# Patient Record
Sex: Male | Born: 1965
Health system: Southern US, Community
[De-identification: ages and names within clinical notes are randomized; demographics above are authoritative.]

## PROBLEM LIST (undated history)

## (undated) DIAGNOSIS — J449 Chronic obstructive pulmonary disease, unspecified: Secondary | ICD-10-CM

## (undated) DIAGNOSIS — I499 Cardiac arrhythmia, unspecified: Secondary | ICD-10-CM

## (undated) DIAGNOSIS — I10 Essential (primary) hypertension: Secondary | ICD-10-CM

## (undated) DIAGNOSIS — M545 Low back pain, unspecified: Secondary | ICD-10-CM

## (undated) DIAGNOSIS — E669 Obesity, unspecified: Secondary | ICD-10-CM

## (undated) DIAGNOSIS — N200 Calculus of kidney: Secondary | ICD-10-CM

## (undated) DIAGNOSIS — K219 Gastro-esophageal reflux disease without esophagitis: Secondary | ICD-10-CM

## (undated) DIAGNOSIS — M1990 Unspecified osteoarthritis, unspecified site: Secondary | ICD-10-CM

## (undated) DIAGNOSIS — E785 Hyperlipidemia, unspecified: Secondary | ICD-10-CM

## (undated) HISTORY — DX: Unspecified osteoarthritis, unspecified site: M19.90

## (undated) HISTORY — DX: Low back pain: M54.5

## (undated) HISTORY — DX: Cardiac arrhythmia, unspecified: I49.9

## (undated) HISTORY — PX: KNEE SURGERY: SHX244

## (undated) HISTORY — DX: Obesity, unspecified: E66.9

## (undated) HISTORY — DX: Gastro-esophageal reflux disease without esophagitis: K21.9

## (undated) HISTORY — DX: Calculus of kidney: N20.0

## (undated) HISTORY — DX: Hyperlipidemia, unspecified: E78.5

## (undated) HISTORY — DX: Essential (primary) hypertension: I10

## (undated) HISTORY — DX: Chronic obstructive pulmonary disease, unspecified: J44.9

## (undated) HISTORY — PX: OTHER SURGICAL HISTORY: SHX169

## (undated) HISTORY — DX: Low back pain, unspecified: M54.50

---

## 1999-03-01 ENCOUNTER — Emergency Department (HOSPITAL_COMMUNITY): Admission: EM | Admit: 1999-03-01 | Discharge: 1999-03-01 | Payer: Self-pay | Admitting: Emergency Medicine

## 2000-07-24 ENCOUNTER — Emergency Department (HOSPITAL_COMMUNITY): Admission: EM | Admit: 2000-07-24 | Discharge: 2000-07-24 | Payer: Self-pay | Admitting: Emergency Medicine

## 2000-07-24 ENCOUNTER — Encounter: Payer: Self-pay | Admitting: Emergency Medicine

## 2000-08-18 ENCOUNTER — Encounter: Payer: Self-pay | Admitting: Urology

## 2000-08-18 ENCOUNTER — Encounter: Admission: RE | Admit: 2000-08-18 | Discharge: 2000-08-18 | Payer: Self-pay | Admitting: Urology

## 2000-09-23 ENCOUNTER — Encounter: Payer: Self-pay | Admitting: Anesthesiology

## 2000-09-24 ENCOUNTER — Encounter: Payer: Self-pay | Admitting: Urology

## 2000-09-24 ENCOUNTER — Ambulatory Visit (HOSPITAL_COMMUNITY): Admission: RE | Admit: 2000-09-24 | Discharge: 2000-09-24 | Payer: Self-pay | Admitting: Urology

## 2000-09-27 ENCOUNTER — Encounter: Payer: Self-pay | Admitting: Urology

## 2000-09-27 ENCOUNTER — Ambulatory Visit (HOSPITAL_COMMUNITY): Admission: RE | Admit: 2000-09-27 | Discharge: 2000-09-27 | Payer: Self-pay | Admitting: Urology

## 2003-05-02 ENCOUNTER — Encounter
Admission: RE | Admit: 2003-05-02 | Discharge: 2003-07-31 | Payer: Self-pay | Admitting: Physical Medicine & Rehabilitation

## 2003-05-10 ENCOUNTER — Encounter: Payer: Self-pay | Admitting: Physical Medicine & Rehabilitation

## 2003-05-10 ENCOUNTER — Ambulatory Visit (HOSPITAL_COMMUNITY)
Admission: RE | Admit: 2003-05-10 | Discharge: 2003-05-10 | Payer: Self-pay | Admitting: Physical Medicine & Rehabilitation

## 2003-07-26 ENCOUNTER — Encounter
Admission: RE | Admit: 2003-07-26 | Discharge: 2003-10-24 | Payer: Self-pay | Admitting: Physical Medicine & Rehabilitation

## 2003-10-31 ENCOUNTER — Encounter
Admission: RE | Admit: 2003-10-31 | Discharge: 2004-01-29 | Payer: Self-pay | Admitting: Physical Medicine & Rehabilitation

## 2003-11-28 ENCOUNTER — Encounter
Admission: RE | Admit: 2003-11-28 | Discharge: 2004-02-26 | Payer: Self-pay | Admitting: Physical Medicine & Rehabilitation

## 2004-03-27 ENCOUNTER — Encounter
Admission: RE | Admit: 2004-03-27 | Discharge: 2004-05-10 | Payer: Self-pay | Admitting: Physical Medicine & Rehabilitation

## 2004-05-10 ENCOUNTER — Encounter
Admission: RE | Admit: 2004-05-10 | Discharge: 2004-07-15 | Payer: Self-pay | Admitting: Physical Medicine & Rehabilitation

## 2004-05-13 ENCOUNTER — Ambulatory Visit: Payer: Self-pay | Admitting: Physical Medicine & Rehabilitation

## 2004-07-15 ENCOUNTER — Encounter
Admission: RE | Admit: 2004-07-15 | Discharge: 2004-10-13 | Payer: Self-pay | Admitting: Physical Medicine & Rehabilitation

## 2004-07-16 ENCOUNTER — Ambulatory Visit: Payer: Self-pay | Admitting: Physical Medicine & Rehabilitation

## 2004-10-16 ENCOUNTER — Encounter
Admission: RE | Admit: 2004-10-16 | Discharge: 2004-11-27 | Payer: Self-pay | Admitting: Physical Medicine & Rehabilitation

## 2004-10-31 ENCOUNTER — Ambulatory Visit: Payer: Self-pay | Admitting: Physical Medicine & Rehabilitation

## 2004-11-27 ENCOUNTER — Encounter
Admission: RE | Admit: 2004-11-27 | Discharge: 2005-02-25 | Payer: Self-pay | Admitting: Physical Medicine & Rehabilitation

## 2005-02-21 ENCOUNTER — Encounter
Admission: RE | Admit: 2005-02-21 | Discharge: 2005-05-22 | Payer: Self-pay | Admitting: Physical Medicine & Rehabilitation

## 2005-03-10 ENCOUNTER — Ambulatory Visit: Payer: Self-pay | Admitting: Physical Medicine & Rehabilitation

## 2005-04-21 ENCOUNTER — Ambulatory Visit: Payer: Self-pay | Admitting: Physical Medicine & Rehabilitation

## 2005-06-02 ENCOUNTER — Emergency Department (HOSPITAL_COMMUNITY): Admission: EM | Admit: 2005-06-02 | Discharge: 2005-06-02 | Payer: Self-pay | Admitting: Emergency Medicine

## 2005-06-09 ENCOUNTER — Encounter: Admission: RE | Admit: 2005-06-09 | Discharge: 2005-06-09 | Payer: Self-pay | Admitting: Family Medicine

## 2005-06-12 ENCOUNTER — Inpatient Hospital Stay (HOSPITAL_COMMUNITY): Admission: EM | Admit: 2005-06-12 | Discharge: 2005-06-16 | Payer: Self-pay | Admitting: Emergency Medicine

## 2005-06-12 ENCOUNTER — Encounter: Admission: RE | Admit: 2005-06-12 | Discharge: 2005-06-12 | Payer: Self-pay | Admitting: Family Medicine

## 2005-06-13 ENCOUNTER — Ambulatory Visit: Payer: Self-pay | Admitting: Pulmonary Disease

## 2005-06-13 ENCOUNTER — Ambulatory Visit: Payer: Self-pay | Admitting: Internal Medicine

## 2005-06-13 ENCOUNTER — Encounter: Payer: Self-pay | Admitting: Internal Medicine

## 2005-07-21 ENCOUNTER — Ambulatory Visit: Payer: Self-pay | Admitting: Cardiology

## 2005-07-22 ENCOUNTER — Encounter: Admission: RE | Admit: 2005-07-22 | Discharge: 2005-07-22 | Payer: Self-pay

## 2005-07-30 ENCOUNTER — Ambulatory Visit: Payer: Self-pay

## 2005-07-31 ENCOUNTER — Ambulatory Visit: Payer: Self-pay | Admitting: Cardiology

## 2005-09-17 ENCOUNTER — Encounter
Admission: RE | Admit: 2005-09-17 | Discharge: 2005-12-16 | Payer: Self-pay | Admitting: Physical Medicine & Rehabilitation

## 2005-09-17 ENCOUNTER — Ambulatory Visit: Payer: Self-pay | Admitting: Physical Medicine & Rehabilitation

## 2005-10-27 ENCOUNTER — Ambulatory Visit: Payer: Self-pay | Admitting: Internal Medicine

## 2005-12-25 ENCOUNTER — Ambulatory Visit: Payer: Self-pay | Admitting: Internal Medicine

## 2006-02-20 ENCOUNTER — Ambulatory Visit: Payer: Self-pay | Admitting: Internal Medicine

## 2006-05-01 ENCOUNTER — Ambulatory Visit: Payer: Self-pay | Admitting: Internal Medicine

## 2006-05-06 ENCOUNTER — Ambulatory Visit: Payer: Self-pay | Admitting: Internal Medicine

## 2006-05-20 ENCOUNTER — Ambulatory Visit: Payer: Self-pay | Admitting: Internal Medicine

## 2006-05-25 ENCOUNTER — Emergency Department (HOSPITAL_COMMUNITY): Admission: EM | Admit: 2006-05-25 | Discharge: 2006-05-26 | Payer: Self-pay | Admitting: Emergency Medicine

## 2006-05-26 ENCOUNTER — Ambulatory Visit: Payer: Self-pay | Admitting: Gastroenterology

## 2006-06-01 ENCOUNTER — Ambulatory Visit: Payer: Self-pay | Admitting: Internal Medicine

## 2006-07-02 ENCOUNTER — Ambulatory Visit: Payer: Self-pay | Admitting: Internal Medicine

## 2006-07-06 ENCOUNTER — Ambulatory Visit: Payer: Self-pay | Admitting: Gastroenterology

## 2006-07-07 ENCOUNTER — Ambulatory Visit: Payer: Self-pay | Admitting: Gastroenterology

## 2006-07-08 ENCOUNTER — Ambulatory Visit: Payer: Self-pay | Admitting: Cardiology

## 2006-07-15 ENCOUNTER — Ambulatory Visit: Payer: Self-pay | Admitting: Gastroenterology

## 2006-07-17 ENCOUNTER — Ambulatory Visit: Payer: Self-pay | Admitting: Gastroenterology

## 2006-07-27 ENCOUNTER — Ambulatory Visit: Payer: Self-pay | Admitting: Gastroenterology

## 2006-08-04 ENCOUNTER — Ambulatory Visit: Payer: Self-pay | Admitting: Internal Medicine

## 2006-09-14 ENCOUNTER — Ambulatory Visit: Payer: Self-pay | Admitting: Internal Medicine

## 2006-10-16 ENCOUNTER — Ambulatory Visit: Payer: Self-pay | Admitting: Internal Medicine

## 2006-10-16 LAB — CONVERTED CEMR LAB
Glucose, Bld: 115 mg/dL — ABNORMAL HIGH (ref 70–99)
Pro B Natriuretic peptide (BNP): 15 pg/mL (ref 0.0–100.0)

## 2006-11-16 ENCOUNTER — Ambulatory Visit: Payer: Self-pay | Admitting: Internal Medicine

## 2006-11-25 ENCOUNTER — Ambulatory Visit: Payer: Self-pay | Admitting: Endocrinology

## 2007-01-27 ENCOUNTER — Ambulatory Visit: Payer: Self-pay | Admitting: Internal Medicine

## 2007-03-27 ENCOUNTER — Encounter: Payer: Self-pay | Admitting: Internal Medicine

## 2007-03-27 DIAGNOSIS — IMO0002 Reserved for concepts with insufficient information to code with codable children: Secondary | ICD-10-CM | POA: Insufficient documentation

## 2007-03-27 DIAGNOSIS — I10 Essential (primary) hypertension: Secondary | ICD-10-CM | POA: Insufficient documentation

## 2007-03-27 DIAGNOSIS — Z8719 Personal history of other diseases of the digestive system: Secondary | ICD-10-CM | POA: Insufficient documentation

## 2007-03-27 DIAGNOSIS — E785 Hyperlipidemia, unspecified: Secondary | ICD-10-CM | POA: Insufficient documentation

## 2007-03-27 DIAGNOSIS — E669 Obesity, unspecified: Secondary | ICD-10-CM | POA: Insufficient documentation

## 2007-04-14 ENCOUNTER — Ambulatory Visit: Payer: Self-pay | Admitting: Endocrinology

## 2007-04-15 ENCOUNTER — Ambulatory Visit: Payer: Self-pay | Admitting: Endocrinology

## 2007-04-21 ENCOUNTER — Ambulatory Visit: Payer: Self-pay | Admitting: Endocrinology

## 2007-05-12 ENCOUNTER — Ambulatory Visit: Payer: Self-pay | Admitting: Endocrinology

## 2007-05-20 ENCOUNTER — Ambulatory Visit: Payer: Self-pay | Admitting: Internal Medicine

## 2007-05-20 LAB — CONVERTED CEMR LAB
ALT: 37 units/L (ref 0–53)
AST: 30 units/L (ref 0–37)
Albumin: 3.9 g/dL (ref 3.5–5.2)
Alkaline Phosphatase: 85 units/L (ref 39–117)
BUN: 9 mg/dL (ref 6–23)
Basophils Absolute: 0 10*3/uL (ref 0.0–0.1)
Basophils Relative: 0.2 % (ref 0.0–1.0)
Bilirubin, Direct: 0.1 mg/dL (ref 0.0–0.3)
CO2: 33 meq/L — ABNORMAL HIGH (ref 19–32)
Calcium: 9.8 mg/dL (ref 8.4–10.5)
Chloride: 99 meq/L (ref 96–112)
Cholesterol: 175 mg/dL (ref 0–200)
Creatinine, Ser: 0.7 mg/dL (ref 0.4–1.5)
Direct LDL: 116.6 mg/dL
Eosinophils Absolute: 0.4 10*3/uL (ref 0.0–0.6)
Eosinophils Relative: 5.1 % — ABNORMAL HIGH (ref 0.0–5.0)
GFR calc Af Amer: 159 mL/min
GFR calc non Af Amer: 131 mL/min
Glucose, Bld: 94 mg/dL (ref 70–99)
HCT: 44.2 % (ref 39.0–52.0)
HDL: 37.5 mg/dL — ABNORMAL LOW (ref >39.0)
Hemoglobin: 15.2 g/dL (ref 13.0–17.0)
Hgb A1c MFr Bld: 10.5 % — ABNORMAL HIGH (ref 4.6–6.0)
Lymphocytes Relative: 33.8 % (ref 12.0–46.0)
MCHC: 34.4 g/dL (ref 30.0–36.0)
MCV: 90.8 fL (ref 78.0–100.0)
Monocytes Absolute: 0.9 10*3/uL — ABNORMAL HIGH (ref 0.2–0.7)
Monocytes Relative: 11.7 % — ABNORMAL HIGH (ref 3.0–11.0)
Neutro Abs: 3.7 10*3/uL (ref 1.4–7.7)
Neutrophils Relative %: 49.2 % (ref 43.0–77.0)
Platelets: 232 10*3/uL (ref 150–400)
Potassium: 4.1 meq/L (ref 3.5–5.1)
RBC: 4.87 M/uL (ref 4.22–5.81)
RDW: 11.6 % (ref 11.5–14.6)
Sodium: 139 meq/L (ref 135–145)
TSH: 3.19 microintl units/mL (ref 0.35–5.50)
Total Bilirubin: 0.5 mg/dL (ref 0.3–1.2)
Total CHOL/HDL Ratio: 4.7
Total Protein: 7.1 g/dL (ref 6.0–8.3)
Triglycerides: 205 mg/dL (ref 0–149)
VLDL: 41 mg/dL — ABNORMAL HIGH (ref 0–40)
WBC: 7.5 10*3/uL (ref 4.5–10.5)

## 2007-06-07 ENCOUNTER — Ambulatory Visit: Payer: Self-pay | Admitting: Endocrinology

## 2007-06-07 ENCOUNTER — Encounter: Payer: Self-pay | Admitting: Endocrinology

## 2007-06-07 DIAGNOSIS — E1129 Type 2 diabetes mellitus with other diabetic kidney complication: Secondary | ICD-10-CM

## 2007-06-07 DIAGNOSIS — E1165 Type 2 diabetes mellitus with hyperglycemia: Secondary | ICD-10-CM

## 2007-06-08 ENCOUNTER — Encounter: Payer: Self-pay | Admitting: Endocrinology

## 2007-06-08 ENCOUNTER — Ambulatory Visit: Payer: Self-pay | Admitting: Endocrinology

## 2007-06-11 ENCOUNTER — Ambulatory Visit: Payer: Self-pay | Admitting: Internal Medicine

## 2007-06-11 ENCOUNTER — Encounter: Payer: Self-pay | Admitting: Internal Medicine

## 2007-06-11 DIAGNOSIS — K219 Gastro-esophageal reflux disease without esophagitis: Secondary | ICD-10-CM | POA: Insufficient documentation

## 2007-06-11 DIAGNOSIS — M545 Low back pain: Secondary | ICD-10-CM

## 2007-06-11 DIAGNOSIS — M79605 Pain in left leg: Secondary | ICD-10-CM | POA: Insufficient documentation

## 2007-06-11 DIAGNOSIS — J189 Pneumonia, unspecified organism: Secondary | ICD-10-CM | POA: Insufficient documentation

## 2007-07-08 ENCOUNTER — Ambulatory Visit: Payer: Self-pay | Admitting: Endocrinology

## 2007-07-09 LAB — CONVERTED CEMR LAB
Basophils Absolute: 0 10*3/uL (ref 0.0–0.1)
Basophils Relative: 0.4 % (ref 0.0–1.0)
Eosinophils Absolute: 0.4 10*3/uL (ref 0.0–0.6)
Eosinophils Relative: 5.8 % — ABNORMAL HIGH (ref 0.0–5.0)
HCT: 42.1 % (ref 39.0–52.0)
Hemoglobin: 15.1 g/dL (ref 13.0–17.0)
Lymphocytes Relative: 41.9 % (ref 12.0–46.0)
MCHC: 35.8 g/dL (ref 30.0–36.0)
MCV: 89.3 fL (ref 78.0–100.0)
Monocytes Absolute: 0.6 10*3/uL (ref 0.2–0.7)
Monocytes Relative: 9.3 % (ref 3.0–11.0)
Neutro Abs: 2.9 10*3/uL (ref 1.4–7.7)
Neutrophils Relative %: 42.6 % — ABNORMAL LOW (ref 43.0–77.0)
Platelets: 246 10*3/uL (ref 150–400)
RBC: 4.72 M/uL (ref 4.22–5.81)
RDW: 12.4 % (ref 11.5–14.6)
WBC: 6.9 10*3/uL (ref 4.5–10.5)

## 2007-07-22 ENCOUNTER — Ambulatory Visit: Payer: Self-pay | Admitting: Internal Medicine

## 2007-07-22 DIAGNOSIS — F17200 Nicotine dependence, unspecified, uncomplicated: Secondary | ICD-10-CM | POA: Insufficient documentation

## 2007-08-11 ENCOUNTER — Encounter: Payer: Self-pay | Admitting: Internal Medicine

## 2007-08-31 ENCOUNTER — Telehealth (INDEPENDENT_AMBULATORY_CARE_PROVIDER_SITE_OTHER): Payer: Self-pay | Admitting: *Deleted

## 2007-09-03 ENCOUNTER — Ambulatory Visit: Payer: Self-pay | Admitting: Endocrinology

## 2007-09-08 ENCOUNTER — Ambulatory Visit: Payer: Self-pay | Admitting: Endocrinology

## 2007-09-09 LAB — CONVERTED CEMR LAB: TSH: 2.25 microintl units/mL (ref 0.35–5.50)

## 2007-09-16 ENCOUNTER — Telehealth: Payer: Self-pay | Admitting: Internal Medicine

## 2007-09-29 ENCOUNTER — Ambulatory Visit: Payer: Self-pay | Admitting: Internal Medicine

## 2007-09-29 DIAGNOSIS — J069 Acute upper respiratory infection, unspecified: Secondary | ICD-10-CM | POA: Insufficient documentation

## 2007-09-29 DIAGNOSIS — R197 Diarrhea, unspecified: Secondary | ICD-10-CM | POA: Insufficient documentation

## 2007-10-08 ENCOUNTER — Ambulatory Visit: Payer: Self-pay | Admitting: Endocrinology

## 2007-11-16 ENCOUNTER — Telehealth: Payer: Self-pay | Admitting: Internal Medicine

## 2007-11-23 ENCOUNTER — Ambulatory Visit: Payer: Self-pay | Admitting: Internal Medicine

## 2007-11-23 DIAGNOSIS — R0602 Shortness of breath: Secondary | ICD-10-CM | POA: Insufficient documentation

## 2007-11-23 DIAGNOSIS — R609 Edema, unspecified: Secondary | ICD-10-CM | POA: Insufficient documentation

## 2007-11-25 LAB — CONVERTED CEMR LAB
BUN: 8 mg/dL (ref 6–23)
CO2: 33 meq/L — ABNORMAL HIGH (ref 19–32)
Calcium: 8.7 mg/dL (ref 8.4–10.5)
Chloride: 99 meq/L (ref 96–112)
Creatinine, Ser: 0.8 mg/dL (ref 0.4–1.5)
GFR calc Af Amer: 136 mL/min
GFR calc non Af Amer: 112 mL/min
Glucose, Bld: 222 mg/dL — ABNORMAL HIGH (ref 70–99)
Potassium: 4 meq/L (ref 3.5–5.1)
Pro B Natriuretic peptide (BNP): 71 pg/mL (ref 0.0–100.0)
Sodium: 135 meq/L (ref 135–145)

## 2007-12-14 ENCOUNTER — Telehealth: Payer: Self-pay | Admitting: Internal Medicine

## 2007-12-27 ENCOUNTER — Ambulatory Visit: Payer: Self-pay | Admitting: Internal Medicine

## 2007-12-28 ENCOUNTER — Encounter: Payer: Self-pay | Admitting: Internal Medicine

## 2008-02-07 ENCOUNTER — Ambulatory Visit: Payer: Self-pay | Admitting: Endocrinology

## 2008-02-07 DIAGNOSIS — E291 Testicular hypofunction: Secondary | ICD-10-CM | POA: Insufficient documentation

## 2008-02-07 LAB — CONVERTED CEMR LAB
Hgb A1c MFr Bld: 7.7 % — ABNORMAL HIGH (ref 4.6–6.0)
Testosterone: 53.15 ng/dL — ABNORMAL LOW (ref 350.00–890)

## 2008-02-09 ENCOUNTER — Ambulatory Visit: Payer: Self-pay | Admitting: Endocrinology

## 2008-02-14 ENCOUNTER — Telehealth: Payer: Self-pay | Admitting: Internal Medicine

## 2008-02-15 ENCOUNTER — Telehealth: Payer: Self-pay | Admitting: Internal Medicine

## 2008-02-16 LAB — CONVERTED CEMR LAB
FSH: 3 milliintl units/mL
LH: 1 milliintl units/mL
Prolactin: 17.8 ng/mL

## 2008-03-06 ENCOUNTER — Telehealth (INDEPENDENT_AMBULATORY_CARE_PROVIDER_SITE_OTHER): Payer: Self-pay | Admitting: *Deleted

## 2008-03-06 ENCOUNTER — Ambulatory Visit: Payer: Self-pay | Admitting: Internal Medicine

## 2008-03-14 ENCOUNTER — Ambulatory Visit: Payer: Self-pay | Admitting: Endocrinology

## 2008-03-15 LAB — CONVERTED CEMR LAB: Testosterone: 252.93 ng/dL — ABNORMAL LOW (ref 350.00–890)

## 2008-03-16 ENCOUNTER — Ambulatory Visit: Payer: Self-pay | Admitting: Endocrinology

## 2008-04-05 ENCOUNTER — Ambulatory Visit: Payer: Self-pay | Admitting: Internal Medicine

## 2008-04-05 DIAGNOSIS — R05 Cough: Secondary | ICD-10-CM

## 2008-04-05 DIAGNOSIS — R059 Cough, unspecified: Secondary | ICD-10-CM | POA: Insufficient documentation

## 2008-04-05 LAB — CONVERTED CEMR LAB
BUN: 19 mg/dL (ref 6–23)
CO2: 31 meq/L (ref 19–32)
Calcium: 9.1 mg/dL (ref 8.4–10.5)
Chloride: 101 meq/L (ref 96–112)
Creatinine, Ser: 0.9 mg/dL (ref 0.4–1.5)
GFR calc Af Amer: 118 mL/min
GFR calc non Af Amer: 98 mL/min
Glucose, Bld: 275 mg/dL — ABNORMAL HIGH (ref 70–99)
Potassium: 4 meq/L (ref 3.5–5.1)
Pro B Natriuretic peptide (BNP): 39 pg/mL (ref 0.0–100.0)
Sodium: 139 meq/L (ref 135–145)

## 2008-04-26 ENCOUNTER — Ambulatory Visit: Payer: Self-pay | Admitting: Internal Medicine

## 2008-04-26 DIAGNOSIS — R404 Transient alteration of awareness: Secondary | ICD-10-CM | POA: Insufficient documentation

## 2008-04-26 DIAGNOSIS — F3289 Other specified depressive episodes: Secondary | ICD-10-CM | POA: Insufficient documentation

## 2008-04-26 DIAGNOSIS — F329 Major depressive disorder, single episode, unspecified: Secondary | ICD-10-CM

## 2008-05-09 ENCOUNTER — Encounter: Payer: Self-pay | Admitting: Internal Medicine

## 2008-05-09 ENCOUNTER — Ambulatory Visit: Payer: Self-pay | Admitting: Cardiology

## 2008-05-09 ENCOUNTER — Observation Stay (HOSPITAL_COMMUNITY): Admission: EM | Admit: 2008-05-09 | Discharge: 2008-05-11 | Payer: Self-pay | Admitting: Emergency Medicine

## 2008-05-09 ENCOUNTER — Ambulatory Visit: Payer: Self-pay | Admitting: Internal Medicine

## 2008-05-10 ENCOUNTER — Encounter: Payer: Self-pay | Admitting: Internal Medicine

## 2008-05-15 ENCOUNTER — Ambulatory Visit: Payer: Self-pay | Admitting: Endocrinology

## 2008-05-15 LAB — CONVERTED CEMR LAB
Hgb A1c MFr Bld: 9.4 % — ABNORMAL HIGH (ref 4.6–6.0)
Testosterone: 354.24 ng/dL (ref 350.00–890)

## 2008-05-16 ENCOUNTER — Telehealth: Payer: Self-pay | Admitting: Internal Medicine

## 2008-05-24 ENCOUNTER — Telehealth: Payer: Self-pay | Admitting: Internal Medicine

## 2008-05-25 ENCOUNTER — Telehealth: Payer: Self-pay | Admitting: Endocrinology

## 2008-06-15 ENCOUNTER — Telehealth: Payer: Self-pay | Admitting: Internal Medicine

## 2008-06-15 ENCOUNTER — Ambulatory Visit: Payer: Self-pay | Admitting: Endocrinology

## 2008-06-28 ENCOUNTER — Ambulatory Visit: Payer: Self-pay | Admitting: Internal Medicine

## 2008-06-28 DIAGNOSIS — R079 Chest pain, unspecified: Secondary | ICD-10-CM | POA: Insufficient documentation

## 2008-07-17 ENCOUNTER — Ambulatory Visit: Payer: Self-pay | Admitting: Endocrinology

## 2008-07-20 ENCOUNTER — Telehealth: Payer: Self-pay | Admitting: Internal Medicine

## 2008-08-07 ENCOUNTER — Telehealth: Payer: Self-pay | Admitting: Internal Medicine

## 2008-09-04 ENCOUNTER — Ambulatory Visit: Payer: Self-pay | Admitting: Internal Medicine

## 2008-09-21 ENCOUNTER — Ambulatory Visit: Payer: Self-pay | Admitting: Endocrinology

## 2008-11-14 ENCOUNTER — Telehealth: Payer: Self-pay | Admitting: Internal Medicine

## 2008-12-13 ENCOUNTER — Telehealth: Payer: Self-pay | Admitting: Internal Medicine

## 2008-12-18 ENCOUNTER — Ambulatory Visit: Payer: Self-pay | Admitting: Internal Medicine

## 2008-12-18 DIAGNOSIS — J209 Acute bronchitis, unspecified: Secondary | ICD-10-CM | POA: Insufficient documentation

## 2009-01-10 ENCOUNTER — Telehealth: Payer: Self-pay | Admitting: Internal Medicine

## 2009-02-12 ENCOUNTER — Telehealth: Payer: Self-pay | Admitting: Internal Medicine

## 2009-03-12 ENCOUNTER — Telehealth: Payer: Self-pay | Admitting: Internal Medicine

## 2009-04-09 ENCOUNTER — Ambulatory Visit: Payer: Self-pay | Admitting: Internal Medicine

## 2009-04-10 ENCOUNTER — Telehealth: Payer: Self-pay | Admitting: Internal Medicine

## 2009-04-11 LAB — CONVERTED CEMR LAB
ALT: 30 units/L (ref 0–53)
AST: 19 units/L (ref 0–37)
Albumin: 4.4 g/dL (ref 3.5–5.2)
Alkaline Phosphatase: 112 units/L (ref 39–117)
BUN: 15 mg/dL (ref 6–23)
Bilirubin, Direct: 0.1 mg/dL (ref 0.0–0.3)
CO2: 29 meq/L (ref 19–32)
Calcium: 9.4 mg/dL (ref 8.4–10.5)
Chloride: 98 meq/L (ref 96–112)
Creatinine, Ser: 0.9 mg/dL (ref 0.4–1.5)
GFR calc non Af Amer: 97.1 mL/min (ref >60)
Glucose, Bld: 433 mg/dL — ABNORMAL HIGH (ref 70–99)
Hgb A1c MFr Bld: 11.2 % — ABNORMAL HIGH (ref 4.6–6.5)
Potassium: 4.2 meq/L (ref 3.5–5.1)
Sodium: 136 meq/L (ref 135–145)
TSH: 4.34 microintl units/mL (ref 0.35–5.50)
Total Bilirubin: 0.8 mg/dL (ref 0.3–1.2)
Total Protein: 7.9 g/dL (ref 6.0–8.3)

## 2009-06-13 ENCOUNTER — Ambulatory Visit: Payer: Self-pay | Admitting: Internal Medicine

## 2009-07-09 ENCOUNTER — Telehealth: Payer: Self-pay | Admitting: Internal Medicine

## 2009-08-08 ENCOUNTER — Telehealth: Payer: Self-pay | Admitting: Internal Medicine

## 2009-09-02 ENCOUNTER — Emergency Department (HOSPITAL_COMMUNITY): Admission: EM | Admit: 2009-09-02 | Discharge: 2009-09-02 | Payer: Self-pay | Admitting: Emergency Medicine

## 2009-09-13 ENCOUNTER — Ambulatory Visit: Payer: Self-pay | Admitting: Internal Medicine

## 2009-10-04 ENCOUNTER — Telehealth: Payer: Self-pay | Admitting: Internal Medicine

## 2009-10-08 ENCOUNTER — Telehealth: Payer: Self-pay | Admitting: Internal Medicine

## 2009-11-06 ENCOUNTER — Telehealth: Payer: Self-pay | Admitting: Internal Medicine

## 2009-11-12 ENCOUNTER — Telehealth: Payer: Self-pay | Admitting: Internal Medicine

## 2009-12-04 ENCOUNTER — Telehealth: Payer: Self-pay | Admitting: Internal Medicine

## 2010-01-03 ENCOUNTER — Telehealth: Payer: Self-pay | Admitting: Internal Medicine

## 2010-04-01 ENCOUNTER — Ambulatory Visit: Payer: Self-pay | Admitting: Internal Medicine

## 2010-06-04 ENCOUNTER — Ambulatory Visit: Payer: Self-pay | Admitting: Internal Medicine

## 2010-06-04 ENCOUNTER — Encounter: Payer: Self-pay | Admitting: Internal Medicine

## 2010-06-04 ENCOUNTER — Telehealth: Payer: Self-pay | Admitting: Internal Medicine

## 2010-06-04 DIAGNOSIS — R9431 Abnormal electrocardiogram [ECG] [EKG]: Secondary | ICD-10-CM | POA: Insufficient documentation

## 2010-06-04 LAB — CONVERTED CEMR LAB: Hgb A1c MFr Bld: 11.5 % — ABNORMAL HIGH (ref 4.6–6.5)

## 2010-06-05 LAB — CONVERTED CEMR LAB
ALT: 28 units/L (ref 0–53)
AST: 28 units/L (ref 0–37)
Albumin: 4.2 g/dL (ref 3.5–5.2)
Alkaline Phosphatase: 87 units/L (ref 39–117)
BUN: 13 mg/dL (ref 6–23)
Basophils Absolute: 0.1 10*3/uL (ref 0.0–0.1)
Basophils Relative: 0.6 % (ref 0.0–3.0)
Bilirubin Urine: NEGATIVE
Bilirubin, Direct: 0.1 mg/dL (ref 0.0–0.3)
CO2: 28 meq/L (ref 19–32)
Calcium: 9.7 mg/dL (ref 8.4–10.5)
Chloride: 98 meq/L (ref 96–112)
Cholesterol: 187 mg/dL (ref 0–200)
Creatinine, Ser: 0.9 mg/dL (ref 0.4–1.5)
Eosinophils Absolute: 0.2 10*3/uL (ref 0.0–0.7)
Eosinophils Relative: 1.8 % (ref 0.0–5.0)
GFR calc non Af Amer: 103.19 mL/min (ref >60)
Glucose, Bld: 238 mg/dL — ABNORMAL HIGH (ref 70–99)
HCT: 44.2 % (ref 39.0–52.0)
HDL: 47.8 mg/dL (ref >39.00)
Hemoglobin, Urine: NEGATIVE
Hemoglobin: 15.5 g/dL (ref 13.0–17.0)
Ketones, ur: NEGATIVE mg/dL
LDL Cholesterol: 103 mg/dL — ABNORMAL HIGH (ref 0–99)
Leukocytes, UA: NEGATIVE
Lymphocytes Relative: 36.9 % (ref 12.0–46.0)
Lymphs Abs: 3.8 10*3/uL (ref 0.7–4.0)
MCHC: 35.1 g/dL (ref 30.0–36.0)
MCV: 91 fL (ref 78.0–100.0)
Monocytes Absolute: 0.7 10*3/uL (ref 0.1–1.0)
Monocytes Relative: 7 % (ref 3.0–12.0)
Neutro Abs: 5.6 10*3/uL (ref 1.4–7.7)
Neutrophils Relative %: 53.7 % (ref 43.0–77.0)
Nitrite: NEGATIVE
PSA: 0.2 ng/mL (ref 0.10–4.00)
Platelets: 235 10*3/uL (ref 150.0–400.0)
Potassium: 4.4 meq/L (ref 3.5–5.1)
RBC: 4.85 M/uL (ref 4.22–5.81)
RDW: 13.2 % (ref 11.5–14.6)
Sodium: 133 meq/L — ABNORMAL LOW (ref 135–145)
Specific Gravity, Urine: 1.025 (ref 1.000–1.030)
TSH: 3.73 microintl units/mL (ref 0.35–5.50)
Total Bilirubin: 0.5 mg/dL (ref 0.3–1.2)
Total CHOL/HDL Ratio: 4
Total Protein, Urine: NEGATIVE mg/dL
Total Protein: 7.3 g/dL (ref 6.0–8.3)
Triglycerides: 179 mg/dL — ABNORMAL HIGH (ref 0.0–149.0)
Urine Glucose: >=1000 mg/dL
Urobilinogen, UA: 0.2 (ref 0.0–1.0)
VLDL: 35.8 mg/dL (ref 0.0–40.0)
WBC: 10.3 10*3/uL (ref 4.5–10.5)
pH: 5.5 (ref 5.0–8.0)

## 2010-06-18 ENCOUNTER — Telehealth (INDEPENDENT_AMBULATORY_CARE_PROVIDER_SITE_OTHER): Payer: Self-pay | Admitting: *Deleted

## 2010-06-19 ENCOUNTER — Encounter (HOSPITAL_COMMUNITY): Admission: RE | Admit: 2010-06-19 | Payer: Self-pay | Admitting: Internal Medicine

## 2010-06-24 ENCOUNTER — Telehealth (INDEPENDENT_AMBULATORY_CARE_PROVIDER_SITE_OTHER): Payer: Self-pay

## 2010-06-25 ENCOUNTER — Encounter: Payer: Self-pay | Admitting: Cardiovascular Disease

## 2010-06-25 ENCOUNTER — Encounter (HOSPITAL_COMMUNITY)
Admission: RE | Admit: 2010-06-25 | Discharge: 2010-08-31 | Payer: Self-pay | Source: Home / Self Care | Attending: Internal Medicine | Admitting: Internal Medicine

## 2010-06-25 ENCOUNTER — Ambulatory Visit: Payer: Self-pay

## 2010-06-25 ENCOUNTER — Ambulatory Visit: Payer: Self-pay | Admitting: Cardiovascular Disease

## 2010-07-01 ENCOUNTER — Telehealth: Payer: Self-pay | Admitting: Internal Medicine

## 2010-07-05 ENCOUNTER — Ambulatory Visit: Payer: Self-pay | Admitting: Internal Medicine

## 2010-07-05 DIAGNOSIS — N478 Other disorders of prepuce: Secondary | ICD-10-CM | POA: Insufficient documentation

## 2010-07-05 DIAGNOSIS — N471 Phimosis: Secondary | ICD-10-CM | POA: Insufficient documentation

## 2010-07-05 DIAGNOSIS — N476 Balanoposthitis: Secondary | ICD-10-CM | POA: Insufficient documentation

## 2010-07-05 LAB — CONVERTED CEMR LAB: Glucose, Bld: 192 mg/dL

## 2010-07-16 ENCOUNTER — Encounter (INDEPENDENT_AMBULATORY_CARE_PROVIDER_SITE_OTHER): Payer: Self-pay | Admitting: *Deleted

## 2010-07-30 ENCOUNTER — Telehealth: Payer: Self-pay | Admitting: Internal Medicine

## 2010-08-05 ENCOUNTER — Encounter: Payer: Self-pay | Admitting: Internal Medicine

## 2010-08-16 ENCOUNTER — Telehealth: Payer: Self-pay | Admitting: Internal Medicine

## 2010-09-03 ENCOUNTER — Telehealth: Payer: Self-pay | Admitting: Internal Medicine

## 2010-09-04 ENCOUNTER — Ambulatory Visit: Admit: 2010-09-04 | Payer: Self-pay | Admitting: Internal Medicine

## 2010-09-11 ENCOUNTER — Ambulatory Visit
Admission: RE | Admit: 2010-09-11 | Discharge: 2010-09-11 | Payer: Self-pay | Source: Home / Self Care | Attending: Internal Medicine | Admitting: Internal Medicine

## 2010-09-11 ENCOUNTER — Other Ambulatory Visit: Payer: Self-pay | Admitting: Internal Medicine

## 2010-09-11 DIAGNOSIS — N529 Male erectile dysfunction, unspecified: Secondary | ICD-10-CM | POA: Insufficient documentation

## 2010-09-11 LAB — BASIC METABOLIC PANEL
BUN: 19 mg/dL (ref 6–23)
CO2: 28 mEq/L (ref 19–32)
Calcium: 9.2 mg/dL (ref 8.4–10.5)
Chloride: 99 mEq/L (ref 96–112)
Creatinine, Ser: 0.7 mg/dL (ref 0.4–1.5)
GFR: 133.34 mL/min (ref >60.00)
Glucose, Bld: 155 mg/dL — ABNORMAL HIGH (ref 70–99)
Potassium: 4.2 mEq/L (ref 3.5–5.1)
Sodium: 135 mEq/L (ref 135–145)

## 2010-09-11 LAB — HEMOGLOBIN A1C: Hgb A1c MFr Bld: 12.9 % — ABNORMAL HIGH (ref 4.6–6.5)

## 2010-09-29 LAB — CONVERTED CEMR LAB
BUN: 12 mg/dL (ref 6–23)
CO2: 31 meq/L (ref 19–32)
Calcium: 9.5 mg/dL (ref 8.4–10.5)
Chloride: 100 meq/L (ref 96–112)
Creatinine, Ser: 1 mg/dL (ref 0.4–1.5)
GFR calc Af Amer: 104 mL/min
GFR calc non Af Amer: 86 mL/min
Glucose, Bld: 332 mg/dL — ABNORMAL HIGH (ref 70–99)
Hgb A1c MFr Bld: 8.1 % — ABNORMAL HIGH (ref 4.6–6.0)
Potassium: 4.5 meq/L (ref 3.5–5.1)
Sodium: 138 meq/L (ref 135–145)
TSH: 1.62 microintl units/mL (ref 0.35–5.50)

## 2010-10-01 NOTE — Progress Notes (Signed)
Summary: Critical lab  Phone Note Other Incoming   Caller: [facility]-Susan Summary of Call: urine glucose greater than or equal to 1,000. Initial call taken by: Lanier Prude, [facility]),  June 04, 2010 4:51 PM  Follow-up for Phone Call        noted Follow-up by: Tresa Garter MD,  June 04, 2010 10:11 PM

## 2010-10-01 NOTE — Letter (Signed)
Summary: Primary Care Appointment Letter  [facility]  [address] [place], [place] [address]   Phone: [phone]  Fax: [phone]    07/16/2010 MRN: [id number]  [name] [name] [address] [place], [place]  [address]  Dear [name] [name],   Your Primary Care Physician Tresa Garter MD has indicated that:    _______it is time to schedule an appointment.    _______you missed your appointment on______ and need to call and          reschedule.    _______you need to have lab work done.    _______you need to schedule an appointment discuss lab or test results.    ___X____you need to call to schedule an appointment with Dr Romero Belling at Dr Plotnikov's request. Please call [phone] and select option#1 to scheduel appt.     Please call our office as soon as possible.Please press option 1. Our office is open 8a-12noon and 1p-5p, Monday through Friday.     Thank you,    [facility]

## 2010-10-01 NOTE — Consult Note (Signed)
Summary: [facility]  [facility]   Imported By: Lester Elwood 08/09/2010 10:07:45  _____________________________________________________________________  External Attachment:    Type:   Image     Comment:   External Document

## 2010-10-01 NOTE — Progress Notes (Signed)
  Phone Note Call from Patient   Summary of Call: Patient is requesting refill of chantix. ([facility]) Initial call taken by: Lamar Sprinkles, CMA,  November 12, 2009 3:51 PM  Follow-up for Phone Call        done escript Follow-up by: Corwin Levins MD,  November 12, 2009 5:49 PM  Additional Follow-up for Phone Call Additional follow up Details #1::        Pt informed  Additional Follow-up by: Lamar Sprinkles, CMA,  November 12, 2009 6:07 PM    New/Updated Medications: CHANTIX STARTING MONTH PAK 0.5 MG X 11 & 1 MG X 42 TABS (VARENICLINE TARTRATE) use asd 1 by mouth once daily CHANTIX CONTINUING MONTH PAK 1 MG TABS (VARENICLINE TARTRATE) use asd 1 by mouth once daily Prescriptions: CHANTIX CONTINUING MONTH PAK 1 MG TABS (VARENICLINE TARTRATE) use asd 1 by mouth once daily  #30 x 1   Entered and Authorized by:   Corwin Levins MD   Signed by:   Corwin Levins MD on 11/12/2009   Method used:   Electronically to        [facility]* (retail)       [address] [place], [place]  [address]       Ph: [phone] or [phone]       Fax: [phone]   RxID:   [id number] CHANTIX STARTING MONTH PAK 0.5 MG X 11 & 1 MG X 42 TABS (VARENICLINE TARTRATE) use asd 1 by mouth once daily  #30 x 0   Entered and Authorized by:   Corwin Levins MD   Signed by:   Corwin Levins MD on 11/12/2009   Method used:   Electronically to        [facility]* (retail)       [address]       [place], [place]  [address]       Ph: [phone] or [phone]       Fax: [phone]   RxID:   [id number]

## 2010-10-01 NOTE — Progress Notes (Signed)
Summary: Morphine REFILL  Phone Note Refill Request   Refills Requested: Medication #1:  MORPHINE SULFATE CR 100 MG XR12H-TAB 2 by mouth three times a day Fill on or after 10/13/09  Medication #2:  MORPHINE SULFATE 15 MG TABS 1 by mouth two times a day as needed pain breakthrough. Initial call taken by: Lamar Sprinkles, CMA,  November 06, 2009 5:30 PM  Follow-up for Phone Call        ok to ref Follow-up by: Tresa Garter MD,  November 06, 2009 10:23 PM  Additional Follow-up for Phone Call Additional follow up Details #1::        pt's wife will be here for o/v to pick up Additional Follow-up by: Sydell Axon,  November 07, 2009 8:14 AM    New/Updated Medications: MORPHINE SULFATE CR 100 MG XR12H-TAB (MORPHINE SULFATE) 2 by mouth three times a day Fill on or after 11/10/09 Prescriptions: MORPHINE SULFATE 15 MG TABS (MORPHINE SULFATE) 1 by mouth two times a day as needed pain breakthrough  #60 x 0   Entered by:   Lamar Sprinkles, CMA   Authorized by:   Tresa Garter MD   Signed by:   Lamar Sprinkles, CMA on 11/07/2009   Method used:   Print then Give to Patient   RxID:   [id number] MORPHINE SULFATE CR 100 MG XR12H-TAB (MORPHINE SULFATE) 2 by mouth three times a day Fill on or after 11/10/09  #180 x 0   Entered by:   Lamar Sprinkles, CMA   Authorized by:   Tresa Garter MD   Signed by:   Lamar Sprinkles, CMA on 11/07/2009   Method used:   Print then Give to Patient   RxID:   [id number]

## 2010-10-01 NOTE — Progress Notes (Signed)
Summary: Viagra   Phone Note Call from Patient Call back at Home Phone [phone]   Caller: Patient Summary of Call: pt has tried Cialis and Levitra.  They helped but were too expensive.  He is requesting a larger quantity of Viagra.  Please advise Initial call taken by: Lanier Prude, [facility]),  July 01, 2010 5:01 PM  Follow-up for Phone Call        ok Follow-up by: Tresa Garter MD,  July 01, 2010 9:13 PM  Additional Follow-up for Phone Call Additional follow up Details #1::        pt's wife informed( ok'd per pt) Additional Follow-up by: Lanier Prude, [facility]),  July 02, 2010 9:50 AM    New/Updated Medications: VIAGRA 100 MG TABS (SILDENAFIL CITRATE) 1 by mouth once daily prn Prescriptions: VIAGRA 100 MG TABS (SILDENAFIL CITRATE) 1 by mouth once daily prn  #12 x 6   Entered by:   Lanier Prude, CMA(AAMA)   Authorized by:   Tresa Garter MD   Signed by:   Lanier Prude, CMA(AAMA) on 07/02/2010   Method used:   F[facility]ed to ...       [facility]       [address]       [place], [place]  [address]       Ph: [phone] or [phone]       Fax: [phone]   RxID:   [id number]

## 2010-10-01 NOTE — Miscellaneous (Signed)
Summary: Appointment Canceled  Appointment status changed to canceled by [employer] on 06/19/2010 12:29 PM.  Cancellation Comments --------------------- CRS/DX:ABN EKG/WT:236/INS:BCBS/DR PLOTNIKOV  Appointment Information ----------------------- Appt Type:  CARDIOLOGY NUCLEAR TESTING      Date:  Wednesday, June 19, 2010      Time:  12:00 PM for 15 min   Urgency:  Routine   Made By:  Pearson Grippe  To Visit:  [id number]    Reason:  CRS/DX:ABN EKG/WT:236/INS:BCBS/DR PLOTNIKOV  Appt Comments ------------- -- 06/19/10 12:29: (CEMR) CANCELED -- CRS/DX:ABN EKG/WT:236/INS:BCBS/DR PLOTNIKOV -- 06/19/10 12:01: (CEMR) ARRIVED -- CRS/DX:ABN EKG/WT:236/INS:BCBS/DR PLOTNIKOV -- 06/12/10 16:57: ([facility]EM[facility]) BOOKED -- Routine CARDIOLOGY NUCLEAR TESTING at 06/19/2010 12

## 2010-10-01 NOTE — Assessment & Plan Note (Signed)
Summary: medication check per wife/#/cd   Vital Signs:  Patient profile:   45 year old male Height:      70 inches Weight:      245 pounds BMI:     35.28 O2 Sat:      96 % on Room air Temp:     98.2 degrees F oral Pulse rate:   78 / minute Pulse rhythm:   regular Resp:     16 per minute BP sitting:   108 / 80  (left arm) Cuff size:   large  Vitals Entered By: Lanier Prude, CMA(AAMA) (April 01, 2010 3:07 PM)  O2 Flow:  Room air CC: f/u Is Patient Diabetic? Yes Comments needs RF on Morphine Sulfate CR 100mg  , Morphine SUlfate15mg  and Lovastatin 20mg .   CC:  f/u.  History of Present Illness: The patient presents for a follow up of LBP, hypertension, diabetes, hyperlipidemia   Current Medications (verified): 1)  Morphine Sulfate Cr 100 Mg Xr12h-Tab (Morphine Sulfate) .... 2 By Mouth Three Times A Day Fill On or After 03/09/10 2)  Morphine Sulfate 15 Mg Tabs (Morphine Sulfate) .Marland Kitchen.. 1 By Mouth Two Times A Day As Needed Pain Breakthrough Fill On or After 03/05/2010 3)  Aspirin 325 Mg Tabs (Aspirin) .Marland Kitchen.. 1 Qd 4)  Vitamin D3 1000 Unit  Tabs (Cholecalciferol) .Marland Kitchen.. 1 Qd 5)  Klor-Con M20 20 Meq Tbcr (Potassium Chloride Crys Cr) .Marland Kitchen.. 1 By Mouth Once Daily Prn 6)  Lovastatin 20 Mg Tabs (Lovastatin) .Marland Kitchen.. 1 Qd 7)  Humalog Mix 75/25 Kwikpen 75-25 %  Susp (Insulin Lispro Prot & Lispro) .... 60 Units Every Morning, and 50 in The Evening 8)  Promethazine Hcl 25 Mg  Tabs (Promethazine Hcl) .Marland Kitchen.. 1 By Mouth Qid As Needed Nausea 9)  Clomid 50 Mg  Tabs (Clomiphene Citrate) .... 1/4 Tab Qd 10)  Levitra 20 Mg  Tabs (Vardenafil Hcl) .... As Needed Use 11)  Demadex 100 Mg  Tabs (Torsemide) .Marland Kitchen.. 1 By Mouth Qam 12)  Azor 10-40 Mg  Tabs (Amlodipine-Olmesartan) .... Take 1 Tab By Mouth Every Day 13)  Bd U/f Mini Pen Needle 31g X 5 Mm Misc (Insulin Pen Needle) .... Use As Directed 14)  Onetouch Ultra Test  Strp (Glucose Blood) .... Use Two Times A Day 15)  Onetouch Ultrasoft Lancets  Misc (Lancets)  .... Use As Directed 16)  Chantix Continuing Month Pak 1 Mg Tabs (Varenicline Tartrate) .... Use Asd 1 By Mouth Once Daily 17)  Coreg 25 Mg Tabs (Carvedilol) .Marland Kitchen.. 1po Bid 18)  Bystolic 10 Mg Tabs (Nebivolol Hcl)  Allergies (verified): 1)  ! Nsaids 2)  Lotensin (Benazepril Hcl)  Past History:  Past Medical History: Last updated: 06/11/2007 Diabetes mellitus, type II Hyperlipidemia Hypertension Low back pain GERD  Family History: Last updated: 07/22/2007 Family History of CAD Male 1st degree relative <60  Social History: Last updated: 11/23/2007 Occupation: plumber Married Current Smoker 2 ppd  Past Surgical History: Denies surgical history  Review of Systems       The patient complains of dyspnea on exertion and difficulty walking.  The patient denies anorexia, fever, weight loss, weight gain, vision loss, decreased hearing, hoarseness, chest pain, syncope, peripheral edema, prolonged cough, headaches, hemoptysis, abdominal pain, melena, hematochezia, severe indigestion/heartburn, hematuria, incontinence, genital sores, muscle weakness, suspicious skin lesions, transient blindness, depression, unusual weight change, abnormal bleeding, enlarged lymph nodes, angioedema, breast masses, and testicular masses.    Physical Exam  General:  NAD, obese.   Eyes:  No corneal or conjunctival inflammation noted. EOMI. Perrla. Nose:  Erythematous throat mucosa and intranasal erythema.  Mouth:  Oral mucosa and oropharynx without lesions or exudates.  Teeth in good repair. Neck:  no masses, thyromegaly, or abnormal cervical nodes Lungs:  No B rhonchi, CTA Heart:  RRR, tachy Abdomen:  soft and non-tender.   Msk:  Lumbar-sacral spine is tender to palpation over paraspinal muscles and painfull with the ROM  Neurologic:  sensation is intact to touch on the feet  Skin:  insulin injection sites at anterior abdomen are normal  Psych:  alert and cooperative; normal mood and affect;  normal attention span and concentration   Impression & Recommendations:  Problem # 1:  DIABETES MELLITUS, TYPE II (ICD-250.00) Assessment Unchanged  His updated medication list for this problem includes:    Aspirin 325 Mg Tabs (Aspirin) .Marland Kitchen... 1 qd    Humalog Mix 75/25 Kwikpen 75-25 % Susp (Insulin lispro prot & lispro) .Marland KitchenMarland KitchenMarland KitchenMarland Kitchen 60 units every morning, and 50 in the evening    Azor 10-40 Mg Tabs (Amlodipine-olmesartan) .Marland Kitchen... Take 1 tab by mouth every day  Labs Reviewed: Creat: 0.9 (04/09/2009)    Reviewed HgBA1c results: 11.2 (04/09/2009)  8.1 (09/21/2008)  Problem # 2:  DEPRESSION (ICD-311) Assessment: Unchanged  Problem # 3:  LOW BACK PAIN (ICD-724.2) Assessment: Unchanged  His updated medication list for this problem includes:    Morphine Sulfate Cr 100 Mg Xr12h-tab (Morphine sulfate) .Marland Kitchen... 2 by mouth three times a day fill on or after 05/10/10    Morphine Sulfate 15 Mg Tabs (Morphine sulfate) .Marland Kitchen... 1 by mouth two times a day as needed pain breakthrough fill on or after 05/06/2010    Aspirin 325 Mg Tabs (Aspirin) .Marland Kitchen... 1 qd  Problem # 4:  HYPERLIPIDEMIA (ICD-272.4) Assessment: Unchanged  His updated medication list for this problem includes:    Lovastatin 20 Mg Tabs (Lovastatin) .Marland Kitchen... 1 qd  Problem # 5:  TOBACCO USE DISORDER/SMOKER-SMOKING CESSATION DISCUSSED (ICD-305.1) Assessment: Unchanged  His updated medication list for this problem includes:    Chantix Continuing Month Pak 1 Mg Tabs (Varenicline tartrate) ..... Use asd 1 by mouth once daily  Encouraged smoking cessation and discussed different methods for smoking cessation.   Complete Medication List: 1)  Morphine Sulfate Cr 100 Mg Xr12h-tab (Morphine sulfate) .... 2 by mouth three times a day fill on or after 05/10/10 2)  Morphine Sulfate 15 Mg Tabs (Morphine sulfate) .Marland Kitchen.. 1 by mouth two times a day as needed pain breakthrough fill on or after 05/06/2010 3)  Aspirin 325 Mg Tabs (Aspirin) .Marland Kitchen.. 1 qd 4)  Vitamin D3  1000 Unit Tabs (Cholecalciferol) .Marland Kitchen.. 1 qd 5)  Klor-con M20 20 Meq Tbcr (Potassium chloride crys cr) .Marland Kitchen.. 1 by mouth once daily prn 6)  Lovastatin 20 Mg Tabs (Lovastatin) .Marland Kitchen.. 1 qd 7)  Humalog Mix 75/25 Kwikpen 75-25 % Susp (Insulin lispro prot & lispro) .... 60 units every morning, and 50 in the evening 8)  Promethazine Hcl 25 Mg Tabs (Promethazine hcl) .Marland Kitchen.. 1 by mouth qid as needed nausea 9)  Clomid 50 Mg Tabs (Clomiphene citrate) .... 1/4 tab qd 10)  Levitra 20 Mg Tabs (Vardenafil hcl) .... As needed use 11)  Demadex 100 Mg Tabs (Torsemide) .Marland Kitchen.. 1 by mouth qam 12)  Azor 10-40 Mg Tabs (Amlodipine-olmesartan) .... Take 1 tab by mouth every day 13)  Bd U/f Mini Pen Needle 31g X 5 Mm Misc (Insulin pen needle) .... Use as directed 14)  Onetouch Ultra Test  Strp (Glucose blood) .... Use two times a day 15)  Onetouch Ultrasoft Lancets Misc (Lancets) .... Use as directed 16)  Chantix Continuing Month Pak 1 Mg Tabs (Varenicline tartrate) .... Use asd 1 by mouth once daily 17)  Coreg 25 Mg Tabs (Carvedilol) .Marland Kitchen.. 1po bid 18)  Bystolic 10 Mg Tabs (Nebivolol hcl)  Patient Instructions: 1)  Please schedule a follow-up appointment in 2 months well w/labs. 2)  HbgA1C prior to visit, ICD-9: 250.00 Vit B12 782.0 Vit D  Prescriptions: MORPHINE SULFATE 15 MG TABS (MORPHINE SULFATE) 1 by mouth two times a day as needed pain breakthrough fill on or after 05/06/2010  #60 x 0   Entered and Authorized by:   Tresa Garter MD   Signed by:   Tresa Garter MD on 04/01/2010   Method used:   Print then Give to Patient   RxID:   [id number] MORPHINE SULFATE CR 100 MG XR12H-TAB (MORPHINE SULFATE) 2 by mouth three times a day Fill on or after 05/10/10  #180 x 0   Entered and Authorized by:   Tresa Garter MD   Signed by:   Tresa Garter MD on 04/01/2010   Method used:   Print then Give to Patient   RxID:   [id number] MORPHINE SULFATE 15 MG TABS (MORPHINE SULFATE) 1 by mouth two times  a day as needed pain breakthrough fill on or after 04/05/2010  #60 x 0   Entered and Authorized by:   Tresa Garter MD   Signed by:   Tresa Garter MD on 04/01/2010   Method used:   Print then Give to Patient   RxID:   [id number] MORPHINE SULFATE CR 100 MG XR12H-TAB (MORPHINE SULFATE) 2 by mouth three times a day Fill on or after 04/09/10  #180 x 0   Entered and Authorized by:   Tresa Garter MD   Signed by:   Tresa Garter MD on 04/01/2010   Method used:   Print then Give to Patient   RxID:   [id number]

## 2010-10-01 NOTE — Assessment & Plan Note (Signed)
Summary: DR AVP NO SLOT-RASH BURN [name] [name]  [facility]   Vital Signs:  Patient profile:   45 year old male Height:      70 inches Weight:      233 pounds O2 Sat:      98 % on Room air Temp:     98.2 degrees F oral Pulse rate:   72 / minute Pulse rhythm:   regular Resp:     16 per minute BP sitting:   104 / 80  (left arm)  Vitals Entered By: Rock Nephew CMA (July 05, 2010 3:57 PM)  O2 Flow:  Room air CC: Patient c/o torn, painful foreskin when urinating  Does patient need assistance? Functional Status Self care Ambulation Normal   Primary Care Provider:  Georgina Quint Plotnikov MD  CC:  Patient c/o torn and painful foreskin when urinating.  History of Present Illness: New to me he complains of a problem with his foreskin for about two months with swelling, cracking, redness, and now he can't retract the foreskin.  Preventive Screening-Counseling & Management  Alcohol-Tobacco     Smoking Status: current     Smoking Cessation Counseling: yes     Packs/Day: 2.0     Tobacco Counseling: to quit use of tobacco products  Caffeine-Diet-Exercise     Does Patient Exercise: no  Hep-HIV-STD-Contraception     Hepatitis Risk: no risk noted     HIV Risk: no risk noted     STD Risk: no risk noted      Sexual History:  currently monogamous.        Drug Use:  no.        Blood Transfusions:  no.    Clinical Review Panels:  Prevention   Last PSA:  0.20 (06/04/2010)  Immunizations   Last Tetanus Booster:  Tdap (06/04/2010)   Last Flu Vaccine:  Fluvax 3+ (06/04/2010)   Last Pneumovax:  Historical (05/02/1998)  Lipid Management   Cholesterol:  187 (06/04/2010)   LDL (bad choesterol):  103 (06/04/2010)   HDL (good cholesterol):  47.80 (06/04/2010)  Diabetes Management   HgBA1C:  11.5 (06/04/2010)   Creatinine:  0.9 (06/04/2010)   Last Flu Vaccine:  Fluvax 3+ (06/04/2010)   Last Pneumovax:  Historical (05/02/1998)  CBC   WBC:  10.3 (06/04/2010)   RBC:  4.85  (06/04/2010)   Hgb:  15.5 (06/04/2010)   Hct:  44.2 (06/04/2010)   Platelets:  235.0 (06/04/2010)   MCV  91.0 (06/04/2010)   MCHC  35.1 (06/04/2010)   RDW  13.2 (06/04/2010)   PMN:  53.7 (06/04/2010)   Lymphs:  36.9 (06/04/2010)   Monos:  7.0 (06/04/2010)   Eosinophils:  1.8 (06/04/2010)   Basophil:  0.6 (06/04/2010)  Complete Metabolic Panel   Glucose:  192 (07/05/2010)   Sodium:  133 (06/04/2010)   Potassium:  4.4 (06/04/2010)   Chloride:  98 (06/04/2010)   CO2:  28 (06/04/2010)   BUN:  13 (06/04/2010)   Creatinine:  0.9 (06/04/2010)   Albumin:  4.2 (06/04/2010)   Total Protein:  7.3 (06/04/2010)   Calcium:  9.7 (06/04/2010)   Total Bili:  0.5 (06/04/2010)   Alk Phos:  87 (06/04/2010)   SGPT (ALT):  28 (06/04/2010)   SGOT (AST):  28 (06/04/2010)   Medications Prior to Update: 1)  Morphine Sulfate Cr 100 Mg Xr12h-Tab (Morphine Sulfate) .... 2 By Mouth Three Times A Day Fill On or After 07/10/10 2)  Morphine Sulfate 15 Mg Tabs (Morphine Sulfate) .Marland KitchenMarland KitchenMarland Kitchen  1 By Mouth Two Times A Day As Needed Pain Breakthrough Fill On or After 07/06/2010 3)  Aspirin 325 Mg Tabs (Aspirin) .Marland Kitchen.. 1 Qd 4)  Vitamin D3 1000 Unit  Tabs (Cholecalciferol) .Marland Kitchen.. 1 Qd 5)  Klor-Con M20 20 Meq Tbcr (Potassium Chloride Crys Cr) .Marland Kitchen.. 1 By Mouth Once Daily Prn 6)  Lovastatin 20 Mg Tabs (Lovastatin) .Marland Kitchen.. 1 Qd 7)  Humalog Mix 75/25 Kwikpen 75-25 %  Susp (Insulin Lispro Prot & Lispro) .... 60 Units Every Morning, and 50 in The Evening 8)  Promethazine Hcl 25 Mg  Tabs (Promethazine Hcl) .Marland Kitchen.. 1 By Mouth Qid As Needed Nausea 9)  Clomid 50 Mg  Tabs (Clomiphene Citrate) .... 1/4 Tab Qd 10)  Demadex 100 Mg  Tabs (Torsemide) .Marland Kitchen.. 1 By Mouth Qam 11)  Azor 10-40 Mg  Tabs (Amlodipine-Olmesartan) .... Take 1 Tab By Mouth Every Day 12)  Bd U/f Mini Pen Needle 31g X 5 Mm Misc (Insulin Pen Needle) .... Use As Directed 13)  Onetouch Ultra Test  Strp (Glucose Blood) .... Use Two Times A Day 14)  Onetouch Ultrasoft Lancets  Misc  (Lancets) .... Use As Directed 15)  Bystolic 10 Mg Tabs (Nebivolol Hcl) 16)  Viagra 100 Mg Tabs (Sildenafil Citrate) .Marland Kitchen.. 1 By Mouth Once Daily Prn  Current Medications (verified): 1)  Morphine Sulfate Cr 100 Mg Xr12h-Tab (Morphine Sulfate) .... 2 By Mouth Three Times A Day Fill On or After 07/10/10 2)  Morphine Sulfate 15 Mg Tabs (Morphine Sulfate) .Marland Kitchen.. 1 By Mouth Two Times A Day As Needed Pain Breakthrough Fill On or After 07/06/2010 3)  Aspirin 325 Mg Tabs (Aspirin) .Marland Kitchen.. 1 Qd 4)  Vitamin D3 1000 Unit  Tabs (Cholecalciferol) .Marland Kitchen.. 1 Qd 5)  Klor-Con M20 20 Meq Tbcr (Potassium Chloride Crys Cr) .Marland Kitchen.. 1 By Mouth Once Daily Prn 6)  Lovastatin 20 Mg Tabs (Lovastatin) .Marland Kitchen.. 1 Qd 7)  Humalog Mix 75/25 Kwikpen 75-25 %  Susp (Insulin Lispro Prot & Lispro) .... 60 Units Every Morning, and 50 in The Evening 8)  Promethazine Hcl 25 Mg  Tabs (Promethazine Hcl) .Marland Kitchen.. 1 By Mouth Qid As Needed Nausea 9)  Clomid 50 Mg  Tabs (Clomiphene Citrate) .... 1/4 Tab Qd 10)  Demadex 100 Mg  Tabs (Torsemide) .Marland Kitchen.. 1 By Mouth Qam 11)  Azor 10-40 Mg  Tabs (Amlodipine-Olmesartan) .... Take 1 Tab By Mouth Every Day 12)  Bd U/f Mini Pen Needle 31g X 5 Mm Misc (Insulin Pen Needle) .... Use As Directed 13)  Onetouch Ultra Test  Strp (Glucose Blood) .... Use Two Times A Day 14)  Onetouch Ultrasoft Lancets  Misc (Lancets) .... Use As Directed 15)  Bystolic 10 Mg Tabs (Nebivolol Hcl) 16)  Viagra 100 Mg Tabs (Sildenafil Citrate) .Marland Kitchen.. 1 By Mouth Once Daily Prn 17)  Ciprofloxacin Hcl 500 Mg Tabs (Ciprofloxacin Hcl) .... One By Mouth Two Times A Day For 10 Days 18)  Fluconazole 200 Mg Tabs (Fluconazole) .... One By Mouth Once Daily For 10 Days 19)  Ketoconazole 2 % Crea (Ketoconazole) .... Apply To Foreskin Two Times A Day For 10 Days  Allergies (verified): 1)  ! Nsaids 2)  Lotensin (Benazepril Hcl)  Past History:  Past Medical History: Last updated: 06/11/2007 Diabetes mellitus, type II Hyperlipidemia Hypertension Low back  pain GERD  Past Surgical History: Last updated: 04/01/2010 Denies surgical history  Family History: Last updated: 07/22/2007 Family History of CAD Male 1st degree relative <60  Social History: Last updated: 07/05/2010 Occupation: plumber Married Current  Smoker 2 ppd Alcohol use-yes Drug use-no Regular exercise-no  Risk Factors: Smoking Status: current (07/05/2010) Packs/Day: 2.0 (07/05/2010)  Family History: Reviewed history from 07/22/2007 and no changes required. Family History of CAD Male 1st degree relative <60  Social History: Reviewed history from 11/23/2007 and no changes required. Occupation: plumber Married Current Smoker 2 ppd Alcohol use-yes Drug use-no Regular exercise-no Hepatitis Risk:  no risk noted HIV Risk:  no risk noted STD Risk:  no risk noted Sexual History:  currently monogamous Blood Transfusions:  no Drug Use:  no Does Patient Exercise:  no  Review of Systems       The patient complains of weight gain.  The patient denies anorexia, fever, weight loss, chest pain, syncope, dyspnea on exertion, peripheral edema, prolonged cough, headaches, hemoptysis, abdominal pain, hematuria, enlarged lymph nodes, angioedema, and genital sores.   General:  Denies chills, fatigue, fever, loss of appetite, malaise, sleep disorder, sweats, weakness, and weight loss. GU:  Complains of decreased libido and erectile dysfunction; denies discharge, dysuria, genital sores, hematuria, incontinence, nocturia, urinary frequency, and urinary hesitancy. Endo:  Denies cold intolerance, excessive hunger, excessive thirst, excessive urination, heat intolerance, polyuria, and weight change.  Physical Exam  General:  alert, well-developed, well-nourished, well-hydrated, appropriate dress, and overweight-appearing.   Head:  normocephalic, atraumatic, no abnormalities observed, and no abnormalities palpated.   Eyes:  vision grossly intact and no injection.   Ears:  R ear  normal and L ear normal.   Mouth:  Oral mucosa and oropharynx without lesions or exudates.  Teeth in good repair. Neck:  supple, full ROM, no masses, no thyromegaly, no thyroid nodules or tenderness, no JVD, normal carotid upstroke, no carotid bruits, no cervical lymphadenopathy, and no neck tenderness.   Lungs:  normal respiratory effort, no intercostal retractions, no accessory muscle use, normal breath sounds, no dullness, no fremitus, no crackles, and no wheezes.   Heart:  normal rate, regular rhythm, no murmur, no gallop, no rub, and no JVD.   Abdomen:  soft, non-tender, normal bowel sounds, no distention, no masses, no guarding, no rigidity, no rebound tenderness, no abdominal hernia, no inguinal hernia, no hepatomegaly, and no splenomegaly.   Genitalia:  uncircumcised, foreskin has multiple tiny fissures, there is mild erythema and swelling, there are no vesicles or excoriations, the foreskin can be partially retracted and the glans penis is mildly erythematous but there is no exudate, the meatus is open and there is no discharge. no hydrocele, no varicocele, no scrotal masses, no testicular masses or atrophy, no cutaneous lesions, and no urethral discharge.   Msk:  Lumbar-sacral spine is tender to palpation over paraspinal muscles and painfull with the ROM Stiff ITs  Pulses:  R and L carotid,radial,femoral,dorsalis pedis and posterior tibial pulses are full and equal bilaterally Extremities:  No clubbing, cyanosis, edema, or deformity noted with normal full range of motion of all joints.   Neurologic:  No cranial nerve deficits noted. Station and gait are normal. Plantar reflexes are down-going bilaterally. DTRs are symmetrical throughout. Sensory, motor and coordinative functions appear intact. Skin:  insulin injection sites at anterior abdomen are normal  Inguinal Nodes:  no R inguinal adenopathy and no L inguinal adenopathy.   Psych:  alert and cooperative; normal mood and affect; normal  attention span and concentration   Impression & Recommendations:  Problem # 1:  BALANITIS (ICD-607.1) Assessment New start Cipro, Diflucan, and topical ketoconazole  Problem # 2:  PHIMOSIS (ICD-605) Assessment: New I think he needs to be  evaluated for a circumcision Orders: Urology Referral (Urology)  Problem # 3:  DIABETES MELLITUS, TYPE II (ICD-250.00) Assessment: Deteriorated  His updated medication list for this problem includes:    Aspirin 325 Mg Tabs (Aspirin) .Marland Kitchen... 1 qd    Humalog Mix 75/25 Kwikpen 75-25 % Susp (Insulin lispro prot & lispro) .Marland KitchenMarland KitchenMarland KitchenMarland Kitchen 60 units every morning, and 50 in the evening    Azor 10-40 Mg Tabs (Amlodipine-olmesartan) .Marland Kitchen... Take 1 tab by mouth every day  Orders: Endocrinology Referral (Endocrine) Ophthalmology Referral (Ophthalmology)  Labs Reviewed: Creat: 0.9 (06/04/2010)    Reviewed HgBA1c results: 11.5 (06/04/2010)  11.2 (04/09/2009)  Problem # 4:  HYPERTENSION (ICD-401.9) Assessment: Improved  His updated medication list for this problem includes:    Demadex 100 Mg Tabs (Torsemide) .Marland Kitchen... 1 by mouth qam    Azor 10-40 Mg Tabs (Amlodipine-olmesartan) .Marland Kitchen... Take 1 tab by mouth every day    Bystolic 10 Mg Tabs (Nebivolol hcl)  BP today: 104/80 Prior BP: 130/78 (06/04/2010)  Labs Reviewed: K+: 4.4 (06/04/2010) Creat: : 0.9 (06/04/2010)   Chol: 187 (06/04/2010)   HDL: 47.80 (06/04/2010)   LDL: 103 (06/04/2010)   TG: 179.0 (06/04/2010)  Complete Medication List: 1)  Morphine Sulfate Cr 100 Mg Xr12h-tab (Morphine sulfate) .... 2 by mouth three times a day fill on or after 07/10/10 2)  Morphine Sulfate 15 Mg Tabs (Morphine sulfate) .Marland Kitchen.. 1 by mouth two times a day as needed pain breakthrough fill on or after 07/06/2010 3)  Aspirin 325 Mg Tabs (Aspirin) .Marland Kitchen.. 1 qd 4)  Vitamin D3 1000 Unit Tabs (Cholecalciferol) .Marland Kitchen.. 1 qd 5)  Klor-con M20 20 Meq Tbcr (Potassium chloride crys cr) .Marland Kitchen.. 1 by mouth once daily prn 6)  Lovastatin 20 Mg Tabs  (Lovastatin) .Marland Kitchen.. 1 qd 7)  Humalog Mix 75/25 Kwikpen 75-25 % Susp (Insulin lispro prot & lispro) .... 60 units every morning, and 50 in the evening 8)  Promethazine Hcl 25 Mg Tabs (Promethazine hcl) .Marland Kitchen.. 1 by mouth qid as needed nausea 9)  Clomid 50 Mg Tabs (Clomiphene citrate) .... 1/4 tab qd 10)  Demadex 100 Mg Tabs (Torsemide) .Marland Kitchen.. 1 by mouth qam 11)  Azor 10-40 Mg Tabs (Amlodipine-olmesartan) .... Take 1 tab by mouth every day 12)  Bd U/f Mini Pen Needle 31g X 5 Mm Misc (Insulin pen needle) .... Use as directed 13)  Onetouch Ultra Test Strp (Glucose blood) .... Use two times a day 14)  Onetouch Ultrasoft Lancets Misc (Lancets) .... Use as directed 15)  Bystolic 10 Mg Tabs (Nebivolol hcl) 16)  Viagra 100 Mg Tabs (Sildenafil citrate) .Marland Kitchen.. 1 by mouth once daily prn 17)  Ciprofloxacin Hcl 500 Mg Tabs (Ciprofloxacin hcl) .... One by mouth two times a day for 10 days 18)  Fluconazole 200 Mg Tabs (Fluconazole) .... One by mouth once daily for 10 days 19)  Ketoconazole 2 % Crea (Ketoconazole) .... Apply to foreskin two times a day for 10 days  Patient Instructions: 1)  Please schedule a follow-up appointment in 2 weeks. 2)  Tobacco is very bad for your health and your loved ones! You Should stop smoking!. 3)  Stop Smoking Tips: Choose a Quit date. Cut down before the Quit date. decide what you will do as a substitute when you feel the urge to smoke(gum,toothpick,exercise). 4)  Check your blood sugars regularly. If your readings are usually above 200 or below 70 you should contact our office. 5)  It is important that your Diabetic A1c level is checked every 3  months. 6)  See your eye doctor yearly to check for diabetic eye damage. 7)  Check your feet each night for sore areas, calluses or signs of infection. 8)  Check your Blood Pressure regularly. If it is above: you should make an appointment. 9)  It is important that you exercise regularly at least 20 minutes 5 times a week. If you develop  chest pain, have severe difficulty breathing, or feel very tired , stop exercising immediately and seek medical attention. 10)  You need to lose weight. Consider a lower calorie diet and regular exercise.  Prescriptions: KETOCONAZOLE 2 % CREA (KETOCONAZOLE) apply to foreskin two times a day for 10 days  #60 gms x 2   Entered and Authorized by:   Etta Grandchild MD   Signed by:   Etta Grandchild MD on 07/05/2010   Method used:   Print then Give to Patient   RxID:   [id number] FLUCONAZOLE 200 MG TABS (FLUCONAZOLE) One by mouth once daily for 10 days  #10 x 1   Entered and Authorized by:   Etta Grandchild MD   Signed by:   Etta Grandchild MD on 07/05/2010   Method used:   Print then Give to Patient   RxID:   [id number] CIPROFLOXACIN HCL 500 MG TABS (CIPROFLOXACIN HCL) One by mouth two times a day for 10 days  #20 x 1   Entered and Authorized by:   Etta Grandchild MD   Signed by:   Etta Grandchild MD on 07/05/2010   Method used:   Print then Give to Patient   RxID:   [id number]    Orders Added: 1)  Urology Referral [Urology] 2)  Endocrinology Referral [Endocrine] 3)  Ophthalmology Referral [Ophthalmology] 4)  Est. Patient Level V [44034]    Laboratory Results   Blood Tests     Glucose (random): 192 mg/dL   (Normal Range: 74-259)

## 2010-10-01 NOTE — Assessment & Plan Note (Signed)
Summary: Cardiology Nuclear Testing  Nuclear Med Background Indications for Stress Test: Evaluation for Ischemia, Abnormal EKG   History: COPD, Myocardial Perfusion Study  History Comments: '06 MPS:no ischemia, apical thinning, EF=57%.  Symptoms: Chest Pain with Exertion, DOE  Symptoms Comments: Patient c/o chest "cramping", but does not remember the last time it occurred.   Nuclear Pre-Procedure Cardiac Risk Factors: Family History - CAD, Hypertension, IDDM Type 2, Lipids, Obesity, Smoker Caffeine/Decaff Intake: None NPO After: 7:00 PM Lungs: Coarse with minimal inspiratory wheezes.  O2 Sat 97% on RA.  Albuterol inhaler used prior to infusion. IV 0.9% NS with Angio Cath: 22g     IV Site: R Antecubital IV Started by: Irean Hong, RN Chest Size (in) 42     Height (in): 70 Weight (lb): 227 BMI: 32.69 Tech Comments: Last bystolic 60 hours ago. FBS today 238 at 7:45 am here.  Nuclear Med Study 1 or 2 day study:  1 day     Stress Test Type:  Eugenie Birks Reading MD:  Charlton Haws, MD     Referring MD:  Sonda Primes, MD Resting Radionuclide:  Technetium 45m Tetrofosmin     Resting Radionuclide Dose:  11 mCi  Stress Radionuclide:  Technetium 71m Tetrofosmin     Stress Radionuclide Dose:  33 mCi   Stress Protocol   Lexiscan: 0.4 mg   Stress Test Technologist:  Rea College, CMA-N     Nuclear Technologist:  Domenic Polite, CNMT  Rest Procedure  Myocardial perfusion imaging was performed at rest 45 minutes following the intravenous administration of Technetium 71m Tetrofosmin.  Stress Procedure  The patient received IV Lexiscan 0.4 mg over 15-seconds.  Technetium 62m Tetrofosmin injected at 30-seconds.  There were no significant changes with infusion, other than a rare PVC.  Quantitative spect images were obtained after a 45 minute delay.  QPS Raw Data Images:  Normal; no motion artifact; normal heart/lung ratio. Stress Images:  Normal homogeneous uptake in all areas of the  myocardium. Rest Images:  Normal homogeneous uptake in all areas of the myocardium. Subtraction (SDS):  SDS 3 Transient Ischemic Dilatation:  1.06  (Normal <1.22)  Lung/Heart Ratio:  .38  (Normal <0.45)  Quantitative Gated Spect Images QGS EDV:  154 ml QGS ESV:  63 ml QGS EF:  59 % QGS cine images:  normal  Findings Normal nuclear study      Overall Impression  Exercise Capacity: Lexiscan with no exercise. BP Response: Normal blood pressure response. Clinical Symptoms: Dysnpea and coughing ECG Impression: No significant ST segment change suggestive of ischemia. Overall Impression: Normal stress nuclear study.  Appended Document: Cardiology Nuclear Testing Misty Stanley, please inform - stress test was nl Thanks, AP   Appended Document: Cardiology Nuclear Testing left mess for pt to call back   Appended Document: Cardiology Nuclear Testing pt informed

## 2010-10-01 NOTE — Progress Notes (Signed)
Summary: Nuclear pre procedure  Phone Note Outgoing Call Call back at [facility] Phone [phone]   Call placed by: Rea College, CMA,  June 18, 2010 4:30 PM Call placed to: Patient Summary of Call: Reviewed information on Myov[employer] Information Sheet (see scanned document for further details).  Spoke with daughter.      Nuclear Med Background Indications for Stress Test: Evaluation for Ischemia, Abnormal EKG   History: COPD, Myocardial Perfusion Study  History Comments: '06 MPS:no ischemia  Symptoms: Chest Pain with Exertion, DOE    Nuclear Pre-Procedure Cardiac Risk Factors: Family History - CAD, Hypertension, IDDM Type 2, Obesity, Smoker Height (in): 70

## 2010-10-01 NOTE — Miscellaneous (Signed)
Summary: Appointment Canceled  Appointment status changed to canceled by [employer] on 06/12/2010 4:57 PM.  Cancellation Comments --------------------- CRS/DX:ABN EKG/WT:236/INS:BCBS/DR PLOTNIKOV  Appointment Information ----------------------- Appt Type:  CARDIOLOGY NUCLEAR TESTING      Date:  Tuesday, June 18, 2010      Time:  8:45 AM for 15 min   Urgency:  Routine   Made By:  Pearson Grippe  To Visit:  [id number]    Reason:  CRS/DX:ABN EKG/WT:236/INS:BCBS/DR PLOTNIKOV  Appt Comments ------------- -- 06/12/10 16:57: (CEMR) CANCELED -- CRS/DX:ABN EKG/WT:236/INS:BCBS/DR PLOTNIKOV -- 06/12/10 14:17: (CEMR) BOOKED -- Routine CARDIOLOGY NUCLEAR TESTING at 06/18/2010 8:45 AM for 15 min CRS/DX:ABN EKG/WT:236/INS:BCBS/DR PLOTNIKOV

## 2010-10-01 NOTE — Progress Notes (Signed)
Summary: REFILL  Phone Note Refill Request   Refills Requested: Medication #1:  MORPHINE SULFATE CR 100 MG XR12H-TAB 2 by mouth tid Patient is requesting to fill on the 12th  Initial call taken by: Lamar Sprinkles, CMA,  October 08, 2009 1:40 PM  Follow-up for Phone Call        OK if time Follow-up by: Tresa Garter MD,  October 08, 2009 2:11 PM  Additional Follow-up for Phone Call Additional follow up Details #1::        Pt informed  Additional Follow-up by: Lamar Sprinkles, CMA,  October 08, 2009 5:19 PM    New/Updated Medications: MORPHINE SULFATE CR 100 MG XR12H-TAB (MORPHINE SULFATE) 2 by mouth three times a day Fill on or after 10/13/09 Prescriptions: MORPHINE SULFATE CR 100 MG XR12H-TAB (MORPHINE SULFATE) 2 by mouth three times a day Fill on or after 10/13/09  #180 x 0   Entered by:   Lamar Sprinkles, CMA   Authorized by:   Jacques Navy MD   Signed by:   Lamar Sprinkles, CMA on 10/08/2009   Method used:   Print then Give to Patient   RxID:   [id number]

## 2010-10-01 NOTE — Progress Notes (Signed)
  Phone Note From Other Clinic   Summary of Call: needs Rx  Follow-up for Phone Call        ok Follow-up by: Tresa Garter MD,  July 30, 2010 10:16 AM    New/Updated Medications: MORPHINE SULFATE CR 100 MG XR12H-TAB (MORPHINE SULFATE) 2 by mouth three times a day Fill on or after 08/09/10 MORPHINE SULFATE 15 MG TABS (MORPHINE SULFATE) 1 by mouth two times a day as needed pain breakthrough fill on or after 08/05/2010 Prescriptions: MORPHINE SULFATE 15 MG TABS (MORPHINE SULFATE) 1 by mouth two times a day as needed pain breakthrough fill on or after 08/05/2010  #60 x 0   Entered and Authorized by:   Tresa Garter MD   Signed by:   Tresa Garter MD on 07/30/2010   Method used:   Print then Give to Patient   RxID:   [id number] MORPHINE SULFATE CR 100 MG XR12H-TAB (MORPHINE SULFATE) 2 by mouth three times a day Fill on or after 08/09/10  #180 x 0   Entered and Authorized by:   Tresa Garter MD   Signed by:   Tresa Garter MD on 07/30/2010   Method used:   Print then Give to Patient   RxID:   [id number]

## 2010-10-01 NOTE — Progress Notes (Signed)
Summary: REFILL  Phone Note Refill Request Call back at Home Phone [phone]   Refills Requested: Medication #1:  MORPHINE SULFATE CR 100 MG XR12H-TAB 2 by mouth three times a day Fill on or after 11/10/09  Medication #2:  MORPHINE SULFATE 15 MG TABS 1 by mouth two times a day as needed pain breakthrough To fill saturday ok? Pt is going out of town.  Initial call taken by: Lamar Sprinkles, CMA,  December 04, 2009 8:32 AM  Follow-up for Phone Call        OK Thx Follow-up by: Tresa Garter MD,  December 04, 2009 1:14 PM  Additional Follow-up for Phone Call Additional follow up Details #1::        left mess to call office back, rx's ready Additional Follow-up by: Lamar Sprinkles, CMA,  December 04, 2009 2:38 PM    Additional Follow-up for Phone Call Additional follow up Details #2::    Pt informed to pick up Follow-up by: Lamar Sprinkles, CMA,  December 04, 2009 4:01 PM  New/Updated Medications: MORPHINE SULFATE CR 100 MG XR12H-TAB (MORPHINE SULFATE) 2 by mouth three times a day Fill on or after 12/08/09 Prescriptions: MORPHINE SULFATE 15 MG TABS (MORPHINE SULFATE) 1 by mouth two times a day as needed pain breakthrough  #60 x 0   Entered by:   Lamar Sprinkles, CMA   Authorized by:   Tresa Garter MD   Signed by:   Lamar Sprinkles, CMA on 12/04/2009   Method used:   Print then Give to Patient   RxID:   [id number] MORPHINE SULFATE CR 100 MG XR12H-TAB (MORPHINE SULFATE) 2 by mouth three times a day Fill on or after 12/08/09  #180 x 0   Entered by:   Lamar Sprinkles, CMA   Authorized by:   Tresa Garter MD   Signed by:   Lamar Sprinkles, CMA on 12/04/2009   Method used:   Print then Give to Patient   RxID:   [id number]

## 2010-10-01 NOTE — Progress Notes (Signed)
Summary: Pain rx  Phone Note Call from Patient Call back at Home Phone [phone]   Caller: Spouse Summary of Call: Patient spouse called stating that the patient was supposed to be given a prescription for breakthrough pain but did not receive that prescription at last office visit. Per spouse, the patient has been doing ok with the new med, but does still have some breakthrough pain between dosings. Please advise. Initial call taken by: Lucious Groves,  October 04, 2009 9:07 AM  Follow-up for Phone Call        OK Morphine as needed 15 mg Follow-up by: Tresa Garter MD,  October 04, 2009 12:52 PM  Additional Follow-up for Phone Call Additional follow up Details #1::        printed rx. gave to [facility] awaiting signature from md. Additional Follow-up by: Orlan Leavens,  October 04, 2009 1:39 PM    Additional Follow-up for Phone Call Additional follow up Details #2::    Spoke with spouse and she will stop by at lunch tomorrow to pick up prescription. Follow-up by: Lucious Groves,  October 04, 2009 3:13 PM  Additional Follow-up for Phone Call Additional follow up Details #3:: Details for Additional Follow-up Action Taken: prescription placed up front for pick up. Lucious Groves  October 05, 2009 10:16 AM   New/Updated Medications: MORPHINE SULFATE 15 MG TABS (MORPHINE SULFATE) 1 by mouth two times a day as needed pain breakthrough Prescriptions: MORPHINE SULFATE 15 MG TABS (MORPHINE SULFATE) 1 by mouth two times a day as needed pain breakthrough  #60 x 0   Entered and Authorized by:   Tresa Garter MD   Signed by:   Orlan Leavens on 10/04/2009   Method used:   Print then Give to Patient   RxID:   [id number]

## 2010-10-01 NOTE — Progress Notes (Signed)
Summary: REFILLs  Phone Note Refill Request   Refills Requested: Medication #1:  MORPHINE SULFATE CR 100 MG XR12H-TAB 2 by mouth three times a day Fill on or after 12/08/09  Medication #2:  MORPHINE SULFATE 15 MG TABS 1 by mouth two times a day as needed pain breakthrough. Initial call taken by: Lamar Sprinkles, CMA,  Jan 03, 2010 9:34 AM  Follow-up for Phone Call        ok to ref Follow-up by: Tresa Garter MD,  Jan 03, 2010 1:29 PM  Additional Follow-up for Phone Call Additional follow up Details #1::        OK per MD to prepare 3 mths of refills. Pt informed that it is their responsibity to keep up with rx's. If lost we are not able to replace rx's.  Pt uses [facility] who may be able to keep future rx's on file. Will print rx's Monday when MD returns.  Additional Follow-up by: Lamar Sprinkles, CMA,  Jan 03, 2010 4:57 PM    Additional Follow-up for Phone Call Additional follow up Details #2::    Pt informed, rx's ready Follow-up by: Lamar Sprinkles, CMA,  Jan 08, 2010 10:25 AM  New/Updated Medications: MORPHINE SULFATE CR 100 MG XR12H-TAB (MORPHINE SULFATE) 2 by mouth three times a day Fill on or after 01/07/10 MORPHINE SULFATE CR 100 MG XR12H-TAB (MORPHINE SULFATE) 2 by mouth three times a day Fill on or after 03/09/10 MORPHINE SULFATE CR 100 MG XR12H-TAB (MORPHINE SULFATE) 2 by mouth three times a day Fill on or after 02/07/10 MORPHINE SULFATE 15 MG TABS (MORPHINE SULFATE) 1 by mouth two times a day as needed pain breakthrough fill on or after 01/03/2010 MORPHINE SULFATE 15 MG TABS (MORPHINE SULFATE) 1 by mouth two times a day as needed pain breakthrough fill on or after 03/05/2010 Prescriptions: MORPHINE SULFATE 15 MG TABS (MORPHINE SULFATE) 1 by mouth two times a day as needed pain breakthrough fill on or after 03/05/2010  #60 x 0   Entered by:   Lamar Sprinkles, CMA   Authorized by:   Tresa Garter MD   Signed by:   Lamar Sprinkles, CMA on 01/08/2010   Method  used:   Print then Give to Patient   RxID:   [id number] MORPHINE SULFATE CR 100 MG XR12H-TAB (MORPHINE SULFATE) 2 by mouth three times a day Fill on or after 03/09/10  #180 x 0   Entered by:   Lamar Sprinkles, CMA   Authorized by:   Tresa Garter MD   Signed by:   Lamar Sprinkles, CMA on 01/08/2010   Method used:   Print then Give to Patient   RxID:   [id number] MORPHINE SULFATE 15 MG TABS (MORPHINE SULFATE) 1 by mouth two times a day as needed pain breakthrough fill on or after 01/03/2010  #60 x 0   Entered by:   Lamar Sprinkles, CMA   Authorized by:   Tresa Garter MD   Signed by:   Lamar Sprinkles, CMA on 01/08/2010   Method used:   Print then Give to Patient   RxID:   [id number] MORPHINE SULFATE CR 100 MG XR12H-TAB (MORPHINE SULFATE) 2 by mouth three times a day Fill on or after 02/07/10  #180 x 0   Entered by:   Lamar Sprinkles, CMA   Authorized by:   Tresa Garter MD   Signed by:   Lamar Sprinkles, CMA on 01/08/2010   Method used:   Print  then Give to Patient   RxID:   [id number] MORPHINE SULFATE 15 MG TABS (MORPHINE SULFATE) 1 by mouth two times a day as needed pain breakthrough fill on or after 01/03/2010  #60 x 0   Entered by:   Ami Bullins CMA   Authorized by:   Tresa Garter MD   Signed by:   Bill Salinas CMA on 01/03/2010   Method used:   Print then Give to Patient   RxID:   [id number] MORPHINE SULFATE CR 100 MG XR12H-TAB (MORPHINE SULFATE) 2 by mouth three times a day Fill on or after 01/07/10  #180 x 0   Entered by:   Ami Bullins CMA   Authorized by:   Tresa Garter MD   Signed by:   Bill Salinas CMA on 01/03/2010   Method used:   Print then Give to Patient   RxID:   [id number]

## 2010-10-01 NOTE — Assessment & Plan Note (Signed)
Summary: DISCUSS RX-LB   Vital Signs:  Patient profile:   45 year old male Pulse rate:   90 / minute Resp:     16 per minute BP supine:   140 / 90  History of Present Illness: C/o LBP. Oxycont - too $$$  Allergies: 1)  ! Nsaids 2)  Lotensin (Benazepril Hcl)  Past History:  Past Medical History: Last updated: 06/11/2007 Diabetes mellitus, type II Hyperlipidemia Hypertension Low back pain GERD  Social History: Last updated: 11/23/2007 Occupation: plumber Married Current Smoker 2 ppd  Physical Exam  General:  NAD, obese.   Mouth:  Oral mucosa and oropharynx without lesions or exudates.  Teeth in good repair. Neck:  no masses, thyromegaly, or abnormal cervical nodes Lungs:  No B rhonchi, CTA Heart:  RRR, tachy Abdomen:  soft and non-tender.   Msk:  Lumbar-sacral spine is tender to palpation over paraspinal muscles and painfull with the ROM  Neurologic:  sensation is intact to touch on the feet  Skin:  insulin injection sites at anterior abdomen are normal  Psych:  alert and cooperative; normal mood and affect; normal attention span and concentration   Impression & Recommendations:  Problem # 1:  LOW BACK PAIN (ICD-724.2) Assessment Unchanged  His updated medication list for this problem includes:    Oxycontin 80 Mg Tb12 (Oxycodone hcl) .Marland Kitchen... 1 four times daily fill on or after 08/11/2009 too $$    Oxycontin 20 Mg Tb12 (Oxycodone hcl) .Marland Kitchen... 1 po four times daily fill on or after 08/11/2009   The equiv. dose for MS would be 600 mg a day  Complete Medication List: 1)  Aspirin 325 Mg Tabs (Aspirin) .Marland Kitchen.. 1 qd 2)  Vitamin D3 1000 Unit Tabs (Cholecalciferol) .Marland Kitchen.. 1 qd 3)  Klor-con M20 20 Meq Tbcr (Potassium chloride crys cr) .Marland Kitchen.. 1 by mouth once daily prn 4)  Lovastatin 20 Mg Tabs (Lovastatin) .Marland Kitchen.. 1 qd 5)  Humalog Mix 75/25 Kwikpen 75-25 % Susp (Insulin lispro prot & lispro) .... 60 units every morning, and 50 in the evening 6)  Promethazine Hcl 25 Mg Tabs  (Promethazine hcl) .Marland Kitchen.. 1 by mouth qid as needed nausea 7)  Clomid 50 Mg Tabs (Clomiphene citrate) .... 1/4 tab qd 8)  Levitra 20 Mg Tabs (Vardenafil hcl) .... As needed use 9)  Demadex 100 Mg Tabs (Torsemide) .Marland Kitchen.. 1 by mouth qam 10)  Azor 10-40 Mg Tabs (Amlodipine-olmesartan) .... Take 1 tab by mouth every day 11)  Bd U/f Mini Pen Needle 31g X 5 Mm Misc (Insulin pen needle) .... Use as directed 12)  Onetouch Ultra Test Strp (Glucose blood) .... Use two times a day 13)  Onetouch Ultrasoft Lancets Misc (Lancets) .... Use as directed 14)  Coreg 25 Mg Tabs (Carvedilol) .Marland Kitchen.. 1po bid 15)  Morphine Sulfate Cr 100 Mg Xr12h-tab (Morphine sulfate) .... 2 by mouth tid  Patient Instructions: 1)  Please schedule a follow-up appointment in 2 months. Prescriptions: MORPHINE SULFATE CR 100 MG XR12H-TAB (MORPHINE SULFATE) 2 by mouth tid  #180 x 0   Entered and Authorized by:   Tresa Garter MD   Signed by:   Tresa Garter MD on 09/13/2009   Method used:   Print then Give to Patient   RxID:   [id number]

## 2010-10-01 NOTE — Assessment & Plan Note (Signed)
Summary: CPX /NWS  #   Vital Signs:  Patient profile:   45 year old male Height:      70 inches Weight:      236 pounds BMI:     33.98 Temp:     97.5 degrees F oral Pulse rate:   95 / minute Pulse rhythm:   regular BP sitting:   130 / 78  (left arm) Cuff size:   regular  Vitals Entered By: Lanier Prude, Beverly Gust) (June 04, 2010 3:29 PM) CC: CPX Is Patient Diabetic? Yes   CC:  CPX.  History of Present Illness: The patient presents for a preventive health examination   Preventive Screening-Counseling & Management  Alcohol-Tobacco     Smoking Status: current     Packs/Day: 2.0  Current Medications (verified): 1)  Morphine Sulfate Cr 100 Mg Xr12h-Tab (Morphine Sulfate) .... 2 By Mouth Three Times A Day Fill On or After 05/10/10 2)  Morphine Sulfate 15 Mg Tabs (Morphine Sulfate) .Marland Kitchen.. 1 By Mouth Two Times A Day As Needed Pain Breakthrough Fill On or After 05/06/2010 3)  Aspirin 325 Mg Tabs (Aspirin) .Marland Kitchen.. 1 Qd 4)  Vitamin D3 1000 Unit  Tabs (Cholecalciferol) .Marland Kitchen.. 1 Qd 5)  Klor-Con M20 20 Meq Tbcr (Potassium Chloride Crys Cr) .Marland Kitchen.. 1 By Mouth Once Daily Prn 6)  Lovastatin 20 Mg Tabs (Lovastatin) .Marland Kitchen.. 1 Qd 7)  Humalog Mix 75/25 Kwikpen 75-25 %  Susp (Insulin Lispro Prot & Lispro) .... 60 Units Every Morning, and 50 in The Evening 8)  Promethazine Hcl 25 Mg  Tabs (Promethazine Hcl) .Marland Kitchen.. 1 By Mouth Qid As Needed Nausea 9)  Clomid 50 Mg  Tabs (Clomiphene Citrate) .... 1/4 Tab Qd 10)  Levitra 20 Mg  Tabs (Vardenafil Hcl) .... As Needed Use 11)  Demadex 100 Mg  Tabs (Torsemide) .Marland Kitchen.. 1 By Mouth Qam 12)  Azor 10-40 Mg  Tabs (Amlodipine-Olmesartan) .... Take 1 Tab By Mouth Every Day 13)  Bd U/f Mini Pen Needle 31g X 5 Mm Misc (Insulin Pen Needle) .... Use As Directed 14)  Onetouch Ultra Test  Strp (Glucose Blood) .... Use Two Times A Day 15)  Onetouch Ultrasoft Lancets  Misc (Lancets) .... Use As Directed 16)  Chantix Continuing Month Pak 1 Mg Tabs (Varenicline Tartrate) .... Use  Asd 1 By Mouth Once Daily 17)  Coreg 25 Mg Tabs (Carvedilol) .Marland Kitchen.. 1po Bid 18)  Bystolic 10 Mg Tabs (Nebivolol Hcl)  Allergies (verified): 1)  ! Nsaids 2)  Lotensin (Benazepril Hcl)  Past History:  Past Medical History: Last updated: 06/11/2007 Diabetes mellitus, type II Hyperlipidemia Hypertension Low back pain GERD  Past Surgical History: Last updated: 04/01/2010 Denies surgical history  Family History: Last updated: 07/22/2007 Family History of CAD Male 1st degree relative <60  Social History: Last updated: 11/23/2007 Occupation: plumber Married Current Smoker 2 ppd  Review of Systems       The patient complains of dyspnea on exertion and chest pain.  The patient denies anorexia, fever, weight loss, vision loss, decreased hearing, hoarseness, syncope, peripheral edema, prolonged cough, headaches, hemoptysis, abdominal pain, melena, hematochezia, severe indigestion/heartburn, hematuria, incontinence, genital sores, muscle weakness, suspicious skin lesions, transient blindness, difficulty walking, depression, unusual weight change, abnormal bleeding, enlarged lymph nodes, angioedema, and testicular masses.         LBP  Physical Exam  General:  NAD, obese.   Head:  Normocephalic and atraumatic without obvious abnormalities. No apparent alopecia or balding. Eyes:  No corneal or conjunctival inflammation  noted. EOMI. Perrla. Ears:  TMs intact and clear with normal canals and hearing Nose:  Erythematous throat mucosa and intranasal erythema.  Mouth:  Oral mucosa and oropharynx without lesions or exudates.  Teeth in good repair. Neck:  no masses, thyromegaly, or abnormal cervical nodes Chest Wall:  No deformities, masses, tenderness or gynecomastia noted. Lungs:  No B rhonchi, CTA Heart:  RRR, tachy Abdomen:  soft and non-tender.   Genitalia:  self nl Msk:  Lumbar-sacral spine is tender to palpation over paraspinal muscles and painfull with the ROM Stiff  ITs  Pulses:  R and L carotid,radial,femoral,dorsalis pedis and posterior tibial pulses are full and equal bilaterally Extremities:  No clubbing, cyanosis, edema, or deformity noted with normal full range of motion of all joints.   Neurologic:  No cranial nerve deficits noted. Station and gait are normal. Plantar reflexes are down-going bilaterally. DTRs are symmetrical throughout. Sensory, motor and coordinative functions appear intact. Skin:  insulin injection sites at anterior abdomen are normal  Cervical Nodes:  No lymphadenopathy noted Inguinal Nodes:  No significant adenopathy Psych:  alert and cooperative; normal mood and affect; normal attention span and concentration   Impression & Recommendations:  Problem # 1:  HEALTH MAINTENANCE EXAM (ICD-V70.0) Assessment New Health and age related issues were discussed. Available screening tests and vaccinations were discussed as well. Healthy life style including good diet and exercise was discussed.   Orders: TLB-CBC Platelet - w/Differential (85025-CBCD) TLB-Hepatic/Liver Function Pnl (80076-HEPATIC) TLB-Lipid Panel (80061-LIPID) TLB-PSA (Prostate Specific Antigen) (84153-PSA) TLB-TSH (Thyroid Stimulating Hormone) (84443-TSH) TLB-Udip ONLY (81003-UDIP)  Problem # 2:  ABNORMAL ELECTROCARDIOGRAM (ICD-794.31) Assessment: Comment Only  Orders: Cardiolite (Cardiolite)  Problem # 3:  CHEST PAIN, UNSPECIFIED (ICD-786.50) not new Assessment: Unchanged  Orders: Cardiolite (Cardiolite)  Problem # 4:  DIABETES MELLITUS, TYPE II (ICD-250.00) poor control Assessment: Unchanged  His updated medication list for this problem includes:    Aspirin 325 Mg Tabs (Aspirin) .Marland Kitchen... 1 qd    Humalog Mix 75/25 Kwikpen 75-25 % Susp (Insulin lispro prot & lispro) .Marland KitchenMarland KitchenMarland KitchenMarland Kitchen 60 units every morning, and 50 in the evening    Azor 10-40 Mg Tabs (Amlodipine-olmesartan) .Marland Kitchen... Take 1 tab by mouth every day  Problem # 5:  TOBACCO USE DISORDER/SMOKER-SMOKING  CESSATION DISCUSSED (ICD-305.1) Assessment: Unchanged  The following medications were removed from the medication list:    Chantix Continuing Month Pak 1 Mg Tabs (Varenicline tartrate) ..... Use asd 1 by mouth once daily  Encouraged smoking cessation and discussed different methods for smoking cessation.   Complete Medication List: 1)  Morphine Sulfate Cr 100 Mg Xr12h-tab (Morphine sulfate) .... 2 by mouth three times a day fill on or after 07/10/10 2)  Morphine Sulfate 15 Mg Tabs (Morphine sulfate) .Marland Kitchen.. 1 by mouth two times a day as needed pain breakthrough fill on or after 07/06/2010 3)  Aspirin 325 Mg Tabs (Aspirin) .Marland Kitchen.. 1 qd 4)  Vitamin D3 1000 Unit Tabs (Cholecalciferol) .Marland Kitchen.. 1 qd 5)  Klor-con M20 20 Meq Tbcr (Potassium chloride crys cr) .Marland Kitchen.. 1 by mouth once daily prn 6)  Lovastatin 20 Mg Tabs (Lovastatin) .Marland Kitchen.. 1 qd 7)  Humalog Mix 75/25 Kwikpen 75-25 % Susp (Insulin lispro prot & lispro) .... 60 units every morning, and 50 in the evening 8)  Promethazine Hcl 25 Mg Tabs (Promethazine hcl) .Marland Kitchen.. 1 by mouth qid as needed nausea 9)  Clomid 50 Mg Tabs (Clomiphene citrate) .... 1/4 tab qd 10)  Levitra 20 Mg Tabs (Vardenafil hcl) .... As needed use 11)  Demadex 100 Mg Tabs (Torsemide) .Marland Kitchen.. 1 by mouth qam 12)  Azor 10-40 Mg Tabs (Amlodipine-olmesartan) .... Take 1 tab by mouth every day 13)  Bd U/f Mini Pen Needle 31g X 5 Mm Misc (Insulin pen needle) .... Use as directed 14)  Onetouch Ultra Test Strp (Glucose blood) .... Use two times a day 15)  Onetouch Ultrasoft Lancets Misc (Lancets) .... Use as directed 16)  Bystolic 10 Mg Tabs (Nebivolol hcl)  Other Orders: EKG w/ Interpretation (93000) Tdap => 19yrs IM (21308) Admin 1st Vaccine (65784) Flu Vaccine 30yrs + (69629) TLB-BMP (Basic Metabolic Panel-BMET) (80048-METABOL)  Patient Instructions: 1)  Please schedule a follow-up appointment in 3 months. 2)  BMP prior to visit, ICD-9: 3)  HbgA1C prior to visit, ICD-9: 4)  Testosterone  250.00 5)  Go on Youtube (www.youtube.com) and look up "piriformis stretch", "Ileopsoas stretch", "IT band stretch" and "gluteus stretch". See the anatomy and learn the symptoms.  You can try to self-diagnose.Do the stretches - it may help! Prescriptions: MORPHINE SULFATE 15 MG TABS (MORPHINE SULFATE) 1 by mouth two times a day as needed pain breakthrough fill on or after 07/06/2010  #60 x 0   Entered and Authorized by:   Tresa Garter MD   Signed by:   Tresa Garter MD on 06/04/2010   Method used:   Print then Give to Patient   RxID:   [id number] MORPHINE SULFATE CR 100 MG XR12H-TAB (MORPHINE SULFATE) 2 by mouth three times a day Fill on or after 07/10/10  #180 x 0   Entered and Authorized by:   Tresa Garter MD   Signed by:   Tresa Garter MD on 06/04/2010   Method used:   Print then Give to Patient   RxID:   [id number] MORPHINE SULFATE 15 MG TABS (MORPHINE SULFATE) 1 by mouth two times a day as needed pain breakthrough fill on or after 06/05/2010  #60 x 0   Entered and Authorized by:   Tresa Garter MD   Signed by:   Tresa Garter MD on 06/04/2010   Method used:   Print then Give to Patient   RxID:   [id number] MORPHINE SULFATE CR 100 MG XR12H-TAB (MORPHINE SULFATE) 2 by mouth three times a day Fill on or after 010/09/11  #180 x 0   Entered and Authorized by:   Tresa Garter MD   Signed by:   Tresa Garter MD on 06/04/2010   Method used:   Print then Give to Patient   RxID:   [id number] BYSTOLIC 10 MG TABS (NEBIVOLOL HCL)   #30 x 11   Entered and Authorized by:   Tresa Garter MD   Signed by:   Tresa Garter MD on 06/04/2010   Method used:   Print then Give to Patient   RxID:   [id number] ONETOUCH ULTRASOFT LANCETS  MISC (LANCETS) use as directed  #100 x 6   Entered and Authorized by:   Tresa Garter MD   Signed by:   Tresa Garter MD on 06/04/2010   Method used:   Print then Give  to Patient   RxID:   [id number] ONETOUCH ULTRA TEST  STRP (GLUCOSE BLOOD) use two times a day  #50 x 6   Entered and Authorized by:   Tresa Garter MD   Signed by:   Tresa Garter MD on 06/04/2010   Method used:   Print then Give to Patient  RxID:   [id number] BD U/F MINI PEN NEEDLE 31G X 5 MM MISC (INSULIN PEN NEEDLE) use as directed  #100 x 6   Entered and Authorized by:   Tresa Garter MD   Signed by:   Tresa Garter MD on 06/04/2010   Method used:   Print then Give to Patient   RxID:   [id number] AZOR 10-40 MG  TABS (AMLODIPINE-OLMESARTAN) Take 1 tab by mouth every day  #30 x 12   Entered and Authorized by:   Tresa Garter MD   Signed by:   Tresa Garter MD on 06/04/2010   Method used:   Print then Give to Patient   RxID:   [id number] DEMADEX 100 MG  TABS (TORSEMIDE) 1 by mouth qam  #30 x 12   Entered and Authorized by:   Tresa Garter MD   Signed by:   Tresa Garter MD on 06/04/2010   Method used:   Print then Give to Patient   RxID:   [id number] LEVITRA 20 MG  TABS (VARDENAFIL HCL) as needed use  #3 x 11   Entered and Authorized by:   Tresa Garter MD   Signed by:   Tresa Garter MD on 06/04/2010   Method used:   Print then Give to Patient   RxID:   [id number] CLOMID 50 MG  TABS (CLOMIPHENE CITRATE) 1/4 tab qd  #8 x 11   Entered and Authorized by:   Tresa Garter MD   Signed by:   Tresa Garter MD on 06/04/2010   Method used:   Print then Give to Patient   RxID:   [id number] HUMALOG MIX 75/25 KWIKPEN 75-25 %  SUSP (INSULIN LISPRO PROT & LISPRO) 60 units every morning, and 50 in the evening  #qs x 11   Entered and Authorized by:   Tresa Garter MD   Signed by:   Tresa Garter MD on 06/04/2010   Method used:   Print then Give to Patient   RxID:   [id number] LOVASTATIN 20 MG TABS (LOVASTATIN) 1 qd  #30 x 12   Entered and Authorized by:   Tresa Garter MD   Signed by:   Tresa Garter MD on 06/04/2010   Method used:   Print then Give to Patient   RxID:   [id number] KLOR-CON M20 20 MEQ TBCR (POTASSIUM CHLORIDE CRYS CR) 1 by mouth once daily prn  #30 Each x 11   Entered and Authorized by:   Tresa Garter MD   Signed by:   Tresa Garter MD on 06/04/2010   Method used:   Print then Give to Patient   RxID:   [id number]    Immunizations Administered:  Tetanus Vaccine:    Vaccine Type: Tdap    Site: left deltoid    Mfr: GlaxoSmithKline    Dose: 0.5 ml    Route: IM    Given by: Lanier Prude, CMA(AAMA)    Exp. Date: 06/20/2012    Lot #: [id number]    VIS given: 07/19/08 version given June 04, 2010.  Marland Kitchenlbflu   Flu Vaccine Consent Questions     Do you have a history of severe allergic reactions to this vaccine? no    Any prior history of allergic reactions to egg and/or gelatin? no    Do you have a sensitivity to the preservative Thimersol? no    Do you have a past history of Guillan-Barre  Syndrome? no    Do you currently have an acute febrile illness? no    Have you ever had a severe reaction to latex? no    Vaccine information given and explained to patient? yes    Are you currently pregnant? no    Lot Number:[id number]   Exp Date:03/01/2011   Site Given  Right Deltoid IM Lanier Prude, [facility])  June 04, 2010 4:17 PM

## 2010-10-01 NOTE — Progress Notes (Signed)
Summary: Nuc. Pre-Procedure  Phone Note Outgoing Call Call back at [facility] Phone [phone]   Call placed by: Irean Hong, RN,  June 24, 2010 2:50 PM Summary of Call: Reviewed information on [employer] Information Sheet (see scanned document for further details).  Spoke with wife per Allen Kell. Milena Liggett,RN.      Nuclear Med Background Indications for Stress Test: Evaluation for Ischemia, Abnormal EKG   History: COPD, Myocardial Perfusion Study  History Comments: '06 MPS:no ischemia, apical thinning, EF=57%.  Symptoms: Chest Pain with Exertion, DOE    Nuclear Pre-Procedure Cardiac Risk Factors: Family History - CAD, Hypertension, IDDM Type 2, Lipids, Obesity, Smoker Height (in): 70

## 2010-10-03 ENCOUNTER — Telehealth: Payer: Self-pay | Admitting: Internal Medicine

## 2010-10-03 NOTE — Assessment & Plan Note (Signed)
Summary: 3 MO ROV /NWS  #   Vital Signs:  Patient profile:   45 year old male Height:      70 inches Weight:      241 pounds BMI:     34.70 Temp:     98.1 degrees F oral Pulse rate:   84 / minute Pulse rhythm:   regular Resp:     16 per minute BP sitting:   118 / 84  (left arm) Cuff size:   regular  Vitals Entered By: Lanier Prude, CMA(AAMA) (September 11, 2010 2:48 PM) CC: 3 mo f/u  Is Patient Diabetic? Yes   Primary Care Provider:  Tresa Garter MD  CC:  3 mo f/u .  History of Present Illness: The patient presents for a follow up of hypertension, diabetes, hyperlipidemia, LBP C/o ED   Current Medications (verified): 1)  Morphine Sulfate Cr 100 Mg Xr12h-Tab (Morphine Sulfate) .... 2 By Mouth Three Times A Day Fill On or After 09/09/10 2)  Morphine Sulfate 15 Mg Tabs (Morphine Sulfate) .Marland Kitchen.. 1 By Mouth Two Times A Day As Needed Pain Breakthrough Fill On or After 09/05/2010 3)  Aspirin 325 Mg Tabs (Aspirin) .Marland Kitchen.. 1 Qd 4)  Vitamin D3 1000 Unit  Tabs (Cholecalciferol) .Marland Kitchen.. 1 Qd 5)  Klor-Con M20 20 Meq Tbcr (Potassium Chloride Crys Cr) .Marland Kitchen.. 1 By Mouth Once Daily Prn 6)  Lovastatin 20 Mg Tabs (Lovastatin) .Marland Kitchen.. 1 Qd 7)  Humalog Mix 75/25 Kwikpen 75-25 %  Susp (Insulin Lispro Prot & Lispro) .... 60 Units Every Morning, and 50 in The Evening 8)  Promethazine Hcl 25 Mg  Tabs (Promethazine Hcl) .Marland Kitchen.. 1 By Mouth Qid As Needed Nausea 9)  Clomid 50 Mg  Tabs (Clomiphene Citrate) .... 1/4 Tab Qd 10)  Demadex 100 Mg  Tabs (Torsemide) .Marland Kitchen.. 1 By Mouth Qam 11)  Azor 10-40 Mg  Tabs (Amlodipine-Olmesartan) .... Take 1 Tab By Mouth Every Day 12)  Bd U/f Mini Pen Needle 31g X 5 Mm Misc (Insulin Pen Needle) .... Use As Directed 13)  Onetouch Ultra Test  Strp (Glucose Blood) .... Use Two Times A Day 14)  Onetouch Ultrasoft Lancets  Misc (Lancets) .... Use As Directed 15)  Bystolic 10 Mg Tabs (Nebivolol Hcl) 16)  Cialis 20 Mg Tabs (Tadalafil) .Marland Kitchen.. 1 Once Daily As Needed 17)  Ketoconazole 2 %  Crea (Ketoconazole) .... Apply To Foreskin Two Times A Day For 10 Days  Allergies (verified): 1)  ! Nsaids 2)  Lotensin (Benazepril Hcl)  Past History:  Past Medical History: Last updated: 06/11/2007 Diabetes mellitus, type II Hyperlipidemia Hypertension Low back pain GERD  Social History: Last updated: 07/05/2010 Occupation: plumber Married Current Smoker 2 ppd Alcohol use-yes Drug use-no Regular exercise-no  Review of Systems       The patient complains of weight gain and dyspnea on exertion.  The patient denies weight loss, chest pain, and abdominal pain.    Physical Exam  General:  alert, well-developed, well-nourished, well-hydrated, appropriate dress, and overweight-appearing.   Eyes:  vision grossly intact and no injection.   Mouth:  Oral mucosa and oropharynx without lesions or exudates.  Teeth in good repair. Neck:  supple, full ROM, no masses, no thyromegaly, no thyroid nodules or tenderness, no JVD, normal carotid upstroke, no carotid bruits, no cervical lymphadenopathy, and no neck tenderness.   Lungs:  normal respiratory effort, no intercostal retractions, no accessory muscle use, normal breath sounds, no dullness, no fremitus, no crackles, and no wheezes.  Heart:  normal rate, regular rhythm, no murmur, no gallop, no rub, and no JVD.   Abdomen:  soft, non-tender, normal bowel sounds, no distention, no masses, no guarding, no rigidity, no rebound tenderness, no abdominal hernia, no inguinal hernia, no hepatomegaly, and no splenomegaly.   Msk:  Lumbar-sacral spine is tender to palpation over paraspinal muscles and painfull with the ROM Stiff ITs  Neurologic:  No cranial nerve deficits noted. Station and gait are normal. Plantar reflexes are down-going bilaterally. DTRs are symmetrical throughout. Sensory, motor and coordinative functions appear intact. Skin:  insulin injection sites at anterior abdomen are normal  Psych:  alert and cooperative; normal mood and  affect; normal attention span and concentration   Impression & Recommendations:  Problem # 1:  LOW BACK PAIN (ICD-724.2) Assessment Unchanged  His updated medication list for this problem includes:    Morphine Sulfate Cr 100 Mg Xr12h-tab (Morphine sulfate) .Marland Kitchen... 2 by mouth three times a day fill on or after 11/08/10    Morphine Sulfate 15 Mg Tabs (Morphine sulfate) .Marland Kitchen... 1 by mouth two times a day as needed pain breakthrough fill on or after 11/04/2010    Aspirin 325 Mg Tabs (Aspirin) .Marland Kitchen... 1 qd  Problem # 2:  OBESITY (ICD-278.00) Assessment: Deteriorated Discussed Ht: 70 (09/11/2010)   Wt: 241 (09/11/2010)   BMI: 34.70 (09/11/2010)  Problem # 3:  ERECTILE DYSFUNCTION, ORGANIC (ICD-607.84) Assessment: Deteriorated  The following medications were removed from the medication list:    Cialis 20 Mg Tabs (Tadalafil) .Marland Kitchen... 1 once daily as needed His updated medication list for this problem includes:    Cialis 5 Mg Tabs (Tadalafil) .Marland Kitchen... 1 by mouth qd  Problem # 4:  DIABETES MELLITUS, TYPE II (ICD-250.00) Assessment: Deteriorated  His updated medication list for this problem includes:    Aspirin 325 Mg Tabs (Aspirin) .Marland Kitchen... 1 qd    Humalog Mix 75/25 Kwikpen 75-25 % Susp (Insulin lispro prot & lispro) .Marland KitchenMarland KitchenMarland KitchenMarland Kitchen 60 units every morning, and 50 in the evening    Azor 10-40 Mg Tabs (Amlodipine-olmesartan) .Marland Kitchen... Take 1 tab by mouth every day  Orders: TLB-BMP (Basic Metabolic Panel-BMET) (80048-METABOL) TLB-A1C / Hgb A1C (Glycohemoglobin) (83036-A1C)  Problem # 5:  DEPRESSION (ICD-311) Assessment: Improved  Complete Medication List: 1)  Morphine Sulfate Cr 100 Mg Xr12h-tab (Morphine sulfate) .... 2 by mouth three times a day fill on or after 11/08/10 2)  Morphine Sulfate 15 Mg Tabs (Morphine sulfate) .Marland Kitchen.. 1 by mouth two times a day as needed pain breakthrough fill on or after 11/04/2010 3)  Aspirin 325 Mg Tabs (Aspirin) .Marland Kitchen.. 1 qd 4)  Vitamin D3 1000 Unit Tabs (Cholecalciferol) .Marland Kitchen.. 1 qd 5)   Klor-con M20 20 Meq Tbcr (Potassium chloride crys cr) .Marland Kitchen.. 1 by mouth once daily prn 6)  Lovastatin 20 Mg Tabs (Lovastatin) .Marland Kitchen.. 1 qd 7)  Humalog Mix 75/25 Kwikpen 75-25 % Susp (Insulin lispro prot & lispro) .... 60 units every morning, and 50 in the evening 8)  Promethazine Hcl 25 Mg Tabs (Promethazine hcl) .Marland Kitchen.. 1 by mouth qid as needed nausea 9)  Clomid 50 Mg Tabs (Clomiphene citrate) .... 1/4 tab qd 10)  Demadex 100 Mg Tabs (Torsemide) .Marland Kitchen.. 1 by mouth qam 11)  Azor 10-40 Mg Tabs (Amlodipine-olmesartan) .... Take 1 tab by mouth every day 12)  Bd U/f Mini Pen Needle 31g X 5 Mm Misc (Insulin pen needle) .... Use as directed 13)  Onetouch Ultra Test Strp (Glucose blood) .... Use two times a day 14)  Onetouch Ultrasoft Lancets Misc (Lancets) .... Use as directed 15)  Bystolic 10 Mg Tabs (Nebivolol hcl) 16)  Ketoconazole 2 % Crea (Ketoconazole) .... Apply to foreskin two times a day for 10 days 17)  Cialis 5 Mg Tabs (Tadalafil) .Marland Kitchen.. 1 by mouth qd  Patient Instructions: 1)  Please schedule a follow-up appointment in 2 months. Prescriptions: MORPHINE SULFATE 15 MG TABS (MORPHINE SULFATE) 1 by mouth two times a day as needed pain breakthrough fill on or after 11/04/2010  #60 x 0   Entered and Authorized by:   Tresa Garter MD   Signed by:   Tresa Garter MD on 09/11/2010   Method used:   Print then Give to Patient   RxID:   [id number] MORPHINE SULFATE CR 100 MG XR12H-TAB (MORPHINE SULFATE) 2 by mouth three times a day Fill on or after 11/08/10  #180 x 0   Entered and Authorized by:   Tresa Garter MD   Signed by:   Tresa Garter MD on 09/11/2010   Method used:   Print then Give to Patient   RxID:   [id number] MORPHINE SULFATE 15 MG TABS (MORPHINE SULFATE) 1 by mouth two times a day as needed pain breakthrough fill on or after 10/06/2010  #60 x 0   Entered and Authorized by:   Tresa Garter MD   Signed by:   Tresa Garter MD on 09/11/2010    Method used:   Print then Give to Patient   RxID:   [id number] MORPHINE SULFATE CR 100 MG XR12H-TAB (MORPHINE SULFATE) 2 by mouth three times a day Fill on or after 10/10/10  #180 x 0   Entered and Authorized by:   Tresa Garter MD   Signed by:   Tresa Garter MD on 09/11/2010   Method used:   Print then Give to Patient   RxID:   [id number] CIALIS 5 MG TABS (TADALAFIL) 1 by mouth qd  #30 x 11   Entered and Authorized by:   Tresa Garter MD   Signed by:   Tresa Garter MD on 09/11/2010   Method used:   Print then Give to Patient   RxID:   [id number]    Orders Added: 1)  Est. Patient Level IV [06301] 2)  TLB-BMP (Basic Metabolic Panel-BMET) [80048-METABOL] 3)  TLB-A1C / Hgb A1C (Glycohemoglobin) [60109-N2T]

## 2010-10-03 NOTE — Progress Notes (Signed)
Summary: RFs - Morphine  Phone Note Refill Request   Refills Requested: Medication #1:  MORPHINE SULFATE CR 100 MG XR12H-TAB 2 by mouth three times a day Fill on or after 08/09/10  Medication #2:  MORPHINE SULFATE 15 MG TABS 1 by mouth two times a day as needed pain breakthrough fill on or after 08/05/2010 Initial call taken by: Lamar Sprinkles, CMA,  September 03, 2010 9:51 AM  Follow-up for Phone Call        ok to ref if time Follow-up by: Tresa Garter MD,  September 04, 2010 11:48 PM  Additional Follow-up for Phone Call Additional follow up Details #1::        Rx's printed/signed by Dr. Debby Bud in AVP's absence. left detailed mess informing pt Rx's ready Additional Follow-up by: Lanier Prude, [facility]),  September 05, 2010 2:32 PM    New/Updated Medications: MORPHINE SULFATE CR 100 MG XR12H-TAB (MORPHINE SULFATE) 2 by mouth three times a day Fill on or after 09/09/10 MORPHINE SULFATE 15 MG TABS (MORPHINE SULFATE) 1 by mouth two times a day as needed pain breakthrough fill on or after 09/05/2010 Prescriptions: MORPHINE SULFATE 15 MG TABS (MORPHINE SULFATE) 1 by mouth two times a day as needed pain breakthrough fill on or after 09/05/2010  #60 x 0   Entered by:   Lanier Prude, CMA(AAMA)   Authorized by:   Tresa Garter MD   Signed by:   Lanier Prude, CMA(AAMA) on 09/05/2010   Method used:   Print then Give to Patient   RxID:   [id number] MORPHINE SULFATE CR 100 MG XR12H-TAB (MORPHINE SULFATE) 2 by mouth three times a day Fill on or after 09/09/10  #180 x 0   Entered by:   Lanier Prude, CMA(AAMA)   Authorized by:   Tresa Garter MD   Signed by:   Lanier Prude, [facility]) on 09/05/2010   Method used:   Print then Give to Patient   RxID:   [id number]

## 2010-10-03 NOTE — Progress Notes (Signed)
Summary: Cialis?   Phone Note Call from Patient Call back at Home Phone [phone]   Summary of Call: Patient is requesting to try daily cialis. Pharm is [facility].  Initial call taken by: Lamar Sprinkles, CMA,  August 16, 2010 10:51 AM  Follow-up for Phone Call        I would suggest Cialis as needed only Follow-up by: Tresa Garter MD,  August 16, 2010 1:00 PM  Additional Follow-up for Phone Call Additional follow up Details #1::        left mess to call office back............Marland KitchenLamar Sprinkles, CMA  August 16, 2010 3:11 PM   left mess on hm vm to call office back.  Additional Follow-up by: Lamar Sprinkles, CMA,  August 16, 2010 5:15 PM    Additional Follow-up for Phone Call Additional follow up Details #2::    Pt's insurance will only cover viagra 100mg  #28mthly. Advised he could try to take only half & get more doses for cost. Pt would like to try cialis 20mg  & try 1/2 tab at a time to get more w/rx. Unsure what qty ins will cover. Ok for cialis rx? ................Marland KitchenLamar Sprinkles, CMA  August 19, 2010 12:24 PM   Additional Follow-up for Phone Call Additional follow up Details #3:: Details for Additional Follow-up Action Taken: ok Thank you!  Additional Follow-up by: Tresa Garter MD,  August 19, 2010 5:23 PM  New/Updated Medications: CIALIS 20 MG TABS (TADALAFIL) 1 once daily as needed Prescriptions: CIALIS 20 MG TABS (TADALAFIL) 1 once daily as needed  #12 x 6   Entered by:   Lamar Sprinkles, CMA   Authorized by:   Tresa Garter MD   Signed by:   Lamar Sprinkles, CMA on 08/19/2010   Method used:   [facility]        [facility]  [place] [address] (retail[facility]       [address]       [place], [place]  [address]       Ph: [phone] or [phone]       Fax: [phone]   RxID:   [id number]

## 2010-10-09 NOTE — Progress Notes (Signed)
Summary: RFs  Phone Note Refill Request Call back at Home Phone [phone]   Refills Requested: Medication #1:  MORPHINE SULFATE CR 100 MG XR12H-TAB 2 by mouth three times a day Fill on or after 11/08/10  Medication #2:  MORPHINE SULFATE 15 MG TABS 1 by mouth two times a day as needed pain breakthrough fill on or after 11/04/2010 Initial call taken by: Lamar Sprinkles, CMA,  October 03, 2010 11:30 AM  Follow-up for Phone Call        ok to ref Follow-up by: Tresa Garter MD,  October 03, 2010 1:19 PM  Additional Follow-up for Phone Call Additional follow up Details #1::        I reviewed EMR records - pt was given rx for feb & march at last office visit on 09/11/10. Advised pt's wife of this - she said she would ask pt and look for rx.  Additional Follow-up by: Lamar Sprinkles, CMA,  October 03, 2010 3:03 PM

## 2010-11-11 ENCOUNTER — Other Ambulatory Visit: Payer: Self-pay | Admitting: Internal Medicine

## 2010-11-11 ENCOUNTER — Ambulatory Visit (INDEPENDENT_AMBULATORY_CARE_PROVIDER_SITE_OTHER): Payer: BC Managed Care – PPO | Admitting: Internal Medicine

## 2010-11-11 ENCOUNTER — Other Ambulatory Visit: Payer: BC Managed Care – PPO

## 2010-11-11 ENCOUNTER — Encounter: Payer: Self-pay | Admitting: Internal Medicine

## 2010-11-11 DIAGNOSIS — E119 Type 2 diabetes mellitus without complications: Secondary | ICD-10-CM

## 2010-11-11 DIAGNOSIS — I10 Essential (primary) hypertension: Secondary | ICD-10-CM

## 2010-11-11 DIAGNOSIS — N529 Male erectile dysfunction, unspecified: Secondary | ICD-10-CM

## 2010-11-11 LAB — HEMOGLOBIN A1C: Hgb A1c MFr Bld: 12.4 % — ABNORMAL HIGH (ref 4.6–6.5)

## 2010-11-11 LAB — BASIC METABOLIC PANEL
BUN: 16 mg/dL (ref 6–23)
CO2: 26 mEq/L (ref 19–32)
Calcium: 9.1 mg/dL (ref 8.4–10.5)
Chloride: 97 mEq/L (ref 96–112)
Creatinine, Ser: 0.8 mg/dL (ref 0.4–1.5)
GFR: 105.86 mL/min (ref >60.00)
Glucose, Bld: 271 mg/dL — ABNORMAL HIGH (ref 70–99)
Potassium: 3.8 mEq/L (ref 3.5–5.1)
Sodium: 133 mEq/L — ABNORMAL LOW (ref 135–145)

## 2010-11-11 LAB — TESTOSTERONE: Testosterone: 60.16 ng/dL — ABNORMAL LOW (ref 350.00–890.00)

## 2010-11-16 LAB — POCT CARDIAC MARKERS
CKMB, poc: 2.3 ng/mL (ref 1.0–8.0)
CKMB, poc: 2.6 ng/mL (ref 1.0–8.0)
Troponin i, poc: <0.05 ng/mL (ref 0.00–0.09)
Troponin i, poc: <0.05 ng/mL (ref 0.00–0.09)

## 2010-11-16 LAB — POCT I-STAT, CHEM 8
Chloride: 100 mEq/L (ref 96–112)
Glucose, Bld: 122 mg/dL — ABNORMAL HIGH (ref 70–99)
HCT: 50 % (ref 39.0–52.0)
Potassium: 4.2 mEq/L (ref 3.5–5.1)

## 2010-11-19 NOTE — Assessment & Plan Note (Signed)
Summary: 2 MO FU / LB / NWS   Vital Signs:  Patient profile:   45 year old male Height:      70 inches (177.80 cm) Weight:      232.25 pounds (105.57 kg) BMI:     33.44 O2 Sat:      97 % on Room air Temp:     98.3 degrees F (36.83 degrees C) oral Pulse rate:   97 / minute Resp:     16 per minute BP sitting:   122 / 68  (left arm) Cuff size:   regular  Vitals Entered By: Burnard Leigh CMA(AAMA) (November 11, 2010 3:32 PM)  O2 Flow:  Room air CC: 39-month F/U.sls,cma Is Patient Diabetic? Yes Comments Pt needs refill on Morphine Sulfate medications   Primary Care Provider:  Tresa Garter MD  CC:  21-month F/U.sls and cma.  History of Present Illness: The patient presents for a follow up of hypertension, diabetes, hyperlipidemia and LBP  Current Medications (verified): 1)  Morphine Sulfate Cr 100 Mg Xr12h-Tab (Morphine Sulfate) .... 2 By Mouth Three Times A Day Fill On or After 11/08/10 2)  Morphine Sulfate 15 Mg Tabs (Morphine Sulfate) .Marland Kitchen.. 1 By Mouth Two Times A Day As Needed Pain Breakthrough Fill On or After 11/04/2010 3)  Aspirin 325 Mg Tabs (Aspirin) .Marland Kitchen.. 1 Qd 4)  Vitamin D3 1000 Unit  Tabs (Cholecalciferol) .Marland Kitchen.. 1 Qd 5)  Klor-Con M20 20 Meq Tbcr (Potassium Chloride Crys Cr) .Marland Kitchen.. 1 By Mouth Once Daily Prn 6)  Lovastatin 20 Mg Tabs (Lovastatin) .Marland Kitchen.. 1 Qd 7)  Humalog Mix 75/25 Kwikpen 75-25 %  Susp (Insulin Lispro Prot & Lispro) .... 60 Units Every Morning, and 50 in The Evening 8)  Promethazine Hcl 25 Mg  Tabs (Promethazine Hcl) .Marland Kitchen.. 1 By Mouth Qid As Needed Nausea 9)  Clomid 50 Mg  Tabs (Clomiphene Citrate) .... 1/4 Tab Qd 10)  Demadex 100 Mg  Tabs (Torsemide) .Marland Kitchen.. 1 By Mouth Qam 11)  Azor 10-40 Mg  Tabs (Amlodipine-Olmesartan) .... Take 1 Tab By Mouth Every Day 12)  Bd U/f Mini Pen Needle 31g X 5 Mm Misc (Insulin Pen Needle) .... Use As Directed 13)  Onetouch Ultra Test  Strp (Glucose Blood) .... Use Two Times A Day 14)  Onetouch Ultrasoft Lancets  Misc (Lancets) ....  Use As Directed 15)  Bystolic 10 Mg Tabs (Nebivolol Hcl) 16)  Ketoconazole 2 % Crea (Ketoconazole) .... Apply To Foreskin Two Times A Day For 10 Days 17)  Cialis 5 Mg Tabs (Tadalafil) .Marland Kitchen.. 1 By Mouth Qd  Allergies (verified): 1)  ! Nsaids 2)  Lotensin (Benazepril Hcl)  Past History:  Past Medical History: Last updated: 06/11/2007 Diabetes mellitus, type II Hyperlipidemia Hypertension Low back pain GERD  Social History: Last updated: 07/05/2010 Occupation: plumber Married Current Smoker 2 ppd Alcohol use-yes Drug use-no Regular exercise-no  Review of Systems       The patient complains of dyspnea on exertion.  The patient denies fever, hoarseness, chest pain, and abdominal pain.    Physical Exam  Head:  normocephalic, atraumatic, no abnormalities observed, and no abnormalities palpated.   Mouth:  Oral mucosa and oropharynx without lesions or exudates.  Teeth in good repair. Lungs:  normal respiratory effort, no intercostal retractions, no accessory muscle use, normal breath sounds, no dullness, no fremitus, no crackles, and no wheezes.   Heart:  normal rate, regular rhythm, no murmur, no gallop, no rub, and no JVD.   Abdomen:  soft, non-tender, normal bowel sounds, no distention, no masses, no guarding, no rigidity, no rebound tenderness, no abdominal hernia, no inguinal hernia, no hepatomegaly, and no splenomegaly.   Msk:  Lumbar-sacral spine is tender to palpation over paraspinal muscles and painfull with the ROM Stiff ITs  Neurologic:  No cranial nerve deficits noted. Station and gait are normal. Plantar reflexes are down-going bilaterally. DTRs are symmetrical throughout. Sensory, motor and coordinative functions appear intact. Skin:  insulin injection sites at anterior abdomen are normal  Psych:  alert and cooperative; normal mood and affect; normal attention span and concentration   Impression & Recommendations:  Problem # 1:  DIABETES MELLITUS, TYPE II  (ICD-250.00) Assessment Unchanged  Risks of noncompliance with treatment discussed. He doesen't seem to be able to make things better with his diet etc. His updated medication list for this problem includes:    Aspirin 325 Mg Tabs (Aspirin) .Marland Kitchen... 1 qd    Humalog Mix 75/25 Kwikpen 75-25 % Susp (Insulin lispro prot & lispro) .Marland KitchenMarland KitchenMarland KitchenMarland Kitchen 60 units every morning, and 50 in the evening    Azor 10-40 Mg Tabs (Amlodipine-olmesartan) .Marland Kitchen... Take 1 tab by mouth every day  Orders: TLB-A1C / Hgb A1C (Glycohemoglobin) (83036-A1C) TLB-BMP (Basic Metabolic Panel-BMET) (80048-METABOL)  Problem # 2:  LOW BACK PAIN (ICD-724.2) Assessment: Unchanged  His updated medication list for this problem includes:    Morphine Sulfate Cr 100 Mg Xr12h-tab (Morphine sulfate) .Marland Kitchen... 2 by mouth three times a day fill on or after 01/08/11    Morphine Sulfate 15 Mg Tabs (Morphine sulfate) .Marland Kitchen... 1 by mouth two times a day as needed pain breakthrough fill on or after 01/04/2011    Aspirin 325 Mg Tabs (Aspirin) .Marland Kitchen... 1 qd  Problem # 3:  DEPRESSION (ICD-311) Assessment: Improved  Problem # 4:  HYPERTENSION (ICD-401.9) Assessment: Unchanged  His updated medication list for this problem includes:    Demadex 100 Mg Tabs (Torsemide) .Marland Kitchen... 1 by mouth qam    Azor 10-40 Mg Tabs (Amlodipine-olmesartan) .Marland Kitchen... Take 1 tab by mouth every day    Bystolic 10 Mg Tabs (Nebivolol hcl)  BP today: 122/68 Prior BP: 118/84 (09/11/2010)  Labs Reviewed: K+: 4.2 (09/11/2010) Creat: : 0.7 (09/11/2010)   Chol: 187 (06/04/2010)   HDL: 47.80 (06/04/2010)   LDL: 103 (06/04/2010)   TG: 179.0 (06/04/2010)  Orders: TLB-A1C / Hgb A1C (Glycohemoglobin) (83036-A1C) TLB-BMP (Basic Metabolic Panel-BMET) (80048-METABOL)  Problem # 5:  HYPOGONADISM, MALE (ICD-257.2) Assessment: Comment Only On the regimen of medicine(s) reflected in the chart    Problem # 6:  TOBACCO USE DISORDER/SMOKER-SMOKING CESSATION DISCUSSED (ICD-305.1) Assessment: Unchanged  His  updated medication list for this problem includes:    Chantix Continuing Month Pak 1 Mg Tabs (Varenicline tartrate) .Marland Kitchen... As dirrected    Chantix Starting Month Pak 0.5 Mg X 11 & 1 Mg X 42 Tabs (Varenicline tartrate) .Marland Kitchen... As dirrected Risks vs benefits and controversies of Chantix use were discussed.   Complete Medication List: 1)  Morphine Sulfate Cr 100 Mg Xr12h-tab (Morphine sulfate) .... 2 by mouth three times a day fill on or after 01/08/11 2)  Morphine Sulfate 15 Mg Tabs (Morphine sulfate) .Marland Kitchen.. 1 by mouth two times a day as needed pain breakthrough fill on or after 01/04/2011 3)  Aspirin 325 Mg Tabs (Aspirin) .Marland Kitchen.. 1 qd 4)  Vitamin D3 1000 Unit Tabs (Cholecalciferol) .Marland Kitchen.. 1 qd 5)  Klor-con M20 20 Meq Tbcr (Potassium chloride crys cr) .Marland Kitchen.. 1 by mouth once daily prn 6)  Lovastatin 20  Mg Tabs (Lovastatin) .Marland Kitchen.. 1 qd 7)  Humalog Mix 75/25 Kwikpen 75-25 % Susp (Insulin lispro prot & lispro) .... 60 units every morning, and 50 in the evening 8)  Promethazine Hcl 25 Mg Tabs (Promethazine hcl) .Marland Kitchen.. 1 by mouth qid as needed nausea 9)  Clomid 50 Mg Tabs (Clomiphene citrate) .... 1/4 tab qd 10)  Demadex 100 Mg Tabs (Torsemide) .Marland Kitchen.. 1 by mouth qam 11)  Azor 10-40 Mg Tabs (Amlodipine-olmesartan) .... Take 1 tab by mouth every day 12)  Bd U/f Mini Pen Needle 31g X 5 Mm Misc (Insulin pen needle) .... Use as directed 13)  Onetouch Ultra Test Strp (Glucose blood) .... Use two times a day 14)  Onetouch Ultrasoft Lancets Misc (Lancets) .... Use as directed 15)  Bystolic 10 Mg Tabs (Nebivolol hcl) 16)  Ketoconazole 2 % Crea (Ketoconazole) .... Apply to foreskin two times a day for 10 days 17)  Cialis 20 Mg Tabs (Tadalafil) .... 1/2 or 1 by mouth q 1-3 d prn 18)  Chantix Continuing Month Pak 1 Mg Tabs (Varenicline tartrate) .... As dirrected 19)  Chantix Starting Month Pak 0.5 Mg X 11 & 1 Mg X 42 Tabs (Varenicline tartrate) .... As dirrected  Other Orders: TLB-Testosterone, Total (84403-TESTO)  Patient  Instructions: 1)  Please schedule a follow-up appointment in 2 months. Prescriptions: CHANTIX STARTING MONTH PAK 0.5 MG X 11 & 1 MG X 42 TABS (VARENICLINE TARTRATE) as dirrected  #1 x 0   Entered and Authorized by:   Tresa Garter MD   Signed by:   Tresa Garter MD on 11/11/2010   Method used:   Print then Give to Patient   RxID:   [id number] CHANTIX CONTINUING MONTH PAK 1 MG TABS (VARENICLINE TARTRATE) as dirrected  #1 x 5   Entered and Authorized by:   Tresa Garter MD   Signed by:   Tresa Garter MD on 11/11/2010   Method used:   Print then Give to Patient   RxID:   [id number] MORPHINE SULFATE 15 MG TABS (MORPHINE SULFATE) 1 by mouth two times a day as needed pain breakthrough fill on or after 01/04/2011  #60 x 0   Entered and Authorized by:   Tresa Garter MD   Signed by:   Tresa Garter MD on 11/11/2010   Method used:   Print then Give to Patient   RxID:   [id number] MORPHINE SULFATE CR 100 MG XR12H-TAB (MORPHINE SULFATE) 2 by mouth three times a day Fill on or after 01/08/11  #180 x 0   Entered and Authorized by:   Tresa Garter MD   Signed by:   Tresa Garter MD on 11/11/2010   Method used:   Print then Give to Patient   RxID:   [id number] MORPHINE SULFATE 15 MG TABS (MORPHINE SULFATE) 1 by mouth two times a day as needed pain breakthrough fill on or after 12/05/2010  #60 x 0   Entered and Authorized by:   Tresa Garter MD   Signed by:   Tresa Garter MD on 11/11/2010   Method used:   Print then Give to Patient   RxID:   [id number] MORPHINE SULFATE CR 100 MG XR12H-TAB (MORPHINE SULFATE) 2 by mouth three times a day Fill on or after 12/09/10  #180 x 0   Entered and Authorized by:   Tresa Garter MD   Signed by:   Tresa Garter MD on 11/11/2010   Method  used:   Print then Give to Patient   RxID:   [id number] CIALIS 20 MG TABS (TADALAFIL) 1/2 or 1 by mouth q 1-3 d prn  #6 x  12   Entered and Authorized by:   Tresa Garter MD   Signed by:   Tresa Garter MD on 11/11/2010   Method used:   Print then Give to Patient   RxID:   [id number]    Orders Added: 1)  TLB-A1C / Hgb A1C (Glycohemoglobin) [83036-A1C] 2)  TLB-BMP (Basic Metabolic Panel-BMET) [80048-METABOL] 3)  Est. Patient Level IV [84696] 4)  TLB-Testosterone, Total [84403-TESTO]

## 2010-11-25 ENCOUNTER — Ambulatory Visit (INDEPENDENT_AMBULATORY_CARE_PROVIDER_SITE_OTHER): Payer: BC Managed Care – PPO | Admitting: Endocrinology

## 2010-11-25 ENCOUNTER — Encounter: Payer: Self-pay | Admitting: Endocrinology

## 2010-11-25 DIAGNOSIS — E291 Testicular hypofunction: Secondary | ICD-10-CM

## 2010-11-25 DIAGNOSIS — E119 Type 2 diabetes mellitus without complications: Secondary | ICD-10-CM

## 2010-11-25 MED ORDER — TESTOSTERONE 50 MG/5GM (1%) TD GEL: 5.0000 g | Freq: Every day | TRANSDERMAL | Status: DC

## 2010-11-25 MED ORDER — INSULIN LISPRO PROT & LISPRO (75-25 MIX) 100 UNIT/ML ~~LOC~~ SUSP: SUBCUTANEOUS | Status: DC

## 2010-11-25 NOTE — Progress Notes (Signed)
  Subjective:    Patient ID: [name] [name], male    DOB: 02-28-1966, 45 y.o.   MRN: [id number]  HPI The state of at least three ongoing medical problems is addressed today: Dm:  Pt says he takes insulin as rx'ed, but diet has not been good.  no cbg record, but states cbg's are 200's.  No hypoglycemic sxs.  Hypogonadism:  He says he has severe fatigue, but says this could be due to dm also.  Ed: sxs persist. Past Medical History  Diagnosis Date  . Diabetes mellitus   . Hyperlipidemia   . Hypertension   . GERD (gastroesophageal reflux disease)   . Lower back pain    No past surgical history on file.  reports that he has been smoking Cigarettes.  He has been smoking about 2 packs per day. He does not have any smokeless tobacco history on file. He reports that he does not drink alcohol or use illicit drugs. family history includes Heart disease in his other. Allergies  Allergen Reactions  . Benazepril Hcl     REACTION: cough  . Nsaids       Review of Systems He has lost a few lbs, due to his efforts.  He has slight doe.    Objective:   Physical Exam    GENERAL: no distress Pulses: dorsalis pedis intact bilat.   Feet: no deformity.  no ulcer on the feet.  feet are of normal color and temp.  no edema Neuro: sensation is intact to touch on the feet  a1c and testosterone are noted  Assessment & Plan:  Dm, needs increased rx Hypogonadism, needs increased rx

## 2010-11-25 NOTE — Patient Instructions (Addendum)
good diet and exercise habits significanly improve the control of your diabetes.  please let me know if you wish to be referred to a dietician.  high blood sugar is very risky to your health.  you should see an eye doctor every year. controlling your blood pressure and cholesterol drastically reduces the damage diabetes does to your body.  this also applies to quitting smoking.  please discuss these with your doctor.  you should take an aspirin every day, unless you have been advised by a doctor not to. check your blood sugar 2 times a day.  vary the time of day when you check, between before the 3 meals, and at bedtime.  also check if you have symptoms of your blood sugar being too high or too low.  please keep a record of the readings and bring it to your next appointment here.  please call us sooner if you are having low blood sugar episodes. Increase insulin to 70 units with breakfast, and 60 with the evening meal. Start testim 5 grams daily. Please return here in 1 month.

## 2010-12-12 ENCOUNTER — Telehealth: Payer: Self-pay | Admitting: Endocrinology

## 2010-12-12 NOTE — Telephone Encounter (Signed)
PA-Testim gel Ref # [id number]  PA form faxed to [facility], awaiting approval.

## 2011-01-14 ENCOUNTER — Ambulatory Visit: Payer: BC Managed Care – PPO | Admitting: Internal Medicine

## 2011-01-14 NOTE — Consult Note (Signed)
NAME:  [name] [name], [name] [name]              ACCOUNT NO.:  [id number]   MEDICAL RECORD NO.:  [id number]          PATIENT TYPE:  OBV   LOCATION:  4703                         FACILITY:  [facility]   PHYSICIAN:  Arturo Morton. Riley Kill, MD, FACCDATE OF BIRTH:  07-Jan-1966   DATE OF CONSULTATION:  DATE OF DISCHARGE:                                 CONSULTATION   PRIMARY CARDIOLOGIST:  Luis Abed, MD, [facility]   PRIMARY CARE PHYSICIAN:  Georgina Quint. Plotnikov, MD   REQUESTING PHYSICIAN:  Raenette Rover. Felicity Coyer, MD   REASON FOR CONSULTATION:  Chest pain.   HISTORY OF PRESENT ILLNESS:  A 45 year old obese Caucasian male with a  history of hypertension, diabetes, tobacco abuse, COPD, chronic back  pain, narcotic dependency who presented to the emergency room after  experiencing what he described as cramping chest pain lasting  approximately 2-3 hours yesterday.  It went away on its own without any  associated symptoms of nausea, vomiting, diaphoresis, or shortness of  breath.  However, after it went away several minutes later, the patient  was sitting at a table, got up to walk into the kitchen, and began to  feel lightheaded and dizzy and had some diaphoresis.  As a result of  this, the patient called Dr. Loren Racer office, who referred him to the  [facility].  He describes the pain also as dull, but it  was nonradiating.  He states that he had never had pain like this  before.   REVIEW OF SYSTEMS:  Positive for diaphoresis, chest pain, and  lightheadedness.  The patient also has chronic back pain and GERD.   PAST MEDICAL HISTORY:  1. Diabetes, not well-controlled.  2. GERD.  3. Hypertension.  4. Heavy tobacco abuse.  5. Possible depression.  6. BOOP/COPD.  7. Chronic back pain.  8. Testosterone deficiency.  9. Recent illness with bronchitis last week.  10.Nephrolithiasis.  11.Questionable drug-seeking behavior.  The patient also had a nuclear      medicine stress test on November  2006, which was normal per Dr.      Myrtis Ser.   PAST SURGICAL HISTORY:  Left knee surgery and vasectomy.   SOCIAL HISTORY:  He lives in [place] with his wife.  He is a  Nutritional therapist.  He is a 47-pack year smoker.  Negative for EtOH or drug use.   FAMILY HISTORY:  Mother with diabetes and hypertension.  Father with  unknown history as he does not know his father.  He has sister who is in  good health.   CURRENT MEDICATIONS:  1. Aspirin 81 mg daily.  2. Clomiphene 12.5 mg daily.  3. Doxycycline 100 mg b.i.d.  4. Low-molecular-weight heparin.  5. Advair.  6. Insulin per sliding scale.  NovoLog insulin 50 units daily, NovoLog      insulin 30 units q.p.m.  7. Atrovent inhaler.  8. Bystolic 10 mg at bedtime.  9. Nicotine 21 mg patch daily.  10.Oxycodone 100 mg q.6 h.  11.Zocor 10 mg daily.   ALLERGIES:  NSAIDS and COX-2 INHIBITORS.   CURRENT LABS:  Sodium 135, potassium 3.7, chloride  97, CO2 30, BUN 24,  creatinine 1.1, and glucose 126.  Hemoglobin 13.7, hematocrit 40.4,  white blood cells 10.8, and platelets 224.  TSH 6.13, d-dimer is 0.24,  hemoglobin 9.4, and BNP less than 30.0.  Troponin less than 0.05, 0.01,  and 0.01 respectively with a CK max at 243.  PTT 29, PT 14.0, and INR  1.1.  Total cholesterol 137, triglycerides 298, HDL 98, and LDL 49.  Chest x-ray revealing cardiomegaly without evidence of cardiopulmonary  disease.  EKG revealing normal sinus rhythm, ventricular rate is 69  beats per minute with PVCs.   PHYSICAL EXAMINATION:  VITAL SIGNS:  Blood pressure 112/67, pulse 60,  respirations 17, temperature 97.8, and O2 sat 98% on room air.  HEENT:  Head is normocephalic and atraumatic.  EYES:  PERRLA.  Mucous membranes, mouth pink and moist.  Tongue is  midline.  NECK:  Obese, supple.  No JVD.  No carotid bruits appreciated.  No  lymphadenopathy is noted.  CARDIOVASCULAR:  Regular rate and rhythm without murmurs, rubs, or  gallops.  Pulses are 2+ and equal without  bruits.  LUNGS:  Clear to auscultation without wheezes, rales, or rhonchi.  ABDOMEN:  Obese, nontender with 2+ bowel sounds.  EXTREMITIES:  Without clubbing, cyanosis, or edema.  NEURO:  Cranial nerves II-XII are grossly intact.   IMPRESSION:  1. Chest pain, negative cardiac enzymes, and EKG.  2. Chronic back pain with narcotic dependence.  3. Ongoing tobacco abuse, cessation recommended.  4. Hypertension.  5. Chronic obstructive pulmonary disease.  6. Diabetes, not well-controlled.   PLAN:  This is a 45 year old obese Caucasian male with above-mentioned  history with multiple cardiovascular risk factors to include diabetes,  hypertension, obesity, and tobacco abuse, who presented to the hospital  ER secondary to cramping chest pain lasting 2-3 hours.  His EKG is  negative.  Cardiac enzymes were also negative.  The patient is anxious  to go home; however, after being seen by Dr. Riley Kill it has been decided  the patient will stay overnight and have a routine stress test, non-  nuclear study to evaluate for exercise tolerance and ischemic changes  during exercise.  If this is positive, the patient will be scheduled for cardiac  catheterization.  In the interim, the patient has been advised to stop  smoking and have better diabetic control.  We will make further  recommendations throughout hospital course based upon the patient's  response to treatment.      Bettey Mare. Lyman Bishop, NP      Arturo Morton. Riley Kill, MD, [facility]  Electronically Signed    KML/MEDQ  D:  05/10/2008  T:  05/11/2008  Job:  [id number]   cc:   Georgina Quint. Plotnikov, MD

## 2011-01-14 NOTE — Discharge Summary (Signed)
[name] [name], [name] [name]              ACCOUNT NO.:  [id number]   MEDICAL RECORD NO.:  [id number]          PATIENT TYPE:  OBV   LOCATION:  4703                         FACILITY:  [facility]   PHYSICIAN:  Sandford Craze, NP DATE OF BIRTH:  09/10/1965   DATE OF ADMISSION:  05/09/2008  DATE OF DISCHARGE:  05/11/2008                               DISCHARGE SUMMARY   DISCHARGE DIAGNOSES:  1. Atypical chest pain with negative cardiac enzymes, plan for      outpatient stress test.  2. Hypertension.  3. Hyperlipidemia.  4. Diabetes type 2.  5. Hypertriglyceridemia.  6. Chronic obstructive pulmonary disease.  7. Ongoing tobacco abuse.  8. Depression.   HISTORY OF PRESENT ILLNESS:  [name] [name] is a 45 year old male who was  admitted on May 09, 2008, with chief complaint of chest pain.  This  pain lasted 2-3 hours, was associated with sweating and lightheadedness.  He describes the pain as cramping like in nature.  He was admitted for  further evaluation and treatment.   PAST MEDICAL HISTORY:  1. Diabetes type 2.  2. GERD.  3. Hypertension.  4. History of tobacco abuse.  5. Depression.  6. Croup/COPD.  7. Chronic back pain.  8. Testosterone deficiency.  9. Recent illness with bronchitis, on antibiotics.  10.Nephrolithiasis.  11.Question of drug-seeking behavior in the past.   COURSE OF HOSPITALIZATION:  Atypical chest pain.  The patient was  admitted.  He underwent serial cardiac enzymes, which were negative.  He  was seen in consultation by [facility] Cardiology and an inpatient exercise  stress test was attempted.  He was unable to complete this test due to  knee and back pain.  At this time, Cardiology's recommendations are to  perform an outpatient adenosine Cardiolite, and he was cleared from  their standpoint for discharge to home.   MEDICATIONS AT TIME OF DISCHARGE:  1. Azor 10/40 1 tab p.o. daily.  2. Bystolic 10 mg p.o. daily.  3. Demadex 100 mg p.o. daily.  4. Humulin  insulin 75/30, 75 units subcu in the morning and 30 units      subcu in the evening.  5. Potassium chloride 20 mEq p.o. daily.  6. Doxycycline 100 mg p.o. b.i.d.  7. Aspirin 81 mg p.o. daily.  8. Cymbalta 60 mg p.o. daily.  9. Mevacor 20 mg p.o. daily.  10.Advair 50/100 one puff daily.  11.__________ 12.5 mg p.o. daily.  12.OxyContin 100 mg p.o. q.6 h.   DISPOSITION:  The patient was discharged to home.   FOLLOWUP:  He was instructed to follow up with Dr. Myrtis Ser on May 31, 2008, at 11:45 a.m. and to follow up for an adenosine stress test on  May 16, 2008, at 7:45 p.m. at [facility].   PERTINENT LABORATORIES:  At the time of discharge, serial cardiac  enzymes are negative.  Creatinine 1.1, hemoglobin 13.7, and  triglycerides 298.   Greater than 30 minutes were spent on discharge planning.      Sandford Craze, NP     MO/MEDQ  D:  05/11/2008  T:  05/12/2008  Job:  [id number]  cc:   Georgina Quint. Plotnikov, MD

## 2011-01-14 NOTE — H&P (Signed)
NAME:  [name] [name], [name] [name]              ACCOUNT NO.:  [id number]   MEDICAL RECORD NO.:  [id number]          PATIENT TYPE:  EMS   LOCATION:  MAJO                         FACILITY:  [facility]   PHYSICIAN:  Michiel Cowboy, MDDATE OF BIRTH:  [birth year]   DATE OF ADMISSION:  05/09/2008  DATE OF DISCHARGE:                              HISTORY & PHYSICAL   PRIMARY CARE Nayson Traweek:  Georgina Quint. Plotnikov, MD   CHIEF COMPLAINT:  Chest pain.   HISTORY OF PRESENT ILLNESS:  The patient is a 45 year old gentleman,  history of hypertension, diabetes, heavy smoking use, who presents to  the emergency department with history of chest pain and presyncope  today.  The patient had an episode of chest pain in the morning that was  on the left side of his chest and in the center in 2 spots.  Felt like  kind of a dull ache over this area.  He thought it may be a muscle  spasm.  Was not worse with breathing.  The patient has some chronic  shortness of breath, which is worse for the past couple of weeks, which  he attributes to bronchitis.  The pain continued for about 3-4 hours.  He took aspirin.  He is not sure if aspirin helped the pain, or if the  pain went away before he even took the aspirin.  Thereafter, he was  sitting at the dining room table, got up to make himself a sandwich and  shortly thereafter felt lightheaded, tingly all over and felt like he  was about to pass out, although he never lost consciousness.  Once he  sat down, he felt better.  He did have some diaphoresis during this  episode, which prompted him to come to emergency department.  Per note,  he had a stress test done years ago which, per the patient, is negative.  He still continues to smoke 2 packs per day.  [facility] hospitalist called  to admit the patient for [place].   REVIEW OF SYSTEMS:  Significant for occasional shortness of breath,  particularly with exertion and also sometimes short of breath when he  lays down.  Some swelling  and lower extremity edema.  No nausea, no  vomiting.  Occasional constipation.  No fevers.  No chills.  Otherwise  unremarkable except for HPI.  Twelve systems were reviewed.   PAST MEDICAL HISTORY:  Significant for:  1. GERD.  2. History of diabetes.  3. History of hypertension.  4. History of hyperlipidemia.  5,  History of possible depression.  1. Also in the past, BOOP.  2. History of chronic back pain.  3. Testosterone deficiency for which he takes Clomid.   SOCIAL HISTORY:  Still smokes 2 packs per day.  Denies any alcohol use.  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   FAMILY HISTORY:  Significant for mother with diabetes.  No early heart  disease.   ALLERGIES:  NSAIDS.   MEDICATIONS:  1. Azor 10/40 mg p.o. q.a.m.  2. Bystolic 10 mg p.o. q.p.m.  3. Demadex 100 mg q.a.m.  4. Humulin insulin, unsure what  his dosage is, per wife 75/30 units,      75 in the morning and 30 at night.  5. Potassium chloride 20 mEq daily.  6. Clomid 12.5 mg p.o. daily.  7. Doxycycline 100 mg  p.o. b.i.d.  8. Aspirin 81 mg p.o. daily.  9. Cymbalta 30 mg p.o. daily.  10.Mevacor 20 mg p.o. per day.  11.Advair 50/100 mg p.o. inhaled only once a day.  12.OxyContin 100 mg p.o. q.6 h.   PHYSICAL EXAMINATION:  VITAL SIGNS:  Temperature 97.6, heart rate 72,  respirations 22, blood pressure 143/67.  Patient satting 98% on room  air.  Obese male, appears to be in no acute distress.  HEAD:  Nontraumatic.  Somewhat dry mucous membranes.  No JVD noted.  NECK:  Supple.  No lymphadenopathy noted.  LUNGS:  Distant breath sounds and crackles throughout.  Seem to be  coarse crackles.  HEART:  Regular rate and rhythm, but distant breath sounds.  ABDOMEN:  Obese, but nontender, nondistended, soft.  LOWER EXTREMITIES:  Without edema, clubbing or cyanosis.  NEUROLOGIC:  Cranial nerves 2-12 intact.  Strength 5/5 in all 4  extremities.  Otherwise neurological exam unremarkable.   LABS:  White blood cell count  11.6, hemoglobin of 15.3, sodium 132,  potassium 4.0, creatinine 1.1, troponin less than 0.05.  Myoglobin 2.3.  CK-MB 2.6.   EKG days showing normal sinus rhythm with possible early repolar in lead  aVL, V3, aVF with T-wave inversion in V1 which appears to be unchanged  from prior.  Otherwise unremarkable.   Chest x-ray showing cardiomegaly.   ASSESSMENT AND PLAN:  1. This is a 45 year old gentleman with chest pain and multiple risk      factors.  Will admit for further observation.  Will evaluate for      possible myocardial infarction with serial cardiac markers.      Electrocardiograms, put on telemetry.  Further risk stratified with      a fasting lipid panel, hemoglobin A1c, TSH.  Obtain BNP.  Given      that the patient had cardiomegaly on the chest x-ray, last echogram      was in 2006 with normal ejection fraction with some diastolic      dysfunction.  Will repeat two-dimensional echocardiogram to see if      worsening dilatory cardiomyopathy.  Will also obtain D-dimer, although pulmonary embolus less likely and  shortness of breath is likely secondary to chronic obstructive pulmonary  disease.  Will continue aspirin, statin, beta blocker.  Recommend  tobacco cessation.  Will need stress test either soon as an outpatient  or during this hospitalization, as the patient does have many risk  factors.  1. Presyncope, wonder if this is orthostatic hypotension perhaps      secondary to over-diuresis, Demadex.  Will check orthostatics.      Hold Demadex. The patient appears to on the dry side.  Probably      will need to restart diuretics soon thereafter to avoid diastolic      heart failure exacerbation.  2. Slight hyponatremia.  Wonder if again this is secondary to over      diuresis.  Will check orthostatics, hold Demadex and will follow.      If persists, may need further evaluation.  3. Tobacco abuse. Was counseled about this.  4. Possible chronic obstructive pulmonary  disease.  Will do Advair,      Atrovent and albuterol.  The patient does need to quit smoking  in      order for him to do better.  Will put on nicotine patch while in-      house.  5. Hypertension.  Could not rule out cardiac event.  Will hold calcium      channel blocker, but will continue Bystolic and olmesartan.      Continue to follow.  6. Hyperlipidemia.  Continue statins.  Check fasting lipid panel.  7. Prophylaxis.  Protonix, Lovenox.  8. Depression.  Continue Cymbalta.  9. Diabetes.  Will do sliding scale insulin, slightly decrease dose of      NPH since the patient may have poor p.o. intake while in-house and      watch sugars carefully.  10.Concerning chronic back pain, will continue OxyContin.  11.Concerning possible muscle spasms, will check electrolytes,      including magnesium and calcium.      Michiel Cowboy, MD  Electronically Signed     AVD/MEDQ  D:  05/09/2008  T:  05/09/2008  Job:  [id number]   cc:   Georgina Quint. Plotnikov, MD

## 2011-01-14 NOTE — Consult Note (Signed)
[facility]                          ENDOCRINOLOGY CONSULTATION   NAME:[name] [name], [name] [name]                     MRN:          [id number]  DATE:04/14/2007                            DOB:          [birth year]    REFERRING PHYSICIAN:  Barbette Hair. Artist Pais, DO   REASON FOR REFERRAL:  Diabetes.   HISTORY OF PRESENT ILLNESS:  A 45 year old man who reports a three year  history of diabetes.  He has no known complications.  He has never been  on insulin except in the hospital.  He currently takes Metformin 1000 mg  daily.  He states his glucoses have never been well controlled, but they  have recently gone above 600.  Symptomatically, he has several days of  polyuria and polydipsia.  He also has some associated leg cramps, but no  numbness of his legs.  He also has slight diarrhea and low back pain.  He describes his diet and exercise as both poor.   PAST MEDICAL HISTORY:  1. Hypertension.  2. Dyslipidemia.  3. IBS.   SOCIAL HISTORY:  He works as a Nutritional therapist.  He is here with his wife.   FAMILY HISTORY:  Positive for diabetes in his mother.   REVIEW OF SYSTEMS:  Denies any change in his weight.  Denies nausea,  vomiting and shortness of breath.   PHYSICAL EXAMINATION:  VITAL SIGNS:  Blood pressure 128/77, heart rate  74, temperature 97.9, weight 244 .  GENERAL:  No distress.  SKIN:  No rash.  Not diaphoretic.  HEENT:  Eyes:  Minimal bilateral proptosis.  Pharynx is normal.  NECK:  No goiter.  CHEST:  Clear to auscultation.  No respiratory distress.  CARDIOVASCULAR:  Trace bilateral pretibial edema.  Regular rate and  rhythm.  No murmur.  Pedal pulses are intact.  No bruit at the carotid  arteries.  EXTREMITIES:  Feet: Normal color and temperature.  There is no ulcer  present on the feet.  NEUROLOGICAL:  Alert and oriented.  Did not appear anxious or depressed.  No tremor.   LABORATORY DATA:  TSH was normal in December 2007.   IMPRESSION:  1. Type 2  diabetes with severe hyperglycemia.  2. Mild proptosis.  3. Muscle cramps, possibly due to diabetes.  4. Diarrhea which could be due to his Metformin.  5. Edema precludes TZD therapy.   PLAN:  1 . We discussed the risks of diabetes as well as the importance  of diet and exercise therapy.  1. Lantus 20 units, a single dose is given today.  2. He should have an annual TSH.  3. For now, he is advised not to work.  4. Return here tomorrow, and we will try to get him set up for insulin      education as no educator is available today.     Sean A. Everardo All, MD  Electronically Signed    SAE/MedQ  DD: 04/18/2007  DT: 04/19/2007  Job #: [id number]   cc:   Georgina Quint. Plotnikov, MD

## 2011-01-14 NOTE — Cardiovascular Report (Signed)
NAME:  [name] [name], [name] [name]              ACCOUNT NO.:  [id number]   MEDICAL RECORD NO.:  [id number]          PATIENT TYPE:  OBV   LOCATION:  4703                         FACILITY:  [facility]   PHYSICIAN:  Arturo Morton. Riley Kill, MD, FACCDATE OF BIRTH:  1966/05/06   DATE OF PROCEDURE:  05/11/2008  DATE OF DISCHARGE:  05/11/2008                            CARDIAC CATHETERIZATION   PROCEDURE NOTE:  After informed and written consent was obtained, the  patient was exercised using a standard Bruce protocol.  Resting blood  pressure 141/63, resting heart rate 77 beats per minute, both heart rate  and blood pressure rose according to exercise regimen with a maximum  heart rate of 136 beats per minute, 76% of maximum predicted with METs  of 9.3, blood pressure 155/51.  The patient expressed no chest pain and  no ST-T wave changes noted during exercise portion.  The patient was  only able to complete 7.37 seconds of stage 2 without complaints.  Before complaining of lower back pain and leg pain, unable to complete  the test.  The treadmill was stopped and the test was aborted secondary  to the patient's complaints of pain in the back and in the legs.  The  patient recovered well without evidence of PVCs or ST-T wave changes.  The patient will be returned to the room with further recommendations  per Dr. Riley Kill.      Bettey Mare. Lyman Bishop, NP      Arturo Morton. Riley Kill, MD, [facility]  Electronically Signed    KML/MEDQ  D:  05/11/2008  T:  05/11/2008  Job:  [id number]

## 2011-01-17 NOTE — Assessment & Plan Note (Signed)
FOLLOW UP:  [name] [name] is back regarding his low back pain.  We set him up with  interferential stimulator per [facility] at last visit and he feels that  this has been helpful from the standpoint of his back range of motion  strength and pain.  His pain is down to a 5 to 6 out of 10, as opposed to 7  out of 10 last month.  He is doing a little bit better with the OxyContin 80  mg q.8h.  The pain still remains in the low back and iliac crest region and  is worse with bending primarily. The pain is better with therapy,  medication, and rest.  Mood is generally stable.  He is still awaiting word  on another job.   REVIEW OF SYMPTOMS:  The patient notes occasional coughing.  Denies any  chest pain, shortness of breath, or heart symptoms.  No weakness, numbness,  dizziness, or spasm.  He has occasional anxiety, depression, and problems  with sleep.  He has reported excess sweating.  He denies any weight gain.  He has lost 80 pounds since we last met.  He denies nausea, vomiting,  reflux, constipation.   PHYSICAL EXAMINATION:  VITAL SIGNS:  Blood pressure 151/90, pulse 95.  He is  saturating 97% on room air.  GENERAL:  The patient walks with normal gait and his affect is brighter.  He  seems overall more comfortable than he had been prior.  MUSCULOSKELETAL:  Low back remains tender to palpation with 45 degrees of  forward flexion noted.  Extension was to neutral with some discomfort today.  Thoracic spine is unchanged.  Motor and sensory in the lower extremities is  within normal limits.   ASSESSMENT:  1. Ongoing low back pain, multifactorial in origin.  2. Obesity.  3. Questionable myofascial pain.  4. Opioid dependency.   PLAN:  The patient has done well with the interferential stimulator at this  point.  Continue with that as well as his home exercise program which has  proved helpful.  We will continue his OxyContin at 80 mg q.8h. (#90) and  Roxicodone 15 mg q.4-6h. p.r.n. (#180).  I  gave the patient information on  implantable spinal stimulators and he will review this information before  coming back to see me in six weeks time.      [name], M.D.   ZTS/MedQ  D:  12/05/2003 16:42:57  T:  12/05/2003 23:06:31  Job #:  [id number]   cc:   [name] [name], M.D.  [address]  [place]  [place] [address]  Fax: [phone]

## 2011-01-17 NOTE — Op Note (Signed)
NAME:  [name] [name], [name] [name]                        ACCOUNT NO.:  [id number]   MEDICAL RECORD NO.:  [id number]                   PATIENT TYPE:  REC   LOCATION:                                       FACILITY:   PHYSICIAN:  Celene Kras, MD                     DATE OF BIRTH:  1965-11-23   DATE OF PROCEDURE:  07/04/2003  DATE OF DISCHARGE:                                 OPERATIVE REPORT   [name] [name] comes in for pain management today. He is a referral from Dr.  Hermelinda Medicus.  He is a 45 year old who has a longstanding history of low back  pain, treated both conservatively and interventionally, with modest  improvement over the years.  He has been on fairly high doses of narcotic  based pain medication, originally initiated by Dr. Lew Dawes, and has had some  type of catheter placement in the epidural space for thoracic and lumbar  pain.  He comes to [place] with a fairly high pain level today, 8/10 subjective  scale, dull, achy and throbbing.  There is no temporal relation today.  It  is paralumbar and perithoracic and he describes a radicular component,  consistent with T6 through 8 as well as paralumbar pain aggravated by  extension.  I reviewed available imaging, review progress today and  available notes.  He has had a good primary care follow-up and is under the  watchful eye of Talmadge Coventry, M.D.  L3-4, 4-5 and 5-1 revealed  degenerative components of diskogenic level as well as facetal overlay.  He  has no bowel or bladder dysfunction, or overall neurological deficit.  He  has difficulty with endurance but continues to work.  His wife is out of  work and he is the breadwinner.   MEDICATIONS:  1. Zoloft.  2. OxyContin.  3. Avinza.  4. Oxycodone.  5. Xanax.  6. Soma.  7. Zanaflex.  8. __________.  9. Lotrel.   Report states no wish to harm self or others.  14-point review of systems  health and history form reviewed.  He is allergic to anti-inflammatory  medicine which upset  his stomach.   PAST MEDICAL HISTORY:  Remarkable for hypertension.   PAST SURGICAL HISTORY:  Otherwise noncontributory to pain problem.   FAMILY HISTORY:  Noncontributory to the pain problem.   SOCIAL HISTORY:  Smoker with 40 pack year history.  I cautioned, he is  married and continues to work.   Review of systems, family and social history otherwise noncontributory to  pain problem.   PHYSICAL EXAMINATION:  GENERAL:  Alert and pleasant male sitting comfortably  in bed.  Gait was somewhat antalgic presentation.  Affect appearing normal  and oriented times three.  HEENT:  Unremarkable.  CHEST:  Clear to auscultation and percussion.  He has regular rate and  rhythm without rub, murmur or gallop.  ABDOMEN:  Soft, nontender, benign.  Moderately obese.  No  hepatosplenomegaly.   Diffuse paralumbar myofascial discomfort, notable pain on extension with  pain over PSIS.  Gaenslen's and Patrick's not tested.  Intact  neurologically.  Motor and sensory reflexic.   IMPRESSION:  Degenerative spine disease, lumbar spine.  Degenerative spine  disease, thoracic spine.  Facet syndrome.   PLAN:  1. Consider thoracic epidural, but he had a catheter placed to the thoracic     epidural spine by Dr. Lew Dawes, apparently under fluoroscopy, injected and     stated he had modest relief.  His pain is primarily paralumbar.  I think     it is reasonable to go ahead and mix approaches, but I will focus on the     lumbar component as this is most problematic for him.  This is his     limiting pain with endurance, restorative sleep capacity and difficulty     with ambulation.  2. I will go ahead and inject the facet of L5-S1, L4-5, L3-4 with     contributory innervation addressed, 2, 3 right and left-sided independent     needle access points.  We will assess this in the context of activities     of daily living, positive provocative block raised consideration of     radiofrequency neuroablation.  3.  Should he do well with this, we will go ahead and then address this     thoracic epidural space.  With this mixed approach of interventional     procedures, we will probably be able to increase our localized steroid     dosing.  4. Lifestyle enhancements discussed such as weight control, he is to see     primary care physician, as well as cigarette cessation.  Therapeutic     consideration through Dr. Earna Coder.   He is consented for today's procedure.   IMPRESSION:  Degenerative spine disease lumbar spine, facet syndrome,  degenerative spine disease, thoracic spine.   PLAN:  Facet injection, L5-S1, L4-5, L3-4 contributory innervation, right  and left side.  He has consented.   DESCRIPTION OF PROCEDURE:  The patient was taken to the fluoroscopy suite  and placed in prone position.  Back prepped and draped in usual fashion.  Using a 22 gauge spinal needle, I advanced to the facet, medial branch of L5-  S1, L4-5, L3-4, and 2-3 with contributory innervation addressed, right and  left side at independent needle access point.  I confirmed placement and  multiple fluoroscopic positions, then injected 1mL lidocaine, 1% NPF at each  level, a total of 40 mg of Aristocort in divided dose.  The patient  tolerated the procedure well.  There were no complications from our  procedure.  Discharge instructions given.  Appropriate recovery.  Will see  him in follow-up.  Improved at discharge.                                               Celene Kras, MD    HH/MEDQ  D:  07/04/2003  T:  07/04/2003  Job:  [id number]

## 2011-01-17 NOTE — Assessment & Plan Note (Signed)
[facility]                           GASTROENTEROLOGY OFFICE NOTE   NAME:[name] [name]                       MRN:          [id number]  DATE:07/15/2006                            DOB:          [birth year]    PRIMARY CARE PHYSICIAN:  Dr. Macarthur Critchley Plotnikov.   GASTROINTESTINAL PROBLEM LIST:  Generalized abdominal cramping for 2 to 3  months. Moving his bowels does seem to help sometimes. October 2007 normal  CBC. October 2007 complete metabolic profile normal except for ALT of 64.  Found to be Helicobacter pylori antibody positive, eradicated with  antibiotics. Right upper quadrant ultrasound September 2007:  Normal  gallbladder, normal bile duct. CT scan with IV and oral contrast November  2007:  Essentially normal. EGD November 2007:  Mild nonspecific gastritis,  CLOtest negative, otherwise normal.   INTERVAL HISTORY:  I last saw Marq a week ago at the time of his EGD. Since  then, he was started on Levbid and a PPI. He does seem to be somewhat better  but is still complaining of pains that are nearly constant but do sort of  come and go throughout the day, periumbilical, right upper quadrant, as well  as across his epigastrium. The pains do sometimes seem to get better when he  moves his bowels. Of important note, he is still on 400 of OxyContin daily.   CURRENT MEDICATIONS:  1. Lotrel.  2. Toprol.  3. Cymbalta.  4. Metformin.  5. OxyContin 400 mg daily.  6. Calcium.  7. Vitamin C.  8. Baby aspirin.  9. Co-enzyme Q12.  10.Vitamin D.  11.Prevacid.  12.Levbid twice daily.   PHYSICAL EXAMINATION:  Weight 262 pounds, blood pressure 136/64, pulse 64.  Obese, otherwise generally well appearing.  ABDOMEN:  Soft, mildly tender throughout, nondistended, normal bowel sounds.   ASSESSMENT AND PLAN:  A 45 year old man with abdominal cramping of unclear  etiology. He has had exhaustive workup, essentially negative except for  singly mildly  elevated ALT of 64.   It is not clear what has been causing his abdominal discomfort. He may have  slow gastric motility from a combination of diabetic influences plus very  high dose daily narcotic usage. I am hesitant to do a gastric emptying scan  as I would not know how to interpret it in light of the 400 of OxyContin he  takes daily. He has had some mild constipation recently since starting the  Levbid, and with his ongoing periumbilical discomfort, I think it reasonable  to proceed with a full colonoscopy as his soonest convenience. We will  therefore arrange for that to be done.     Rachael Fee, MD  Electronically Signed    DPJ/MedQ  DD: 07/15/2006  DT: 07/15/2006  Job #: [id number]   cc:   Georgina Quint. Plotnikov, MD

## 2011-01-17 NOTE — Op Note (Signed)
NAME:  [name], [name] NO.:  [id number]   MEDICAL RECORD NO.:  [id number]                   PATIENT TYPE:  REC   LOCATION:                                       FACILITY:  [facility]   PHYSICIAN:  Celene Kras, MD                     DATE OF BIRTH:  11-07-1965   DATE OF PROCEDURE:  DATE OF DISCHARGE:                                 OPERATIVE REPORT   Today I evaluated him, and reviewed the health and history form and 14-point  review of systems.   1. [name] [name] was originally scheduled for radiofrequency neuroablation today but     I just cannot clearly identify that we have made some progress forward     with medial branch injection.  He had a brief result, but I just do not     think that he is going to be a very positive responder to RF at this     time.   1. As I reviewed the MRI with him, I think his symptoms are problematic from     a discogenic component as well.  This would be more the anterior     compartment in a generalized fashion, probably from the L5-S1 component.     He does have discogenic disease at this level.  It seems to be     problematic in presentation.  It is reasonable to go ahead and at least     trial a singular epidural to see how he does as it compares against the     medial branch injection, as we want to improve his function and quality     of life.  He is clearly in pain.   1. I think if we can say anything about the medial branch injection that we     saw some progress with him decreasing his medication usage.  He has only     taken four Avinza this month.  I think that is a very positive movement     forward with him, as another rationale for performing these injections as     to minimize escalation of narcotic-based pain medication.    OBJECTIVE:  Diffuse paralumbar myofascial discomfort, pain over PSIS and  pain with extension.  Straight leg lift unimpaired.  He has no significant  change in neurological or musculoskeletal  presentation.   IMPRESSION:  Facet syndrome, degeneration spine disease of the lumbar spine.   PLAN:  Lumbar epidural.  He has consented.   PROCEDURE:  The patient was taken to the fluoroscopy suite and placed in the  prone position, prepped and draped in the usual fashion.  Using a Hustead  needle at the L5-S1 interspace with no evidence of CSF, heme, or  paresthesia, test block uneventfully, followed by 40 mg of Aristocort and  flushing the needle.   He tolerated this procedure well, no complications  during the procedure.  Discharge instructions given.  Appropriate recovery.   Again, cigarette cessation for best outcome, and I will have him follow our  physiatry colleagues for continuation of physical therapy.  Lifestyle  enhancements include weight control, and I asked to maintain contact with  primary care.  Will see him in followup.  Extensive consultation.  Discharge  instructions given.  Will assess in respect to contacts and activities of  daily living and determine further course of care based on functional  enhancements.                                              Celene Kras, MD   HH/MEDQ  D:  08/01/2003  T:  08/01/2003  Job:  [id number]

## 2011-01-17 NOTE — Assessment & Plan Note (Signed)
MEDICAL RECORD NUMBER:  [id number]   [name] [name] is back regarding his chronic low back pain. He has been out of work  since October when he was laid off his job. The lay-off was not due to his  physical restraints but in fact due to the amount of work the company had  available. He is looking at getting back to work some time in the next month  or so. His back pain has been better since he has been out of work. His knee  has improved as well. He continues on OxyContin 80 mg q.6h. We had tried to  switch him over to Avinza 120 mg q.12h. and he had no benefit with this and,  in fact, his pain was worse. The patient rates his pain right now at 6-7/10.  Describes it as sharp, burning, dull and aching. The pain involves the low  back as well as the intrascapular regions. The pain increases with bending,  sitting, and standing, as well as general activity.   SOCIAL HISTORY:  Pertinent positives listed above. He has reduced his  cigarette smoking to one pack per day.   REVIEW OF SYSTEMS:  The patient denies any new neurological or psychiatric  issues. His mood has been fair. He has had some weight gain since being out  of work. He was admitted for pneumonia in the fall. He has an ongoing upper  respiratory infection at this moment as well.   PHYSICAL EXAMINATION:  Blood pressure 158/90, pulse 72, respiratory rate 16,  saturating 100% in room air. The patient is pleasant, in no acute distress.  He is alert and oriented x3. Affect is bright and appropriate. Gait is  stable. His posture is, in fact, better and more erect. He still has pain  with flexion to a certain extent when he bends past 60 degrees. Extension  causes the most pain for him today. Moderate pain is noted with side-bending  and rotation. Heart is regular rate and rhythm, lungs are clear, abdomen  soft and nontender. Neurologically, muscle function is 5/5 except with pain  inhibition. Sensation is intact. Cognitively, the patient is  appropriate.   ASSESSMENT:  1.  Chronic low back pain due to spondylosis of the lumbar and cervical      spine.  2.  Abdominal obesity.  3.  Osteoarthritis of the knees.  4.  Right rotator cuff tendonitis.   PLAN:  1.  Continue OxyContin CR 80 mg q.6h. We discussed a potential switch to      [facility]. He will check with his insurance regarding coverage.  2.  I will see the patient back in about 3 months' time.      [name], M.D.  Electronically Signed     ZTS/MedQ  D:  09/19/2005 11:48:13  T:  09/19/2005 13:29:14  Job #:  [id number]   cc:   Talmadge Coventry, M.D.  Fax: [phone]

## 2011-01-17 NOTE — Assessment & Plan Note (Signed)
MEDICAL RECORD NUMBER:  [id number]   HISTORY:  [name] [name] is back regarding his low back pain.  He has continued to  have pain in the low back and into the legs on occasion as well as in the  upper back radiating around to the rib cage.  He feels that his Roxicodone  is not covering breakthrough pain at this point.  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  He is  reporting frequent cramping during the day particularly in his hamstring  muscles as well as at nighttime which it seems to be the worst.  Pain ranges  from a 6-9/10.  He tells me that his OxyContin seems to hold him for about 4  hours before its drug effects start to wane.  The Roxicodone really is  having no effect; he is taking five or six a day.   REVIEW OF SYSTEMS:  Patient denies any chest pain, coughing, or heart  murmur.  He does report occasional shortness of breath and coughing.  He is  having leg cramps as described above.  He denies any weakness, stroke,  vertigo, confusion, depression, or suicidal thoughts.  He denies nausea  vomiting, reflux, constipation, diarrhea, or abdominal pain.  Patient does  report some weight gain and denies swelling, skin breakdown, or problems  with sweating.  He does feel that his posture may be a little bit better.  He had lost 80 pounds prior to our last visit but seems to have gained some  of those back again.   PHYSICAL EXAMINATION:  Today blood pressure is 159/89, pulse is 98,  respiratory rate is 14, he is saturating 98% on room air.  The patient walks  with a limp and a flexed posture although he is a little straighter in the  lumbar spine but he still remains quite flexed and crouched in the thoracic  area.  He is generally alert and appropriate today.  It does appear that he  has gotten some sun.  Motor strength in the lower extremities is really 5/5  throughout.  He had some spasms on moving from a seated to a standing  position today in the left hamstring.  I did passively range him in all  joints in the lower extremities without significant difficulties.  He had  occasional catches of the left knee with some crepitus and some positive  meniscal signs.  Sensory exam was normal lower extremities and reflexes were  essentially 1+ to 2+.  Patient remains significantly obese.  Lumbar spine  revealed some spasms in the paraspinal musculature, no obvious abnormalities  except for the hyperflexion noted.   ASSESSMENT:  1. Ongoing low back pain which remains multifactorial with discogenic     arthritic and postural components.  2. Obesity.  3. Myofascial pain.  4. Opioid dependency.   PLAN:  1. The patient did well with the interferential stimulator previously.  I     would like to see if he can appeal the denial from [employer].  I     asked him to contact [facility] regarding information to help him along     those lines.  2. We will discontinue his Roxicodone and get him on q.6h. OxyContin 80 mg     (#120).  This may give him better round-the-clock relief.  We will     consider a transition to Avinza at some point.  3. Encouraged regular stretching  during work and after work as well as     postural reminders.  He is to make sure that he is drinking adequate     fluid and taking in adequate electrolytes during the day to help prevent     muscle     spasms.  The patient voiced understanding here.  4. I will see the patient back in about 1 month's time.      [name], M.D.   ZTS/MedQ  D:  04/02/2004 16:09:29  T:  04/03/2004 07:03:57  Job #:  [id number]   cc:   Talmadge Coventry, M.D.  [address]  [place]  [place] [address]  Fax: [phone]

## 2011-01-17 NOTE — Assessment & Plan Note (Signed)
MEDICAL RECORD NUMBER:  [id number].   [name] [name] is back here as work in as he states he misplaced half of his  OxyContin. His wife and he have not been able to find that bottle. He was  given two different bottles by pharmacy as they did not have the complete  prescription on the first visit. His wife was out of town in the interim.  The patient states pain has spiked since being off the medication. He has  taken a couple of Deborah's OxyContin to help get him through. Pain  previously had been a 6/10, and now it is a 9/10 today. Sleep is poor. Mood  is depressed at times. He had to come out of work due to increased pain.   On review of systems, the patient reports occasional numbness and tingling.  He has shortness of breath with exertion. Other review of systems items are  in the health and history section of the chart.   SOCIAL HISTORY:  The patient continues to live with his wife and has  children.   PHYSICAL EXAMINATION:  Blood pressure 159/91, pulse 95, respiratory rate 16.  He is saturating 98% on room air. The patient is pleasant but in mild  distress. He is alert and oriented x3. Affect is alert and anxious somewhat.  Gait is stable. He continues to walk with a flexed lumbar posture. He had  tenderness in the right shoulder today. Low back range of motion is stable  to decreased. He had decreased extension and flexion overall today.  Myofascial tenderness was noted. Knees remained painful.   ASSESSMENT:  1.  Chronic low back pain due to diskogenic and facet factors of the lumbar      spine predominately.  2.  Moderately obesity.  3.  Osteoarthritis of the knees.  4.  Right rotator cuff tendinitis.  5.  Opioid dependency.   PLAN:  1.  Will continue with his OxyContin 80 mg q.i.d. We had a long talk today      about options other than OxyContin. He asked for breakthrough      medication, yet he is taking the OxyContin every six hours. Our ultimate      goal previously in  changing to OxyContin q.i.d. was to decrease the      amount of pills he was taking a day before this. I think he would be      much better on Avinza. They have a new drug assistance program      available. We would need to go through a conversion period of a month or      two prior to switching to Avinza alone. He will also consider methadone      and a Duragesic patch. He states he has had problems with the Duragesic      patch due to adherence and sweating previously.  2.  I am not sure if he can get his refill for his OxyContin early, but a      prescription was written nonetheless. He      had a long discussion overall about the pharmacokinetics of the      OxyContin risks, side effects, etc.  3.  We will see Taten back in about three months' time.      [name], M.D.  Electronically Signed     ZTS/MedQ  D:  04/22/2005 13:57:17  T:  04/22/2005 16:05:01  Job #:  [id number]   cc:   Talmadge Coventry, M.D.  [phone]  [address]  [place]  [place] [address]  Fax: [phone]

## 2011-01-17 NOTE — Assessment & Plan Note (Signed)
[facility]                           GASTROENTEROLOGY OFFICE NOTE   NAME:[name], [name] [name]                     MRN:          [id number]  DATE:07/27/2006                            DOB:          [birth year]    PRIMARY CARE PHYSICIAN:  Georgina Quint. Plotnikov, M.D.   GI PROBLEM LIST:  If you could please incorporate the exact GI problem list  from the note dated July 15, 2006 with only a couple of changes.  Colonoscopy November 2007 normal.   INTERVAL HISTORY:  I last saw [name] [name] two weeks ago at the time of his  colonoscopy.  Since then, he has continued to have the same abdominal  comforts.  A colleague that covered our GI service one night this past week,  Dr. Jordan Hawks. Elnoria Howard, related to me that [name] [name] called in requesting narcotic  pain medicines.  Dr. Elnoria Howard filled a prescription for him for I believe 10 to  20 short-acting narcotic pain medicines.  I queried Marlos about whether he  was taking any more narcotics than his OxyContin 400 mg a day and he denied  taking anything other than that.  I did not confront him on the conflicting  information by Dr. Elnoria Howard but I am concerned that he may have some drug  seeking behavior.   CURRENT MEDICATIONS:  Lotrel, Toprol, Cymbalta, Metformin, OxyContin 400 mg  daily, Calcium, vitamin C, baby aspirin, Co-enzyme, vitamin B, Prevacid.   Weight 252 pounds which is down 10 pounds since his last visit.  Temperature  98.7, blood pressure 122/82, pulse 84.  CONSTITUTIONAL:  Generally well-appearing although obese.  ABDOMEN:  Soft, nontender, and nondistended.  Normal bowel sounds.   ASSESSMENT/PLAN:  A 45 year old man with abdominal discomfort of unclear  etiology, extensively worked up.   [name] [name] has had a very thorough workup.  He may have slow gastric  motility from a combination of diabetes plus a very high dose daily narcotic  usage.  I am also concerned that he may have some drug seeking behavior, see  the above interval history section.  I will not be prescribing him narcotic  pain medicines for his discomfort.  He does have rather chronic nausea and I  recommended that he take Compazine once or twice a day and Reglan once or  twice a day.  He likely does have slow gastric motility from the chronic  pain medicines.  Slow gastric emptying can also cause abdominal discomfort.  He will follow up with me as needed.  I urged him to definitely get in touch  if he has any dramatic changes in his symptoms.    Rachael Fee, MD  Electronically Signed   DPJ/MedQ  DD: 07/27/2006  DT: 07/27/2006  Job #: [id number]   cc:   Georgina Quint. Plotnikov, MD

## 2011-01-17 NOTE — Op Note (Signed)
[facility]  Patient:    [name] [name], [name] [name]                       MRN: [id number] Proc. Date: 09/24/00 Adm. Date:  [id number] Disc. Date: [id number] Attending:  Doug Sou                           Operative Report  PROCEDURE:  Cystoscopy, right retrograde pyelogram with interpretation.  PREOPERATIVE DIAGNOSIS:  Right flank pain with hematuria.  POSTOPERATIVE DIAGNOSES:  Right flank pain with hematuria.  SURGEON:  Dr. Annabell Howells.  ANESTHESIA:  General.  COMPLICATIONS:  None.  INDICATIONS FOR PROCEDURE:  Mr. Alvillar is a 45 year old white male with a history of stones who has had chronic right flank pain and microhematuria. A CT scan initially showed a 1 mm right upper ureteral stone. He had persistent symptoms. A CT scan on December 18 revealed the stone is no longer present. I had done an office IVP and was unable to detect a stone but the patient did have persistent hematuria and it was felt that cystoscopy and retrograde pyelogram was worthwhile to further evaluate for the pain and hematuria.  FINDINGS AND DESCRIPTION OF PROCEDURE:  The patient was taken to the operating room where a general anesthetic was induced. He had been given p.o. Tequin preoperatively, he was placed in lithotomy position. His perineum and genitalia were prepped with Betadine solution. He was draped in the usual sterile fashion. Cystoscopy was performed using the 22 [place] scope and the 12 and 70 degrees lenses. Examination revealed a normal urethra. The external sphincter was intact. The prostatic urethra was short without obstruction. The bladder wall was smooth and pale without tumor, stones, or inflammation. The ureteral orifices were in the normal anatomic position effluxing clear urine. After completion of the cystoscopy, a 5 [place] open-ended catheter was inserted in the right ureteral orifice, contrast was instilled in a retrograde fashion. This demonstrated an  entirely normal ureter without filling defects apart from some air bubbles and a contrast. The internal collecting system was delicate without hydronephrosis or a filling defect. After completion of the retrograde and removal of the catheter, the system drained readily with no evidence of obstruction. At this point, the patients bladder was drained, the cystoscope was removed. He was taken down from lithotomy position and moved to the recovery room in stable condition. There were no complications during the procedure. DD:  09/24/00 TD:  09/24/00 Job: [id number]

## 2011-01-17 NOTE — Assessment & Plan Note (Signed)
MEDICAL RECORD NUMBER:  [id number].   [name] [name] is back regarding his chronic low back and neck pain.  We discussed  his pain today and it remains at 8/10, described as sharp, stabbing and  aching.  It is worse with activity generally.  He is working full time as a  Nutritional therapist.  It interferes with general  activity, relationship with others and  enjoyment of life on a moderate level.  He can walk about 15 to 30 minutes  without stopping.  He is trying to do some walking at home.  He is still  having some problems with weight.  He is not eating well.  He was recently  diagnosed with type 2 diabetes.  I gave him a voucher for the Avinza which  he has not investigated as of yet.  He has about 10 days left on his current  OxyContin prescription.  He is taking 80 mg q.6h.   REVIEW OF SYSTEMS:  No new neurological, psychiatric, constitutional, GU, GI  or cardiorespiratory problems are noted today.   SOCIAL HISTORY:  Pertinent positives listed above.  He is married.  He still  smokes two packs of cigarettes a day.   PHYSICAL EXAMINATION:  GENERAL APPEARANCE:  Patient is pleasant, in no acute  distress.  His affect is a bit flat.  VITAL SIGNS:  Blood pressure 159/82, pulse 81, respiratory rate 18, he is  sating 98% on room air.  LUNGS:  Clear.  CARDIOVASCULAR:  Regular rate and rhythm.  ABDOMEN:  He remains significantly obese, particularly in the abdomen.  MUSCULOSKELETAL:  He is tender along the lumbar facets as well as cervical  facets and associated paraspinal muscles.  He is limited with active  extension today as well as rotation in the lumbar spine.  He has fair  cervical movement today.  NEUROLOGIC:  Gait is slightly shuffling.  He walks with a flexed posture.  He stands a bit more erect that he has in the past.  He is alert and  oriented x3.  Reflexes are 2+.  Motor function is 5/5.   ASSESSMENT:  1.  Chronic low back pain due to spondylosis of lumbar and cervical spine.  2.  Moderate  abdominal obesity.  3.  Osteoarthritis of the knees.  4.  Right rotator cuff tendinitis.  5.  Opioid dependency.   PLAN:  1.  Will try to make the switch over to Avinza beginning over the next week      or so.  I believe he can afford this through the medication assist      program.  I think he will get better overall pain relief as long as we      can get over the expectation of six-hour fix.  Will begin him on      Avinza 120 mg q.12h.  I gave him Roxicodone 15 mg q.12h. p.r.n. for      breakthrough symptoms.  He may use these a bit more aggressively at      first until he adjusts to the Avinza.  OxyContin will be going back to      brand name only in January so this should not make a financial      difference for him either.  2.  He continues to need to work on exercise and diet, especially in light      of his newly diagnosed diabetes.  3.  I will see the patient back in one month's time.  [name], M.D.  Electronically Signed     ZTS/MedQ  D:  05/14/2005 10:08:34  T:  05/14/2005 11:02:59  Job #:  [id number]   cc:   Talmadge Coventry, M.D.  [address]  [place]  [place] [address]  Fax: [phone]

## 2011-01-17 NOTE — Assessment & Plan Note (Signed)
[name] [name] is back regarding his continued low back pain.  He had some problems  with depression and essentially a mental breakdown and required some short-  term hospitalization at the end of January.  The patient also came off the  Avinza late last year really on his own.  He maintains on OxyContin 80 mg  q.12h. essentially.  He is using four to five oxycodone 50 mg for  breakthrough pain.  Pain still remains significant at a 7/10 on average.  Pain mainly resides in the low back region with some minimal radiation down  the legs.  He felt that he had minimal results with the injections from Dr.  Stevphen Rochester.   [name] [name] has interest in decreasing his medications.  He is attempting to go  back to work at a more sedentary position.  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   REVIEW OF SYMPTOMS:  The patient notes some occasional coughing, shortness  of breath, anxiety and depression.  No recent nausea, vomiting, reflux or  constipation.  No weakness, dizziness, spasms, confusion, nor weight loss or  gain.   PHYSICAL EXAMINATION:  VITAL SIGNS:  Blood pressure is 167/92, pulse 112,  respiratory rate 18, saturating 96% on room air.   The patient's posture is similar with resting flexed posture about 20  degrees at the pelvis and lumbar region.  He appears more bright and alert  that he has on prior visits.  He has pain in multiple facet areas  particularly in the L4-S1 regions.  He has fair flexion at approximately 40  degrees unassisted with minimal back extension.  He has a hard time moving  through neutral spine position today.  Thoracic spine is without change.   ASSESSMENT:  1. Ongoing low back pain which is multifactorial failing multiple other     interventions.  2. Obesity.  3. Questionable myofascial pain.  4. Opioid dependency.   PLAN:  1. We will begin a slow wean of his narcotic medications.  I will switch him     over to  Kadian which has a better half life and distribution than     OxyContin.  We will set him up with 100 mg of Kadian q.12h.  2. Will maintain his same oxycodone dose at 15 mg q.4-6h. p.r.n.  3. We discussed the possibility of a spinal stimulator and there may be a     gentleman in [place], [place], who could help him for some     of his centralized low back pain with new stimulator that is out on the     market.  I will get back with the patient in this regard.  4. Will attempt to set the patient up with interferential stimulator per [facility] to see if we can decrease some of his localized low back     discomfort.  5. If we can get his pain reduced, might initiate exercise with stretching     program; but at this point that is unrealistic to pursue.  6. I will see the patient back in approximately one month's time.  I did     recommend psychological follow-up and he     seems to be pursuing this through Dr. Stephannie Peters office.  Dr. Smith Mince     will manage his psychiatric medications.      [name], M.D.   ZTS/MedQ  D:  11/01/2003 10:08:57  T:  11/01/2003 11:46:33  Job #:  [id number]   cc:   Talmadge Coventry, M.D.  [address]  [place]  [place] [address]  Fax: [phone]

## 2011-01-17 NOTE — Assessment & Plan Note (Signed)
[facility]                           GASTROENTEROLOGY OFFICE NOTE   NAME:[name], [name] GRATZ                     MRN:          [id number]  DATE:07/06/2006                            DOB:          1966-03-29    REFERRING PHYSICIAN:  Georgina Quint. Plotnikov, MD   Dr. Posey Rea asked me to evaluate [name] [name] regarding abdominal pain.   HISTORY OF PRESENT ILLNESS:  [name] [name] is a pleasant 45 year old man who  has had a nearly constant right upper and right lower quadrant abdominal  pains for the past 2 to 3 months.  He describes the pain as cramping.  It is  not related to moving his bowels or eating.  It seems to be there pretty  much all day everyday but does wax and wane a bit.  He has been nauseous  with this and feels that sometimes he has even been feverish.   He has had recent imaging tests and lab studies done.  Most recently he had  a CBC in October 2007 that showed a normal CBC.  His complete metabolic  profile was normal except for a single elevation of his ALT of 64, found to  be H. pylori antibody positive.  He was treated for his H. pylori exposure.  His symptoms did not seem to improve and so he is back on medicines to  perhaps treat H. pylori including Flagyl, Creon and acidophilus.  He also  had a right upper quadrant ultrasound done May 27, 2006 that showed an  essentially normal gallbladder, normal bile duct.  This did not get a good  look at his pancreas.  He has severe increased echogenicity of the liver and  enlargement of his liver, question of underlying fatty liver disease.  There  was also question of fibrotic appearance of his liver.   REVIEW OF SYSTEMS:  Is noted for fluctuating weight up and down 20 pounds  for the past year or two.  The rest of his review of systems is essentially  normal and is available on his nursing intake sheet.   PAST MEDICAL HISTORY:  1. Low back pain, undergone 18 to 20 epidural shots.   Currently on high      dose chronic narcotics, OxyContin 400 mg daily.  2. Hypertension.  3. Diabetes.  4. Status post knee surgery.  5. Depression.  6. Kidney stones.  7. Vasectomy in 1996.   CURRENT MEDICINES:  1. OxyContin 100 mg q.i.d.  2. Acidophilus.  3. Creon.  4. Flagyl t.i.d.  5. Metformin 500 twice daily.  6. Cymbalta.  7. Toprol.  8. Lotrel.  9. Calcium.  10.Vitamin C.  11.Baby aspirin.  12.Coenzyme Q10.  13.Vitamin D.   ALLERGIES:  NSAIDs cause stomach discomfort.   SOCIAL HISTORY:  Married with 2 sons, smokes 2 packs a day, drinks alcohol  occasionally.   FAMILY HISTORY:  Mother, uncles with diabetes.  Grandfather with colon  cancer. No first degree relatives with colon cancer, colon polyps.   PHYSICAL EXAMINATION:  He is 251 pounds.  Blood pressure 110/76, pulse 80.  CONSTITUTIONAL:  Generally well-appearing.  NEUROLOGIC:  Alert and oriented x3.  EYES:  Extraocular movements intact.  MOUTH:  Oropharynx moist, no lesions.  NECK:  Supple, no lymphadenopathy.  CARDIOVASCULAR:  Heart regular rate and rhythm.  LUNGS:  Clear to auscultation bilaterally.  ABDOMEN:  Soft, mildly tender in the right upper and lower quadrants.  Nondistended, no obvious ascites.  EXTREMITIES:  No lower extremity edema.  SKIN:  No rashes or lesions on visible extremities.   ASSESSMENT/PLAN:  A 45 year old man with waxing and waning right sided  abdominal pain.  First it is unclear whether the H. pylori antibody  positivity as related to any actual ulcer disease.  I think we should  proceed with EGD at his soonest convenience to check whether he indeed has  peptic ulcer disease.  Also his ultrasound did not get a good look at his  pancreas and did suggest that he possibly has fibrotic liver disease, so I  think getting a second imaging study to look at these questions  specifically, whether he has underlying cirrhosis which I doubt and also get  a good look at his pancreas.  He  has had recent lab tests, I do not think we  need to repeat those.  He is currently on Flagyl, Creon and Acidophilus.  I  am not sure that these are helping him at this point.  He has certainly  noticed no difference in his symptoms and so recommending that he stop those  for now, just to simplify his regimen.  He is at the point to note that he  is taking 400 mg of OxyContin daily and on this high dose of narcotics, it  is often difficult to explain gastrointestinal symptoms, especially nausea,  which he does seem to have a component of.  Surprisingly though, he is not  constipated.   We will therefore get a CT scan of abdomen and pelvis and schedule him for  an EGD at his soonest convenience and he will stop taking Creon, Flagyl and  Acidophilus.    ______________________________  Rachael Fee, MD    DPJ/MedQ  DD: 07/06/2006  DT: 07/07/2006  Job #: [id number]   cc:   Georgina Quint. Plotnikov, MD

## 2011-01-17 NOTE — H&P (Signed)
NAME:  [name] [name], DIMOCK              ACCOUNT NO.:  [id number]   MEDICAL RECORD NO.:  [id number]          PATIENT TYPE:  INP   LOCATION:  6735                         FACILITY:  [facility]   PHYSICIAN:  Jonna L. Robb Matar, M.D.DATE OF BIRTH:  [birth year]   DATE OF ADMISSION:  06/12/2005  DATE OF DISCHARGE:                                HISTORY & PHYSICAL   PRIMARY CARE PHYSICIAN:  Talmadge Coventry, M.D.   CHIEF COMPLAINT:  Short of breath.   HISTORY OF PRESENT ILLNESS:  This is a 45 year old white male who was in the  emergency room on June 02, 2005.  He wanted to try to decrease his  OxyContin that he has been on chronically.  At that time he was feeling  healthy.  By June 09, 2005, he had developed an acute bronchitis.  He was  seen in the office.  A chest x-ray was normal.  He was prescribed Biaxin.  The patient has progressively gotten worse.  He has been having a worsening  nonproductive cough, episodes of diaphoresis, shaking of his legs, not  exactly a chill and more bronchospasm.  He came in to the office again today  and at that time he had a spiral CT of his chest, which did not show any  pulmonary emboli, but did show bilateral ground glass interstitial pattern  considered BOOP or usual interstitial pneumonitis along with more common  forms of pneumonia.   ALLERGIES:  NON-STEROID ANTI-INFLAMMATORY DRUGS cause nausea.   MEDICAL DECISION MAKING:  1.  Glucophage 1000 mg daily.  2.  Lotrel 10/20 mg daily.  3.  Toprol 50 mg daily.  4.  Wellbutrin 150 mg daily.  5.  Effexor 150 mg daily.  6.  Aspirin 81 mg daily.  7.  OxyContin 80 mg q.i.d.   PAST MEDICAL HISTORY:  1.  Hypertension.  He has had this for 10 years.  2.  Type 2 diabetes.  This was diagnosed a couple of months ago.  He does      not check his sugars very often, but his wife said that a few days ago      his fasting sugar was in the 130's.  3.  Chronic lumbar pain, secondary to spondylosis.  He has had  multiple      problems with this and sees Dr. [name] for chronic pain      management.  4.  Osteoarthritis disease.  5.  Right rotator cuff tendinitis.  6.  Hyperlipidemia, but at the moment he is not on any statin drugs.  7.  Frequent diaphoresis.  He has been having this actually worsening over      the past year.  8.  He had meningitis as a child, with no residual.   PAST SURGICAL HISTORY:  1.  Left knee arthroscopy.  2.  Vasectomy.   FAMILY HISTORY:  Colon cancer, diabetes, hypertension, depression.  He has  one brother and two sisters.   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  He smokes two packs per day.  He is a non-drinker.  No drugs.  Married for  17 years.  Two boys ages 87 and 66.   REVIEW OF SYSTEMS:  He complains that his ears have been itching.  No visual  problems.  No hemoptysis.  No sore throat or swollen glands.  No history of  heart disease, but his wife is concerned that these episodes of diaphoresis  might represent some heart problems.  She is concerned that perhaps he  should get a stress test due to his life style and risk factors.  No history  of ulcers, liver problems, gallbladder trouble, diarrhea or constipation.  He does have a history of renal calculi and over the past few months has  developed some urinary hesitancy.  He complains of arthritis problems in his  back.  Other review of systems were reviewed and were negative.   PHYSICAL EXAMINATION:  VITAL SIGNS:  Temperature 98.5 degrees, pulse 93,  respirations 20, blood pressure 154/88.  Weight is 240 pounds.  GENERAL:  He is an overweight white male, in moderate respiratory distress.  HEENT:  Conjunctivae and lids are normal.  Pupils reactive.  Extraocular  movements are full and he has normal ears, mucosa and pharynx.  NECK:  No mass, thyromegaly or carotid bruits.  LUNGS:   Respiratory effort is increased.  Lungs have scattered expiratory  wheezes and can really decrease breath sounds.  HEART:  A regular rate and rhythm.  Normal S1, S2 without murmurs, rubs or  gallops.  Pulses show no bruits.  EXTREMITIES:  No clubbing, cyanosis or edema.  ABDOMEN:  Nontender, slightly distended with normal bowel sounds.  No  hepatosplenomegaly or hernia.  GENITOURINARY:  Normal scrotum and penis.  NODES:  No cervical or inguinal adenopathy.  MUSCULOSKELETAL:  Muscle strength is 5/5.  SKIN:  No rash, lesions, nodules or induration but he is diaphoretic,  although his temperature is not elevated.  NEUROLOGIC:  Cranial nerves intact.  Deep tendon reflexes are 2+.  Sensation  is intact.  The patient has stiffness of movement of his back.  He is alert  and oriented x3.  Normal memory, judgment and affect.   LABORATORY DATA:  White count 18,000, hemoglobin 13.9, 91% neutrophils.  BUN  6, creatinine 0.7, minimally elevated AST and ALT.  Blood gas with pH of  7.44, pCO2 of 39, pO2 of only 54, with saturation of 89%.   IMPRESSION:  1.  Interstitial pneumonia, versus bronchitis obliterans with organized      pneumonia, versus usual interstitial pneumonia or hyper-sensitivity      pneumonitis:  I will put him on Rocephin and Zithromax tonight and get      put him on nebulizers.  Hold off on any further steroids.  I have      consulted for pulmonary to see him in the morning and consider getting a      bronchoscopy.  2.  Type 2 diabetes:  Will evaluate his status.  3.  Hypertension:  I am going to take him off the Benazepril to see if there      is any element of ACE inhibitor reaction that might be involved.  4.  Hyperlipidemia:  This really has not been evaluated, and someone with      his risk factors probably should be on statin drugs if his low-density      lipoprotein is over 100.  5.  Chronic  pain syndrome:  I will continue his OxyContin. 6.  Cardiac risk factors:  I have  suggested to his wife that when he is more      able from a pulmonary standpoint to undergo stress testing, that they      get a cardiology consultation and do so.  He is to avoid adenosine which      can      cause problems with bronchospasm.  7.  Urinary frequency:  The patient may be developing some prostate issues.      I will check a prostatic-specific antigen, and again he may need to see      a urologist as an outpatient.      Jonna L. Robb Matar, M.D.  Electronically Signed     JLB/MEDQ  D:  06/12/2005  T:  06/13/2005  Job:  [id number]

## 2011-01-17 NOTE — Assessment & Plan Note (Signed)
[name] [name] is back regarding his low back pain.  He has been doing very well with  the right shoulder injection after we performed this in March of this year.  Back pain remains stable at about 6/10.  He has been working a lot of hours,  and that definitely contributes to increased pain and tightness.  Sleep is  impaired at times.  The pain interferes with general activity, relations  with others and enjoyment of life on a moderate to severe level.  Pain is  worse with walking and bending.  It tends to be improved with his  medications and rest.  We discussed a lumbar corset today, and he wore one  at one point in time but has not been wearing one over the last year or to  due to his abdominal size.   REVIEW OF SYSTEMS:  The patient reports occasional numbness and tingling.  Knees are tender.  Low back pain as mentioned above.  The patient reports  some shortness of breath with exertion.   SOCIAL HISTORY:  The patient is living with his wife and children.  [name] [name],  his wife, is awaiting word from disability regarding her claim.   PHYSICAL EXAMINATION:  VITAL SIGNS:  Blood pressure 156/89, pulse of 80,  respiratory rate 16.  He is saturating 99% on room air.  GENERAL:  The patient is pleasant, in no acute distress.  He remains  abdominally obese.  MUSCULOSKELETAL/NEUROLOGIC:  He walks with an antalgic gait on either leg.  Posture is a bit flexed.  He does have more extension than on prior visits.  The right shoulder is without significant tenderness today.  Speed's test  was negative.  Impingement sign was negative.  Low back range of motion is  unchanged with active movements today.  No focal myofascial tenderness was  noted today.  Knees were stable but painful with some swelling.   ASSESSMENT:  1.  Chronic low back pain due to discogenic and facet sources.  2.  Moderate obesity.  3.  Osteoarthritis of the knees.  4.  Right rotator cuff tendinitis.   PLAN:  1.  Continue OxyContin 80 mg  q.i.d.  2.  The patient needs to continue working on weight and stretching      exercises.  3.  I will see him back for routine follow-up in three months' time.  He      will follow up with the [facility] clinic in a month.       ZTS/MedQ  D:  03/10/2005 13:03:48  T:  03/10/2005 13:36:28  Job #:  [id number]

## 2011-01-17 NOTE — Discharge Summary (Signed)
NAMEALTARIQ, [name] [name]              ACCOUNT NO.:  [id number]   MEDICAL RECORD NO.:  [id number]          PATIENT TYPE:  INP   LOCATION:  6735                         FACILITY:  [facility]   PHYSICIAN:  Lonia Blood, M.D.      DATE OF BIRTH:  1966-05-01   DATE OF ADMISSION:  06/12/2005  DATE OF DISCHARGE:  06/16/2005                                 DISCHARGE SUMMARY   PRIMARY CARE PHYSICIAN:  Talmadge Coventry, M.D.   DISCHARGE DIAGNOSES:  1.  Acute pneumonitis, probably viral.  2.  Tobacco abuse.  3.  Type 2 diabetes.  4.  Transient increase in liver function tests.  5.  Hypertension.  6.  Depression.  7.  Possible chronic pain with degenerative joint disease.   DISCHARGE MEDICATIONS:  1.  Avelox 400 mg daily x4 days.  2.  Lotrel 10/20 one tablet daily.  3.  Aspirin 81 mg daily.  4.  Combivir inhaler two puffs b.i.d.  5.  Wellbutrin XL 150 mg daily.  6.  Effexor 150 mg daily.  7.  OxyContin 80 mg q.i.d.  8.  Toprol XL 50 mg daily.  9.  Metformin XR 500 mg tablet two tablets daily.  10. Amaryl 2 mg daily.  11. Nicotine patch 21 mg daily.  12. Senokot S one tablet up to two tablets a day as needed.   DISPOSITION:  Patient is currently in better health.  She is still coughing  mildly but has improved tremendously compared with t his admission.  [name] in one to two weeks.  If his symptoms do not  completely disappear, he will have an outpatient follow-up with Dr. Sung Amabile.  He is also to continue taking his MDIs and the Combivir MDI for at least one  to two more weeks or until symptoms completely resolve.   PROCEDURE PERFORMED:  Chest CT without contrast performed on June 12, 2005, shows no evidence of pulmonary emboli.  There is bilateral perihilar  ground glass opacities. Differential diagnosis including bronchiolitis  obliterans, hypersensitivity pneumonia, usual interstitial pneumonia,  alveolar proteinosis or PCP.   CONSULTATION:  Oley Balm. Sung Amabile, M.D.   BRIEF HISTORY AND PHYSICAL:  Please refer to dictated history and physical  by Dr. Robb Matar.  In short, however, patient is a 45 year old male with  history of chronic tobacco smoke and diabetes who was admitted with severe  shortness of breath on June 12, 2005.  Patient was seen by his primary  care physician a couple of times.  He has had problems for 10 days prior to  admission.  His chest x-ray and CT findings were worrisome for possible  BOOP.  Hence, he was admitted for further management.   In the ED, patient was found to be short of breath as well as being slightly  tachypneic.  His ABG then showed a pH of 7.44, pCO2 39, pO2 only 54.  His  sats were 89% on room air.   HOSPITAL COURSE:  PROBLEM #1 -  SEVERE PNEUMONITIS:  Patient was admitted,  started on albuterol and Ativan  nebulizers, initially got some steroid dose.  He was also started on antibiotics, IV Avelox.  Pulmonary was consulted and  was seen by Dr. Billy Fischer, who made further suggestions including repeat  chest x-ray two days later.  HIV status was checked which was negative and  the typical pneumonia was thought to be the process.  Due to the abruptness  of the symptoms, the noninfectious causes were ruled out.  As such,  bronchoscopy was planned in case patient was HIV positive for there was no  improvement.  However, with the patient's improvement, no bronchoscopy was  thought to be desired.  He did very well on his current treatment and we  will discharge him to complete four more days of antibiotics and also given  him some MDIs to use.  His smoking definitely did not help matters.  Luckily  enough, patient has decided to quit after leaving the hospital.   PROBLEM #2 -  TOBACCO ABUSE:  As indicated, patient has not smoked since  arrival.  He has been on nicotine patch.  He has already been on Wellbutrin  which will help.  He now wants to quit totally.  We are encouraging him to  do  so.   PROBLEM #3 -  DIABETES:  Patient's hemoglobin A was 6.7 indicating fairly  good control but not very well.  His CBGs were in the 140s here in the  hospital.  Amaryl was added to his Glucophage and seems to have better  controlled his sugars.  We will keep him on that and further adjustment will  be done by his primary care physician.   PROBLEM #4 -  HYPERTENSION:  His blood pressure was mainly controlled on his  home regimen.  He was placed initially on Norvasc only in place of the  Lotrel and in addition to the Toprol.  However, we have switched him back  because Norvasc alone and the metoprolol is not adequate to control his  blood pressure.  He should continue taking his Lotrel at home.   PROBLEM #5 -  TRANSIENT INCREASE IN LIVER FUNCTION TESTS:  [name] but has trended downward.  This was thought to be primarily secondary to  maybe a shock liver.  Status panel was subsequently ordered which has so far  been negative.   PROBLEM #6 -  DEPRESSION:  Patient has severe depression and is on multiple  medications prior to arrival.  We made no changes to this regimen and  patient shall continue taking his medications as before.   LABORATORY DATA:  A 2-D echo was also checked to further evaluate the  patient's pulmonary findings and that showed a normal EF and no specific  change.   His sodium was 139, potassium 4.3, chloride 101, CO2 25, BUN 7, creatinine  0.9 and glucose 113.  Calcium 9.5.      Lonia Blood, M.D.  Electronically Signed     LG/MEDQ  D:  06/16/2005  T:  06/16/2005  Job:  [id number]   cc:   Talmadge Coventry, M.D.  Fax: [phone]

## 2011-01-17 NOTE — Op Note (Signed)
NAME:  [name] [name], [name] [name]                        ACCOUNT NO.:  [id number]   MEDICAL RECORD NO.:  [id number]                   PATIENT TYPE:  OUT   LOCATION:  XRAY                                 FACILITY:  [facility]   PHYSICIAN:  Celene Kras, MD                     DATE OF BIRTH:  1966/08/13   DATE OF PROCEDURE:  07/18/2003  DATE OF DISCHARGE:  05/10/2003                                 OPERATIVE REPORT   HISTORY:  [name] and I  evaluated and reviewed a health and history form for a 14 point review of  systems.   #1.  [name] has demonstrated a positive provocative block after a lumbar  facetal injection. He has had less perithoracic discomfort, and I think it  is reasonable to go ahead and proceed with a second facetal injection, right  and left side in anticipation of possible radiofrequency neural ablation. A  positive provocative block will lead [place] to consideration of radiofrequency  neural ablation.  #2.  Do not believe further imaging or diagnostics are warranted. I have  discussed this with him.  #3.  Lifestyle enhancement is reviewed, cigarette cessation, and weight  control for best outcome.  #4.  Followup with him. I am going to go ahead and schedule him for RF, but  I am not necessarily sure we are going to move in that direction. We need to  see strong response here. Clearly his paralumbar components are more  problematic than the thoracic. We would address this if necessary, but I am  going to give him a bit of time.  #5.  I am going to also recommend home based therapy and lifestyle  enhancements. Consider muscle stimulator.  #6.  I reviewed the risk of RF, I understands. He is consented for today's  procedure.   He is taking quite a bit of controlled substances at this time, and over  time we are going to need to wean him down, we may need help with an  addictionologist. I will defer to Dr. Riley Kill and I have discussed  this with  the nurse coordinator. The patient himself does want to get off these  medications and this is certainly a reasonable approach, as he is very  active at his work related activities, and he feels that these drugs  sometimes can cause a bit of sedation. I strongly cautioned, cautioning  driving, making important cognitive decisions and work related activities.   He has consented for today's procedure.   DESCRIPTION OF PROCEDURE:  The patient was taken to the fluoroscopy suite  and placed in prone position and the back prepped and draped in the usual  fashion. Using a 22 gauge spinal needle, I advanced to the facet medial  branch of L5-S1, L4-5, L3-4 and L2-3 as contributory innervation to confirm  placement in multiple fluoroscopic positions.  I then injected 1 mL of  lidocaine 1% MPF at each level with a total of 40 mg of Aristocort in  divided dose.   The patient tolerated the procedure well with no complications from our  procedure, appropriate recovery. Discharge instructions given. Will see him  in followup.                                               Celene Kras, MD    HH/MEDQ  D:  07/18/2003  T:  07/18/2003  Job:  [id number]

## 2011-01-17 NOTE — Assessment & Plan Note (Signed)
MEDICAL RECORD NUMBER:  [id number].   DATE OF BIRTH:  February 28, 1966.   [name] [name] is back regarding his low back pain.  He has been fairly stable  regarding his pain.  We tried to titrate his OxyContin down to 60 mg q.i.d.,  but he had a hard time tolerating this.  There were problems with his  insurance regarding the number of pills he received.  He went back up to 80  mg essentially q.i.d. as he had some of these left.  His pain level with  that dose is fairly well controlled.  He rates his pain on average at a  5/10.  He does report some occasional cramping in the left thigh.  He has  some pain between his shoulders as well.  He had really made no progress  regarding his weight gain.  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  Affect is fair, although he reports some depression being that it is the  holidays and he is limited financially.  The left knee remains tender at  times.   SOCIAL HISTORY:  He remains at home with his wife.  He is working full time  while she is not.   REVIEW OF SYSTEMS:  The patient reports some shortness of breath, coughing,  anxiety, depression and problems with sleep.  He denies nausea, vomiting,  diarrhea, constipation, fever, chills, weight change and skin breakdown.   PHYSICAL EXAMINATION:  The blood pressure is 118/74.  The pulse is 94.  The  respiratory rate is 16.  He is saturating 98% on room air.  The patient  walks with improved gait.  Posture is about the same and steady from last  visit.  He stands with much better extension than he did a couple of months  ago.  Affect is bright and fairly appropriate.  Appearance is well kept.  Abdominally he is still obese with no signs of obvious weight loss.  Muscles  in the low back remain tight and taut.  Lumbar flexion really has not  changed and remains 30 degrees essentially forward.  He has minimal back  extension at 0-5 degrees.  The extremities show no cyanosis, clubbing, or  edema.  Strength  is stable, as well as sensory exam.  Cognitively the  patient shows no abnormalities.  The cranial nerve exam was intact.   ASSESSMENT:  1.  Low back pain which is multifactorial, including discogenic, arthritic      and postural components.  2.  Obesity.  3.  Myofascial pain.  4.  Osteoarthritis of the knees.  5.  Depression.   PLAN:  1.  Will go back to the 80 mg q.i.d. of OxyContin.  The patient does well      with this.  Will try to stick with this for a while.  Consider slower      titration in the future.  I need to get a clarification from insurance      as to what our limits will be for the number of pills if the future if      we wish to taper.  2.  Encouraged ongoing weight loss and activity.  3.  Continue Effexor 150 mg q.a.m.  Consider additional medication for mood.      The patient did not want to go that route today.  4.  Will have the patient follow up in about three months' time here in the      pain clinic with one  of our P.A.'s.  He is relatively stable from a pain      standpoint at this time.      Zach   ZTS/MedQ  D:  08/14/2004 13:29:10  T:  08/14/2004 15:22:25  Job #:  [id number]

## 2011-01-17 NOTE — Assessment & Plan Note (Signed)
MEDICAL RECORD NUMBER:  [id number].   [name] [name] is back today with complaints of right shoulder pain. We have been  treating him chronically for low back pain which has been fairly stable. He  reports his back pain is at a 6 to 7/10 as is this new right shoulder pain.  He continues to work a lot overhead, do a lot of twisting, and repetitive  activities. Pain in general interferes with his general activity, relations  with others, and enjoyment of life. He is on OxyContin 80 mg q.6h. for  generalized pain control with good results at this point. He is working full  time as a Nutritional therapist. He has tried really no preventative measures or treatment  measures to the right shoulder at this time.   REVIEW OF SYSTEMS:  On review of systems, the patient reports some tingling  in the right hand and fair to depressed mood. Full review of systems is in  the health and history section of the chart.   SOCIAL HISTORY:  Pertinent positives listed above. He and his wife are  planning a trip to the beach in the next two weeks.   PHYSICAL EXAMINATION:  GENERAL:  Blood pressure 151/74, pulse 90,  respiratory rate 16. He is saturating 100% on room air. The patient is in  moderate distress but appropriate. He just got off work but is generally  well dressed.  HEART:  Regular rate and rhythm.  LUNGS:  Clear.   Posture is fair although he still tends to stand in a slightly flexed  position. He is extending better than previously. Low back exam is stable  with some generalized tenderness to palpation in range in lumbar spine.  Muscle spasm is noted as well. Right shoulder is examined, and bicipital  tendons were nonpainful. Speed's test was negative. Impingement sign was  positive on the right shoulder. The patient had good shoulder strength with  abduction today. Spurling's test was negative. No focal myofascial  tenderness was palpated.   ASSESSMENT:  1.  Chronic low back pain due to diskogenic and facet  sources.  2.  Moderate obesity.  3.  Osteoarthritis of the knees.  4.  New right shoulder pain consistent with right rotator cuff tendinitis.   PLAN:  1.  Continue OxyContin 80 mg q.i.d.  2.  After informed consent, we injected 3 cc of 1% lidocaine and 40 mg of      Kenalog into the right shoulder via posterior approach. The patient      tolerated this well.  3.  Will stay away from any anti-inflammatories due to prior poor tolerance.  4.  I will have the patient follow up with me per routine in three months.      If he has further problems, he will come      back to me sooner. I gave him an exercise packet which will help him      with shoulder strengthening and stabilization.      ZTS/MedQ  D:  11/29/2004 16:39:30  T:  11/30/2004 63:01:60  Job #:  [id number]   cc:   Talmadge Coventry, M.D.  [address]  [place]  [place] [address]  Fax: [phone]

## 2011-01-21 ENCOUNTER — Other Ambulatory Visit (INDEPENDENT_AMBULATORY_CARE_PROVIDER_SITE_OTHER): Payer: BC Managed Care – PPO

## 2011-01-21 ENCOUNTER — Ambulatory Visit (INDEPENDENT_AMBULATORY_CARE_PROVIDER_SITE_OTHER): Payer: BC Managed Care – PPO | Admitting: Endocrinology

## 2011-01-21 ENCOUNTER — Encounter: Payer: Self-pay | Admitting: Endocrinology

## 2011-01-21 DIAGNOSIS — I10 Essential (primary) hypertension: Secondary | ICD-10-CM

## 2011-01-21 DIAGNOSIS — E291 Testicular hypofunction: Secondary | ICD-10-CM

## 2011-01-21 DIAGNOSIS — R609 Edema, unspecified: Secondary | ICD-10-CM

## 2011-01-21 DIAGNOSIS — E119 Type 2 diabetes mellitus without complications: Secondary | ICD-10-CM

## 2011-01-21 LAB — BRAIN NATRIURETIC PEPTIDE: Pro B Natriuretic peptide (BNP): 20 pg/mL (ref 0.0–100.0)

## 2011-01-21 LAB — BASIC METABOLIC PANEL
BUN: 16 mg/dL (ref 6–23)
Calcium: 10 mg/dL (ref 8.4–10.5)
Creatinine, Ser: 1 mg/dL (ref 0.4–1.5)
GFR: 84.33 mL/min (ref >60.00)

## 2011-01-21 NOTE — Patient Instructions (Addendum)
blood tests are being ordered for you today.  please call [phone] to hear your test results.  You will be prompted to enter the 9-digit "MRN" number that appears at the top left of this page, followed by #.  Then you will hear the message. Stop demadex. check your blood sugar 2 times a day.  vary the time of day when you check, between before the 3 meals, and at bedtime.  also check if you have symptoms of your blood sugar being too high or too low.  please keep a record of the readings and bring it to your next appointment here.  please call us sooner if you are having low blood sugar episodes. change insulin to 80 units with breakfast, and 50 with the evening meal. (update: i left message on phone-tree:  Increase testim to 2x5g qd). (update: i left message on phone-tree:  Increase insulin to 110 units qam, and 40 units qpm.  Ret 1 month).

## 2011-01-21 NOTE — Progress Notes (Signed)
Subjective:    Patient ID: [name] [name], male    DOB: 1966-04-12, 45 y.o.   MRN: [id number]  HPI The state of at least three ongoing medical problems is addressed today: Hypogonadism: Pt says he feels slightly better, since he has been on the clomid. Dm: no cbg record, but states cbg's are sometimes low (50's), late at night.  It is highest during the day. Edema: Pt says he hasn't recently taken demadex.  No sob or leg swelling. Past Medical History  Diagnosis Date  . Diabetes mellitus   . Hyperlipidemia   . Hypertension   . GERD (gastroesophageal reflux disease)   . Lower back pain     No past surgical history on file.  History   Social History  . Marital Status: Married    Spouse Name: N/A    Number of Children: N/A  . Years of Education: N/A   Occupational History  . Not on file.   Social History Main Topics  . Smoking status: Current Everyday Smoker -- 2.0 packs/day    Types: Cigarettes  . Smokeless tobacco: Not on file  . Alcohol Use: No  . Drug Use: No  . Sexually Active:    Other Topics Concern  . Not on file   Social History Narrative   No regular exercise    Current Outpatient Prescriptions on File Prior to Visit  Medication Sig Dispense Refill  . amLODipine-olmesartan (AZOR) 10-40 MG per tablet Take 1 tablet by mouth daily.        Marland Kitchen aspirin 325 MG tablet Take 325 mg by mouth daily.        . Cholecalciferol (VITAMIN D3) 1000 UNITS CAPS Take 1 capsule by mouth daily.        Marland Kitchen glucose blood test strip 1 each by Other route 2 (two) times daily. Use as instructed       . Insulin Pen Needle (B-D UF III MINI PEN NEEDLES) 31G X 5 MM MISC by Does not apply route as directed.        Marland Kitchen ketoconazole (NIZORAL) 2 % cream Apply 1 application topically daily. Apply to foreskin two times a day for 10 days       . Lancets (ONETOUCH ULTRASOFT) lancets 1 each by Other route as needed. Use as instructed       . lovastatin (MEVACOR) 20 MG tablet Take 20 mg by mouth  daily.        Marland Kitchen morphine (MS CONTIN) 100 MG 12 hr tablet Take 200 mg by mouth 3 (three) times daily.        Marland Kitchen morphine (MSIR) 15 MG tablet Take 15 mg by mouth 2 (two) times daily as needed. For breakthrough pain       . potassium chloride SA (KLOR-CON M20) 20 MEQ tablet Take 20 mEq by mouth daily.        . promethazine (PHENERGAN) 25 MG tablet Take 25 mg by mouth every 6 (six) hours as needed.        . tadalafil (CIALIS) 20 MG tablet Take 20 mg by mouth. 1/2 to 1 tablet by mouth every 1-3 days       . testosterone (TESTIM) 50 MG/5GM GEL Place 5 g onto the skin daily.  30 Package  1  . varenicline (CHANTIX CONTINUING MONTH PAK) 1 MG tablet Take 1 mg by mouth 2 (two) times daily.        . varenicline (CHANTIX STARTING MONTH PAK) 0.5 MG X 11 &  1 MG X 42 tablet Take by mouth 2 (two) times daily. Take one 0.5mg  tablet by mouth once daily for 3 days, then increase to one 0.5mg  tablet twice daily for 3 days, then increase to one 1mg  tablet twice daily.         Allergies  Allergen Reactions  . Benazepril Hcl     REACTION: cough  . Nsaids     Family History  Problem Relation Age of Onset  . Heart disease Other     CAD male relative <60    BP 102/60  Pulse 75  Temp(Src) 98.3 F (36.8 C) (Oral)  Ht 5\' 10"  (1.778 m)  Wt 234 lb 3.2 oz (106.232 kg)  BMI 33.60 kg/m2  SpO2 97%    Review of Systems Denies decreased urinary stream.  Denies loc.     Objective:   Physical Exam GENITALIA: Normal male testicles, scrotum, and penis. Ext: no edema    Fructosamine= 516 (converts to a1c of 10.5) Lab Results  Component Value Date   WBC 10.3 06/04/2010   HGB 15.5 06/04/2010   HCT 44.2 06/04/2010   PLT 235.0 06/04/2010   CHOL 187 06/04/2010   TRIG 179.0* 06/04/2010   HDL 47.80 06/04/2010   LDLDIRECT 116.6 05/20/2007   ALT 28 06/04/2010   AST 28 06/04/2010   NA 137 01/21/2011   K 4.9 01/21/2011   CL 99 01/21/2011   CREATININE 1.0 01/21/2011   BUN 16 01/21/2011   CO2 30 01/21/2011   TSH 3.73  06/04/2010   PSA 0.20 06/04/2010   HGBA1C 12.4* 11/11/2010   Lab Results  Component Value Date   TESTOSTERONE 99.73* 01/21/2011      Assessment & Plan:  Edema, no rx needed now Dm.  Poor control Hypogonadism, needs increased rx

## 2011-01-24 ENCOUNTER — Telehealth: Payer: Self-pay | Admitting: *Deleted

## 2011-01-24 NOTE — Telephone Encounter (Signed)
OK thx  

## 2011-01-24 NOTE — Telephone Encounter (Signed)
Patient requesting RX RF's of Morphine CR 100 mg 2 tid #180 fill on or after  01/08/11 AND Morphine 15 mg 1 bid prn # 60 fill on or after 01/04/11. Next OV is June, Ok for REFILL?

## 2011-01-28 MED ORDER — MORPHINE SULFATE CR 100 MG PO TB12: 200.0000 mg | ORAL_TABLET | Freq: Three times a day (TID) | ORAL | Status: DC

## 2011-01-28 MED ORDER — MORPHINE SULFATE 15 MG PO TABS: 15.0000 mg | ORAL_TABLET | Freq: Two times a day (BID) | ORAL | Status: DC | PRN

## 2011-01-28 NOTE — Telephone Encounter (Signed)
Left vm for pt - request ready for pick up

## 2011-02-12 ENCOUNTER — Encounter: Payer: Self-pay | Admitting: Internal Medicine

## 2011-02-12 ENCOUNTER — Ambulatory Visit (INDEPENDENT_AMBULATORY_CARE_PROVIDER_SITE_OTHER): Payer: BC Managed Care – PPO | Admitting: Internal Medicine

## 2011-02-12 VITALS — BP 110/70 | HR 80 | Temp 97.0°F | Resp 16 | Wt 235.0 lb

## 2011-02-12 DIAGNOSIS — N529 Male erectile dysfunction, unspecified: Secondary | ICD-10-CM

## 2011-02-12 DIAGNOSIS — M545 Low back pain, unspecified: Secondary | ICD-10-CM

## 2011-02-12 DIAGNOSIS — IMO0002 Reserved for concepts with insufficient information to code with codable children: Secondary | ICD-10-CM

## 2011-02-12 DIAGNOSIS — E119 Type 2 diabetes mellitus without complications: Secondary | ICD-10-CM

## 2011-02-12 DIAGNOSIS — I10 Essential (primary) hypertension: Secondary | ICD-10-CM

## 2011-02-12 DIAGNOSIS — E1165 Type 2 diabetes mellitus with hyperglycemia: Secondary | ICD-10-CM

## 2011-02-12 DIAGNOSIS — IMO0001 Reserved for inherently not codable concepts without codable children: Secondary | ICD-10-CM

## 2011-02-12 MED ORDER — MORPHINE SULFATE 15 MG PO TABS: 15.0000 mg | ORAL_TABLET | Freq: Two times a day (BID) | ORAL | Status: DC | PRN

## 2011-02-12 MED ORDER — MORPHINE SULFATE CR 100 MG PO TB12: 200.0000 mg | ORAL_TABLET | Freq: Three times a day (TID) | ORAL | Status: DC

## 2011-02-12 NOTE — Assessment & Plan Note (Signed)
On Rx 

## 2011-02-12 NOTE — Assessment & Plan Note (Signed)
On Rx prn 

## 2011-02-12 NOTE — Progress Notes (Signed)
  Subjective:    Patient ID: [name] [name], male    DOB: 1965-12-20, 45 y.o.   MRN: [id number]  HPI   The patient presents for a follow-up of  Chronic LBP,  hypertension, chronic dyslipidemia, type 2 diabetes. Pain has been controlled with medicines.   Review of Systems  Constitutional: Positive for fatigue. Negative for appetite change and unexpected weight change.  HENT: Negative for nosebleeds, congestion, sore throat, sneezing, trouble swallowing and neck pain.   Eyes: Negative for itching and visual disturbance.  Respiratory: Negative for cough.   Cardiovascular: Negative for chest pain, palpitations and leg swelling.  Gastrointestinal: Negative for nausea, diarrhea, blood in stool and abdominal distention.  Genitourinary: Negative for frequency and hematuria.  Musculoskeletal: Positive for back pain, arthralgias and gait problem. Negative for joint swelling.  Skin: Negative for rash.  Neurological: Negative for dizziness, tremors, speech difficulty and weakness.  Psychiatric/Behavioral: Negative for sleep disturbance, dysphoric mood and agitation. The patient is not nervous/anxious.        Objective:   Physical Exam  Constitutional: He is oriented to person, place, and time. He appears well-developed.  HENT:  Mouth/Throat: Oropharynx is clear and moist.  Eyes: Conjunctivae are normal. Pupils are equal, round, and reactive to light.  Neck: Normal range of motion. No JVD present. No thyromegaly present.  Cardiovascular: Normal rate, regular rhythm, normal heart sounds and intact distal pulses.  Exam reveals no gallop and no friction rub.   No murmur heard. Pulmonary/Chest: Effort normal and breath sounds normal. No respiratory distress. He has no wheezes. He has no rales. He exhibits no tenderness.  Abdominal: Soft. Bowel sounds are normal. He exhibits no distension and no mass. There is no tenderness. There is no rebound and no guarding.  Musculoskeletal: Normal range of  motion. He exhibits no edema and no tenderness.       LS is tender  Lymphadenopathy:    He has no cervical adenopathy.  Neurological: He is alert and oriented to person, place, and time. He has normal reflexes. No cranial nerve deficit. He exhibits normal muscle tone. Coordination normal.  Skin: Skin is warm and dry. No rash noted.  Psychiatric: He has a normal mood and affect. His behavior is normal. Judgment and thought content normal.          Assessment & Plan:

## 2011-02-12 NOTE — Assessment & Plan Note (Signed)
Better. Cont Rx 

## 2011-02-12 NOTE — Assessment & Plan Note (Signed)
Filled Rxs

## 2011-05-16 ENCOUNTER — Encounter: Payer: Self-pay | Admitting: Internal Medicine

## 2011-05-16 ENCOUNTER — Ambulatory Visit (INDEPENDENT_AMBULATORY_CARE_PROVIDER_SITE_OTHER): Payer: BC Managed Care – PPO | Admitting: Internal Medicine

## 2011-05-16 VITALS — BP 112/80 | HR 76 | Temp 98.5°F | Resp 16 | Wt 237.0 lb

## 2011-05-16 DIAGNOSIS — M545 Low back pain, unspecified: Secondary | ICD-10-CM

## 2011-05-16 DIAGNOSIS — G510 Bell's palsy: Secondary | ICD-10-CM | POA: Insufficient documentation

## 2011-05-16 DIAGNOSIS — E119 Type 2 diabetes mellitus without complications: Secondary | ICD-10-CM

## 2011-05-16 MED ORDER — MORPHINE SULFATE 15 MG PO TABS: 15.0000 mg | ORAL_TABLET | Freq: Two times a day (BID) | ORAL | Status: DC | PRN

## 2011-05-16 MED ORDER — PREDNISONE 10 MG PO TABS: ORAL_TABLET | ORAL | Status: AC

## 2011-05-16 MED ORDER — MORPHINE SULFATE CR 100 MG PO TB12: 200.0000 mg | ORAL_TABLET | Freq: Three times a day (TID) | ORAL | Status: DC

## 2011-05-16 MED ORDER — ERYTHROMYCIN 5 MG/GM OP OINT: TOPICAL_OINTMENT | Freq: Two times a day (BID) | OPHTHALMIC | Status: AC

## 2011-05-16 NOTE — Assessment & Plan Note (Signed)
Improve Rx compliance Ophth cons

## 2011-05-16 NOTE — Progress Notes (Signed)
  Subjective:    Patient ID: [name] [name], male    DOB: 12-24-1965, 45 y.o.   MRN: [id number]  HPI  C/o R facial droop x 1 wk: went to [facility] and taking Famvir since 9/10  Review of Systems  Constitutional: Negative for appetite change, fatigue and unexpected weight change.  HENT: Negative for nosebleeds, congestion, sore throat, facial swelling, sneezing, trouble swallowing and neck pain.   Eyes: Negative for itching and visual disturbance.  Respiratory: Negative for cough.   Cardiovascular: Negative for chest pain, palpitations and leg swelling.  Gastrointestinal: Negative for nausea, diarrhea, blood in stool and abdominal distention.  Genitourinary: Negative for frequency and hematuria.  Musculoskeletal: Negative for back pain, joint swelling and gait problem.  Skin: Negative for rash.  Neurological: Positive for facial asymmetry. Negative for dizziness, tremors, speech difficulty and weakness.  Psychiatric/Behavioral: Negative for sleep disturbance, dysphoric mood and agitation. The patient is not nervous/anxious.        Objective:   Physical Exam  Constitutional: He is oriented to person, place, and time. He appears well-developed.  HENT:  Mouth/Throat: Oropharynx is clear and moist.  Eyes: Conjunctivae are normal. Pupils are equal, round, and reactive to light.  Neck: Normal range of motion. No JVD present. No thyromegaly present.  Cardiovascular: Normal rate, regular rhythm, normal heart sounds and intact distal pulses.  Exam reveals no gallop and no friction rub.   No murmur heard. Pulmonary/Chest: Effort normal and breath sounds normal. No respiratory distress. He has no wheezes. He has no rales. He exhibits no tenderness.  Abdominal: Soft. Bowel sounds are normal. He exhibits no distension and no mass. There is no tenderness. There is no rebound and no guarding.  Musculoskeletal: Normal range of motion. He exhibits no edema and no tenderness.  Lymphadenopathy:    He has  no cervical adenopathy.  Neurological: He is alert and oriented to person, place, and time. He has normal reflexes. A cranial nerve deficit (R facial droop) is present. He exhibits normal muscle tone. Coordination normal.  Skin: Skin is warm and dry. No rash noted.  Psychiatric: He has a normal mood and affect. His behavior is normal. Judgment and thought content normal.          Assessment & Plan:

## 2011-05-19 NOTE — Assessment & Plan Note (Signed)
Continue with current prescription therapy as reflected on the Med list.  

## 2011-06-04 LAB — CBC
HCT: 40.4
HCT: 42.6
Hemoglobin: 13.7
MCHC: 33.8
MCHC: 34.6
MCV: 91.8
Platelets: 283
RBC: 4.41
RDW: 13.2

## 2011-06-04 LAB — COMPREHENSIVE METABOLIC PANEL
ALT: 22
BUN: 24 — ABNORMAL HIGH
CO2: 28
CO2: 30
Calcium: 8.8
Calcium: 9.2
Creatinine, Ser: 1.13
Creatinine, Ser: 1.17
GFR calc Af Amer: >60
GFR calc non Af Amer: >60
GFR calc non Af Amer: >60
Glucose, Bld: 122 — ABNORMAL HIGH
Glucose, Bld: 126 — ABNORMAL HIGH

## 2011-06-04 LAB — DIFFERENTIAL
Basophils Absolute: 0
Basophils Relative: 0
Eosinophils Relative: 1
Lymphocytes Relative: 14
Neutro Abs: 9.2 — ABNORMAL HIGH

## 2011-06-04 LAB — GLUCOSE, CAPILLARY
Glucose-Capillary: 123 — ABNORMAL HIGH
Glucose-Capillary: 131 — ABNORMAL HIGH
Glucose-Capillary: 132 — ABNORMAL HIGH
Glucose-Capillary: 175 — ABNORMAL HIGH
Glucose-Capillary: 229 — ABNORMAL HIGH
Glucose-Capillary: 79

## 2011-06-04 LAB — T4, FREE: Free T4: 0.84 — ABNORMAL LOW

## 2011-06-04 LAB — POCT CARDIAC MARKERS
CKMB, poc: 2.7
Myoglobin, poc: 93.8
Troponin i, poc: <0.05

## 2011-06-04 LAB — LIPID PANEL
Cholesterol: 137
LDL Cholesterol: 49
Total CHOL/HDL Ratio: 4.9
Triglycerides: 298 — ABNORMAL HIGH
VLDL: 60 — ABNORMAL HIGH

## 2011-06-04 LAB — CARDIAC PANEL(CRET KIN+CKTOT+MB+TROPI)
CK, MB: 3.7
Relative Index: 1.5
Total CK: 222
Troponin I: <0.01

## 2011-06-04 LAB — HEMOGLOBIN A1C
Hgb A1c MFr Bld: 9.4 — ABNORMAL HIGH
Mean Plasma Glucose: 223

## 2011-06-04 LAB — B-NATRIURETIC PEPTIDE (CONVERTED LAB): Pro B Natriuretic peptide (BNP): <32

## 2011-06-04 LAB — TROPONIN I: Troponin I: 0.01

## 2011-06-04 LAB — CK TOTAL AND CKMB (NOT AT ARMC): Total CK: 213

## 2011-06-04 LAB — PROTIME-INR: Prothrombin Time: 14

## 2011-06-04 LAB — MAGNESIUM: Magnesium: 2

## 2011-06-04 LAB — PHOSPHORUS: Phosphorus: 3.1

## 2011-06-04 LAB — POCT I-STAT, CHEM 8
Calcium, Ion: 1.08 — ABNORMAL LOW
Chloride: 98
HCT: 45
Hemoglobin: 15.3

## 2011-06-04 LAB — APTT: aPTT: 28

## 2011-06-04 LAB — D-DIMER, QUANTITATIVE: D-Dimer, Quant: 0.24

## 2011-06-25 ENCOUNTER — Ambulatory Visit (INDEPENDENT_AMBULATORY_CARE_PROVIDER_SITE_OTHER): Payer: BC Managed Care – PPO | Admitting: Internal Medicine

## 2011-06-25 ENCOUNTER — Encounter: Payer: Self-pay | Admitting: Internal Medicine

## 2011-06-25 VITALS — BP 110/70 | HR 72 | Temp 98.2°F | Resp 16 | Wt 243.0 lb

## 2011-06-25 DIAGNOSIS — J310 Chronic rhinitis: Secondary | ICD-10-CM

## 2011-06-25 DIAGNOSIS — E119 Type 2 diabetes mellitus without complications: Secondary | ICD-10-CM

## 2011-06-25 DIAGNOSIS — E291 Testicular hypofunction: Secondary | ICD-10-CM

## 2011-06-25 DIAGNOSIS — M545 Low back pain, unspecified: Secondary | ICD-10-CM

## 2011-06-25 DIAGNOSIS — G510 Bell's palsy: Secondary | ICD-10-CM

## 2011-06-25 DIAGNOSIS — R404 Transient alteration of awareness: Secondary | ICD-10-CM

## 2011-06-25 DIAGNOSIS — Z23 Encounter for immunization: Secondary | ICD-10-CM

## 2011-06-25 MED ORDER — GLUCOSE BLOOD VI STRP: ORAL_STRIP | Status: DC

## 2011-06-25 MED ORDER — MORPHINE SULFATE 15 MG PO TABS: 15.0000 mg | ORAL_TABLET | Freq: Two times a day (BID) | ORAL | Status: DC | PRN

## 2011-06-25 MED ORDER — PROMETHAZINE HCL 25 MG PO TABS: 25.0000 mg | ORAL_TABLET | Freq: Four times a day (QID) | ORAL | Status: DC | PRN

## 2011-06-25 MED ORDER — MORPHINE SULFATE CR 100 MG PO TB12: 200.0000 mg | ORAL_TABLET | Freq: Three times a day (TID) | ORAL | Status: DC

## 2011-06-25 MED ORDER — LOVASTATIN 20 MG PO TABS: 20.0000 mg | ORAL_TABLET | Freq: Every day | ORAL | Status: DC

## 2011-06-25 MED ORDER — FLUTICASONE PROPIONATE 50 MCG/ACT NA SUSP: 2.0000 | Freq: Every day | NASAL | Status: DC

## 2011-06-25 MED ORDER — TESTOSTERONE MICRONIZED CRYS: CRYSTALS | Status: DC

## 2011-06-25 NOTE — Assessment & Plan Note (Signed)
Discussed.

## 2011-06-25 NOTE — Assessment & Plan Note (Signed)
Doing better per pt Continue with current prescription therapy as reflected on the Med list.

## 2011-06-25 NOTE — Assessment & Plan Note (Signed)
Chronic. Wife denies OSA

## 2011-06-25 NOTE — Assessment & Plan Note (Signed)
Continue with current prescription therapy as reflected on the Med list.  

## 2011-06-25 NOTE — Assessment & Plan Note (Signed)
Recovering  

## 2011-06-25 NOTE — Progress Notes (Signed)
  Subjective:    Patient ID: [name] [name], male    DOB: 1966/02/21, 45 y.o.   MRN: [id number]  HPI   The patient presents for a follow-up of  chronic hypertension, chronic dyslipidemia, type 2 diabetes, LBP controlled with medicines F/u hypogonadism - not using a gel - too $$$  Review of Systems  Constitutional: Positive for fatigue. Negative for appetite change and unexpected weight change.  HENT: Negative for nosebleeds, congestion, sore throat, sneezing, trouble swallowing and neck pain.   Eyes: Negative for itching and visual disturbance.  Respiratory: Positive for cough, shortness of breath and wheezing.   Cardiovascular: Negative for chest pain, palpitations and leg swelling.  Gastrointestinal: Negative for nausea, diarrhea, blood in stool and abdominal distention.  Genitourinary: Negative for frequency and hematuria.  Musculoskeletal: Positive for back pain. Negative for joint swelling and gait problem.  Skin: Negative for rash.  Neurological: Negative for dizziness, tremors, speech difficulty and weakness.  Psychiatric/Behavioral: Negative for sleep disturbance, dysphoric mood and agitation. The patient is nervous/anxious.        Objective:   Physical Exam  Constitutional: He is oriented to person, place, and time. He appears well-developed.  HENT:  Mouth/Throat: Oropharynx is clear and moist.  Eyes: Conjunctivae are normal. Pupils are equal, round, and reactive to light.  Neck: Normal range of motion. No JVD present. No thyromegaly present.  Cardiovascular: Normal rate, regular rhythm, normal heart sounds and intact distal pulses.  Exam reveals no gallop and no friction rub.   No murmur heard. Pulmonary/Chest: Effort normal and breath sounds normal. No respiratory distress. He has no wheezes. He has no rales. He exhibits no tenderness.  Abdominal: Soft. Bowel sounds are normal. He exhibits no distension and no mass. There is no tenderness. There is no rebound and no  guarding.  Musculoskeletal: Normal range of motion. He exhibits tenderness (LS is tender). He exhibits no edema.  Lymphadenopathy:    He has no cervical adenopathy.  Neurological: He is alert and oriented to person, place, and time. He has normal reflexes. No cranial nerve deficit. He exhibits normal muscle tone. Coordination normal.  Skin: Skin is warm and dry. No rash noted.  Psychiatric: He has a normal mood and affect. His behavior is normal. Judgment and thought content normal.          Assessment & Plan:

## 2011-08-27 ENCOUNTER — Encounter: Payer: Self-pay | Admitting: Internal Medicine

## 2011-08-27 ENCOUNTER — Ambulatory Visit (INDEPENDENT_AMBULATORY_CARE_PROVIDER_SITE_OTHER): Payer: BC Managed Care – PPO | Admitting: Internal Medicine

## 2011-08-27 DIAGNOSIS — I10 Essential (primary) hypertension: Secondary | ICD-10-CM

## 2011-08-27 DIAGNOSIS — F17200 Nicotine dependence, unspecified, uncomplicated: Secondary | ICD-10-CM

## 2011-08-27 DIAGNOSIS — M545 Low back pain, unspecified: Secondary | ICD-10-CM

## 2011-08-27 DIAGNOSIS — F329 Major depressive disorder, single episode, unspecified: Secondary | ICD-10-CM

## 2011-08-27 DIAGNOSIS — F3289 Other specified depressive episodes: Secondary | ICD-10-CM

## 2011-08-27 DIAGNOSIS — E291 Testicular hypofunction: Secondary | ICD-10-CM

## 2011-08-27 MED ORDER — MORPHINE SULFATE CR 100 MG PO TB12: 100.0000 mg | ORAL_TABLET | Freq: Three times a day (TID) | ORAL | Status: DC

## 2011-08-27 MED ORDER — MORPHINE SULFATE 15 MG PO TABS: 15.0000 mg | ORAL_TABLET | Freq: Two times a day (BID) | ORAL | Status: DC | PRN

## 2011-08-27 MED ORDER — MORPHINE SULFATE CR 100 MG PO TB12: 200.0000 mg | ORAL_TABLET | Freq: Three times a day (TID) | ORAL | Status: DC

## 2011-08-28 NOTE — Assessment & Plan Note (Signed)
Discussed compliance 

## 2011-08-28 NOTE — Progress Notes (Signed)
  Subjective:    Patient ID: [name] [name], male    DOB: [birth year], 45 y.o.   MRN: [id number]  HPI  The patient presents for a follow-up of  Chronic LBP, hypertension, chronic dyslipidemia, type 2 diabetes controlled with medicines    Review of Systems  Constitutional: Negative for appetite change, fatigue and unexpected weight change.  HENT: Negative for nosebleeds, congestion, sore throat, sneezing, trouble swallowing and neck pain.   Eyes: Negative for itching and visual disturbance.  Respiratory: Negative for cough.   Cardiovascular: Negative for chest pain, palpitations and leg swelling.  Gastrointestinal: Negative for nausea, diarrhea, blood in stool and abdominal distention.  Genitourinary: Negative for frequency and hematuria.  Musculoskeletal: Positive for back pain. Negative for joint swelling and gait problem.  Skin: Negative for rash.  Neurological: Negative for dizziness, tremors, speech difficulty and weakness.  Psychiatric/Behavioral: Negative for sleep disturbance, dysphoric mood and agitation. The patient is not nervous/anxious.        Objective:   Physical Exam  Constitutional: He is oriented to person, place, and time. He appears well-developed.  HENT:  Mouth/Throat: Oropharynx is clear and moist.  Eyes: Conjunctivae are normal. Pupils are equal, round, and reactive to light.  Neck: Normal range of motion. No JVD present. No thyromegaly present.  Cardiovascular: Normal rate, regular rhythm, normal heart sounds and intact distal pulses.  Exam reveals no gallop and no friction rub.   No murmur heard. Pulmonary/Chest: Effort normal and breath sounds normal. No respiratory distress. He has no wheezes. He has no rales. He exhibits no tenderness.  Abdominal: Soft. Bowel sounds are normal. He exhibits no distension and no mass. There is no tenderness. There is no rebound and no guarding.  Musculoskeletal: Normal range of motion. He exhibits tenderness (ls is  tender). He exhibits no edema.  Lymphadenopathy:    He has no cervical adenopathy.  Neurological: He is alert and oriented to person, place, and time. He has normal reflexes. No cranial nerve deficit. He exhibits normal muscle tone. Coordination normal.  Skin: Skin is warm and dry. No rash noted.  Psychiatric: He has a normal mood and affect. His behavior is normal. Judgment and thought content normal.          Assessment & Plan:

## 2011-08-28 NOTE — Assessment & Plan Note (Signed)
Refractory Discussed 

## 2011-08-28 NOTE — Assessment & Plan Note (Signed)
Chronic severe LBP, on opioids  Potential benefits of a long term opioids  use as well as potential risks  and complications were explained to the patient and were aknowledged. Continue with current prescription therapy as reflected on the Med list.  

## 2011-08-28 NOTE — Assessment & Plan Note (Signed)
Chronic  Risks associated with diet and treatment noncompliance were discussed. Compliance was encouraged.

## 2011-08-28 NOTE — Assessment & Plan Note (Signed)
Continue with current prescription therapy as reflected on the Med list.  

## 2011-10-31 ENCOUNTER — Ambulatory Visit (INDEPENDENT_AMBULATORY_CARE_PROVIDER_SITE_OTHER): Payer: BC Managed Care – PPO | Admitting: Internal Medicine

## 2011-10-31 ENCOUNTER — Other Ambulatory Visit (INDEPENDENT_AMBULATORY_CARE_PROVIDER_SITE_OTHER): Payer: BC Managed Care – PPO

## 2011-10-31 ENCOUNTER — Encounter: Payer: Self-pay | Admitting: Internal Medicine

## 2011-10-31 ENCOUNTER — Other Ambulatory Visit: Payer: Self-pay | Admitting: *Deleted

## 2011-10-31 DIAGNOSIS — M545 Low back pain, unspecified: Secondary | ICD-10-CM

## 2011-10-31 DIAGNOSIS — R05 Cough: Secondary | ICD-10-CM

## 2011-10-31 DIAGNOSIS — F3289 Other specified depressive episodes: Secondary | ICD-10-CM

## 2011-10-31 DIAGNOSIS — J209 Acute bronchitis, unspecified: Secondary | ICD-10-CM

## 2011-10-31 DIAGNOSIS — F329 Major depressive disorder, single episode, unspecified: Secondary | ICD-10-CM

## 2011-10-31 DIAGNOSIS — G510 Bell's palsy: Secondary | ICD-10-CM

## 2011-10-31 DIAGNOSIS — E119 Type 2 diabetes mellitus without complications: Secondary | ICD-10-CM

## 2011-10-31 DIAGNOSIS — E291 Testicular hypofunction: Secondary | ICD-10-CM

## 2011-10-31 DIAGNOSIS — E1165 Type 2 diabetes mellitus with hyperglycemia: Secondary | ICD-10-CM

## 2011-10-31 DIAGNOSIS — R404 Transient alteration of awareness: Secondary | ICD-10-CM

## 2011-10-31 DIAGNOSIS — IMO0002 Reserved for concepts with insufficient information to code with codable children: Secondary | ICD-10-CM

## 2011-10-31 DIAGNOSIS — R059 Cough, unspecified: Secondary | ICD-10-CM

## 2011-10-31 DIAGNOSIS — I10 Essential (primary) hypertension: Secondary | ICD-10-CM

## 2011-10-31 DIAGNOSIS — J310 Chronic rhinitis: Secondary | ICD-10-CM

## 2011-10-31 DIAGNOSIS — IMO0001 Reserved for inherently not codable concepts without codable children: Secondary | ICD-10-CM

## 2011-10-31 DIAGNOSIS — Z23 Encounter for immunization: Secondary | ICD-10-CM

## 2011-10-31 LAB — CBC WITH DIFFERENTIAL/PLATELET
Basophils Absolute: 0.1 10*3/uL (ref 0.0–0.1)
Basophils Relative: 1 % (ref 0.0–3.0)
Eosinophils Absolute: 0.2 10*3/uL (ref 0.0–0.7)
Lymphocytes Relative: 24.5 % (ref 12.0–46.0)
MCHC: 33.9 g/dL (ref 30.0–36.0)
MCV: 89.2 fl (ref 78.0–100.0)
Monocytes Absolute: 1 10*3/uL (ref 0.1–1.0)
Neutrophils Relative %: 65.8 % (ref 43.0–77.0)
Platelets: 231 10*3/uL (ref 150.0–400.0)
RBC: 4.97 Mil/uL (ref 4.22–5.81)
RDW: 12.7 % (ref 11.5–14.6)

## 2011-10-31 LAB — COMPREHENSIVE METABOLIC PANEL
AST: 18 U/L (ref 0–37)
Albumin: 3.8 g/dL (ref 3.5–5.2)
Alkaline Phosphatase: 78 U/L (ref 39–117)
BUN: 13 mg/dL (ref 6–23)
Glucose, Bld: 247 mg/dL — ABNORMAL HIGH (ref 70–99)
Potassium: 4.1 mEq/L (ref 3.5–5.1)
Sodium: 133 mEq/L — ABNORMAL LOW (ref 135–145)
Total Bilirubin: 0.2 mg/dL — ABNORMAL LOW (ref 0.3–1.2)

## 2011-10-31 LAB — HEPATIC FUNCTION PANEL
ALT: 20 U/L (ref 0–53)
AST: 18 U/L (ref 0–37)
Bilirubin, Direct: 0.1 mg/dL (ref 0.0–0.3)
Total Bilirubin: 0.2 mg/dL — ABNORMAL LOW (ref 0.3–1.2)

## 2011-10-31 LAB — LIPID PANEL
Cholesterol: 148 mg/dL (ref 0–200)
HDL: 44.3 mg/dL (ref >39.00)
LDL Cholesterol: 74 mg/dL (ref 0–99)
VLDL: 29.6 mg/dL (ref 0.0–40.0)

## 2011-10-31 LAB — BASIC METABOLIC PANEL
BUN: 13 mg/dL (ref 6–23)
Chloride: 99 mEq/L (ref 96–112)
GFR: 116.7 mL/min (ref >60.00)
Potassium: 4.1 mEq/L (ref 3.5–5.1)
Sodium: 133 mEq/L — ABNORMAL LOW (ref 135–145)

## 2011-10-31 LAB — TESTOSTERONE: Testosterone: 80.32 ng/dL — ABNORMAL LOW (ref 350.00–890.00)

## 2011-10-31 LAB — HEMOGLOBIN A1C: Hgb A1c MFr Bld: 10.8 % — ABNORMAL HIGH (ref 4.6–6.5)

## 2011-10-31 MED ORDER — DOXYCYCLINE HYCLATE 100 MG PO TABS: 100.0000 mg | ORAL_TABLET | Freq: Two times a day (BID) | ORAL | Status: AC

## 2011-10-31 MED ORDER — MORPHINE SULFATE CR 100 MG PO TB12: 200.0000 mg | ORAL_TABLET | Freq: Three times a day (TID) | ORAL | Status: DC

## 2011-10-31 MED ORDER — ONETOUCH ULTRASOFT LANCETS MISC: Status: DC

## 2011-10-31 MED ORDER — MORPHINE SULFATE 15 MG PO TABS: 15.0000 mg | ORAL_TABLET | Freq: Two times a day (BID) | ORAL | Status: DC | PRN

## 2011-10-31 NOTE — Assessment & Plan Note (Signed)
Continue with current prescription therapy as reflected on the Med list.  

## 2011-10-31 NOTE — Progress Notes (Signed)
Patient ID: [name] [name], male   DOB: 1965/10/02, 46 y.o.   MRN: [id number]  Subjective:    Patient ID: [name] [name], male    DOB: 1966-05-29, 46 y.o.   MRN: [id number]  HPI  The patient presents for a follow-up of  Chronic LBP, hypertension, chronic dyslipidemia, type 2 diabetes controlled with medicines Cut back on cigs -- 1ppd  Wt Readings from Last 3 Encounters:  10/31/11 244 lb (110.678 kg)  08/27/11 246 lb (111.585 kg)  06/25/11 243 lb (110.224 kg)   BP Readings from Last 3 Encounters:  10/31/11 120/88  08/27/11 130/88  06/25/11 110/70      Review of Systems  Constitutional: Negative for appetite change, fatigue and unexpected weight change.  HENT: Negative for nosebleeds, congestion, sore throat, sneezing, trouble swallowing and neck pain.   Eyes: Negative for itching and visual disturbance.  Respiratory: Negative for cough.   Cardiovascular: Negative for chest pain, palpitations and leg swelling.  Gastrointestinal: Negative for nausea, diarrhea, blood in stool and abdominal distention.  Genitourinary: Negative for frequency and hematuria.  Musculoskeletal: Positive for back pain. Negative for joint swelling and gait problem.  Skin: Negative for rash.  Neurological: Negative for dizziness, tremors, speech difficulty and weakness.  Psychiatric/Behavioral: Negative for sleep disturbance, dysphoric mood and agitation. The patient is not nervous/anxious.        Objective:   Physical Exam  Constitutional: He is oriented to person, place, and time. He appears well-developed.  HENT:  Mouth/Throat: Oropharynx is clear and moist.  Eyes: Conjunctivae are normal. Pupils are equal, round, and reactive to light.  Neck: Normal range of motion. No JVD present. No thyromegaly present.  Cardiovascular: Normal rate, regular rhythm, normal heart sounds and intact distal pulses.  Exam reveals no gallop and no friction rub.   No murmur heard. Pulmonary/Chest: Effort normal and  breath sounds normal. No respiratory distress. He has no wheezes. He has no rales. He exhibits no tenderness.  Abdominal: Soft. Bowel sounds are normal. He exhibits no distension and no mass. There is no tenderness. There is no rebound and no guarding.  Musculoskeletal: Normal range of motion. He exhibits tenderness (ls is tender). He exhibits no edema.  Lymphadenopathy:    He has no cervical adenopathy.  Neurological: He is alert and oriented to person, place, and time. He has normal reflexes. No cranial nerve deficit. He exhibits normal muscle tone. Coordination normal.  Skin: Skin is warm and dry. No rash noted.  Psychiatric: He has a normal mood and affect. His behavior is normal. Judgment and thought content normal.   Lab Results  Component Value Date   WBC 10.3 06/04/2010   HGB 15.5 06/04/2010   HCT 44.2 06/04/2010   PLT 235.0 06/04/2010   GLUCOSE 278* 01/21/2011   CHOL 187 06/04/2010   TRIG 179.0* 06/04/2010   HDL 47.80 06/04/2010   LDLDIRECT 116.6 05/20/2007   LDLCALC 103* 06/04/2010   ALT 28 06/04/2010   AST 28 06/04/2010   NA 137 01/21/2011   K 4.9 01/21/2011   CL 99 01/21/2011   CREATININE 1.0 01/21/2011   BUN 16 01/21/2011   CO2 30 01/21/2011   TSH 3.73 06/04/2010   PSA 0.20 06/04/2010   INR 1.1 05/10/2008   HGBA1C 12.4* 11/11/2010          Assessment & Plan:

## 2011-10-31 NOTE — Assessment & Plan Note (Signed)
Cutting back on smoking

## 2011-10-31 NOTE — Assessment & Plan Note (Signed)
Doxy x 10 d 

## 2011-11-25 ENCOUNTER — Other Ambulatory Visit: Payer: Self-pay | Admitting: *Deleted

## 2011-11-25 MED ORDER — LOVASTATIN 20 MG PO TABS: 20.0000 mg | ORAL_TABLET | Freq: Every day | ORAL | Status: DC

## 2012-01-02 ENCOUNTER — Telehealth: Payer: Self-pay

## 2012-01-02 NOTE — Telephone Encounter (Signed)
Patient wife called requesting medication refills that will be due 01/08/12. Patient is requesting pain med refill

## 2012-01-05 NOTE — Telephone Encounter (Signed)
OK to fill this prescription with additional refills x0 Thank you!  

## 2012-01-06 MED ORDER — MORPHINE SULFATE 15 MG PO TABS: 15.0000 mg | ORAL_TABLET | Freq: Two times a day (BID) | ORAL | Status: DC | PRN

## 2012-01-06 MED ORDER — MORPHINE SULFATE CR 100 MG PO TB12: 200.0000 mg | ORAL_TABLET | Freq: Three times a day (TID) | ORAL | Status: DC

## 2012-01-06 NOTE — Telephone Encounter (Signed)
Addended by: Merrilyn Puma on: 01/06/2012 08:23 AM   Modules accepted: Orders

## 2012-01-06 NOTE — Telephone Encounter (Signed)
Left detailed mess at above number advising pt Rxs are upfront ready for p/u.

## 2012-01-06 NOTE — Telephone Encounter (Signed)
Rxs printed/pending MD sig.  

## 2012-01-14 ENCOUNTER — Encounter: Payer: Self-pay | Admitting: Internal Medicine

## 2012-01-14 ENCOUNTER — Ambulatory Visit (INDEPENDENT_AMBULATORY_CARE_PROVIDER_SITE_OTHER): Payer: BC Managed Care – PPO | Admitting: Internal Medicine

## 2012-01-14 VITALS — BP 118/88 | HR 80 | Temp 97.7°F | Resp 16 | Wt 239.0 lb

## 2012-01-14 DIAGNOSIS — I10 Essential (primary) hypertension: Secondary | ICD-10-CM

## 2012-01-14 DIAGNOSIS — N529 Male erectile dysfunction, unspecified: Secondary | ICD-10-CM

## 2012-01-14 DIAGNOSIS — E785 Hyperlipidemia, unspecified: Secondary | ICD-10-CM

## 2012-01-14 DIAGNOSIS — M545 Low back pain, unspecified: Secondary | ICD-10-CM

## 2012-01-14 DIAGNOSIS — E119 Type 2 diabetes mellitus without complications: Secondary | ICD-10-CM

## 2012-01-14 DIAGNOSIS — E669 Obesity, unspecified: Secondary | ICD-10-CM

## 2012-01-14 MED ORDER — MORPHINE SULFATE ER 100 MG PO TBCR: 200.0000 mg | EXTENDED_RELEASE_TABLET | Freq: Three times a day (TID) | ORAL | Status: DC

## 2012-01-14 MED ORDER — MORPHINE SULFATE 15 MG PO TABS: 15.0000 mg | ORAL_TABLET | Freq: Two times a day (BID) | ORAL | Status: DC | PRN

## 2012-01-14 NOTE — Assessment & Plan Note (Signed)
Continue with current prescription therapy as reflected on the Med list.  

## 2012-01-14 NOTE — Patient Instructions (Signed)
Wt Readings from Last 3 Encounters:  01/14/12 239 lb (108.41 kg)  10/31/11 244 lb (110.678 kg)  08/27/11 246 lb (111.585 kg)    

## 2012-01-14 NOTE — Progress Notes (Signed)
Patient ID: [name] [name], male   DOB: 12-16-1965, 46 y.o.   MRN: [id number] Patient ID: [name] [name], male   DOB: 1966/02/06, 46 y.o.   MRN: [id number]  Subjective:    Patient ID: [name] [name], male    DOB: Aug 19, 1966, 46 y.o.   MRN: [id number]  HPI  The patient presents for a follow-up of  Chronic LBP, hypertension, chronic dyslipidemia, type 2 diabetes controlled with medicines Cut back on cigs -- 1/2 ppd - "blue light"  Wt Readings from Last 3 Encounters:  01/14/12 239 lb (108.41 kg)  10/31/11 244 lb (110.678 kg)  08/27/11 246 lb (111.585 kg)   BP Readings from Last 3 Encounters:  01/14/12 118/88  10/31/11 120/88  08/27/11 130/88      Review of Systems  Constitutional: Negative for appetite change, fatigue and unexpected weight change.  HENT: Negative for nosebleeds, congestion, sore throat, sneezing, trouble swallowing and neck pain.   Eyes: Negative for itching and visual disturbance.  Respiratory: Negative for cough.   Cardiovascular: Negative for chest pain, palpitations and leg swelling.  Gastrointestinal: Negative for nausea, diarrhea, blood in stool and abdominal distention.  Genitourinary: Negative for frequency and hematuria.  Musculoskeletal: Positive for back pain. Negative for joint swelling and gait problem.  Skin: Negative for rash.  Neurological: Negative for dizziness, tremors, speech difficulty and weakness.  Psychiatric/Behavioral: Negative for sleep disturbance, dysphoric mood and agitation. The patient is not nervous/anxious.        Objective:   Physical Exam  Constitutional: He is oriented to person, place, and time. He appears well-developed.  HENT:  Mouth/Throat: Oropharynx is clear and moist.  Eyes: Conjunctivae are normal. Pupils are equal, round, and reactive to light.  Neck: Normal range of motion. No JVD present. No thyromegaly present.  Cardiovascular: Normal rate, regular rhythm, normal heart sounds and intact distal pulses.   Exam reveals no gallop and no friction rub.   No murmur heard. Pulmonary/Chest: Effort normal and breath sounds normal. No respiratory distress. He has no wheezes. He has no rales. He exhibits no tenderness.  Abdominal: Soft. Bowel sounds are normal. He exhibits no distension and no mass. There is no tenderness. There is no rebound and no guarding.  Musculoskeletal: Normal range of motion. He exhibits tenderness (ls is tender). He exhibits no edema.  Lymphadenopathy:    He has no cervical adenopathy.  Neurological: He is alert and oriented to person, place, and time. He has normal reflexes. No cranial nerve deficit. He exhibits normal muscle tone. Coordination normal.  Skin: Skin is warm and dry. No rash noted.  Psychiatric: He has a normal mood and affect. His behavior is normal. Judgment and thought content normal.   Lab Results  Component Value Date   WBC 13.8* 10/31/2011   HGB 15.0 10/31/2011   HCT 44.3 10/31/2011   PLT 231.0 10/31/2011   GLUCOSE 247* 10/31/2011   GLUCOSE 247* 10/31/2011   CHOL 148 10/31/2011   TRIG 148.0 10/31/2011   HDL 44.30 10/31/2011   LDLDIRECT 116.6 05/20/2007   LDLCALC 74 10/31/2011   ALT 20 10/31/2011   ALT 20 10/31/2011   AST 18 10/31/2011   AST 18 10/31/2011   NA 133* 10/31/2011   NA 133* 10/31/2011   K 4.1 10/31/2011   K 4.1 10/31/2011   CL 99 10/31/2011   CL 99 10/31/2011   CREATININE 0.8 10/31/2011   CREATININE 0.8 10/31/2011   BUN 13 10/31/2011   BUN 13 10/31/2011   CO2 26  10/31/2011   CO2 26 10/31/2011   TSH 3.73 06/04/2010   PSA 0.20 06/04/2010   INR 1.1 05/10/2008   HGBA1C 10.8* 10/31/2011          Assessment & Plan:

## 2012-01-14 NOTE — Assessment & Plan Note (Signed)
Chronic severe LBP, on opioids  Potential benefits of a long term opioids  use as well as potential risks  and complications were explained to the patient and were aknowledged. Continue with current prescription therapy as reflected on the Med list.  

## 2012-01-14 NOTE — Assessment & Plan Note (Signed)
Wt Readings from Last 3 Encounters:  01/14/12 239 lb (108.41 kg)  10/31/11 244 lb (110.678 kg)  08/27/11 246 lb (111.585 kg)

## 2012-01-23 ENCOUNTER — Other Ambulatory Visit: Payer: Self-pay

## 2012-01-23 MED ORDER — AMLODIPINE-OLMESARTAN 10-40 MG PO TABS: 1.0000 | ORAL_TABLET | Freq: Every day | ORAL | Status: DC

## 2012-01-27 ENCOUNTER — Other Ambulatory Visit: Payer: Self-pay | Admitting: *Deleted

## 2012-01-27 MED ORDER — AMLODIPINE-OLMESARTAN 10-40 MG PO TABS: 1.0000 | ORAL_TABLET | Freq: Every day | ORAL | Status: DC

## 2012-03-09 ENCOUNTER — Other Ambulatory Visit: Payer: Self-pay | Admitting: Internal Medicine

## 2012-03-09 ENCOUNTER — Other Ambulatory Visit: Payer: Self-pay | Admitting: Endocrinology

## 2012-03-09 MED ORDER — INSULIN LISPRO PROT & LISPRO (75-25 MIX) 100 UNIT/ML ~~LOC~~ SUSP: SUBCUTANEOUS | Status: DC

## 2012-03-22 ENCOUNTER — Ambulatory Visit: Payer: BC Managed Care – PPO | Admitting: Internal Medicine

## 2012-03-30 ENCOUNTER — Other Ambulatory Visit: Payer: Self-pay

## 2012-03-31 MED ORDER — MORPHINE SULFATE 15 MG PO TABS: 15.0000 mg | ORAL_TABLET | Freq: Two times a day (BID) | ORAL | Status: DC | PRN

## 2012-03-31 MED ORDER — MORPHINE SULFATE ER 100 MG PO TBCR: 200.0000 mg | EXTENDED_RELEASE_TABLET | Freq: Three times a day (TID) | ORAL | Status: DC

## 2012-03-31 NOTE — Telephone Encounter (Signed)
Patient informed by phone that prescriptions requested are ready for pickup at the front desk.

## 2012-04-28 ENCOUNTER — Telehealth: Payer: Self-pay | Admitting: Internal Medicine

## 2012-04-28 ENCOUNTER — Other Ambulatory Visit (INDEPENDENT_AMBULATORY_CARE_PROVIDER_SITE_OTHER): Payer: BC Managed Care – PPO

## 2012-04-28 ENCOUNTER — Encounter: Payer: Self-pay | Admitting: Internal Medicine

## 2012-04-28 ENCOUNTER — Ambulatory Visit (INDEPENDENT_AMBULATORY_CARE_PROVIDER_SITE_OTHER): Payer: BC Managed Care – PPO | Admitting: Internal Medicine

## 2012-04-28 VITALS — BP 112/70 | HR 76 | Temp 98.5°F | Resp 16 | Wt 245.0 lb

## 2012-04-28 DIAGNOSIS — M545 Low back pain, unspecified: Secondary | ICD-10-CM

## 2012-04-28 DIAGNOSIS — E291 Testicular hypofunction: Secondary | ICD-10-CM

## 2012-04-28 DIAGNOSIS — N529 Male erectile dysfunction, unspecified: Secondary | ICD-10-CM

## 2012-04-28 DIAGNOSIS — E119 Type 2 diabetes mellitus without complications: Secondary | ICD-10-CM

## 2012-04-28 DIAGNOSIS — I10 Essential (primary) hypertension: Secondary | ICD-10-CM

## 2012-04-28 DIAGNOSIS — E785 Hyperlipidemia, unspecified: Secondary | ICD-10-CM

## 2012-04-28 DIAGNOSIS — E669 Obesity, unspecified: Secondary | ICD-10-CM

## 2012-04-28 LAB — BASIC METABOLIC PANEL
Calcium: 9.2 mg/dL (ref 8.4–10.5)
Creatinine, Ser: 0.8 mg/dL (ref 0.4–1.5)
GFR: 109.76 mL/min (ref >60.00)

## 2012-04-28 LAB — TESTOSTERONE: Testosterone: 129.96 ng/dL — ABNORMAL LOW (ref 350.00–890.00)

## 2012-04-28 LAB — HEMOGLOBIN A1C: Hgb A1c MFr Bld: 10.1 % — ABNORMAL HIGH (ref 4.6–6.5)

## 2012-04-28 MED ORDER — MORPHINE SULFATE ER 100 MG PO TBCR: 200.0000 mg | EXTENDED_RELEASE_TABLET | Freq: Three times a day (TID) | ORAL | Status: DC

## 2012-04-28 MED ORDER — MORPHINE SULFATE 15 MG PO TABS: 15.0000 mg | ORAL_TABLET | Freq: Two times a day (BID) | ORAL | Status: DC | PRN

## 2012-04-28 NOTE — Assessment & Plan Note (Signed)
Not using

## 2012-04-28 NOTE — Telephone Encounter (Signed)
[name] [name], please, inform patient that all labs are normal except for low testost 123 and poor DM control with A1c 10 Thx

## 2012-04-28 NOTE — Progress Notes (Signed)
Subjective:    Patient ID: [name] [name], male    DOB: Dec 08, 1965, 46 y.o.   MRN: [id number]  HPI  The patient presents for a follow-up of  Chronic LBP, hypertension, chronic dyslipidemia, type 2 diabetes controlled with medicines. Not using testosterone... Cut back on cigs -- 1/2 ppd - "blue light", not doing too well  Wt Readings from Last 3 Encounters:  04/28/12 245 lb (111.131 kg)  01/14/12 239 lb (108.41 kg)  10/31/11 244 lb (110.678 kg)   BP Readings from Last 3 Encounters:  04/28/12 112/70  01/14/12 118/88  10/31/11 120/88      Review of Systems  Constitutional: Negative for appetite change, fatigue and unexpected weight change.  HENT: Negative for nosebleeds, congestion, sore throat, sneezing, trouble swallowing and neck pain.   Eyes: Negative for itching and visual disturbance.  Respiratory: Negative for cough.   Cardiovascular: Negative for chest pain, palpitations and leg swelling.  Gastrointestinal: Negative for nausea, diarrhea, blood in stool and abdominal distention.  Genitourinary: Negative for frequency and hematuria.  Musculoskeletal: Positive for back pain. Negative for joint swelling and gait problem.  Skin: Negative for rash.  Neurological: Negative for dizziness, tremors, speech difficulty and weakness.  Psychiatric/Behavioral: Negative for disturbed wake/sleep cycle, dysphoric mood and agitation. The patient is not nervous/anxious.        Objective:   Physical Exam  Constitutional: He is oriented to person, place, and time. He appears well-developed.  HENT:  Mouth/Throat: Oropharynx is clear and moist.  Eyes: Conjunctivae are normal. Pupils are equal, round, and reactive to light.  Neck: Normal range of motion. No JVD present. No thyromegaly present.  Cardiovascular: Normal rate, regular rhythm, normal heart sounds and intact distal pulses.  Exam reveals no gallop and no friction rub.   No murmur heard. Pulmonary/Chest: Effort normal and  breath sounds normal. No respiratory distress. He has no wheezes. He has no rales. He exhibits no tenderness.  Abdominal: Soft. Bowel sounds are normal. He exhibits no distension and no mass. There is no tenderness. There is no rebound and no guarding.  Musculoskeletal: Normal range of motion. He exhibits tenderness (ls is tender). He exhibits no edema.  Lymphadenopathy:    He has no cervical adenopathy.  Neurological: He is alert and oriented to person, place, and time. He has normal reflexes. No cranial nerve deficit. He exhibits normal muscle tone. Coordination normal.  Skin: Skin is warm and dry. No rash noted.  Psychiatric: He has a normal mood and affect. His behavior is normal. Judgment and thought content normal.   Lab Results  Component Value Date   WBC 13.8* 10/31/2011   HGB 15.0 10/31/2011   HCT 44.3 10/31/2011   PLT 231.0 10/31/2011   GLUCOSE 247* 10/31/2011   GLUCOSE 247* 10/31/2011   CHOL 148 10/31/2011   TRIG 148.0 10/31/2011   HDL 44.30 10/31/2011   LDLDIRECT 116.6 05/20/2007   LDLCALC 74 10/31/2011   ALT 20 10/31/2011   ALT 20 10/31/2011   AST 18 10/31/2011   AST 18 10/31/2011   NA 133* 10/31/2011   NA 133* 10/31/2011   K 4.1 10/31/2011   K 4.1 10/31/2011   CL 99 10/31/2011   CL 99 10/31/2011   CREATININE 0.8 10/31/2011   CREATININE 0.8 10/31/2011   BUN 13 10/31/2011   BUN 13 10/31/2011   CO2 26 10/31/2011   CO2 26 10/31/2011   TSH 3.73 06/04/2010   PSA 0.20 06/04/2010   INR 1.1 05/10/2008   HGBA1C  10.8* 10/31/2011          Assessment & Plan:

## 2012-04-28 NOTE — Assessment & Plan Note (Signed)
Continue with current prescription therapy as reflected on the Med list.  

## 2012-04-29 NOTE — Telephone Encounter (Signed)
Left mess for patient to call back.  

## 2012-05-05 NOTE — Telephone Encounter (Signed)
Pts wife informed.

## 2012-08-04 ENCOUNTER — Ambulatory Visit (INDEPENDENT_AMBULATORY_CARE_PROVIDER_SITE_OTHER): Payer: BC Managed Care – PPO | Admitting: Internal Medicine

## 2012-08-04 ENCOUNTER — Encounter: Payer: Self-pay | Admitting: Internal Medicine

## 2012-08-04 VITALS — BP 120/76 | HR 76 | Temp 97.2°F | Resp 16 | Wt 252.0 lb

## 2012-08-04 DIAGNOSIS — M545 Low back pain, unspecified: Secondary | ICD-10-CM

## 2012-08-04 DIAGNOSIS — E669 Obesity, unspecified: Secondary | ICD-10-CM

## 2012-08-04 DIAGNOSIS — Z23 Encounter for immunization: Secondary | ICD-10-CM

## 2012-08-04 DIAGNOSIS — I10 Essential (primary) hypertension: Secondary | ICD-10-CM

## 2012-08-04 DIAGNOSIS — E119 Type 2 diabetes mellitus without complications: Secondary | ICD-10-CM

## 2012-08-04 DIAGNOSIS — E291 Testicular hypofunction: Secondary | ICD-10-CM

## 2012-08-04 MED ORDER — MORPHINE SULFATE 15 MG PO TABS: 15.0000 mg | ORAL_TABLET | Freq: Two times a day (BID) | ORAL | Status: DC | PRN

## 2012-08-04 MED ORDER — MORPHINE SULFATE ER 100 MG PO TBCR: 200.0000 mg | EXTENDED_RELEASE_TABLET | Freq: Three times a day (TID) | ORAL | Status: DC

## 2012-08-04 NOTE — Assessment & Plan Note (Signed)
Continue with current prescription therapy as reflected on the Med list.  

## 2012-08-04 NOTE — Assessment & Plan Note (Signed)
Will sch appt w/Dr Everardo All

## 2012-08-04 NOTE — Assessment & Plan Note (Signed)
Dr Everardo All Poor compliance Risks associated with treatment noncompliance were discussed. Compliance was encouraged.

## 2012-08-04 NOTE — Progress Notes (Signed)
   Subjective:    Patient ID: [name] [name], male    DOB: [birth year], 46 y.o.   MRN: [id number]  HPI  The patient presents for a follow-up of  Chronic LBP, hypertension, chronic dyslipidemia, type 2 diabetes controlled with medicines. Not using testosterone... Cut back on cigs -- 1/2 ppd - "blue light", not doing too well  Wt Readings from Last 3 Encounters:  08/04/12 252 lb (114.306 kg)  04/28/12 245 lb (111.131 kg)  01/14/12 239 lb (108.41 kg)   BP Readings from Last 3 Encounters:  08/04/12 120/76  04/28/12 112/70  01/14/12 118/88      Review of Systems  Constitutional: Negative for appetite change, fatigue and unexpected weight change.  HENT: Negative for nosebleeds, congestion, sore throat, sneezing, trouble swallowing and neck pain.   Eyes: Negative for itching and visual disturbance.  Respiratory: Negative for cough.   Cardiovascular: Negative for chest pain, palpitations and leg swelling.  Gastrointestinal: Negative for nausea, diarrhea, blood in stool and abdominal distention.  Genitourinary: Negative for frequency and hematuria.  Musculoskeletal: Positive for back pain. Negative for joint swelling and gait problem.  Skin: Negative for rash.  Neurological: Negative for dizziness, tremors, speech difficulty and weakness.  Psychiatric/Behavioral: Negative for sleep disturbance, dysphoric mood and agitation. The patient is not nervous/anxious.        Objective:   Physical Exam  Constitutional: He is oriented to person, place, and time. He appears well-developed.  HENT:  Mouth/Throat: Oropharynx is clear and moist.  Eyes: Conjunctivae normal are normal. Pupils are equal, round, and reactive to light.  Neck: Normal range of motion. No JVD present. No thyromegaly present.  Cardiovascular: Normal rate, regular rhythm, normal heart sounds and intact distal pulses.  Exam reveals no gallop and no friction rub.   No murmur heard. Pulmonary/Chest: Effort normal and breath  sounds normal. No respiratory distress. He has no wheezes. He has no rales. He exhibits no tenderness.  Abdominal: Soft. Bowel sounds are normal. He exhibits no distension and no mass. There is no tenderness. There is no rebound and no guarding.  Musculoskeletal: Normal range of motion. He exhibits tenderness (ls is tender). He exhibits no edema.  Lymphadenopathy:    He has no cervical adenopathy.  Neurological: He is alert and oriented to person, place, and time. He has normal reflexes. No cranial nerve deficit. He exhibits normal muscle tone. Coordination normal.  Skin: Skin is warm and dry. No rash noted.  Psychiatric: He has a normal mood and affect. His behavior is normal. Judgment and thought content normal.   Lab Results  Component Value Date   WBC 13.8* 10/31/2011   HGB 15.0 10/31/2011   HCT 44.3 10/31/2011   PLT 231.0 10/31/2011   GLUCOSE 150* 04/28/2012   CHOL 148 10/31/2011   TRIG 148.0 10/31/2011   HDL 44.30 10/31/2011   LDLDIRECT 116.6 05/20/2007   LDLCALC 74 10/31/2011   ALT 20 10/31/2011   ALT 20 10/31/2011   AST 18 10/31/2011   AST 18 10/31/2011   NA 135 04/28/2012   K 4.4 04/28/2012   CL 102 04/28/2012   CREATININE 0.8 04/28/2012   BUN 16 04/28/2012   CO2 26 04/28/2012   TSH 3.73 06/04/2010   PSA 0.20 06/04/2010   INR 1.1 05/10/2008   HGBA1C 10.1* 04/28/2012          Assessment & Plan:

## 2012-08-04 NOTE — Assessment & Plan Note (Signed)
Wt Readings from Last 3 Encounters:  08/04/12 252 lb (114.306 kg)  04/28/12 245 lb (111.131 kg)  01/14/12 239 lb (108.41 kg)

## 2012-08-17 ENCOUNTER — Ambulatory Visit: Payer: BC Managed Care – PPO | Admitting: Endocrinology

## 2012-08-28 ENCOUNTER — Other Ambulatory Visit: Payer: Self-pay | Admitting: Internal Medicine

## 2012-08-30 ENCOUNTER — Other Ambulatory Visit: Payer: Self-pay | Admitting: *Deleted

## 2012-08-30 MED ORDER — INSULIN PEN NEEDLE 31G X 5 MM MISC: 1.0000 | Status: DC

## 2012-09-17 ENCOUNTER — Ambulatory Visit (INDEPENDENT_AMBULATORY_CARE_PROVIDER_SITE_OTHER): Payer: BC Managed Care – PPO | Admitting: Endocrinology

## 2012-09-17 VITALS — BP 132/70 | HR 78 | Temp 98.7°F | Wt 244.0 lb

## 2012-09-17 DIAGNOSIS — E119 Type 2 diabetes mellitus without complications: Secondary | ICD-10-CM

## 2012-09-17 MED ORDER — INSULIN GLARGINE 100 UNIT/ML ~~LOC~~ SOLN: 140.0000 [IU] | Freq: Every day | SUBCUTANEOUS | Status: DC

## 2012-09-17 NOTE — Progress Notes (Signed)
Subjective:    Patient ID: [name] [name], male    DOB: [birth year], 47 y.o.   MRN: [id number]  HPI Pt returns for f/u of insulin-requiring DM: no cbg record, but states cbg's vary from 60-200's.  It is most often low before lunch, on days he is active.  It is highest in am.  needs increased rx. Past Medical History  Diagnosis Date  . Diabetes mellitus   . Hyperlipidemia   . Hypertension   . GERD (gastroesophageal reflux disease)   . Lower back pain     No past surgical history on file.  History   Social History  . Marital Status: Married    Spouse Name: N/A    Number of Children: N/A  . Years of Education: N/A   Occupational History  . Not on file.   Social History Main Topics  . Smoking status: Current Every Day Smoker -- 2.0 packs/day    Types: Cigarettes  . Smokeless tobacco: Not on file  . Alcohol Use: No  . Drug Use: No  . Sexually Active: Yes   Other Topics Concern  . Not on file   Social History Narrative   No regular exercise    Current Outpatient Prescriptions on File Prior to Visit  Medication Sig Dispense Refill  . amLODipine-olmesartan (AZOR) 10-40 MG per tablet Take 1 tablet by mouth daily.  30 tablet  11  . aspirin 325 MG tablet Take 325 mg by mouth daily.        . Cholecalciferol (VITAMIN D3) 1000 UNITS CAPS Take 1 capsule by mouth daily.        . famciclovir (FAMVIR) 500 MG tablet Take 500 mg by mouth 3 (three) times daily.        . fluticasone (FLONASE) 50 MCG/ACT nasal spray Place 2 sprays into the nose daily.  42 g  3  . glucose blood test strip Use as instructed      . insulin glargine (LANTUS SOLOSTAR) 100 UNIT/ML injection Inject 140 Units into the skin daily. And pen needles 1/day  20 pen  11  . insulin lispro protamine-insulin lispro (HUMALOG 75/25) (75-25) 100 UNIT/ML SUSP Inject subcutaneously 110 units with breakfast and 40 units with the evening meal  45 mL  2  . Insulin Pen Needle (B-D UF III MINI PEN NEEDLES) 31G X 5 MM MISC 1 each  by Does not apply route as directed.  100 each  5  . Lancets (ONETOUCH ULTRASOFT) lancets Use as directed. Please dispense OneTouch Delica Lancets.  100 each  3  . lovastatin (MEVACOR) 20 MG tablet TAKE 1 TABLET BY MOUTH EVERY DAY  30 tablet  5  . morphine (MS CONTIN) 100 MG 12 hr tablet Take 2 tablets (200 mg total) by mouth 3 (three) times daily. Please fill on or after 10/06/12  180 tablet  0  . morphine (MSIR) 15 MG tablet Take 1 tablet (15 mg total) by mouth 2 (two) times daily as needed. For breakthrough pain  Fill on or after 10/06/12  60 tablet  0  . nebivolol (BYSTOLIC) 10 MG tablet Take 10 mg by mouth daily.      . ONE TOUCH ULTRA TEST test strip USE TWICE DAILY AS INSTRUCTED  100 each  3  . promethazine (PHENERGAN) 25 MG tablet Take 1 tablet (25 mg total) by mouth every 6 (six) hours as needed.  30 tablet  1  . tadalafil (CIALIS) 20 MG tablet Take 20 mg by mouth. 1/2  to 1 tablet by mouth every 1-3 days       . Testosterone Micronized CRYS 1 ml on skin q am  30 g  5    Allergies  Allergen Reactions  . Benazepril Hcl     REACTION: cough  . Nsaids     Family History  Problem Relation Age of Onset  . Heart disease Other     CAD male relative <60  . Hypertension Mother   . Diabetes Mother   . Vision loss Sister     BP 132/70  Pulse 78  Temp 98.7 F (37.1 C) (Oral)  Wt 244 lb (110.678 kg)  SpO2 97%   Review of Systems Denies LOC    Objective:   Physical Exam VITAL SIGNS:  See vs page GENERAL: no distress Pulses: dorsalis pedis intact bilat.   Feet: no deformity.  no ulcer on the feet.  feet are of normal color and temp.  no edema Neuro: sensation is intact to touch on the feet.       Assessment & Plan:  DM: he may do better in a simpler regimen

## 2012-09-17 NOTE — Patient Instructions (Addendum)
Change your current insulin to lantus, 140 units daily. check your blood sugar 2 times a day.  vary the time of day when you check, between before the 3 meals, and at bedtime.  also check if you have symptoms of your blood sugar being too high or too low.  please keep a record of the readings and bring it to your next appointment here.  please call us sooner if you are having low blood sugar episodes. Please come back for a follow-up appointment in 1 month.

## 2012-11-02 ENCOUNTER — Encounter: Payer: Self-pay | Admitting: Internal Medicine

## 2012-11-02 ENCOUNTER — Ambulatory Visit (INDEPENDENT_AMBULATORY_CARE_PROVIDER_SITE_OTHER): Payer: BC Managed Care – PPO | Admitting: Internal Medicine

## 2012-11-02 VITALS — BP 148/90 | HR 80 | Temp 98.0°F | Resp 16 | Wt 246.0 lb

## 2012-11-02 DIAGNOSIS — I10 Essential (primary) hypertension: Secondary | ICD-10-CM

## 2012-11-02 DIAGNOSIS — J209 Acute bronchitis, unspecified: Secondary | ICD-10-CM

## 2012-11-02 DIAGNOSIS — F17200 Nicotine dependence, unspecified, uncomplicated: Secondary | ICD-10-CM

## 2012-11-02 DIAGNOSIS — E119 Type 2 diabetes mellitus without complications: Secondary | ICD-10-CM

## 2012-11-02 MED ORDER — MORPHINE SULFATE ER 100 MG PO TBCR: 200.0000 mg | EXTENDED_RELEASE_TABLET | Freq: Three times a day (TID) | ORAL | Status: DC

## 2012-11-02 MED ORDER — MORPHINE SULFATE 15 MG PO TABS: 15.0000 mg | ORAL_TABLET | Freq: Two times a day (BID) | ORAL | Status: DC | PRN

## 2012-11-02 MED ORDER — CEFUROXIME AXETIL 500 MG PO TABS: 500.0000 mg | ORAL_TABLET | Freq: Two times a day (BID) | ORAL | Status: DC

## 2012-11-02 NOTE — Assessment & Plan Note (Signed)
He will try to switch to e-cigs

## 2012-11-02 NOTE — Assessment & Plan Note (Signed)
Continue with current prescription therapy as reflected on the Med list.  

## 2012-11-02 NOTE — Progress Notes (Signed)
   Subjective:    HPI  The patient presents for a follow-up of  Chronic LBP, hypertension, chronic dyslipidemia, type 2 diabetes controlled with medicines. Not using testosterone... He is trying to cut back on cigs -- 11/2 ppd and blue e-cig, not doing too well...  C/o URI sx's x 3-4 d - yellow d/c and cough, ST  Wt Readings from Last 3 Encounters:  11/02/12 246 lb (111.585 kg)  09/17/12 244 lb (110.678 kg)  08/04/12 252 lb (114.306 kg)   BP Readings from Last 3 Encounters:  11/02/12 148/90  09/17/12 132/70  08/04/12 120/76      Review of Systems  Constitutional: Negative for appetite change, fatigue and unexpected weight change.  HENT: Positive for congestion and postnasal drip. Negative for nosebleeds, sore throat, sneezing, trouble swallowing and neck pain.   Eyes: Negative for itching and visual disturbance.  Respiratory: Positive for cough.   Cardiovascular: Negative for chest pain, palpitations and leg swelling.  Gastrointestinal: Negative for nausea, diarrhea, blood in stool and abdominal distention.  Genitourinary: Negative for frequency and hematuria.  Musculoskeletal: Positive for back pain. Negative for joint swelling and gait problem.  Skin: Negative for rash.  Neurological: Negative for dizziness, tremors, speech difficulty and weakness.  Psychiatric/Behavioral: Negative for sleep disturbance, dysphoric mood and agitation. The patient is not nervous/anxious.        Objective:   Physical Exam  Constitutional: He is oriented to person, place, and time. He appears well-developed.  HENT:  eryth throat  Eyes: Conjunctivae are normal. Pupils are equal, round, and reactive to light.  Neck: Normal range of motion. No JVD present. No thyromegaly present.  Cardiovascular: Normal rate, regular rhythm, normal heart sounds and intact distal pulses.  Exam reveals no gallop and no friction rub.   No murmur heard. Pulmonary/Chest: Effort normal. No respiratory distress.  He has wheezes. He has no rales. He exhibits no tenderness.  Abdominal: Soft. Bowel sounds are normal. He exhibits no distension and no mass. There is no tenderness. There is no rebound and no guarding.  Musculoskeletal: Normal range of motion. He exhibits tenderness (ls is tender). He exhibits no edema.  Lymphadenopathy:    He has no cervical adenopathy.  Neurological: He is alert and oriented to person, place, and time. He has normal reflexes. No cranial nerve deficit. He exhibits normal muscle tone. Coordination normal.  Skin: Skin is warm and dry. No rash noted.  Psychiatric: He has a normal mood and affect. His behavior is normal. Judgment and thought content normal.   Lab Results  Component Value Date   WBC 13.8* 10/31/2011   HGB 15.0 10/31/2011   HCT 44.3 10/31/2011   PLT 231.0 10/31/2011   GLUCOSE 150* 04/28/2012   CHOL 148 10/31/2011   TRIG 148.0 10/31/2011   HDL 44.30 10/31/2011   LDLDIRECT 116.6 05/20/2007   LDLCALC 74 10/31/2011   ALT 20 10/31/2011   ALT 20 10/31/2011   AST 18 10/31/2011   AST 18 10/31/2011   NA 135 04/28/2012   K 4.4 04/28/2012   CL 102 04/28/2012   CREATININE 0.8 04/28/2012   BUN 16 04/28/2012   CO2 26 04/28/2012   TSH 3.73 06/04/2010   PSA 0.20 06/04/2010   INR 1.1 05/10/2008   HGBA1C 10.1* 04/28/2012          Assessment & Plan:

## 2012-11-02 NOTE — Assessment & Plan Note (Signed)
Ceftin x10 d 

## 2013-02-02 ENCOUNTER — Encounter: Payer: Self-pay | Admitting: Internal Medicine

## 2013-02-02 ENCOUNTER — Other Ambulatory Visit (INDEPENDENT_AMBULATORY_CARE_PROVIDER_SITE_OTHER): Payer: BC Managed Care – PPO

## 2013-02-02 ENCOUNTER — Ambulatory Visit (INDEPENDENT_AMBULATORY_CARE_PROVIDER_SITE_OTHER): Payer: BC Managed Care – PPO | Admitting: Internal Medicine

## 2013-02-02 VITALS — BP 150/92 | HR 80 | Temp 98.7°F | Resp 16 | Wt 249.0 lb

## 2013-02-02 DIAGNOSIS — M545 Low back pain, unspecified: Secondary | ICD-10-CM

## 2013-02-02 DIAGNOSIS — F329 Major depressive disorder, single episode, unspecified: Secondary | ICD-10-CM

## 2013-02-02 DIAGNOSIS — F3289 Other specified depressive episodes: Secondary | ICD-10-CM

## 2013-02-02 DIAGNOSIS — E785 Hyperlipidemia, unspecified: Secondary | ICD-10-CM

## 2013-02-02 DIAGNOSIS — E119 Type 2 diabetes mellitus without complications: Secondary | ICD-10-CM

## 2013-02-02 DIAGNOSIS — J209 Acute bronchitis, unspecified: Secondary | ICD-10-CM

## 2013-02-02 LAB — HEPATIC FUNCTION PANEL
ALT: 27 U/L (ref 0–53)
Alkaline Phosphatase: 66 U/L (ref 39–117)
Bilirubin, Direct: 0.1 mg/dL (ref 0.0–0.3)
Total Bilirubin: 0.4 mg/dL (ref 0.3–1.2)
Total Protein: 7 g/dL (ref 6.0–8.3)

## 2013-02-02 LAB — LIPID PANEL
Cholesterol: 125 mg/dL (ref 0–200)
LDL Cholesterol: 78 mg/dL (ref 0–99)

## 2013-02-02 LAB — BASIC METABOLIC PANEL
BUN: 14 mg/dL (ref 6–23)
Chloride: 104 mEq/L (ref 96–112)
Creatinine, Ser: 0.8 mg/dL (ref 0.4–1.5)
Glucose, Bld: 207 mg/dL — ABNORMAL HIGH (ref 70–99)

## 2013-02-02 LAB — HEMOGLOBIN A1C: Hgb A1c MFr Bld: 9.2 % — ABNORMAL HIGH (ref 4.6–6.5)

## 2013-02-02 MED ORDER — MORPHINE SULFATE 15 MG PO TABS: 15.0000 mg | ORAL_TABLET | Freq: Two times a day (BID) | ORAL | Status: DC | PRN

## 2013-02-02 MED ORDER — MORPHINE SULFATE ER 100 MG PO TBCR: 200.0000 mg | EXTENDED_RELEASE_TABLET | Freq: Three times a day (TID) | ORAL | Status: DC

## 2013-02-02 NOTE — Progress Notes (Signed)
   Subjective:    HPI  The patient presents for a follow-up of  chronic LBP, hypertension, chronic dyslipidemia, type 2 diabetes controlled with medicines. Not using testosterone... He is trying to cut back on cigs -- 11/2 ppd and blue e-cig, not doing too well yet..   Wt Readings from Last 3 Encounters:  02/02/13 249 lb (112.946 kg)  11/02/12 246 lb (111.585 kg)  09/17/12 244 lb (110.678 kg)   BP Readings from Last 3 Encounters:  02/02/13 150/92  11/02/12 148/90  09/17/12 132/70      Review of Systems  Constitutional: Negative for appetite change, fatigue and unexpected weight change.  HENT: Positive for congestion and postnasal drip. Negative for nosebleeds, sore throat, sneezing, trouble swallowing and neck pain.   Eyes: Negative for itching and visual disturbance.  Respiratory: Positive for cough.   Cardiovascular: Negative for chest pain, palpitations and leg swelling.  Gastrointestinal: Negative for nausea, diarrhea, blood in stool and abdominal distention.  Genitourinary: Negative for frequency and hematuria.  Musculoskeletal: Positive for back pain. Negative for joint swelling and gait problem.  Skin: Negative for rash.  Neurological: Negative for dizziness, tremors, speech difficulty and weakness.  Psychiatric/Behavioral: Negative for sleep disturbance, dysphoric mood and agitation. The patient is not nervous/anxious.        Objective:   Physical Exam  Constitutional: He is oriented to person, place, and time. He appears well-developed.  HENT:  eryth throat  Eyes: Conjunctivae are normal. Pupils are equal, round, and reactive to light.  Neck: Normal range of motion. No JVD present. No thyromegaly present.  Cardiovascular: Normal rate, regular rhythm, normal heart sounds and intact distal pulses.  Exam reveals no gallop and no friction rub.   No murmur heard. Pulmonary/Chest: Effort normal. No respiratory distress. He has wheezes. He has no rales. He exhibits no  tenderness.  Abdominal: Soft. Bowel sounds are normal. He exhibits no distension and no mass. There is no tenderness. There is no rebound and no guarding.  Musculoskeletal: Normal range of motion. He exhibits tenderness (ls is tender). He exhibits no edema.  Lymphadenopathy:    He has no cervical adenopathy.  Neurological: He is alert and oriented to person, place, and time. He has normal reflexes. No cranial nerve deficit. He exhibits normal muscle tone. Coordination normal.  Skin: Skin is warm and dry. No rash noted.  Psychiatric: He has a normal mood and affect. His behavior is normal. Judgment and thought content normal.   Lab Results  Component Value Date   WBC 13.8* 10/31/2011   HGB 15.0 10/31/2011   HCT 44.3 10/31/2011   PLT 231.0 10/31/2011   GLUCOSE 150* 04/28/2012   CHOL 148 10/31/2011   TRIG 148.0 10/31/2011   HDL 44.30 10/31/2011   LDLDIRECT 116.6 05/20/2007   LDLCALC 74 10/31/2011   ALT 20 10/31/2011   ALT 20 10/31/2011   AST 18 10/31/2011   AST 18 10/31/2011   NA 135 04/28/2012   K 4.4 04/28/2012   CL 102 04/28/2012   CREATININE 0.8 04/28/2012   BUN 16 04/28/2012   CO2 26 04/28/2012   TSH 3.73 06/04/2010   PSA 0.20 06/04/2010   INR 1.1 05/10/2008   HGBA1C 10.1* 04/28/2012          Assessment & Plan:

## 2013-02-02 NOTE — Assessment & Plan Note (Signed)
Continue with current prescription therapy as reflected on the Med list.  

## 2013-02-02 NOTE — Assessment & Plan Note (Signed)
Continue with current prescription therapy as reflected on the Med list. Wt Readings from Last 3 Encounters:  02/02/13 249 lb (112.946 kg)  11/02/12 246 lb (111.585 kg)  09/17/12 244 lb (110.678 kg)

## 2013-02-03 LAB — DRUG SCREEN, URINE
Amphetamine Screen, Ur: NEGATIVE
Barbiturate Quant, Ur: NEGATIVE
Cocaine Metabolites: NEGATIVE
Methadone: NEGATIVE
Opiates: POSITIVE — AB

## 2013-02-28 ENCOUNTER — Other Ambulatory Visit: Payer: Self-pay | Admitting: Internal Medicine

## 2013-03-01 ENCOUNTER — Other Ambulatory Visit: Payer: Self-pay | Admitting: Internal Medicine

## 2013-05-04 ENCOUNTER — Ambulatory Visit (INDEPENDENT_AMBULATORY_CARE_PROVIDER_SITE_OTHER): Payer: BC Managed Care – PPO | Admitting: Internal Medicine

## 2013-05-04 ENCOUNTER — Encounter: Payer: Self-pay | Admitting: Internal Medicine

## 2013-05-04 VITALS — BP 124/72 | HR 76 | Temp 97.9°F | Resp 16 | Wt 255.0 lb

## 2013-05-04 DIAGNOSIS — E669 Obesity, unspecified: Secondary | ICD-10-CM

## 2013-05-04 DIAGNOSIS — M545 Low back pain, unspecified: Secondary | ICD-10-CM

## 2013-05-04 DIAGNOSIS — E119 Type 2 diabetes mellitus without complications: Secondary | ICD-10-CM

## 2013-05-04 DIAGNOSIS — Z23 Encounter for immunization: Secondary | ICD-10-CM

## 2013-05-04 DIAGNOSIS — I10 Essential (primary) hypertension: Secondary | ICD-10-CM

## 2013-05-04 MED ORDER — MORPHINE SULFATE ER 100 MG PO TBCR: 200.0000 mg | EXTENDED_RELEASE_TABLET | Freq: Three times a day (TID) | ORAL | Status: DC

## 2013-05-04 MED ORDER — MORPHINE SULFATE 15 MG PO TABS: 15.0000 mg | ORAL_TABLET | Freq: Two times a day (BID) | ORAL | Status: DC | PRN

## 2013-05-04 MED ORDER — LORCASERIN HCL 10 MG PO TABS: 1.0000 | ORAL_TABLET | Freq: Two times a day (BID) | ORAL | Status: DC

## 2013-05-04 NOTE — Assessment & Plan Note (Signed)
Worse We will try Belviq

## 2013-05-04 NOTE — Patient Instructions (Addendum)
[email or web address]  Wt Readings from Last 3 Encounters:  05/04/13 255 lb (115.667 kg)  02/02/13 249 lb (112.946 kg)  11/02/12 246 lb (111.585 kg)

## 2013-05-04 NOTE — Assessment & Plan Note (Signed)
Continue with current prescription therapy as reflected on the Med list.  

## 2013-05-04 NOTE — Assessment & Plan Note (Signed)
Continue with current prescription therapy as reflected on the Med list. Wt loss discussed

## 2013-05-04 NOTE — Progress Notes (Signed)
   Subjective:    HPI  The patient presents for a follow-up of  chronic LBP, hypertension, chronic dyslipidemia, type 2 diabetes controlled with medicines. Not using testosterone... He is trying to cut back on cigs -- 11/2 ppd and blue e-cig, not doing too well yet..   Wt Readings from Last 3 Encounters:  05/04/13 255 lb (115.667 kg)  02/02/13 249 lb (112.946 kg)  11/02/12 246 lb (111.585 kg)   BP Readings from Last 3 Encounters:  05/04/13 124/72  02/02/13 150/92  11/02/12 148/90      Review of Systems  Constitutional: Negative for appetite change, fatigue and unexpected weight change.  HENT: Positive for congestion and postnasal drip. Negative for nosebleeds, sore throat, sneezing, trouble swallowing and neck pain.   Eyes: Negative for itching and visual disturbance.  Respiratory: Positive for cough.   Cardiovascular: Negative for chest pain, palpitations and leg swelling.  Gastrointestinal: Negative for nausea, diarrhea, blood in stool and abdominal distention.  Genitourinary: Negative for frequency and hematuria.  Musculoskeletal: Positive for back pain. Negative for joint swelling and gait problem.  Skin: Negative for rash.  Neurological: Negative for dizziness, tremors, speech difficulty and weakness.  Psychiatric/Behavioral: Negative for sleep disturbance, dysphoric mood and agitation. The patient is not nervous/anxious.        Objective:   Physical Exam  Constitutional: He is oriented to person, place, and time. He appears well-developed.  HENT:  eryth throat  Eyes: Conjunctivae are normal. Pupils are equal, round, and reactive to light.  Neck: Normal range of motion. No JVD present. No thyromegaly present.  Cardiovascular: Normal rate, regular rhythm, normal heart sounds and intact distal pulses.  Exam reveals no gallop and no friction rub.   No murmur heard. Pulmonary/Chest: Effort normal. No respiratory distress. He has wheezes. He has no rales. He exhibits no  tenderness.  Abdominal: Soft. Bowel sounds are normal. He exhibits no distension and no mass. There is no tenderness. There is no rebound and no guarding.  Musculoskeletal: Normal range of motion. He exhibits tenderness (ls is tender). He exhibits no edema.  Lymphadenopathy:    He has no cervical adenopathy.  Neurological: He is alert and oriented to person, place, and time. He has normal reflexes. No cranial nerve deficit. He exhibits normal muscle tone. Coordination normal.  Skin: Skin is warm and dry. No rash noted.  Psychiatric: He has a normal mood and affect. His behavior is normal. Judgment and thought content normal.   Lab Results  Component Value Date   WBC 13.8* 10/31/2011   HGB 15.0 10/31/2011   HCT 44.3 10/31/2011   PLT 231.0 10/31/2011   GLUCOSE 207* 02/02/2013   CHOL 125 02/02/2013   TRIG 63.0 02/02/2013   HDL 34.30* 02/02/2013   LDLDIRECT 116.6 05/20/2007   LDLCALC 78 02/02/2013   ALT 27 02/02/2013   AST 23 02/02/2013   NA 137 02/02/2013   K 4.2 02/02/2013   CL 104 02/02/2013   CREATININE 0.8 02/02/2013   BUN 14 02/02/2013   CO2 27 02/02/2013   TSH 3.73 06/04/2010   PSA 0.20 06/04/2010   INR 1.1 05/10/2008   HGBA1C 9.2* 02/02/2013          Assessment & Plan:

## 2013-07-04 ENCOUNTER — Other Ambulatory Visit: Payer: Self-pay | Admitting: Internal Medicine

## 2013-08-03 ENCOUNTER — Ambulatory Visit (INDEPENDENT_AMBULATORY_CARE_PROVIDER_SITE_OTHER): Payer: BC Managed Care – PPO | Admitting: Internal Medicine

## 2013-08-03 ENCOUNTER — Encounter: Payer: Self-pay | Admitting: Internal Medicine

## 2013-08-03 ENCOUNTER — Other Ambulatory Visit (INDEPENDENT_AMBULATORY_CARE_PROVIDER_SITE_OTHER): Payer: BC Managed Care – PPO

## 2013-08-03 VITALS — BP 140/82 | HR 80 | Temp 98.3°F | Resp 16 | Wt 257.0 lb

## 2013-08-03 DIAGNOSIS — M545 Low back pain, unspecified: Secondary | ICD-10-CM

## 2013-08-03 DIAGNOSIS — I10 Essential (primary) hypertension: Secondary | ICD-10-CM

## 2013-08-03 DIAGNOSIS — R609 Edema, unspecified: Secondary | ICD-10-CM

## 2013-08-03 DIAGNOSIS — E119 Type 2 diabetes mellitus without complications: Secondary | ICD-10-CM

## 2013-08-03 LAB — HEMOGLOBIN A1C: Hgb A1c MFr Bld: 8.6 % — ABNORMAL HIGH (ref 4.6–6.5)

## 2013-08-03 LAB — BASIC METABOLIC PANEL
CO2: 29 mEq/L (ref 19–32)
Chloride: 100 mEq/L (ref 96–112)
Creatinine, Ser: 0.9 mg/dL (ref 0.4–1.5)
GFR: 95.3 mL/min (ref >60.00)
Potassium: 4.5 mEq/L (ref 3.5–5.1)
Sodium: 136 mEq/L (ref 135–145)

## 2013-08-03 MED ORDER — MORPHINE SULFATE ER 100 MG PO TBCR: 200.0000 mg | EXTENDED_RELEASE_TABLET | Freq: Three times a day (TID) | ORAL | Status: DC

## 2013-08-03 MED ORDER — MORPHINE SULFATE 15 MG PO TABS: 15.0000 mg | ORAL_TABLET | Freq: Two times a day (BID) | ORAL | Status: DC | PRN

## 2013-08-03 NOTE — Assessment & Plan Note (Signed)
Continue with current prescription therapy as reflected on the Med list.  

## 2013-08-03 NOTE — Progress Notes (Signed)
Patient ID: [name] [name], male   DOB: 06/14/1966, 47 y.o.   MRN: [id number]   Subjective:    HPI  The patient presents for a follow-up of  chronic LBP, hypertension, chronic dyslipidemia, type 2 diabetes controlled with medicines. Not using testosterone... He is trying to cut back on cigs -- 11/2 ppd and blue e-cig, not doing too well yet..   Wt Readings from Last 3 Encounters:  08/03/13 257 lb (116.574 kg)  05/04/13 255 lb (115.667 kg)  02/02/13 249 lb (112.946 kg)   BP Readings from Last 3 Encounters:  08/03/13 140/82  05/04/13 124/72  02/02/13 150/92      Review of Systems  Constitutional: Negative for appetite change, fatigue and unexpected weight change.  HENT: Positive for congestion and postnasal drip. Negative for nosebleeds, sneezing, sore throat and trouble swallowing.   Eyes: Negative for itching and visual disturbance.  Respiratory: Positive for cough.   Cardiovascular: Negative for chest pain, palpitations and leg swelling.  Gastrointestinal: Negative for nausea, diarrhea, blood in stool and abdominal distention.  Genitourinary: Negative for frequency and hematuria.  Musculoskeletal: Positive for back pain. Negative for gait problem, joint swelling and neck pain.  Skin: Negative for rash.  Neurological: Negative for dizziness, tremors, speech difficulty and weakness.  Psychiatric/Behavioral: Negative for sleep disturbance, dysphoric mood and agitation. The patient is not nervous/anxious.        Objective:   Physical Exam  Constitutional: He is oriented to person, place, and time. He appears well-developed.  HENT:  eryth throat  Eyes: Conjunctivae are normal. Pupils are equal, round, and reactive to light.  Neck: Normal range of motion. No JVD present. No thyromegaly present.  Cardiovascular: Normal rate, regular rhythm, normal heart sounds and intact distal pulses.  Exam reveals no gallop and no friction rub.   No murmur heard. Pulmonary/Chest: Effort  normal. No respiratory distress. He has wheezes. He has no rales. He exhibits no tenderness.  Abdominal: Soft. Bowel sounds are normal. He exhibits no distension and no mass. There is no tenderness. There is no rebound and no guarding.  Musculoskeletal: Normal range of motion. He exhibits tenderness (ls is tender). He exhibits no edema.  Lymphadenopathy:    He has no cervical adenopathy.  Neurological: He is alert and oriented to person, place, and time. He has normal reflexes. No cranial nerve deficit. He exhibits normal muscle tone. Coordination normal.  Skin: Skin is warm and dry. No rash noted.  Psychiatric: He has a normal mood and affect. His behavior is normal. Judgment and thought content normal.   Lab Results  Component Value Date   WBC 13.8* 10/31/2011   HGB 15.0 10/31/2011   HCT 44.3 10/31/2011   PLT 231.0 10/31/2011   GLUCOSE 207* 02/02/2013   CHOL 125 02/02/2013   TRIG 63.0 02/02/2013   HDL 34.30* 02/02/2013   LDLDIRECT 116.6 05/20/2007   LDLCALC 78 02/02/2013   ALT 27 02/02/2013   AST 23 02/02/2013   NA 137 02/02/2013   K 4.2 02/02/2013   CL 104 02/02/2013   CREATININE 0.8 02/02/2013   BUN 14 02/02/2013   CO2 27 02/02/2013   TSH 3.73 06/04/2010   PSA 0.20 06/04/2010   INR 1.1 05/10/2008   HGBA1C 9.2* 02/02/2013          Assessment & Plan:

## 2013-08-03 NOTE — Progress Notes (Signed)
Pre visit review using our clinic review tool, if applicable. No additional management support is needed unless otherwise documented below in the visit note. 

## 2013-08-03 NOTE — Assessment & Plan Note (Signed)
No relapse 

## 2013-08-08 ENCOUNTER — Encounter: Payer: Self-pay | Admitting: Internal Medicine

## 2013-10-26 ENCOUNTER — Encounter: Payer: Self-pay | Admitting: Internal Medicine

## 2013-10-26 ENCOUNTER — Ambulatory Visit (INDEPENDENT_AMBULATORY_CARE_PROVIDER_SITE_OTHER): Payer: BC Managed Care – PPO

## 2013-10-26 ENCOUNTER — Ambulatory Visit (INDEPENDENT_AMBULATORY_CARE_PROVIDER_SITE_OTHER): Payer: BC Managed Care – PPO | Admitting: Internal Medicine

## 2013-10-26 VITALS — BP 130/78 | HR 80 | Temp 98.7°F | Resp 16 | Wt 259.0 lb

## 2013-10-26 DIAGNOSIS — I10 Essential (primary) hypertension: Secondary | ICD-10-CM

## 2013-10-26 DIAGNOSIS — R739 Hyperglycemia, unspecified: Secondary | ICD-10-CM

## 2013-10-26 DIAGNOSIS — R7309 Other abnormal glucose: Secondary | ICD-10-CM

## 2013-10-26 DIAGNOSIS — M545 Low back pain, unspecified: Secondary | ICD-10-CM

## 2013-10-26 DIAGNOSIS — R109 Unspecified abdominal pain: Secondary | ICD-10-CM

## 2013-10-26 DIAGNOSIS — R319 Hematuria, unspecified: Secondary | ICD-10-CM

## 2013-10-26 LAB — BASIC METABOLIC PANEL
BUN: 18 mg/dL (ref 6–23)
CO2: 27 meq/L (ref 19–32)
Calcium: 9.6 mg/dL (ref 8.4–10.5)
Chloride: 99 mEq/L (ref 96–112)
Creatinine, Ser: 1 mg/dL (ref 0.4–1.5)
GFR: 81.49 mL/min (ref >60.00)
GLUCOSE: 105 mg/dL — AB (ref 70–99)
POTASSIUM: 4.4 meq/L (ref 3.5–5.1)
Sodium: 134 mEq/L — ABNORMAL LOW (ref 135–145)

## 2013-10-26 LAB — URINALYSIS, ROUTINE W REFLEX MICROSCOPIC
Bilirubin Urine: NEGATIVE
Ketones, ur: NEGATIVE
Leukocytes, UA: NEGATIVE
NITRITE: NEGATIVE
PH: 6 (ref 5.0–8.0)
Specific Gravity, Urine: 1.015 (ref 1.000–1.030)
TOTAL PROTEIN, URINE-UPE24: NEGATIVE
Urine Glucose: NEGATIVE
Urobilinogen, UA: 0.2 (ref 0.0–1.0)
WBC UA: NONE SEEN (ref 0–?)

## 2013-10-26 LAB — HEPATIC FUNCTION PANEL
ALT: 27 U/L (ref 0–53)
AST: 26 U/L (ref 0–37)
Albumin: 4 g/dL (ref 3.5–5.2)
Alkaline Phosphatase: 78 U/L (ref 39–117)
BILIRUBIN DIRECT: 0 mg/dL (ref 0.0–0.3)
BILIRUBIN TOTAL: 0.4 mg/dL (ref 0.3–1.2)
Total Protein: 7.8 g/dL (ref 6.0–8.3)

## 2013-10-26 LAB — HEMOGLOBIN A1C: Hgb A1c MFr Bld: 9.6 % — ABNORMAL HIGH (ref 4.6–6.5)

## 2013-10-26 MED ORDER — MORPHINE SULFATE 15 MG PO TABS: 15.0000 mg | ORAL_TABLET | Freq: Two times a day (BID) | ORAL | Status: DC | PRN

## 2013-10-26 MED ORDER — MORPHINE SULFATE ER 100 MG PO TBCR: 200.0000 mg | EXTENDED_RELEASE_TABLET | Freq: Three times a day (TID) | ORAL | Status: DC

## 2013-10-26 MED ORDER — CIPROFLOXACIN HCL 500 MG PO TABS: 500.0000 mg | ORAL_TABLET | Freq: Two times a day (BID) | ORAL | Status: DC

## 2013-10-26 MED ORDER — KETOROLAC TROMETHAMINE 10 MG PO TABS: 10.0000 mg | ORAL_TABLET | Freq: Three times a day (TID) | ORAL | Status: DC | PRN

## 2013-10-26 NOTE — Progress Notes (Signed)
   Subjective:    HPI  C/o severe L LBP 7/10 since Sat and blood in urine off and on; he had fever and chills  The patient presents for a follow-up of  chronic LBP, hypertension, chronic dyslipidemia, type 2 diabetes controlled with medicines. Not using testosterone... He is trying to cut back on cigs -- 11/2 ppd and blue e-cig, not doing too well yet..   Wt Readings from Last 3 Encounters:  10/26/13 259 lb (117.482 kg)  08/03/13 257 lb (116.574 kg)  05/04/13 255 lb (115.667 kg)   BP Readings from Last 3 Encounters:  10/26/13 130/78  08/03/13 140/82  05/04/13 124/72      Review of Systems  Constitutional: Negative for appetite change, fatigue and unexpected weight change.  HENT: Positive for congestion and postnasal drip. Negative for nosebleeds, sneezing, sore throat and trouble swallowing.   Eyes: Negative for itching and visual disturbance.  Respiratory: Positive for cough.   Cardiovascular: Negative for chest pain, palpitations and leg swelling.  Gastrointestinal: Negative for nausea, diarrhea, blood in stool and abdominal distention.  Genitourinary: Negative for frequency and hematuria.  Musculoskeletal: Positive for back pain. Negative for gait problem, joint swelling and neck pain.  Skin: Negative for rash.  Neurological: Negative for dizziness, tremors, speech difficulty and weakness.  Psychiatric/Behavioral: Negative for sleep disturbance, dysphoric mood and agitation. The patient is not nervous/anxious.        Objective:   Physical Exam  Constitutional: He is oriented to person, place, and time. He appears well-developed.  HENT:  eryth throat  Eyes: Conjunctivae are normal. Pupils are equal, round, and reactive to light.  Neck: Normal range of motion. No JVD present. No thyromegaly present.  Cardiovascular: Normal rate, regular rhythm, normal heart sounds and intact distal pulses.  Exam reveals no gallop and no friction rub.   No murmur  heard. Pulmonary/Chest: Effort normal. No respiratory distress. He has wheezes. He has no rales. He exhibits no tenderness.  Abdominal: Soft. Bowel sounds are normal. He exhibits no distension and no mass. There is no tenderness. There is no rebound and no guarding.  Musculoskeletal: Normal range of motion. He exhibits tenderness (ls is tender). He exhibits no edema.  Lymphadenopathy:    He has no cervical adenopathy.  Neurological: He is alert and oriented to person, place, and time. He has normal reflexes. No cranial nerve deficit. He exhibits normal muscle tone. Coordination normal.  Skin: Skin is warm and dry. No rash noted.  Psychiatric: He has a normal mood and affect. His behavior is normal. Judgment and thought content normal.   Lab Results  Component Value Date   WBC 13.8* 10/31/2011   HGB 15.0 10/31/2011   HCT 44.3 10/31/2011   PLT 231.0 10/31/2011   GLUCOSE 172* 08/03/2013   CHOL 125 02/02/2013   TRIG 63.0 02/02/2013   HDL 34.30* 02/02/2013   LDLDIRECT 116.6 05/20/2007   LDLCALC 78 02/02/2013   ALT 27 02/02/2013   AST 23 02/02/2013   NA 136 08/03/2013   K 4.5 08/03/2013   CL 100 08/03/2013   CREATININE 0.9 08/03/2013   BUN 13 08/03/2013   CO2 29 08/03/2013   TSH 3.73 06/04/2010   PSA 0.20 06/04/2010   INR 1.1 05/10/2008   HGBA1C 8.6* 08/03/2013          Assessment & Plan:

## 2013-10-26 NOTE — Assessment & Plan Note (Signed)
2/15 acute -- ?renal colic CT Dr Annabell HowellsWrenn

## 2013-10-26 NOTE — Patient Instructions (Signed)
Go to ER if worse 

## 2013-10-26 NOTE — Assessment & Plan Note (Signed)
2/15 acute -- ?renal colic Labs Cipro Dr Annabell HowellsWrenn Toradol prn

## 2013-10-26 NOTE — Assessment & Plan Note (Signed)
Continue with current prescription therapy as reflected on the Med list.  

## 2013-10-26 NOTE — Progress Notes (Signed)
Pre visit review using our clinic review tool, if applicable. No additional management support is needed unless otherwise documented below in the visit note. 

## 2013-10-28 ENCOUNTER — Telehealth: Payer: Self-pay | Admitting: Internal Medicine

## 2013-10-28 LAB — CBC WITH DIFFERENTIAL/PLATELET
BASOS ABS: 0.1 10*3/uL (ref 0.0–0.1)
Basophils Relative: 0.5 % (ref 0.0–3.0)
EOS PCT: 1.8 % (ref 0.0–5.0)
Eosinophils Absolute: 0.3 10*3/uL (ref 0.0–0.7)
HCT: 42.5 % (ref 39.0–52.0)
Hemoglobin: 14.1 g/dL (ref 13.0–17.0)
Lymphocytes Relative: 16 % (ref 12.0–46.0)
Lymphs Abs: 2.5 10*3/uL (ref 0.7–4.0)
MCHC: 33.1 g/dL (ref 30.0–36.0)
MCV: 90.5 fl (ref 78.0–100.0)
Monocytes Absolute: 1 10*3/uL (ref 0.1–1.0)
Monocytes Relative: 6.3 % (ref 3.0–12.0)
NEUTROS PCT: 75.4 % (ref 43.0–77.0)
Neutro Abs: 11.9 10*3/uL — ABNORMAL HIGH (ref 1.4–7.7)
PLATELETS: 263 10*3/uL (ref 150.0–400.0)
RBC: 4.7 Mil/uL (ref 4.22–5.81)
RDW: 13 % (ref 11.5–14.6)
WBC: 15.8 10*3/uL — AB (ref 4.5–10.5)

## 2013-10-28 LAB — SEDIMENTATION RATE: Sed Rate: 17 mm/hr (ref 0–22)

## 2013-10-28 NOTE — Telephone Encounter (Signed)
Relevant patient education mailed to patient.  

## 2013-11-02 ENCOUNTER — Ambulatory Visit: Payer: BC Managed Care – PPO | Admitting: Internal Medicine

## 2013-11-02 DIAGNOSIS — Z0289 Encounter for other administrative examinations: Secondary | ICD-10-CM

## 2013-11-03 ENCOUNTER — Other Ambulatory Visit: Payer: Self-pay | Admitting: Internal Medicine

## 2013-11-23 ENCOUNTER — Other Ambulatory Visit: Payer: Self-pay | Admitting: Internal Medicine

## 2013-11-25 ENCOUNTER — Other Ambulatory Visit: Payer: Self-pay

## 2013-11-25 MED ORDER — INSULIN GLARGINE 100 UNIT/ML ~~LOC~~ SOLN: 140.0000 [IU] | Freq: Every day | SUBCUTANEOUS | Status: DC

## 2014-01-31 ENCOUNTER — Encounter: Payer: Self-pay | Admitting: Internal Medicine

## 2014-01-31 ENCOUNTER — Ambulatory Visit (INDEPENDENT_AMBULATORY_CARE_PROVIDER_SITE_OTHER): Payer: BC Managed Care – PPO | Admitting: Internal Medicine

## 2014-01-31 VITALS — BP 100/64 | HR 76 | Temp 98.2°F | Resp 16 | Wt 260.0 lb

## 2014-01-31 DIAGNOSIS — E119 Type 2 diabetes mellitus without complications: Secondary | ICD-10-CM

## 2014-01-31 DIAGNOSIS — M545 Low back pain, unspecified: Secondary | ICD-10-CM

## 2014-01-31 MED ORDER — MORPHINE SULFATE 15 MG PO TABS: 15.0000 mg | ORAL_TABLET | Freq: Two times a day (BID) | ORAL | Status: DC | PRN

## 2014-01-31 MED ORDER — MORPHINE SULFATE ER 100 MG PO TBCR: 200.0000 mg | EXTENDED_RELEASE_TABLET | Freq: Three times a day (TID) | ORAL | Status: DC

## 2014-01-31 NOTE — Progress Notes (Signed)
   Subjective:    HPI  The patient presents for a follow-up of  chronic LBP, hypertension, chronic dyslipidemia, type 2 diabetes controlled with medicines. Not using testosterone... He is trying to cut back on cigs -- 11/2 ppd and blue e-cig, not doing too well yet..   Wt Readings from Last 3 Encounters:  01/31/14 260 lb (117.935 kg)  10/26/13 259 lb (117.482 kg)  08/03/13 257 lb (116.574 kg)   BP Readings from Last 3 Encounters:  01/31/14 100/64  10/26/13 130/78  08/03/13 140/82      Review of Systems  Constitutional: Negative for appetite change, fatigue and unexpected weight change.  HENT: Positive for congestion and postnasal drip. Negative for nosebleeds, sneezing, sore throat and trouble swallowing.   Eyes: Negative for itching and visual disturbance.  Respiratory: Positive for cough.   Cardiovascular: Negative for chest pain, palpitations and leg swelling.  Gastrointestinal: Negative for nausea, diarrhea, blood in stool and abdominal distention.  Genitourinary: Negative for frequency and hematuria.  Musculoskeletal: Positive for back pain. Negative for gait problem, joint swelling and neck pain.  Skin: Negative for rash.  Neurological: Negative for dizziness, tremors, speech difficulty and weakness.  Psychiatric/Behavioral: Negative for sleep disturbance, dysphoric mood and agitation. The patient is not nervous/anxious.        Objective:   Physical Exam  Constitutional: He is oriented to person, place, and time. He appears well-developed.  HENT:  eryth throat  Eyes: Conjunctivae are normal. Pupils are equal, round, and reactive to light.  Neck: Normal range of motion. No JVD present. No thyromegaly present.  Cardiovascular: Normal rate, regular rhythm, normal heart sounds and intact distal pulses.  Exam reveals no gallop and no friction rub.   No murmur heard. Pulmonary/Chest: Effort normal. No respiratory distress. He has wheezes. He has no rales. He exhibits no  tenderness.  Abdominal: Soft. Bowel sounds are normal. He exhibits no distension and no mass. There is no tenderness. There is no rebound and no guarding.  Musculoskeletal: Normal range of motion. He exhibits tenderness (ls is tender). He exhibits no edema.  Lymphadenopathy:    He has no cervical adenopathy.  Neurological: He is alert and oriented to person, place, and time. He has normal reflexes. No cranial nerve deficit. He exhibits normal muscle tone. Coordination normal.  Skin: Skin is warm and dry. No rash noted.  Psychiatric: He has a normal mood and affect. His behavior is normal. Judgment and thought content normal.   Lab Results  Component Value Date   WBC 15.8* 10/26/2013   HGB 14.1 10/26/2013   HCT 42.5 10/26/2013   PLT 263.0 10/26/2013   GLUCOSE 105* 10/26/2013   CHOL 125 02/02/2013   TRIG 63.0 02/02/2013   HDL 34.30* 02/02/2013   LDLDIRECT 116.6 05/20/2007   LDLCALC 78 02/02/2013   ALT 27 10/26/2013   AST 26 10/26/2013   NA 134* 10/26/2013   K 4.4 10/26/2013   CL 99 10/26/2013   CREATININE 1.0 10/26/2013   BUN 18 10/26/2013   CO2 27 10/26/2013   TSH 3.73 06/04/2010   PSA 0.20 06/04/2010   INR 1.1 05/10/2008   HGBA1C 9.6* 10/26/2013          Assessment & Plan:

## 2014-01-31 NOTE — Progress Notes (Signed)
Pre visit review using our clinic review tool, if applicable. No additional management support is needed unless otherwise documented below in the visit note. 

## 2014-01-31 NOTE — Assessment & Plan Note (Signed)
Continue with current prescription therapy as reflected on the Med list.  

## 2014-01-31 NOTE — Assessment & Plan Note (Signed)
Continue with current prescription therapy as reflected on the Med list. labs 

## 2014-03-06 ENCOUNTER — Telehealth: Payer: Self-pay

## 2014-03-06 DIAGNOSIS — E119 Type 2 diabetes mellitus without complications: Secondary | ICD-10-CM

## 2014-03-06 NOTE — Telephone Encounter (Signed)
Diabetic bundle- lipid,a1c and bmet ordered 

## 2014-03-28 ENCOUNTER — Other Ambulatory Visit: Payer: Self-pay | Admitting: Endocrinology

## 2014-05-03 ENCOUNTER — Ambulatory Visit (INDEPENDENT_AMBULATORY_CARE_PROVIDER_SITE_OTHER): Payer: BC Managed Care – PPO | Admitting: Internal Medicine

## 2014-05-03 ENCOUNTER — Encounter: Payer: Self-pay | Admitting: Internal Medicine

## 2014-05-03 VITALS — BP 110/62 | HR 59 | Temp 98.3°F | Wt 257.0 lb

## 2014-05-03 DIAGNOSIS — M545 Low back pain, unspecified: Secondary | ICD-10-CM

## 2014-05-03 DIAGNOSIS — I10 Essential (primary) hypertension: Secondary | ICD-10-CM

## 2014-05-03 DIAGNOSIS — E119 Type 2 diabetes mellitus without complications: Secondary | ICD-10-CM

## 2014-05-03 DIAGNOSIS — F3289 Other specified depressive episodes: Secondary | ICD-10-CM

## 2014-05-03 DIAGNOSIS — F329 Major depressive disorder, single episode, unspecified: Secondary | ICD-10-CM

## 2014-05-03 MED ORDER — MORPHINE SULFATE 15 MG PO TABS: 15.0000 mg | ORAL_TABLET | Freq: Two times a day (BID) | ORAL | Status: DC | PRN

## 2014-05-03 MED ORDER — MORPHINE SULFATE ER 100 MG PO TBCR: 200.0000 mg | EXTENDED_RELEASE_TABLET | Freq: Three times a day (TID) | ORAL | Status: DC

## 2014-05-03 NOTE — Assessment & Plan Note (Signed)
Continue with current prescription therapy as reflected on the Med list.  

## 2014-05-03 NOTE — Progress Notes (Signed)
   Subjective:    HPI  The patient presents for a follow-up of  chronic LBP, hypertension, chronic dyslipidemia, type 2 diabetes controlled with medicines. Not using testosterone... He is trying to cut back on cigs -- 11/2 ppd and blue e-cig, not doing too well yet..   Wt Readings from Last 3 Encounters:  05/03/14 257 lb (116.574 kg)  01/31/14 260 lb (117.935 kg)  10/26/13 259 lb (117.482 kg)   BP Readings from Last 3 Encounters:  05/03/14 110/62  01/31/14 100/64  10/26/13 130/78      Review of Systems  Constitutional: Negative for appetite change, fatigue and unexpected weight change.  HENT: Positive for congestion and postnasal drip. Negative for nosebleeds, sneezing, sore throat and trouble swallowing.   Eyes: Negative for itching and visual disturbance.  Respiratory: Positive for cough.   Cardiovascular: Negative for chest pain, palpitations and leg swelling.  Gastrointestinal: Negative for nausea, diarrhea, blood in stool and abdominal distention.  Genitourinary: Negative for frequency and hematuria.  Musculoskeletal: Positive for back pain. Negative for gait problem, joint swelling and neck pain.  Skin: Negative for rash.  Neurological: Negative for dizziness, tremors, speech difficulty and weakness.  Psychiatric/Behavioral: Negative for sleep disturbance, dysphoric mood and agitation. The patient is not nervous/anxious.        Objective:   Physical Exam  Constitutional: He is oriented to person, place, and time. He appears well-developed.  HENT:  eryth throat  Eyes: Conjunctivae are normal. Pupils are equal, round, and reactive to light.  Neck: Normal range of motion. No JVD present. No thyromegaly present.  Cardiovascular: Normal rate, regular rhythm, normal heart sounds and intact distal pulses.  Exam reveals no gallop and no friction rub.   No murmur heard. Pulmonary/Chest: Effort normal. No respiratory distress. He has wheezes. He has no rales. He exhibits no  tenderness.  Abdominal: Soft. Bowel sounds are normal. He exhibits no distension and no mass. There is no tenderness. There is no rebound and no guarding.  Musculoskeletal: Normal range of motion. He exhibits tenderness (ls is tender). He exhibits no edema.  Lymphadenopathy:    He has no cervical adenopathy.  Neurological: He is alert and oriented to person, place, and time. He has normal reflexes. No cranial nerve deficit. He exhibits normal muscle tone. Coordination normal.  Skin: Skin is warm and dry. No rash noted.  Psychiatric: He has a normal mood and affect. His behavior is normal. Judgment and thought content normal.   Lab Results  Component Value Date   WBC 15.8* 10/26/2013   HGB 14.1 10/26/2013   HCT 42.5 10/26/2013   PLT 263.0 10/26/2013   GLUCOSE 105* 10/26/2013   CHOL 125 02/02/2013   TRIG 63.0 02/02/2013   HDL 34.30* 02/02/2013   LDLDIRECT 116.6 05/20/2007   LDLCALC 78 02/02/2013   ALT 27 10/26/2013   AST 26 10/26/2013   NA 134* 10/26/2013   K 4.4 10/26/2013   CL 99 10/26/2013   CREATININE 1.0 10/26/2013   BUN 18 10/26/2013   CO2 27 10/26/2013   TSH 3.73 06/04/2010   PSA 0.20 06/04/2010   INR 1.1 05/10/2008   HGBA1C 9.6* 10/26/2013          Assessment & Plan:

## 2014-05-03 NOTE — Assessment & Plan Note (Signed)
Chronic severe LBP, on opioids  Potential benefits of a long term opioids  use as well as potential risks  and complications were explained to the patient and were aknowledged. Continue with current prescription therapy as reflected on the Med list.

## 2014-05-03 NOTE — Progress Notes (Signed)
Pre visit review using our clinic review tool, if applicable. No additional management support is needed unless otherwise documented below in the visit note. 

## 2014-06-08 ENCOUNTER — Other Ambulatory Visit: Payer: Self-pay | Admitting: Internal Medicine

## 2014-06-13 ENCOUNTER — Other Ambulatory Visit: Payer: Self-pay | Admitting: Endocrinology

## 2014-06-13 NOTE — Telephone Encounter (Signed)
Please advise if ok to refill rx. Pt has not been seen since 09/14/2012. Thanks!

## 2014-06-13 NOTE — Telephone Encounter (Signed)
Rx sent to pharmacy   

## 2014-06-13 NOTE — Telephone Encounter (Signed)
Please refill x 1 Ov is due  

## 2014-06-14 ENCOUNTER — Ambulatory Visit (INDEPENDENT_AMBULATORY_CARE_PROVIDER_SITE_OTHER): Payer: BC Managed Care – PPO | Admitting: Endocrinology

## 2014-06-14 ENCOUNTER — Encounter: Payer: Self-pay | Admitting: Endocrinology

## 2014-06-14 VITALS — BP 122/68 | HR 70 | Temp 98.1°F | Ht 70.0 in | Wt 250.0 lb

## 2014-06-14 DIAGNOSIS — E119 Type 2 diabetes mellitus without complications: Secondary | ICD-10-CM

## 2014-06-14 NOTE — Progress Notes (Signed)
Subjective:    Patient ID: [name] [name], male    DOB: March 07, 1966, 48 y.o.   MRN: [id number]  HPI Pt returns for f/u of diabetes mellitus:  DM type: Insulin-requiring type 2 Dx'ed: 2005 Complications: none Therapy: insulin since 2007 DKA: never Severe hypoglycemia: never Pancreatitis: never.   Other: he takes qd insulin, due to h/o noncompliance.   Interval history:  Pt says he never misses the lantus.  no cbg record, but states cbg's are sometimes low in the afternoon, with activity. Past Medical History  Diagnosis Date  . Diabetes mellitus   . Hyperlipidemia   . Hypertension   . GERD (gastroesophageal reflux disease)   . Lower back pain   . Nephrolithiasis     Dr Annabell HowellsWrenn    No past surgical history on file.  History   Social History  . Marital Status: Married    Spouse Name: N/A    Number of Children: N/A  . Years of Education: N/A   Occupational History  . Not on file.   Social History Main Topics  . Smoking status: Current Every Day Smoker -- 2.00 packs/day    Types: Cigarettes  . Smokeless tobacco: Not on file  . Alcohol Use: No  . Drug Use: No  . Sexual Activity: Yes   Other Topics Concern  . Not on file   Social History Narrative   No regular exercise    Current Outpatient Prescriptions on File Prior to Visit  Medication Sig Dispense Refill  . amLODipine-olmesartan (AZOR) 10-40 MG per tablet Take 1 tablet by mouth daily.  30 tablet  11  . aspirin 325 MG tablet Take 325 mg by mouth daily.        . B-D UF III MINI PEN NEEDLES 31G X 5 MM MISC USE AS DIRECTED  100 each  3  . Cholecalciferol (VITAMIN D3) 1000 UNITS CAPS Take 1 capsule by mouth daily.        . famciclovir (FAMVIR) 500 MG tablet Take 500 mg by mouth 3 (three) times daily.        . fluticasone (FLONASE) 50 MCG/ACT nasal spray Place 2 sprays into the nose daily.  42 g  3  . ketorolac (TORADOL) 10 MG tablet Take 1 tablet (10 mg total) by mouth every 8 (eight) hours as needed for severe  pain (renal colic).  20 tablet  0  . Lorcaserin HCl (BELVIQ) 10 MG TABS Take 1 tablet by mouth 2 (two) times daily.  60 tablet  3  . lovastatin (MEVACOR) 20 MG tablet TAKE 1 TABLET BY MOUTH EVERY DAY  30 tablet  5  . morphine (MS CONTIN) 100 MG 12 hr tablet Take 2 tablets (200 mg total) by mouth 3 (three) times daily. Please fill on or after 07/06/14  180 tablet  0  . morphine (MSIR) 15 MG tablet Take 1 tablet (15 mg total) by mouth 2 (two) times daily as needed for severe pain. For breakthrough pain  Fill on or after 07/06/14  60 tablet  0  . nebivolol (BYSTOLIC) 10 MG tablet Take 10 mg by mouth daily.      . ONE TOUCH ULTRA TEST test strip USE TWICE DAILY AS INSTRUCTED  100 each  3  . ONETOUCH DELICA LANCETS 33G MISC USE AS DIRECTED. PLEASE DISPENSE ONETOUCH DELICA LANCETS.  100 each  1  . promethazine (PHENERGAN) 25 MG tablet Take 1 tablet (25 mg total) by mouth every 6 (six) hours as needed.  30 tablet  1  . tadalafil (CIALIS) 20 MG tablet Take 20 mg by mouth. 1/2 to 1 tablet by mouth every 1-3 days       . Testosterone Micronized CRYS 1 ml on skin q am  30 g  5  . glucose blood test strip Use as instructed       No current facility-administered medications on file prior to visit.    Allergies  Allergen Reactions  . Benazepril Hcl     REACTION: cough  . Nsaids     Can take short term Upset stomach    Family History  Problem Relation Age of Onset  . Heart disease Other     CAD male relative <60  . Hypertension Mother   . Diabetes Mother   . Vision loss Sister     BP 122/68  Pulse 70  Temp(Src) 98.1 F (36.7 C) (Oral)  Ht 5\' 10"  (1.778 m)  Wt 250 lb (113.399 kg)  BMI 35.87 kg/m2  SpO2 94%    Review of Systems Denies LOC and weight change.      Objective:   Physical Exam VITAL SIGNS:  See vs page GENERAL: no distress Pulses: dorsalis pedis intact bilat.   Feet: no deformity.  Trace bilat leg edema.   Skin:  no ulcer on the feet.  normal color and temp. Neuro:  sensation is intact to touch on the feet  Lab Results  Component Value Date   HGBA1C 8.4* 06/14/2014       Assessment & Plan:  DM: moderate exacerbation Dyslipidemia: well-controlled Noncompliance with cbg recording and f/u ov's: I'll work around this as best I can   Patient is advised the following: Patient Instructions  good diet and exercise habits significanly improve the control of your diabetes.  please let me know if you wish to be referred to a dietician.  high blood sugar is very risky to your health.  you should see an eye doctor and dentist every year.  It is very important to get all recommended vaccinations.  controlling your blood pressure and cholesterol drastically reduces the damage diabetes does to your body.  this also applies to quitting smoking.  please discuss these with your doctor.  check your blood sugar twice a day.  vary the time of day when you check, between before the 3 meals, and at bedtime.  also check if you have symptoms of your blood sugar being too high or too low.  please keep a record of the readings and bring it to your next appointment here.  You can write it on any piece of paper.  please call us sooner if your blood sugar goes below 70, or if you have a lot of readings over 200. blood and urine tests are being requested for you today.  We'll contact you with results.   Based on the results, i'll prescribe for you a different amount of insulin for days when you are active.   Please come back for a follow-up appointment in 3 months.     addendum: increase lantus to 160 units qam. However, take just 100 units if you are going to be active

## 2014-06-14 NOTE — Patient Instructions (Addendum)
good diet and exercise habits significanly improve the control of your diabetes.  please let me know if you wish to be referred to a dietician.  high blood sugar is very risky to your health.  you should see an eye doctor and dentist every year.  It is very important to get all recommended vaccinations.  controlling your blood pressure and cholesterol drastically reduces the damage diabetes does to your body.  this also applies to quitting smoking.  please discuss these with your doctor.  check your blood sugar twice a day.  vary the time of day when you check, between before the 3 meals, and at bedtime.  also check if you have symptoms of your blood sugar being too high or too low.  please keep a record of the readings and bring it to your next appointment here.  You can write it on any piece of paper.  please call us sooner if your blood sugar goes below 70, or if you have a lot of readings over 200. blood and urine tests are being requested for you today.  We'll contact you with results.   Based on the results, i'll prescribe for you a different amount of insulin for days when you are active.   Please come back for a follow-up appointment in 3 months.

## 2014-06-15 LAB — LIPID PANEL
CHOL/HDL RATIO: 3
Cholesterol: 113 mg/dL (ref 0–200)
HDL: 33.6 mg/dL — ABNORMAL LOW (ref >39.00)
LDL Cholesterol: 67 mg/dL (ref 0–99)
NonHDL: 79.4
Triglycerides: 64 mg/dL (ref 0.0–149.0)
VLDL: 12.8 mg/dL (ref 0.0–40.0)

## 2014-06-15 LAB — HEMOGLOBIN A1C: Hgb A1c MFr Bld: 8.4 % — ABNORMAL HIGH (ref 4.6–6.5)

## 2014-06-15 LAB — MICROALBUMIN / CREATININE URINE RATIO
Creatinine,U: 180.4 mg/dL
Microalb Creat Ratio: 0.7 mg/g (ref 0.0–30.0)
Microalb, Ur: 1.2 mg/dL (ref 0.0–1.9)

## 2014-06-15 MED ORDER — INSULIN GLARGINE 100 UNIT/ML SOLOSTAR PEN: 160.0000 [IU] | PEN_INJECTOR | SUBCUTANEOUS | Status: DC

## 2014-06-26 ENCOUNTER — Other Ambulatory Visit: Payer: Self-pay | Admitting: Internal Medicine

## 2014-06-27 ENCOUNTER — Telehealth: Payer: Self-pay | Admitting: Internal Medicine

## 2014-06-27 NOTE — Telephone Encounter (Signed)
Pt wife called in said that pt is not feeling well.  It for acute visit.  She dont want him to see anyone else.  She wanted to know if Dr Macario Goldsplot could work him in this week????

## 2014-06-28 NOTE — Telephone Encounter (Signed)
Addressed Thx 

## 2014-06-29 ENCOUNTER — Ambulatory Visit (INDEPENDENT_AMBULATORY_CARE_PROVIDER_SITE_OTHER)
Admission: RE | Admit: 2014-06-29 | Discharge: 2014-06-29 | Disposition: A | Discharge status: Home / Self Care | Payer: BC Managed Care – PPO | Source: Ambulatory Visit | Attending: Internal Medicine | Admitting: Internal Medicine

## 2014-06-29 ENCOUNTER — Encounter: Payer: Self-pay | Admitting: Internal Medicine

## 2014-06-29 ENCOUNTER — Ambulatory Visit (INDEPENDENT_AMBULATORY_CARE_PROVIDER_SITE_OTHER): Payer: BC Managed Care – PPO | Admitting: Internal Medicine

## 2014-06-29 VITALS — BP 110/60 | HR 59 | Temp 98.2°F | Wt 245.0 lb

## 2014-06-29 DIAGNOSIS — J441 Chronic obstructive pulmonary disease with (acute) exacerbation: Secondary | ICD-10-CM

## 2014-06-29 MED ORDER — PROMETHAZINE HCL 25 MG PO TABS: 25.0000 mg | ORAL_TABLET | Freq: Four times a day (QID) | ORAL | Status: DC | PRN

## 2014-06-29 MED ORDER — LEVOFLOXACIN 500 MG PO TABS: 500.0000 mg | ORAL_TABLET | Freq: Every day | ORAL | Status: DC

## 2014-06-29 MED ORDER — UMECLIDINIUM-VILANTEROL 62.5-25 MCG/INH IN AEPB: 1.0000 | INHALATION_SPRAY | Freq: Every day | RESPIRATORY_TRACT | Status: DC

## 2014-06-29 MED ORDER — FLUTICASONE PROPIONATE 50 MCG/ACT NA SUSP: 2.0000 | Freq: Every day | NASAL | Status: DC

## 2014-06-29 MED ORDER — METHYLPREDNISOLONE ACETATE 80 MG/ML IJ SUSP
80.0000 mg | Freq: Once | INTRAMUSCULAR | Status: AC
  Administered 2014-06-29: 80 mg via INTRAMUSCULAR

## 2014-06-29 NOTE — Progress Notes (Signed)
Pre visit review using our clinic review tool, if applicable. No additional management support is needed unless otherwise documented below in the visit note. 

## 2014-06-29 NOTE — Assessment & Plan Note (Signed)
Start Anoro Levaquin

## 2014-06-29 NOTE — Progress Notes (Signed)
   Subjective:    HPI  The patient presents for a follow-up of  Chronic LBP, hypertension, chronic dyslipidemia, type 2 diabetes controlled with medicines. Not using testosterone... He is trying to cut back on cigs -- 2 ppd - not doing too well...  C/o URI sx's x 3-4 wks - yellow d/c and cough+wheezing, ST  Wt Readings from Last 3 Encounters:  06/29/14 245 lb (111.131 kg)  06/14/14 250 lb (113.399 kg)  05/03/14 257 lb (116.574 kg)   BP Readings from Last 3 Encounters:  06/29/14 110/60  06/14/14 122/68  05/03/14 110/62      Review of Systems  Constitutional: Negative for appetite change, fatigue and unexpected weight change.  HENT: Positive for congestion and postnasal drip. Negative for nosebleeds, sneezing, sore throat and trouble swallowing.   Eyes: Negative for itching and visual disturbance.  Respiratory: Positive for cough.   Cardiovascular: Negative for chest pain, palpitations and leg swelling.  Gastrointestinal: Negative for nausea, diarrhea, blood in stool and abdominal distention.  Genitourinary: Negative for frequency and hematuria.  Musculoskeletal: Positive for back pain. Negative for gait problem, joint swelling and neck pain.  Skin: Negative for rash.  Neurological: Negative for dizziness, tremors, speech difficulty and weakness.  Psychiatric/Behavioral: Negative for sleep disturbance, dysphoric mood and agitation. The patient is not nervous/anxious.        Objective:   Physical Exam  Constitutional: He is oriented to person, place, and time. He appears well-developed.  HENT:  eryth throat  Eyes: Conjunctivae are normal. Pupils are equal, round, and reactive to light.  Neck: Normal range of motion. No JVD present. No thyromegaly present.  Cardiovascular: Normal rate, regular rhythm, normal heart sounds and intact distal pulses.  Exam reveals no gallop and no friction rub.   No murmur heard. Pulmonary/Chest: Effort normal. No respiratory distress. He has  wheezes. He has no rales. He exhibits no tenderness.  Abdominal: Soft. Bowel sounds are normal. He exhibits no distension and no mass. There is no tenderness. There is no rebound and no guarding.  Musculoskeletal: Normal range of motion. He exhibits tenderness (ls is tender). He exhibits no edema.  Lymphadenopathy:    He has no cervical adenopathy.  Neurological: He is alert and oriented to person, place, and time. He has normal reflexes. No cranial nerve deficit. He exhibits normal muscle tone. Coordination normal.  Skin: Skin is warm and dry. No rash noted.  Psychiatric: He has a normal mood and affect. His behavior is normal. Judgment and thought content normal.   Lab Results  Component Value Date   WBC 15.8* 10/26/2013   HGB 14.1 10/26/2013   HCT 42.5 10/26/2013   PLT 263.0 10/26/2013   GLUCOSE 105* 10/26/2013   CHOL 113 06/14/2014   TRIG 64.0 06/14/2014   HDL 33.60* 06/14/2014   LDLDIRECT 116.6 05/20/2007   LDLCALC 67 06/14/2014   ALT 27 10/26/2013   AST 26 10/26/2013   NA 134* 10/26/2013   K 4.4 10/26/2013   CL 99 10/26/2013   CREATININE 1.0 10/26/2013   BUN 18 10/26/2013   CO2 27 10/26/2013   TSH 3.73 06/04/2010   PSA 0.20 06/04/2010   INR 1.1 05/10/2008   HGBA1C 8.4* 06/14/2014   MICROALBUR 1.2 06/14/2014          Assessment & Plan:

## 2014-06-30 ENCOUNTER — Telehealth: Payer: Self-pay | Admitting: Internal Medicine

## 2014-06-30 NOTE — Telephone Encounter (Signed)
emmi mailed  °

## 2014-07-05 ENCOUNTER — Other Ambulatory Visit: Payer: Self-pay | Admitting: Internal Medicine

## 2014-07-07 ENCOUNTER — Telehealth: Payer: Self-pay | Admitting: Internal Medicine

## 2014-07-07 DIAGNOSIS — I10 Essential (primary) hypertension: Secondary | ICD-10-CM

## 2014-07-07 NOTE — Telephone Encounter (Signed)
Wife calling to check on status of PA for Azor.

## 2014-07-10 NOTE — Telephone Encounter (Signed)
Wife has called back in regards.  [facility] [place] [facility] [place].  Patient has been out since Friday.  Wife would like a call back in regards.  You can reach her at [phone] Gabriel Rainwater( Debbie Carchi)

## 2014-07-11 NOTE — Telephone Encounter (Signed)
Patient is still waiting on prior authorization for meds Azor 10-42. Please advise

## 2014-07-12 NOTE — Telephone Encounter (Signed)
Pls PA Thx

## 2014-07-12 NOTE — Telephone Encounter (Signed)
Pt's wife is calling again becoming increasingly upset he has not had BP since Friday 11/6. See previous notes.

## 2014-07-13 DIAGNOSIS — I10 Essential (primary) hypertension: Secondary | ICD-10-CM | POA: Insufficient documentation

## 2014-07-13 NOTE — Telephone Encounter (Signed)
PA form received/completed/pending MD sig. PA form faxed. Pt's wife informed.

## 2014-07-13 NOTE — Telephone Encounter (Signed)
Called PA

## 2014-07-18 ENCOUNTER — Telehealth: Payer: Self-pay | Admitting: *Deleted

## 2014-07-18 MED ORDER — AMLODIPINE BESYLATE-VALSARTAN 10-320 MG PO TABS: 1.0000 | ORAL_TABLET | Freq: Every day | ORAL | Status: DC

## 2014-07-18 NOTE — Telephone Encounter (Signed)
Ok Exforge 320-10 1 qd Thx

## 2014-07-18 NOTE — Telephone Encounter (Signed)
Azor PA is denied-- Pt must have tried/failed one of the following: Atacand, Atacand HCTZ, Avapro, Avalide, Cozaar, Hyzaar, Exforge, Exforge HCT, Micardis/HCTZ, Twynsta, Diovan HCT.   Please advise.

## 2014-07-19 NOTE — Telephone Encounter (Signed)
Notified pt wife of med change. MD has already sent to his pharmacy...Raechel Chute/lmb

## 2014-08-01 ENCOUNTER — Ambulatory Visit: Payer: BC Managed Care – PPO | Admitting: Internal Medicine

## 2014-08-01 ENCOUNTER — Ambulatory Visit (INDEPENDENT_AMBULATORY_CARE_PROVIDER_SITE_OTHER): Payer: BC Managed Care – PPO | Admitting: Internal Medicine

## 2014-08-01 ENCOUNTER — Encounter: Payer: Self-pay | Admitting: Internal Medicine

## 2014-08-01 VITALS — BP 118/70 | HR 58 | Temp 98.2°F | Wt 251.0 lb

## 2014-08-01 DIAGNOSIS — M545 Low back pain, unspecified: Secondary | ICD-10-CM

## 2014-08-01 DIAGNOSIS — K029 Dental caries, unspecified: Secondary | ICD-10-CM | POA: Insufficient documentation

## 2014-08-01 MED ORDER — MORPHINE SULFATE ER 100 MG PO TBCR: 200.0000 mg | EXTENDED_RELEASE_TABLET | Freq: Three times a day (TID) | ORAL | Status: DC

## 2014-08-01 MED ORDER — MORPHINE SULFATE 15 MG PO TABS: 15.0000 mg | ORAL_TABLET | Freq: Two times a day (BID) | ORAL | Status: DC | PRN

## 2014-08-01 NOTE — Assessment & Plan Note (Signed)
Chronic severe LBP, on opioids  Potential benefits of a long term opioids  use as well as potential risks  and complications were explained to the patient and were aknowledged.

## 2014-08-01 NOTE — Assessment & Plan Note (Addendum)
16 teeth (14+2) to be removed on 12/2 w/Versed anesthesia Pt will check w/oral surgeon re: morphine dose on the morning of surgery Control sugar levels Stop smoking

## 2014-08-01 NOTE — Progress Notes (Signed)
Pre visit review using our clinic review tool, if applicable. No additional management support is needed unless otherwise documented below in the visit note. 

## 2014-08-01 NOTE — Progress Notes (Signed)
   Subjective:    HPI  The patient presents for a follow-up of  Chronic LBP, hypertension, chronic dyslipidemia, type 2 diabetes controlled with medicines. Not using testosterone... He is trying to cut back on cigs -- 2 ppd - not doing too well...  C/o 16 teeth (14+2) to be removed on 12/2 w/Versed anesthesia  Wt Readings from Last 3 Encounters:  08/01/14 251 lb (113.853 kg)  06/29/14 245 lb (111.131 kg)  06/14/14 250 lb (113.399 kg)   BP Readings from Last 3 Encounters:  08/01/14 118/70  06/29/14 110/60  06/14/14 122/68      Review of Systems  Constitutional: Negative for appetite change, fatigue and unexpected weight change.  HENT: Positive for congestion and postnasal drip. Negative for nosebleeds, sneezing, sore throat and trouble swallowing.   Eyes: Negative for itching and visual disturbance.  Respiratory: Positive for cough.   Cardiovascular: Negative for chest pain, palpitations and leg swelling.  Gastrointestinal: Negative for nausea, diarrhea, blood in stool and abdominal distention.  Genitourinary: Negative for frequency and hematuria.  Musculoskeletal: Positive for back pain. Negative for joint swelling, gait problem and neck pain.  Skin: Negative for rash.  Neurological: Negative for dizziness, tremors, speech difficulty and weakness.  Psychiatric/Behavioral: Negative for sleep disturbance, dysphoric mood and agitation. The patient is not nervous/anxious.        Objective:   Physical Exam  Constitutional: He is oriented to person, place, and time. He appears well-developed. No distress.  NAD  HENT:  Mouth/Throat: Oropharynx is clear and moist.  Eyes: Conjunctivae are normal. Pupils are equal, round, and reactive to light.  Neck: Normal range of motion. No JVD present. No thyromegaly present.  Cardiovascular: Normal rate, regular rhythm, normal heart sounds and intact distal pulses.  Exam reveals no gallop and no friction rub.   No murmur  heard. Pulmonary/Chest: Effort normal and breath sounds normal. No respiratory distress. He has no wheezes. He has no rales. He exhibits no tenderness.  Abdominal: Soft. Bowel sounds are normal. He exhibits no distension and no mass. There is no tenderness. There is no rebound and no guarding.  Musculoskeletal: Normal range of motion. He exhibits no edema or tenderness.  Lymphadenopathy:    He has no cervical adenopathy.  Neurological: He is alert and oriented to person, place, and time. He has normal reflexes. No cranial nerve deficit. He exhibits normal muscle tone. He displays a negative Romberg sign. Coordination and gait normal.  No meningeal signs  Skin: Skin is warm and dry. No rash noted.  Psychiatric: He has a normal mood and affect. His behavior is normal. Judgment and thought content normal.  multiple caries affected teeth  Lab Results  Component Value Date   WBC 15.8* 10/26/2013   HGB 14.1 10/26/2013   HCT 42.5 10/26/2013   PLT 263.0 10/26/2013   GLUCOSE 105* 10/26/2013   CHOL 113 06/14/2014   TRIG 64.0 06/14/2014   HDL 33.60* 06/14/2014   LDLDIRECT 116.6 05/20/2007   LDLCALC 67 06/14/2014   ALT 27 10/26/2013   AST 26 10/26/2013   NA 134* 10/26/2013   K 4.4 10/26/2013   CL 99 10/26/2013   CREATININE 1.0 10/26/2013   BUN 18 10/26/2013   CO2 27 10/26/2013   TSH 3.73 06/04/2010   PSA 0.20 06/04/2010   INR 1.1 05/10/2008   HGBA1C 8.4* 06/14/2014   MICROALBUR 1.2 06/14/2014          Assessment & Plan:

## 2014-08-12 ENCOUNTER — Other Ambulatory Visit: Payer: Self-pay | Admitting: Internal Medicine

## 2014-08-14 ENCOUNTER — Other Ambulatory Visit: Payer: Self-pay

## 2014-08-14 MED ORDER — GLUCOSE BLOOD VI STRP: ORAL_STRIP | Status: DC

## 2014-10-11 ENCOUNTER — Other Ambulatory Visit: Payer: Self-pay | Admitting: *Deleted

## 2014-10-11 MED ORDER — ONETOUCH DELICA LANCETS 33G MISC: Status: DC

## 2014-10-31 ENCOUNTER — Other Ambulatory Visit (INDEPENDENT_AMBULATORY_CARE_PROVIDER_SITE_OTHER): Payer: BLUE CROSS/BLUE SHIELD

## 2014-10-31 ENCOUNTER — Encounter: Payer: Self-pay | Admitting: Internal Medicine

## 2014-10-31 ENCOUNTER — Ambulatory Visit (INDEPENDENT_AMBULATORY_CARE_PROVIDER_SITE_OTHER): Payer: BLUE CROSS/BLUE SHIELD | Admitting: Internal Medicine

## 2014-10-31 VITALS — BP 140/80 | HR 73 | Temp 98.6°F | Wt 264.2 lb

## 2014-10-31 DIAGNOSIS — E119 Type 2 diabetes mellitus without complications: Secondary | ICD-10-CM

## 2014-10-31 DIAGNOSIS — M5442 Lumbago with sciatica, left side: Secondary | ICD-10-CM

## 2014-10-31 DIAGNOSIS — E669 Obesity, unspecified: Secondary | ICD-10-CM

## 2014-10-31 DIAGNOSIS — I10 Essential (primary) hypertension: Secondary | ICD-10-CM

## 2014-10-31 LAB — HEMOGLOBIN A1C: HEMOGLOBIN A1C: 8.5 % — AB (ref 4.6–6.5)

## 2014-10-31 LAB — LIPID PANEL
Cholesterol: 125 mg/dL (ref 0–200)
HDL: 37.2 mg/dL — ABNORMAL LOW (ref >39.00)
LDL Cholesterol: 67 mg/dL (ref 0–99)
NonHDL: 87.8
TRIGLYCERIDES: 103 mg/dL (ref 0.0–149.0)
Total CHOL/HDL Ratio: 3
VLDL: 20.6 mg/dL (ref 0.0–40.0)

## 2014-10-31 LAB — BASIC METABOLIC PANEL
BUN: 12 mg/dL (ref 6–23)
CALCIUM: 9.5 mg/dL (ref 8.4–10.5)
CO2: 33 mEq/L — ABNORMAL HIGH (ref 19–32)
CREATININE: 0.81 mg/dL (ref 0.40–1.50)
Chloride: 99 mEq/L (ref 96–112)
GFR: 107.08 mL/min (ref >60.00)
Glucose, Bld: 176 mg/dL — ABNORMAL HIGH (ref 70–99)
POTASSIUM: 4.2 meq/L (ref 3.5–5.1)
Sodium: 135 mEq/L (ref 135–145)

## 2014-10-31 MED ORDER — MORPHINE SULFATE ER 100 MG PO TBCR: 200.0000 mg | EXTENDED_RELEASE_TABLET | Freq: Three times a day (TID) | ORAL | Status: DC

## 2014-10-31 MED ORDER — MORPHINE SULFATE 15 MG PO TABS: 15.0000 mg | ORAL_TABLET | Freq: Two times a day (BID) | ORAL | Status: DC | PRN

## 2014-10-31 MED ORDER — AMLODIPINE BESYLATE-VALSARTAN 10-320 MG PO TABS: 1.0000 | ORAL_TABLET | Freq: Every day | ORAL | Status: DC

## 2014-10-31 NOTE — Progress Notes (Signed)
Pre visit review using our clinic review tool, if applicable. No additional management support is needed unless otherwise documented below in the visit note. 

## 2014-10-31 NOTE — Assessment & Plan Note (Signed)
Low carb diet discussed 

## 2014-10-31 NOTE — Progress Notes (Signed)
   Subjective:    HPI  The patient presents for a follow-up of  Chronic LBP, hypertension, chronic dyslipidemia, type 2 diabetes controlled with medicines. Not using testosterone... He is trying to cut back on cigs -- 2 ppd - not doing too well...    Wt Readings from Last 3 Encounters:  10/31/14 264 lb 4 oz (119.863 kg)  08/01/14 251 lb (113.853 kg)  06/29/14 245 lb (111.131 kg)   BP Readings from Last 3 Encounters:  10/31/14 140/80  08/01/14 118/70  06/29/14 110/60      Review of Systems  Constitutional: Negative for appetite change, fatigue and unexpected weight change.  HENT: Positive for congestion and postnasal drip. Negative for nosebleeds, sneezing, sore throat and trouble swallowing.   Eyes: Negative for itching and visual disturbance.  Respiratory: Positive for cough.   Cardiovascular: Negative for chest pain, palpitations and leg swelling.  Gastrointestinal: Negative for nausea, diarrhea, blood in stool and abdominal distention.  Genitourinary: Negative for frequency and hematuria.  Musculoskeletal: Positive for back pain. Negative for joint swelling, gait problem and neck pain.  Skin: Negative for rash.  Neurological: Negative for dizziness, tremors, speech difficulty and weakness.  Psychiatric/Behavioral: Negative for sleep disturbance, dysphoric mood and agitation. The patient is not nervous/anxious.        Objective:   Physical Exam  Constitutional: He is oriented to person, place, and time. He appears well-developed. No distress.  NAD  HENT:  Mouth/Throat: Oropharynx is clear and moist.  Eyes: Conjunctivae are normal. Pupils are equal, round, and reactive to light.  Neck: Normal range of motion. No JVD present. No thyromegaly present.  Cardiovascular: Normal rate, regular rhythm, normal heart sounds and intact distal pulses.  Exam reveals no gallop and no friction rub.   No murmur heard. Pulmonary/Chest: Effort normal and breath sounds normal. No  respiratory distress. He has no wheezes. He has no rales. He exhibits no tenderness.  Abdominal: Soft. Bowel sounds are normal. He exhibits no distension and no mass. There is no tenderness. There is no rebound and no guarding.  Musculoskeletal: Normal range of motion. He exhibits no edema or tenderness.  Lymphadenopathy:    He has no cervical adenopathy.  Neurological: He is alert and oriented to person, place, and time. He has normal reflexes. No cranial nerve deficit. He exhibits normal muscle tone. He displays a negative Romberg sign. Coordination and gait normal.  No meningeal signs  Skin: Skin is warm and dry. No rash noted.  Psychiatric: He has a normal mood and affect. His behavior is normal. Judgment and thought content normal.  upper full denture  Lab Results  Component Value Date   WBC 15.8* 10/26/2013   HGB 14.1 10/26/2013   HCT 42.5 10/26/2013   PLT 263.0 10/26/2013   GLUCOSE 105* 10/26/2013   CHOL 113 06/14/2014   TRIG 64.0 06/14/2014   HDL 33.60* 06/14/2014   LDLDIRECT 116.6 05/20/2007   LDLCALC 67 06/14/2014   ALT 27 10/26/2013   AST 26 10/26/2013   NA 134* 10/26/2013   K 4.4 10/26/2013   CL 99 10/26/2013   CREATININE 1.0 10/26/2013   BUN 18 10/26/2013   CO2 27 10/26/2013   TSH 3.73 06/04/2010   PSA 0.20 06/04/2010   INR 1.1 05/10/2008   HGBA1C 8.4* 06/14/2014   MICROALBUR 1.2 06/14/2014          Assessment & Plan:

## 2014-10-31 NOTE — Assessment & Plan Note (Signed)
MSIR MScontin  Potential benefits of a long term opioids use as well as potential risks (i.e. addiction risk, apnea etc) and complications (i.e. Somnolence, constipation and others) were explained to the patient and were aknowledged.

## 2014-10-31 NOTE — Assessment & Plan Note (Signed)
On Bystolic, Amlod-Valsartan NAS diet Wt loss 

## 2014-12-05 ENCOUNTER — Telehealth: Payer: Self-pay | Admitting: Internal Medicine

## 2014-12-05 NOTE — Telephone Encounter (Signed)
PA for morphine tablets sent to plan for review.

## 2014-12-05 NOTE — Telephone Encounter (Signed)
Wife states that [facility] needs a PA for morphine.  States patient is out today.

## 2014-12-05 NOTE — Telephone Encounter (Signed)
Pt's wife informed waiting on ins decision.

## 2014-12-05 NOTE — Telephone Encounter (Signed)
Patient is calling back in a rush to get an approval on the prescription for the morphine. He has none for tomorrow

## 2014-12-06 NOTE — Telephone Encounter (Signed)
Received form to fill out regarding additional clinical data for the use of morphine 100 mg 2 tab tid.   LVM for pt to call back.   RE: inform of above sentence.

## 2014-12-06 NOTE — Telephone Encounter (Signed)
Wife called back. Informed of communication faxed to insurance for clinical review.

## 2014-12-07 NOTE — Telephone Encounter (Signed)
Morphine PA is approved. Left detailed mess informing pt.

## 2014-12-11 ENCOUNTER — Telehealth: Payer: Self-pay | Admitting: Internal Medicine

## 2014-12-11 NOTE — Telephone Encounter (Signed)
Concerning the [employer]. Insurance said everything is cleared. But went to Pharmacy and prior authorization is only for 3 instead of 6 so nothing has been changed.

## 2014-12-11 NOTE — Telephone Encounter (Signed)
Left detailed mess informing pt.  

## 2014-12-11 NOTE — Telephone Encounter (Signed)
I called [employer] plan @1 [phone]. I spoke To Paul Halfaron F.  He states the Berkley Harveyauth is approved for # 6 daily and to have pharmacy re run claim in 2 hours. Pharmacy informed.

## 2015-01-31 ENCOUNTER — Encounter: Payer: Self-pay | Admitting: Internal Medicine

## 2015-01-31 ENCOUNTER — Ambulatory Visit (INDEPENDENT_AMBULATORY_CARE_PROVIDER_SITE_OTHER): Payer: BLUE CROSS/BLUE SHIELD | Admitting: Internal Medicine

## 2015-01-31 VITALS — BP 128/72 | HR 57 | Wt 256.0 lb

## 2015-01-31 DIAGNOSIS — I10 Essential (primary) hypertension: Secondary | ICD-10-CM | POA: Diagnosis not present

## 2015-01-31 DIAGNOSIS — E118 Type 2 diabetes mellitus with unspecified complications: Secondary | ICD-10-CM

## 2015-01-31 DIAGNOSIS — M5440 Lumbago with sciatica, unspecified side: Secondary | ICD-10-CM

## 2015-01-31 MED ORDER — MORPHINE SULFATE ER 100 MG PO TBCR: 200.0000 mg | EXTENDED_RELEASE_TABLET | Freq: Three times a day (TID) | ORAL | Status: DC

## 2015-01-31 MED ORDER — MORPHINE SULFATE 15 MG PO TABS: 15.0000 mg | ORAL_TABLET | Freq: Two times a day (BID) | ORAL | Status: DC | PRN

## 2015-01-31 MED ORDER — KETOROLAC TROMETHAMINE 10 MG PO TABS: 10.0000 mg | ORAL_TABLET | Freq: Three times a day (TID) | ORAL | Status: DC | PRN

## 2015-01-31 NOTE — Progress Notes (Signed)
Pre visit review using our clinic review tool, if applicable. No additional management support is needed unless otherwise documented below in the visit note. 

## 2015-01-31 NOTE — Progress Notes (Signed)
   Subjective:    HPI  The patient presents for a follow-up of  Chronic LBP, hypertension, chronic dyslipidemia, type 2 diabetes controlled with medicines. Not using testosterone... He is trying to cut back on cigs -- 2 ppd - not doing too well...    Wt Readings from Last 3 Encounters:  01/31/15 256 lb (116.121 kg)  10/31/14 264 lb 4 oz (119.863 kg)  08/01/14 251 lb (113.853 kg)   BP Readings from Last 3 Encounters:  01/31/15 128/72  10/31/14 140/80  08/01/14 118/70      Review of Systems  Constitutional: Negative for appetite change, fatigue and unexpected weight change.  HENT: Positive for congestion and postnasal drip. Negative for nosebleeds, sneezing, sore throat and trouble swallowing.   Eyes: Negative for itching and visual disturbance.  Respiratory: Positive for cough.   Cardiovascular: Negative for chest pain, palpitations and leg swelling.  Gastrointestinal: Negative for nausea, diarrhea, blood in stool and abdominal distention.  Genitourinary: Negative for frequency and hematuria.  Musculoskeletal: Positive for back pain. Negative for joint swelling, gait problem and neck pain.  Skin: Negative for rash.  Neurological: Negative for dizziness, tremors, speech difficulty and weakness.  Psychiatric/Behavioral: Negative for sleep disturbance, dysphoric mood and agitation. The patient is not nervous/anxious.        Objective:   Physical Exam  Constitutional: He is oriented to person, place, and time. He appears well-developed. No distress.  NAD  HENT:  Mouth/Throat: Oropharynx is clear and moist.  Eyes: Conjunctivae are normal. Pupils are equal, round, and reactive to light.  Neck: Normal range of motion. No JVD present. No thyromegaly present.  Cardiovascular: Normal rate, regular rhythm, normal heart sounds and intact distal pulses.  Exam reveals no gallop and no friction rub.   No murmur heard. Pulmonary/Chest: Effort normal and breath sounds normal. No  respiratory distress. He has no wheezes. He has no rales. He exhibits no tenderness.  Abdominal: Soft. Bowel sounds are normal. He exhibits no distension and no mass. There is no tenderness. There is no rebound and no guarding.  Musculoskeletal: Normal range of motion. He exhibits no edema or tenderness.  Lymphadenopathy:    He has no cervical adenopathy.  Neurological: He is alert and oriented to person, place, and time. He has normal reflexes. No cranial nerve deficit. He exhibits normal muscle tone. He displays a negative Romberg sign. Coordination and gait normal.  No meningeal signs  Skin: Skin is warm and dry. No rash noted.  Psychiatric: He has a normal mood and affect. His behavior is normal. Judgment and thought content normal.  upper full denture  Lab Results  Component Value Date   WBC 15.8* 10/26/2013   HGB 14.1 10/26/2013   HCT 42.5 10/26/2013   PLT 263.0 10/26/2013   GLUCOSE 176* 10/31/2014   CHOL 125 10/31/2014   TRIG 103.0 10/31/2014   HDL 37.20* 10/31/2014   LDLDIRECT 116.6 05/20/2007   LDLCALC 67 10/31/2014   ALT 27 10/26/2013   AST 26 10/26/2013   NA 135 10/31/2014   K 4.2 10/31/2014   CL 99 10/31/2014   CREATININE 0.81 10/31/2014   BUN 12 10/31/2014   CO2 33* 10/31/2014   TSH 3.73 06/04/2010   PSA 0.20 06/04/2010   INR 1.1 05/10/2008   HGBA1C 8.5* 10/31/2014   MICROALBUR 1.2 06/14/2014          Assessment & Plan:

## 2015-01-31 NOTE — Assessment & Plan Note (Signed)
Chronic severe LBP, on opioids On MSIR, MSContin

## 2015-01-31 NOTE — Assessment & Plan Note (Signed)
Dr Everardo AllEllison On Lantus

## 2015-01-31 NOTE — Assessment & Plan Note (Signed)
On Bystolic, Amlod-Valsartan NAS diet Wt loss

## 2015-02-08 ENCOUNTER — Other Ambulatory Visit: Payer: Self-pay | Admitting: Internal Medicine

## 2015-05-04 ENCOUNTER — Ambulatory Visit (INDEPENDENT_AMBULATORY_CARE_PROVIDER_SITE_OTHER): Payer: BLUE CROSS/BLUE SHIELD | Admitting: Internal Medicine

## 2015-05-04 ENCOUNTER — Encounter: Payer: Self-pay | Admitting: Internal Medicine

## 2015-05-04 ENCOUNTER — Other Ambulatory Visit (INDEPENDENT_AMBULATORY_CARE_PROVIDER_SITE_OTHER): Payer: BLUE CROSS/BLUE SHIELD

## 2015-05-04 VITALS — BP 110/78 | HR 72 | Wt 256.0 lb

## 2015-05-04 DIAGNOSIS — M5440 Lumbago with sciatica, unspecified side: Secondary | ICD-10-CM

## 2015-05-04 DIAGNOSIS — E1140 Type 2 diabetes mellitus with diabetic neuropathy, unspecified: Secondary | ICD-10-CM | POA: Diagnosis not present

## 2015-05-04 DIAGNOSIS — I10 Essential (primary) hypertension: Secondary | ICD-10-CM | POA: Diagnosis not present

## 2015-05-04 LAB — BASIC METABOLIC PANEL
BUN: 15 mg/dL (ref 6–23)
CHLORIDE: 104 meq/L (ref 96–112)
CO2: 25 meq/L (ref 19–32)
CREATININE: 0.91 mg/dL (ref 0.40–1.50)
Calcium: 9.3 mg/dL (ref 8.4–10.5)
GFR: 93.43 mL/min (ref >60.00)
GLUCOSE: 92 mg/dL (ref 70–99)
POTASSIUM: 3.8 meq/L (ref 3.5–5.1)
Sodium: 139 mEq/L (ref 135–145)

## 2015-05-04 LAB — HEMOGLOBIN A1C: Hgb A1c MFr Bld: 8.4 % — ABNORMAL HIGH (ref 4.6–6.5)

## 2015-05-04 MED ORDER — MORPHINE SULFATE 15 MG PO TABS: 15.0000 mg | ORAL_TABLET | Freq: Two times a day (BID) | ORAL | Status: DC | PRN

## 2015-05-04 MED ORDER — MORPHINE SULFATE ER 100 MG PO TBCR: 200.0000 mg | EXTENDED_RELEASE_TABLET | Freq: Three times a day (TID) | ORAL | Status: DC

## 2015-05-04 NOTE — Assessment & Plan Note (Addendum)
Labs On Bystolic, Amlod-Valsartan

## 2015-05-04 NOTE — Assessment & Plan Note (Signed)
Chronic severe LBP, on opioids On MSIR, MSContin   Potential benefits of a long term opioids  use as well as potential risks  and complications were explained to the patient and were aknowledged. 

## 2015-05-04 NOTE — Progress Notes (Signed)
Subjective:  Patient ID: [name] [name], male    DOB: 11/12/1965  Age: 49 y.o. MRN: [id number]  CC: No chief complaint on file.   HPI BRAVLIO LUCA presents for DM, HTN, LBP f/u  Outpatient Prescriptions Prior to Visit  Medication Sig Dispense Refill  . amLODipine-valsartan (EXFORGE) 10-320 MG per tablet Take 1 tablet by mouth daily. 90 tablet 3  . aspirin 325 MG tablet Take 325 mg by mouth daily.      . B-D UF III MINI PEN NEEDLES 31G X 5 MM MISC USE AS DIRECTED 100 each 3  . Cholecalciferol (VITAMIN D3) 1000 UNITS CAPS Take 1 capsule by mouth daily.      . famciclovir (FAMVIR) 500 MG tablet Take 500 mg by mouth 3 (three) times daily.      . fluticasone (FLONASE) 50 MCG/ACT nasal spray Place 2 sprays into both nostrils daily. 42 g 3  . glucose blood (ONE TOUCH ULTRA TEST) test strip USE TWICE DAILY AS INSTRUCTED Dx code E11.9 100 each 3  . Insulin Glargine (LANTUS SOLOSTAR) 100 UNIT/ML Solostar Pen Inject 160 Units into the skin every morning. And pen needles 3/day 20 pen PRN  . ketorolac (TORADOL) 10 MG tablet Take 1 tablet (10 mg total) by mouth every 8 (eight) hours as needed for severe pain (renal colic). 20 tablet 0  . lovastatin (MEVACOR) 20 MG tablet TAKE 1 TABLET BY MOUTH EVERY DAY 90 tablet 3  . nebivolol (BYSTOLIC) 10 MG tablet Take 10 mg by mouth daily.    . ONE TOUCH ULTRA TEST test strip USE TWICE DAILY AS INSTRUCTED 100 each 3  . ONETOUCH DELICA LANCETS 33G MISC Use as directed 100 each 1  . promethazine (PHENERGAN) 25 MG tablet Take 1 tablet (25 mg total) by mouth every 6 (six) hours as needed. 30 tablet 1  . tadalafil (CIALIS) 20 MG tablet Take 20 mg by mouth. 1/2 to 1 tablet by mouth every 1-3 days     . Testosterone Micronized CRYS 1 ml on skin q am 30 g 5  . Umeclidinium-Vilanterol (ANORO ELLIPTA) 62.5-25 MCG/INH AEPB Inhale 1 Act into the lungs daily. 1 each 11  . morphine (MS CONTIN) 100 MG 12 hr tablet Take 2 tablets (200 mg total) by mouth 3 (three) times  daily. Please fill on or after 04/06/15 180 tablet 0  . morphine (MSIR) 15 MG tablet Take 1 tablet (15 mg total) by mouth 2 (two) times daily as needed for severe pain. For breakthrough pain  Fill on or after 04/06/15 60 tablet 0  . glucose blood test strip Use as instructed     No facility-administered medications prior to visit.    ROS Review of Systems  Objective:  BP 110/78 mmHg  Pulse 72  Wt 256 lb (116.121 kg)  SpO2 92%  BP Readings from Last 3 Encounters:  05/04/15 110/78  01/31/15 128/72  10/31/14 140/80    Wt Readings from Last 3 Encounters:  05/04/15 256 lb (116.121 kg)  01/31/15 256 lb (116.121 kg)  10/31/14 264 lb 4 oz (119.863 kg)    Physical Exam  Lab Results  Component Value Date   WBC 15.8* 10/26/2013   HGB 14.1 10/26/2013   HCT 42.5 10/26/2013   PLT 263.0 10/26/2013   GLUCOSE 92 05/04/2015   CHOL 125 10/31/2014   TRIG 103.0 10/31/2014   HDL 37.20* 10/31/2014   LDLDIRECT 116.6 05/20/2007   LDLCALC 67 10/31/2014   ALT 27 10/26/2013  AST 26 10/26/2013   NA 139 05/04/2015   K 3.8 05/04/2015   CL 104 05/04/2015   CREATININE 0.91 05/04/2015   BUN 15 05/04/2015   CO2 25 05/04/2015   TSH 3.73 06/04/2010   PSA 0.20 06/04/2010   INR 1.1 05/10/2008   HGBA1C 8.4* 05/04/2015   MICROALBUR 1.2 06/14/2014    Dg Chest 2 View  06/29/2014   CLINICAL DATA:  Cough, congestion, shortness of breath for 3 weeks  EXAM: CHEST  2 VIEW  COMPARISON:  09/02/2009  FINDINGS: Cardiomediastinal silhouette is unremarkable. No acute infiltrate or pleural effusion. No pulmonary edema. Bony thorax is unremarkable.  IMPRESSION: No active cardiopulmonary disease.   Electronically Signed   By: Natasha Mead M.D.   On: 06/29/2014 15:36    Assessment & Plan:   Diagnoses and all orders for this visit:  Essential hypertension -     Hemoglobin A1c; Future -     Basic metabolic panel; Future  Midline low back pain with sciatica, sciatica laterality unspecified -     Basic  metabolic panel; Future  Type 2 diabetes mellitus with diabetic neuropathy  Other orders -     Discontinue: morphine (MSIR) 15 MG tablet; Take 1 tablet (15 mg total) by mouth 2 (two) times daily as needed for severe pain. For breakthrough pain  Fill on or after 05/07/15 -     Discontinue: morphine (MS CONTIN) 100 MG 12 hr tablet; Take 2 tablets (200 mg total) by mouth 3 (three) times daily. Please fill on or after 05/07/15 -     Discontinue: morphine (MS CONTIN) 100 MG 12 hr tablet; Take 2 tablets (200 mg total) by mouth 3 (three) times daily. Please fill on or after 06/06/15 -     Discontinue: morphine (MSIR) 15 MG tablet; Take 1 tablet (15 mg total) by mouth 2 (two) times daily as needed for severe pain. For breakthrough pain  Fill on or after 06/06/15 -     morphine (MS CONTIN) 100 MG 12 hr tablet; Take 2 tablets (200 mg total) by mouth 3 (three) times daily. Please fill on or after 07/07/15 -     morphine (MSIR) 15 MG tablet; Take 1 tablet (15 mg total) by mouth 2 (two) times daily as needed for severe pain. For breakthrough pain  Fill on or after 07/07/15  I have discontinued Mr. Heringer's morphine, morphine, morphine, and morphine. I have also changed his morphine and morphine. Additionally, I am having him maintain his aspirin, tadalafil, Vitamin D3, famciclovir, Testosterone Micronized, nebivolol, glucose blood, B-D UF III MINI PEN NEEDLES, Insulin Glargine, fluticasone, promethazine, Umeclidinium-Vilanterol, ONE TOUCH ULTRA TEST, glucose blood, ONETOUCH DELICA LANCETS 33G, amLODipine-valsartan, ketorolac, and lovastatin.  Meds ordered this encounter  Medications  . DISCONTD: morphine (MSIR) 15 MG tablet    Sig: Take 1 tablet (15 mg total) by mouth 2 (two) times daily as needed for severe pain. For breakthrough pain  Fill on or after 05/07/15    Dispense:  60 tablet    Refill:  0  . DISCONTD: morphine (MS CONTIN) 100 MG 12 hr tablet    Sig: Take 2 tablets (200 mg total) by mouth 3 (three)  times daily. Please fill on or after 05/07/15    Dispense:  180 tablet    Refill:  0  . DISCONTD: morphine (MS CONTIN) 100 MG 12 hr tablet    Sig: Take 2 tablets (200 mg total) by mouth 3 (three) times daily. Please fill on or after  06/06/15    Dispense:  180 tablet    Refill:  0  . DISCONTD: morphine (MSIR) 15 MG tablet    Sig: Take 1 tablet (15 mg total) by mouth 2 (two) times daily as needed for severe pain. For breakthrough pain  Fill on or after 06/06/15    Dispense:  60 tablet    Refill:  0  . morphine (MS CONTIN) 100 MG 12 hr tablet    Sig: Take 2 tablets (200 mg total) by mouth 3 (three) times daily. Please fill on or after 07/07/15    Dispense:  180 tablet    Refill:  0  . morphine (MSIR) 15 MG tablet    Sig: Take 1 tablet (15 mg total) by mouth 2 (two) times daily as needed for severe pain. For breakthrough pain  Fill on or after 07/07/15    Dispense:  60 tablet    Refill:  0     Follow-up: Return in about 3 months (around 08/03/2015) for a follow-up visit.  Sonda Primes, MD

## 2015-05-04 NOTE — Progress Notes (Signed)
Pre visit review using our clinic review tool, if applicable. No additional management support is needed unless otherwise documented below in the visit note. 

## 2015-05-04 NOTE — Assessment & Plan Note (Signed)
On Lantus Poor compliance Risks associated with treatment noncompliance were discussed. Compliance was encouraged. 

## 2015-06-18 ENCOUNTER — Other Ambulatory Visit: Payer: Self-pay | Admitting: Internal Medicine

## 2015-07-16 ENCOUNTER — Other Ambulatory Visit: Payer: Self-pay | Admitting: Endocrinology

## 2015-07-17 NOTE — Telephone Encounter (Signed)
Refill x 1 Ov is due 

## 2015-07-17 NOTE — Telephone Encounter (Signed)
Please advise if we can refill this med. Last office visit was 07/05/2014.

## 2015-08-03 ENCOUNTER — Other Ambulatory Visit (INDEPENDENT_AMBULATORY_CARE_PROVIDER_SITE_OTHER): Payer: 59

## 2015-08-03 ENCOUNTER — Encounter: Payer: Self-pay | Admitting: Internal Medicine

## 2015-08-03 ENCOUNTER — Ambulatory Visit (INDEPENDENT_AMBULATORY_CARE_PROVIDER_SITE_OTHER): Payer: BLUE CROSS/BLUE SHIELD | Admitting: Internal Medicine

## 2015-08-03 VITALS — BP 112/78 | HR 61 | Wt 261.0 lb

## 2015-08-03 DIAGNOSIS — G8929 Other chronic pain: Secondary | ICD-10-CM

## 2015-08-03 DIAGNOSIS — M5442 Lumbago with sciatica, left side: Secondary | ICD-10-CM

## 2015-08-03 DIAGNOSIS — E1165 Type 2 diabetes mellitus with hyperglycemia: Secondary | ICD-10-CM

## 2015-08-03 DIAGNOSIS — Z794 Long term (current) use of insulin: Secondary | ICD-10-CM

## 2015-08-03 DIAGNOSIS — I10 Essential (primary) hypertension: Secondary | ICD-10-CM

## 2015-08-03 DIAGNOSIS — E1121 Type 2 diabetes mellitus with diabetic nephropathy: Secondary | ICD-10-CM

## 2015-08-03 DIAGNOSIS — IMO0002 Reserved for concepts with insufficient information to code with codable children: Secondary | ICD-10-CM

## 2015-08-03 DIAGNOSIS — M5441 Lumbago with sciatica, right side: Secondary | ICD-10-CM

## 2015-08-03 LAB — BASIC METABOLIC PANEL
BUN: 11 mg/dL (ref 6–23)
CALCIUM: 9.2 mg/dL (ref 8.4–10.5)
CO2: 28 meq/L (ref 19–32)
Chloride: 102 mEq/L (ref 96–112)
Creatinine, Ser: 0.87 mg/dL (ref 0.40–1.50)
GFR: 98.31 mL/min (ref >60.00)
GLUCOSE: 94 mg/dL (ref 70–99)
POTASSIUM: 4.2 meq/L (ref 3.5–5.1)
SODIUM: 137 meq/L (ref 135–145)

## 2015-08-03 LAB — HEMOGLOBIN A1C: Hgb A1c MFr Bld: 8.5 % — ABNORMAL HIGH (ref 4.6–6.5)

## 2015-08-03 MED ORDER — MORPHINE SULFATE ER 100 MG PO TBCR: 200.0000 mg | EXTENDED_RELEASE_TABLET | Freq: Three times a day (TID) | ORAL | Status: DC

## 2015-08-03 MED ORDER — MORPHINE SULFATE 15 MG PO TABS: 15.0000 mg | ORAL_TABLET | Freq: Two times a day (BID) | ORAL | Status: DC | PRN

## 2015-08-03 NOTE — Progress Notes (Signed)
Subjective:  Patient ID: [name] [name], male    DOB: Jun 01, 1966  Age: 49 y.o. MRN: [id number]  CC: No chief complaint on file.   HPI [name] [name] presents for HTN, LBP, DM f/u  Outpatient Prescriptions Prior to Visit  Medication Sig Dispense Refill  . amLODipine-valsartan (EXFORGE) 10-320 MG per tablet Take 1 tablet by mouth daily. 90 tablet 3  . aspirin 325 MG tablet Take 325 mg by mouth daily.      . B-D UF III MINI PEN NEEDLES 31G X 5 MM MISC USE AS DIRECTED 100 each 3  . Cholecalciferol (VITAMIN D3) 1000 UNITS CAPS Take 1 capsule by mouth daily.      . famciclovir (FAMVIR) 500 MG tablet Take 500 mg by mouth 3 (three) times daily.      . fluticasone (FLONASE) 50 MCG/ACT nasal spray Place 2 sprays into both nostrils daily. 42 g 3  . glucose blood (ONE TOUCH ULTRA TEST) test strip USE TWICE DAILY AS INSTRUCTED Dx code E11.9 100 each 3  . ketorolac (TORADOL) 10 MG tablet Take 1 tablet (10 mg total) by mouth every 8 (eight) hours as needed for severe pain (renal colic). 20 tablet 0  . LANTUS SOLOSTAR 100 UNIT/ML Solostar Pen INJECT 160 UNITS SUBCUTANEOUSLY EVERY MORNING 60 mL 0  . lovastatin (MEVACOR) 20 MG tablet TAKE 1 TABLET BY MOUTH EVERY DAY 90 tablet 3  . nebivolol (BYSTOLIC) 10 MG tablet Take 10 mg by mouth daily.    . ONE TOUCH ULTRA TEST test strip USE TWICE DAILY AS INSTRUCTED 100 each 3  . ONETOUCH DELICA LANCETS 33G MISC USE AS DIRECTED 100 each 2  . promethazine (PHENERGAN) 25 MG tablet Take 1 tablet (25 mg total) by mouth every 6 (six) hours as needed. 30 tablet 1  . RELION SHORT PEN NEEDLES 31G X 8 MM MISC USE ONE NEEDLE TO INJECT INSULIN THREE TIMES DAILY 100 each 0  . tadalafil (CIALIS) 20 MG tablet Take 20 mg by mouth. 1/2 to 1 tablet by mouth every 1-3 days     . Testosterone Micronized CRYS 1 ml on skin q am 30 g 5  . Umeclidinium-Vilanterol (ANORO ELLIPTA) 62.5-25 MCG/INH AEPB Inhale 1 Act into the lungs daily. 1 each 11  . morphine (MS CONTIN) 100 MG 12 hr  tablet Take 2 tablets (200 mg total) by mouth 3 (three) times daily. Please fill on or after 07/07/15 180 tablet 0  . morphine (MSIR) 15 MG tablet Take 1 tablet (15 mg total) by mouth 2 (two) times daily as needed for severe pain. For breakthrough pain  Fill on or after 07/07/15 60 tablet 0  . glucose blood test strip Use as instructed     No facility-administered medications prior to visit.    ROS Review of Systems  Constitutional: Negative for appetite change, fatigue and unexpected weight change.  HENT: Negative for congestion, nosebleeds, sneezing, sore throat and trouble swallowing.   Eyes: Negative for itching and visual disturbance.  Respiratory: Negative for cough.   Cardiovascular: Negative for chest pain, palpitations and leg swelling.  Gastrointestinal: Negative for nausea, diarrhea, blood in stool and abdominal distention.  Genitourinary: Negative for frequency and hematuria.  Musculoskeletal: Positive for back pain. Negative for joint swelling, gait problem and neck pain.  Skin: Negative for rash.  Neurological: Negative for dizziness, tremors, speech difficulty and weakness.  Psychiatric/Behavioral: Negative for suicidal ideas, sleep disturbance, dysphoric mood and agitation. The patient is not nervous/anxious.  Objective:  BP 112/78 mmHg  Pulse 61  Wt 261 lb (118.389 kg)  SpO2 95%  BP Readings from Last 3 Encounters:  08/03/15 112/78  05/04/15 110/78  01/31/15 128/72    Wt Readings from Last 3 Encounters:  08/03/15 261 lb (118.389 kg)  05/04/15 256 lb (116.121 kg)  01/31/15 256 lb (116.121 kg)    Physical Exam  Constitutional: He is oriented to person, place, and time. He appears well-developed. No distress.  NAD  HENT:  Mouth/Throat: Oropharynx is clear and moist.  Eyes: Conjunctivae are normal. Pupils are equal, round, and reactive to light.  Neck: Normal range of motion. No JVD present. No thyromegaly present.  Cardiovascular: Normal rate, regular  rhythm, normal heart sounds and intact distal pulses.  Exam reveals no gallop and no friction rub.   No murmur heard. Pulmonary/Chest: Effort normal and breath sounds normal. No respiratory distress. He has no wheezes. He has no rales. He exhibits no tenderness.  Abdominal: Soft. Bowel sounds are normal. He exhibits no distension and no mass. There is no tenderness. There is no rebound and no guarding.  Musculoskeletal: Normal range of motion. He exhibits no edema or tenderness.  Lymphadenopathy:    He has no cervical adenopathy.  Neurological: He is alert and oriented to person, place, and time. He has normal reflexes. No cranial nerve deficit. He exhibits normal muscle tone. He displays a negative Romberg sign. Coordination and gait normal.  Skin: Skin is warm and dry. No rash noted.  Psychiatric: He has a normal mood and affect. His behavior is normal. Judgment and thought content normal.    Lab Results  Component Value Date   WBC 15.8* 10/26/2013   HGB 14.1 10/26/2013   HCT 42.5 10/26/2013   PLT 263.0 10/26/2013   GLUCOSE 94 08/03/2015   CHOL 125 10/31/2014   TRIG 103.0 10/31/2014   HDL 37.20* 10/31/2014   LDLDIRECT 116.6 05/20/2007   LDLCALC 67 10/31/2014   ALT 27 10/26/2013   AST 26 10/26/2013   NA 137 08/03/2015   K 4.2 08/03/2015   CL 102 08/03/2015   CREATININE 0.87 08/03/2015   BUN 11 08/03/2015   CO2 28 08/03/2015   TSH 3.73 06/04/2010   PSA 0.20 06/04/2010   INR 1.1 05/10/2008   HGBA1C 8.5* 08/03/2015   MICROALBUR 1.2 06/14/2014    Dg Chest 2 View  06/29/2014  CLINICAL DATA:  Cough, congestion, shortness of breath for 3 weeks EXAM: CHEST  2 VIEW COMPARISON:  09/02/2009 FINDINGS: Cardiomediastinal silhouette is unremarkable. No acute infiltrate or pleural effusion. No pulmonary edema. Bony thorax is unremarkable. IMPRESSION: No active cardiopulmonary disease. Electronically Signed   By: Natasha MeadLiviu  Pop M.D.   On: 06/29/2014 15:36    Assessment & Plan:   Diagnoses  and all orders for this visit:  Essential hypertension -     Basic metabolic panel; Future -     Hemoglobin A1c; Future  Uncontrolled type 2 diabetes mellitus with diabetic nephropathy, with long-term current use of insulin (HCC) -     Basic metabolic panel; Future -     Hemoglobin A1c; Future  Chronic bilateral low back pain with bilateral sciatica -     Basic metabolic panel; Future -     Hemoglobin A1c; Future  Other orders -     Discontinue: morphine (MSIR) 15 MG tablet; Take 1 tablet (15 mg total) by mouth 2 (two) times daily as needed for severe pain. For breakthrough pain  Fill on or after  08/06/15 -     Discontinue: morphine (MS CONTIN) 100 MG 12 hr tablet; Take 2 tablets (200 mg total) by mouth 3 (three) times daily. Please fill on or after 08/06/15 -     Discontinue: morphine (MS CONTIN) 100 MG 12 hr tablet; Take 2 tablets (200 mg total) by mouth 3 (three) times daily. Please fill on or after 09/06/15 -     Discontinue: morphine (MSIR) 15 MG tablet; Take 1 tablet (15 mg total) by mouth 2 (two) times daily as needed for severe pain. For breakthrough pain  Fill on or after 09/06/15 -     morphine (MS CONTIN) 100 MG 12 hr tablet; Take 2 tablets (200 mg total) by mouth 3 (three) times daily. Please fill on or after 10/07/15 -     morphine (MSIR) 15 MG tablet; Take 1 tablet (15 mg total) by mouth 2 (two) times daily as needed for severe pain. For breakthrough pain  Fill on or after 10/07/15   I have discontinued Mr. Peppard's morphine, morphine, morphine, and morphine. I have also changed his morphine and morphine. Additionally, I am having him maintain his aspirin, tadalafil, Vitamin D3, famciclovir, Testosterone Micronized, nebivolol, glucose blood, B-D UF III MINI PEN NEEDLES, fluticasone, promethazine, Umeclidinium-Vilanterol, ONE TOUCH ULTRA TEST, glucose blood, amLODipine-valsartan, ketorolac, lovastatin, ONETOUCH DELICA LANCETS 33G, LANTUS SOLOSTAR, and RELION SHORT PEN NEEDLES.  Meds  ordered this encounter  Medications  . DISCONTD: morphine (MSIR) 15 MG tablet    Sig: Take 1 tablet (15 mg total) by mouth 2 (two) times daily as needed for severe pain. For breakthrough pain  Fill on or after 08/06/15    Dispense:  60 tablet    Refill:  0  . DISCONTD: morphine (MS CONTIN) 100 MG 12 hr tablet    Sig: Take 2 tablets (200 mg total) by mouth 3 (three) times daily. Please fill on or after 08/06/15    Dispense:  180 tablet    Refill:  0  . DISCONTD: morphine (MS CONTIN) 100 MG 12 hr tablet    Sig: Take 2 tablets (200 mg total) by mouth 3 (three) times daily. Please fill on or after 09/06/15    Dispense:  180 tablet    Refill:  0  . DISCONTD: morphine (MSIR) 15 MG tablet    Sig: Take 1 tablet (15 mg total) by mouth 2 (two) times daily as needed for severe pain. For breakthrough pain  Fill on or after 09/06/15    Dispense:  60 tablet    Refill:  0  . morphine (MS CONTIN) 100 MG 12 hr tablet    Sig: Take 2 tablets (200 mg total) by mouth 3 (three) times daily. Please fill on or after 10/07/15    Dispense:  180 tablet    Refill:  0  . morphine (MSIR) 15 MG tablet    Sig: Take 1 tablet (15 mg total) by mouth 2 (two) times daily as needed for severe pain. For breakthrough pain  Fill on or after 10/07/15    Dispense:  60 tablet    Refill:  0     Follow-up: Return in about 3 months (around 11/01/2015) for a follow-up visit.  Sonda Primes, MD

## 2015-08-03 NOTE — Assessment & Plan Note (Signed)
On Bystolic, Amlod-Valsartan NAS diet

## 2015-08-03 NOTE — Progress Notes (Signed)
Pre visit review using our clinic review tool, if applicable. No additional management support is needed unless otherwise documented below in the visit note. 

## 2015-08-03 NOTE — Assessment & Plan Note (Signed)
On Lantus Labs

## 2015-08-03 NOTE — Assessment & Plan Note (Signed)
On MSIR, MSContin   Potential benefits of a long term opioids  use as well as potential risks  and complications were explained to the patient and were aknowledged. 

## 2015-09-14 ENCOUNTER — Telehealth: Payer: Self-pay

## 2015-09-14 NOTE — Telephone Encounter (Signed)
Called patient about prior authorization. Patients wife left a message stating that they needed a PA completed but did not state on which medication.

## 2015-09-14 NOTE — Telephone Encounter (Signed)
PA initiated via covermymeds. Key for PA is [id number]

## 2015-09-17 ENCOUNTER — Other Ambulatory Visit: Payer: Self-pay | Admitting: Internal Medicine

## 2015-09-24 NOTE — Telephone Encounter (Signed)
Pt called to check up on this request, please check, they have been waiting since 09/06/2015. Please call back

## 2015-09-28 NOTE — Telephone Encounter (Signed)
New PA started for Morphine 100 mg (Key [id number]), per insurance "[employer] will review the request and fax you a determination directly, typically within 3 business days of your submission once all necessary information is received". Spouse advised of same

## 2015-09-28 NOTE — Telephone Encounter (Signed)
Pt called to check up on this PA, pt need to know the update and this and they have been waiting for a long time. Please check into this a call them back  Phone # [phone]

## 2015-10-02 NOTE — Telephone Encounter (Signed)
PA submitted to incorrect insurance company (pt no longer has [employer] now with [facility]). New PA initiated via [facility], pt's spouse informed  [facility] ID [id number] Grp [id number] [place] [phone]

## 2015-10-03 NOTE — Telephone Encounter (Signed)
PA Morphine 100 mg has been denied. New insurance plan requires pt to try Nucynta ER, Opana ER, Fentanyl, or Xtampza ER. I did speak with pt's insurance company and although her has been on his current medication since 2012, they are still requiring pt try and fail all of the above, please advise

## 2015-10-05 MED ORDER — OXYMORPHONE HCL ER 40 MG PO T12A: 80.0000 mg | EXTENDED_RELEASE_TABLET | Freq: Three times a day (TID) | ORAL | Status: DC

## 2015-10-05 NOTE — Telephone Encounter (Signed)
Done - converted to Opana ER  Thx

## 2015-10-05 NOTE — Telephone Encounter (Signed)
Please call patient's wife back in regards.

## 2015-10-05 NOTE — Addendum Note (Signed)
Addended by: Tresa Garter on: 10/05/2015 01:23 PM   Modules accepted: Orders, Medications

## 2015-10-05 NOTE — Telephone Encounter (Signed)
Opana ER rx is upfront for p/u.

## 2015-10-05 NOTE — Telephone Encounter (Signed)
Left detailed mess informing pt.  

## 2015-10-08 ENCOUNTER — Telehealth: Payer: Self-pay | Admitting: Internal Medicine

## 2015-10-08 ENCOUNTER — Other Ambulatory Visit: Payer: Self-pay | Admitting: Endocrinology

## 2015-10-08 NOTE — Telephone Encounter (Signed)
Patient is having an issue with getting medication.  She picked up a new script for a medication that was suppose to be covered under his medication on Friday.  Took that to the pharmacy and insurance stated they would need a PA.  Ended up paying $900.00 for a second time on the previous medication(Morphine Sulphate ER) he was on.  Please follow up as soon as possible.

## 2015-10-08 NOTE — Telephone Encounter (Signed)
Per  Phamacy, pt picked up previous RX for Morphine (which is no longer covered). Rx was changed to Opana and pt was advised of same via VM per previous phone note. I attempted to call pt to discuss, no answer, no VM.

## 2015-10-08 NOTE — Telephone Encounter (Signed)
Per spouse, pt took Rx to pharmacy and was advised that PA was needed on Opana also.  Wife states that they received a letter from insurance stating that although Morphine was denied, it would receive and override thru the end of April, however because one part of the letter said tablet and the other said capsule, the pharmacy was unable to authorize override (?). Spouse says she will contact insurance for further clarification as she is now not sure if Opana required PA.

## 2015-10-10 ENCOUNTER — Telehealth: Payer: Self-pay | Admitting: Endocrinology

## 2015-10-10 MED ORDER — INSULIN GLARGINE 100 UNIT/ML SOLOSTAR PEN: PEN_INJECTOR | SUBCUTANEOUS | Status: DC

## 2015-10-10 NOTE — Telephone Encounter (Signed)
Pt wife called in and said that his lantus was denied due to him needing an appt, he made a follow up for 10/17/15 and said he is completely out and needs a refill asap.

## 2015-10-10 NOTE — Telephone Encounter (Signed)
Rx submitted pending pt's appointment.

## 2015-10-11 ENCOUNTER — Other Ambulatory Visit: Payer: Self-pay | Admitting: Endocrinology

## 2015-10-17 ENCOUNTER — Encounter: Payer: Self-pay | Admitting: Endocrinology

## 2015-10-17 ENCOUNTER — Ambulatory Visit (INDEPENDENT_AMBULATORY_CARE_PROVIDER_SITE_OTHER): Payer: 59 | Admitting: Endocrinology

## 2015-10-17 VITALS — BP 132/80 | HR 76 | Temp 98.0°F | Ht 70.0 in | Wt 263.0 lb

## 2015-10-17 DIAGNOSIS — E1165 Type 2 diabetes mellitus with hyperglycemia: Secondary | ICD-10-CM | POA: Diagnosis not present

## 2015-10-17 DIAGNOSIS — IMO0002 Reserved for concepts with insufficient information to code with codable children: Secondary | ICD-10-CM

## 2015-10-17 DIAGNOSIS — Z794 Long term (current) use of insulin: Secondary | ICD-10-CM | POA: Diagnosis not present

## 2015-10-17 DIAGNOSIS — E1121 Type 2 diabetes mellitus with diabetic nephropathy: Secondary | ICD-10-CM

## 2015-10-17 DIAGNOSIS — E119 Type 2 diabetes mellitus without complications: Secondary | ICD-10-CM

## 2015-10-17 LAB — POCT GLYCOSYLATED HEMOGLOBIN (HGB A1C): Hemoglobin A1C: 8

## 2015-10-17 MED ORDER — INSULIN ASPART 100 UNIT/ML FLEXPEN: 20.0000 [IU] | PEN_INJECTOR | Freq: Three times a day (TID) | SUBCUTANEOUS | Status: DC

## 2015-10-17 MED ORDER — INSULIN GLARGINE 100 UNIT/ML SOLOSTAR PEN: 120.0000 [IU] | PEN_INJECTOR | SUBCUTANEOUS | Status: DC

## 2015-10-17 NOTE — Patient Instructions (Addendum)
check your blood sugar twice a day.  vary the time of day when you check, between before the 3 meals, and at bedtime.  also check if you have symptoms of your blood sugar being too high or too low.  please keep a record of the readings and bring it to your next appointment here.  You can write it on any piece of paper.  please call us sooner if your blood sugar goes below 70, or if you have a lot of readings over 200. i have sent 2 prescriptions to your pharmacy: one is to reduce the lantus to 120 units each morning (and to change it to the generic, called "basaglar").  The other is to add "novolog," 20 units with the evening meal.   Please come back for a follow-up appointment in 2 months.

## 2015-10-17 NOTE — Progress Notes (Signed)
Subjective:    Patient ID: [name] [name], male    DOB: [birth year], 50 y.o.   MRN: [id number]  HPI Pt returns for f/u of diabetes mellitus:  DM type: Insulin-requiring type 2 Dx'ed: 2005 Complications: none Therapy: insulin since 2007 DKA: never Severe hypoglycemia: never Pancreatitis: never.   Other: he takes qd insulin, due to h/o noncompliance.   Interval history: He takes lantus, 140 units qam.  no cbg record, but states cbg's are frequently mildly low in the afternoon.  He does not check at hs, but says it is higher fasting than the afternoon.  pt states he feels well in general. Past Medical History  Diagnosis Date  . Diabetes mellitus   . Hyperlipidemia   . Hypertension   . GERD (gastroesophageal reflux disease)   . Lower back pain   . Nephrolithiasis     Dr Annabell Howells    No past surgical history on file.  Social History   Social History  . Marital Status: Married    Spouse Name: N/A  . Number of Children: N/A  . Years of Education: N/A   Occupational History  . Not on file.   Social History Main Topics  . Smoking status: Current Every Day Smoker -- 2.00 packs/day    Types: Cigarettes  . Smokeless tobacco: Not on file  . Alcohol Use: No  . Drug Use: No  . Sexual Activity: Yes   Other Topics Concern  . Not on file   Social History Narrative   No regular exercise    Current Outpatient Prescriptions on File Prior to Visit  Medication Sig Dispense Refill  . amLODipine-valsartan (EXFORGE) 10-320 MG per tablet Take 1 tablet by mouth daily. 90 tablet 3  . aspirin 325 MG tablet Take 325 mg by mouth daily.      . B-D UF III MINI PEN NEEDLES 31G X 5 MM MISC USE AS DIRECTED 100 each 3  . Cholecalciferol (VITAMIN D3) 1000 UNITS CAPS Take 1 capsule by mouth daily.      . fluticasone (FLONASE) 50 MCG/ACT nasal spray Place 2 sprays into both nostrils daily. 42 g 3  . glucose blood (ONE TOUCH ULTRA TEST) test strip USE TWICE DAILY AS INSTRUCTED Dx code E11.9 100  each 3  . ketorolac (TORADOL) 10 MG tablet Take 1 tablet (10 mg total) by mouth every 8 (eight) hours as needed for severe pain (renal colic). 20 tablet 0  . lovastatin (MEVACOR) 20 MG tablet TAKE 1 TABLET BY MOUTH EVERY DAY 90 tablet 3  . morphine (MSIR) 15 MG tablet Take 1 tablet (15 mg total) by mouth 2 (two) times daily as needed for severe pain. For breakthrough pain  Fill on or after 10/07/15 60 tablet 0  . nebivolol (BYSTOLIC) 10 MG tablet Take 10 mg by mouth daily.    . ONE TOUCH ULTRA TEST test strip USE TWICE DAILY AS INSTRUCTED 100 each 3  . ONETOUCH DELICA LANCETS 33G MISC USE AS DIRECTED 100 each 2  . Oxymorphone HCl, Crush Resist, (OPANA ER, CRUSH RESISTANT,) 40 MG T12A Take 80 mg by mouth 3 (three) times daily. 180 tablet 0  . promethazine (PHENERGAN) 25 MG tablet Take 1 tablet (25 mg total) by mouth every 6 (six) hours as needed. 30 tablet 1  . RELION SHORT PEN NEEDLES 31G X 8 MM MISC USE ONE NEEDLE TO INJECT INSULIN THREE TIMES DAILY 100 each 0  . Umeclidinium-Vilanterol (ANORO ELLIPTA) 62.5-25 MCG/INH AEPB Inhale 1 Act into  the lungs daily. 1 each 11  . famciclovir (FAMVIR) 500 MG tablet Take 500 mg by mouth 3 (three) times daily. Reported on 10/17/2015    . glucose blood test strip Use as instructed    . tadalafil (CIALIS) 20 MG tablet Take 20 mg by mouth. Reported on 10/17/2015    . Testosterone Micronized CRYS 1 ml on skin q am (Patient not taking: Reported on 10/17/2015) 30 g 5   No current facility-administered medications on file prior to visit.    Allergies  Allergen Reactions  . Belviq [Lorcaserin Hcl]     wierd  . Benazepril Hcl     REACTION: cough  . Nsaids     Can take short term Upset stomach    Family History  Problem Relation Age of Onset  . Heart disease Other     CAD male relative <60  . Hypertension Mother   . Diabetes Mother   . Vision loss Sister     BP 132/80 mmHg  Pulse 76  Temp(Src) 98 F (36.7 C) (Oral)  Ht  (1.778 m)  Wt 263 lb  (119.296 kg)  BMI 37.74 kg/m2  SpO2 93%  Review of Systems Denies LOC.      Objective:   Physical Exam VITAL SIGNS:  See vs page GENERAL: no distress Pulses: dorsalis pedis intact bilat.   MSK: no deformity of the feet CV: no leg edema Skin:  no ulcer on the feet.  normal color and temp on the feet. Neuro: sensation is intact to touch on the feet.     A1c=8.0%     Assessment & Plan:  DM: The pattern of his cbg's indicates he needs some adjustment in his therapy.  He agrees to take BID insulin.    Patient is advised the following: Patient Instructions  check your blood sugar twice a day.  vary the time of day when you check, between before the 3 meals, and at bedtime.  also check if you have symptoms of your blood sugar being too high or too low.  please keep a record of the readings and bring it to your next appointment here.  You can write it on any piece of paper.  please call us sooner if your blood sugar goes below 70, or if you have a lot of readings over 200. i have sent 2 prescriptions to your pharmacy: one is to reduce the lantus to 120 units each morning (and to change it to the generic, called "basaglar").  The other is to add "novolog," 20 units with the evening meal.   Please come back for a follow-up appointment in 2 months.

## 2015-10-18 LAB — MICROALBUMIN / CREATININE URINE RATIO
Creatinine,U: 80.6 mg/dL
Microalb Creat Ratio: 0.9 mg/g (ref 0.0–30.0)
Microalb, Ur: <0.7 mg/dL (ref 0.0–1.9)

## 2015-10-23 ENCOUNTER — Ambulatory Visit (INDEPENDENT_AMBULATORY_CARE_PROVIDER_SITE_OTHER): Payer: 59 | Admitting: Internal Medicine

## 2015-10-23 ENCOUNTER — Telehealth: Payer: Self-pay | Admitting: *Deleted

## 2015-10-23 ENCOUNTER — Encounter: Payer: Self-pay | Admitting: Internal Medicine

## 2015-10-23 VITALS — BP 140/80 | HR 70 | Wt 263.0 lb

## 2015-10-23 DIAGNOSIS — M5442 Lumbago with sciatica, left side: Secondary | ICD-10-CM

## 2015-10-23 DIAGNOSIS — M5441 Lumbago with sciatica, right side: Secondary | ICD-10-CM | POA: Diagnosis not present

## 2015-10-23 DIAGNOSIS — G8929 Other chronic pain: Secondary | ICD-10-CM

## 2015-10-23 MED ORDER — MORPHINE SULFATE 15 MG PO TABS: 15.0000 mg | ORAL_TABLET | Freq: Two times a day (BID) | ORAL | Status: DC | PRN

## 2015-10-23 MED ORDER — MORPHINE SULFATE ER 100 MG PO TBCR: 200.0000 mg | EXTENDED_RELEASE_TABLET | Freq: Three times a day (TID) | ORAL | Status: DC

## 2015-10-23 NOTE — Telephone Encounter (Signed)
I called [facility] at [phone]. I initiated the PA for Opana ER and answered all questions on the phone. The representative I spoke to states the review will be submitted for review.  We will receive a fax within 72 hours. Pt and wife informed of this at today's OV with PCP.

## 2015-10-23 NOTE — Assessment & Plan Note (Addendum)
  Off Rx - pt took it only for a short period of time - it did not help

## 2015-10-23 NOTE — Assessment & Plan Note (Addendum)
[employer] has changed drug coverage: Opana ER needs a PA. MS contin was denied, however the pt can have it until 12/01/15. MS contin Rx given in case if Opana ER is denied.

## 2015-10-23 NOTE — Progress Notes (Signed)
Pre visit review using our clinic review tool, if applicable. No additional management support is needed unless otherwise documented below in the visit note. 

## 2015-10-23 NOTE — Progress Notes (Signed)
Subjective:  Patient ID: [name] [name], male    DOB: 05/05/1966  Age: 50 y.o. MRN: [id number]  CC: No chief complaint on file.   HPI [name] [name] presents for chronic pain f/u - [employer] has changed drug coverage: Opana ER needs a PA. MS contin was denied, however the pt can have it until 12/01/15. F/u DM, HTN  Outpatient Prescriptions Prior to Visit  Medication Sig Dispense Refill  . amLODipine-valsartan (EXFORGE) 10-320 MG per tablet Take 1 tablet by mouth daily. 90 tablet 3  . aspirin 325 MG tablet Take 325 mg by mouth daily.      . B-D UF III MINI PEN NEEDLES 31G X 5 MM MISC USE AS DIRECTED 100 each 3  . Cholecalciferol (VITAMIN D3) 1000 UNITS CAPS Take 1 capsule by mouth daily.      . famciclovir (FAMVIR) 500 MG tablet Take 500 mg by mouth 3 (three) times daily. Reported on 10/17/2015    . fluticasone (FLONASE) 50 MCG/ACT nasal spray Place 2 sprays into both nostrils daily. 42 g 3  . glucose blood (ONE TOUCH ULTRA TEST) test strip USE TWICE DAILY AS INSTRUCTED Dx code E11.9 100 each 3  . insulin aspart (NOVOLOG FLEXPEN) 100 UNIT/ML FlexPen Inject 20 Units into the skin 3 (three) times daily with meals. 15 mL 11  . Insulin Glargine (BASAGLAR KWIKPEN) 100 UNIT/ML Solostar Pen Inject 120 Units into the skin every morning. And pen needles 3/day 45 mL 11  . ketorolac (TORADOL) 10 MG tablet Take 1 tablet (10 mg total) by mouth every 8 (eight) hours as needed for severe pain (renal colic). 20 tablet 0  . lovastatin (MEVACOR) 20 MG tablet TAKE 1 TABLET BY MOUTH EVERY DAY 90 tablet 3  . morphine (MSIR) 15 MG tablet Take 1 tablet (15 mg total) by mouth 2 (two) times daily as needed for severe pain. For breakthrough pain  Fill on or after 10/07/15 60 tablet 0  . nebivolol (BYSTOLIC) 10 MG tablet Take 10 mg by mouth daily.    . ONE TOUCH ULTRA TEST test strip USE TWICE DAILY AS INSTRUCTED 100 each 3  . ONETOUCH DELICA LANCETS 33G MISC USE AS DIRECTED 100 each 2  . Oxymorphone HCl, Crush Resist,  (OPANA ER, CRUSH RESISTANT,) 40 MG T12A Take 80 mg by mouth 3 (three) times daily. 180 tablet 0  . promethazine (PHENERGAN) 25 MG tablet Take 1 tablet (25 mg total) by mouth every 6 (six) hours as needed. 30 tablet 1  . RELION SHORT PEN NEEDLES 31G X 8 MM MISC USE ONE NEEDLE TO INJECT INSULIN THREE TIMES DAILY 100 each 0  . tadalafil (CIALIS) 20 MG tablet Take 20 mg by mouth. Reported on 10/17/2015    . Testosterone Micronized CRYS 1 ml on skin q am 30 g 5  . Umeclidinium-Vilanterol (ANORO ELLIPTA) 62.5-25 MCG/INH AEPB Inhale 1 Act into the lungs daily. 1 each 11  . glucose blood test strip Use as instructed     No facility-administered medications prior to visit.    ROS Review of Systems  Constitutional: Negative for appetite change, fatigue and unexpected weight change.  HENT: Negative for congestion, nosebleeds, sneezing, sore throat and trouble swallowing.   Eyes: Negative for itching and visual disturbance.  Respiratory: Negative for cough.   Cardiovascular: Negative for chest pain, palpitations and leg swelling.  Gastrointestinal: Negative for nausea, diarrhea, blood in stool and abdominal distention.  Genitourinary: Negative for frequency and hematuria.  Musculoskeletal: Positive for back  pain. Negative for joint swelling, gait problem and neck pain.  Skin: Negative for rash.  Neurological: Negative for dizziness, tremors, speech difficulty and weakness.  Psychiatric/Behavioral: Negative for suicidal ideas, sleep disturbance, dysphoric mood and agitation. The patient is not nervous/anxious.     Objective:  BP 140/80 mmHg  Pulse 70  Wt 263 lb (119.296 kg)  SpO2 96%  BP Readings from Last 3 Encounters:  10/23/15 140/80  10/17/15 132/80  08/03/15 112/78    Wt Readings from Last 3 Encounters:  10/23/15 263 lb (119.296 kg)  10/17/15 263 lb (119.296 kg)  08/03/15 261 lb (118.389 kg)    Physical Exam  Constitutional: He is oriented to person, place, and time. He appears  well-developed. No distress.  NAD  HENT:  Mouth/Throat: Oropharynx is clear and moist.  Eyes: Conjunctivae are normal. Pupils are equal, round, and reactive to light.  Neck: Normal range of motion. No JVD present. No thyromegaly present.  Cardiovascular: Normal rate, regular rhythm, normal heart sounds and intact distal pulses.  Exam reveals no gallop and no friction rub.   No murmur heard. Pulmonary/Chest: Effort normal and breath sounds normal. No respiratory distress. He has no wheezes. He has no rales. He exhibits no tenderness.  Abdominal: Soft. Bowel sounds are normal. He exhibits no distension and no mass. There is no tenderness. There is no rebound and no guarding.  Musculoskeletal: Normal range of motion. He exhibits tenderness. He exhibits no edema.  Lymphadenopathy:    He has no cervical adenopathy.  Neurological: He is alert and oriented to person, place, and time. He has normal reflexes. No cranial nerve deficit. He exhibits normal muscle tone. He displays a negative Romberg sign. Coordination and gait normal.  Skin: Skin is warm and dry. No rash noted.  Psychiatric: He has a normal mood and affect. His behavior is normal. Judgment and thought content normal.  Obese  Lab Results  Component Value Date   WBC 15.8* 10/26/2013   HGB 14.1 10/26/2013   HCT 42.5 10/26/2013   PLT 263.0 10/26/2013   GLUCOSE 94 08/03/2015   CHOL 125 10/31/2014   TRIG 103.0 10/31/2014   HDL 37.20* 10/31/2014   LDLDIRECT 116.6 05/20/2007   LDLCALC 67 10/31/2014   ALT 27 10/26/2013   AST 26 10/26/2013   NA 137 08/03/2015   K 4.2 08/03/2015   CL 102 08/03/2015   CREATININE 0.87 08/03/2015   BUN 11 08/03/2015   CO2 28 08/03/2015   TSH 3.73 06/04/2010   PSA 0.20 06/04/2010   INR 1.1 05/10/2008   HGBA1C 8.0 10/17/2015   MICROALBUR <0.7 10/17/2015    Dg Chest 2 View  06/29/2014  CLINICAL DATA:  Cough, congestion, shortness of breath for 3 weeks EXAM: CHEST  2 VIEW COMPARISON:  09/02/2009  FINDINGS: Cardiomediastinal silhouette is unremarkable. No acute infiltrate or pleural effusion. No pulmonary edema. Bony thorax is unremarkable. IMPRESSION: No active cardiopulmonary disease. Electronically Signed   By: Natasha Mead M.D.   On: 06/29/2014 15:36    Assessment & Plan:   There are no diagnoses linked to this encounter. I am having Mr. Bang maintain his aspirin, tadalafil, Vitamin D3, famciclovir, Testosterone Micronized, nebivolol, glucose blood, B-D UF III MINI PEN NEEDLES, fluticasone, promethazine, umeclidinium-vilanterol, glucose blood, amLODipine-valsartan, ketorolac, lovastatin, ONETOUCH DELICA LANCETS 33G, RELION SHORT PEN NEEDLES, morphine, ONE TOUCH ULTRA TEST, Oxymorphone HCl (Crush Resist), Insulin Glargine, and insulin aspart.  No orders of the defined types were placed in this encounter.     Follow-up:  No Follow-up on file.  Walker Kehr, MD

## 2015-10-23 NOTE — Assessment & Plan Note (Signed)
Doing fair 

## 2015-10-25 ENCOUNTER — Telehealth: Payer: Self-pay | Admitting: Internal Medicine

## 2015-10-25 NOTE — Telephone Encounter (Signed)
Please call patient back in regards to morphine PA.  Spouse states that insurance denied morphine because it was sent in as a Capsule and not tablet.  I have explained to her that we sent over tablet.  Then she has stated that we sent PA in for Capsule and it didn't match up to Tablet and that is why PA was denied.  I have spoken with Ferdie Ping and explained to patient that we received a letter from her insurance company that the medication was denied regardless of what form by insurance.  Spouse is trying to get reimbursed for cost out of pocket for morphine.  Patient states her insurance company is telling her something different.  I have suggested that she get her insurance company to give you a call in regards to this.

## 2015-10-30 NOTE — Telephone Encounter (Signed)
Please see letter from [facility] and change RX per their coverage. The letter is in your red folder.

## 2015-10-30 NOTE — Telephone Encounter (Signed)
Pls call her IT consultant or med director and let us know what they would substitute his MS for and at what dose. Thx

## 2015-10-31 MED ORDER — OXYMORPHONE HCL ER 30 MG PO T12A: EXTENDED_RELEASE_TABLET | ORAL | Status: DC

## 2015-10-31 NOTE — Telephone Encounter (Signed)
Ok Thx 

## 2015-10-31 NOTE — Telephone Encounter (Signed)
Per pt's plan he can have up to, but no more than 90 mg of Opana ER per day.  Can you write a different dose to allow him the highest amount covered? Opana ER 30 mg?

## 2015-11-01 NOTE — Telephone Encounter (Signed)
Rx upfront. Left detailed mess informing pt.  

## 2015-11-02 ENCOUNTER — Ambulatory Visit: Payer: BLUE CROSS/BLUE SHIELD | Admitting: Internal Medicine

## 2015-11-06 ENCOUNTER — Telehealth: Payer: Self-pay

## 2015-11-06 NOTE — Telephone Encounter (Signed)
[employer] called and requested a call back regarding a question about a medication.   Number to call back is [phone]  Called number and there was not a note about the nature of the medication message.  I was transferred to the pharmacy (mail order) department for [facility] and they did not have any record of patient at all. This is because the pt does not receive mail order meds.   Nothing was completed due to no reference number left or notes per message.

## 2015-11-06 NOTE — Telephone Encounter (Signed)
[name] [name] called per PA for MS Contin 100 mg 12 hr tablet.  #180 per 30 taken 2 tabs tid  [facility] at [phone]

## 2015-11-06 NOTE — Telephone Encounter (Signed)
Called the prior authorization department at the number below.  Resubmitted PA via phone. PA reference number [id number].   Spoke to [place] (pt spouse).

## 2015-11-08 NOTE — Telephone Encounter (Signed)
Patients wife called back in regards. I gave her an update.  She would like to know how long an appeal usually takes.  She can be reached at [phone].

## 2015-11-08 NOTE — Telephone Encounter (Signed)
PA for MS Contin 100 mg has been denied.   Can you call for an appeal, pls? Fax is in your box.

## 2015-11-09 ENCOUNTER — Other Ambulatory Visit: Payer: Self-pay | Admitting: Internal Medicine

## 2015-11-13 NOTE — Telephone Encounter (Signed)
Pt's spouse called stating pt is having "terrible" breakthrough pain with Opana.  Pt has been using medication with Ibuprofen with no relief.  Spouse is requesting Dr Nash-Finch CompanyPlotnikov call Insurance company to advise of the need for an override of pt's morphine sulfate 100 mg 2 tabs tid (appeal has previously been denied for same)  [phone]

## 2015-11-14 NOTE — Telephone Encounter (Signed)
Will try Thx

## 2015-11-15 NOTE — Telephone Encounter (Signed)
Is there a number for a Wellsite geologistmedical director? Who do I talk to who make decisions? ( I tried #'s below) Thx

## 2015-11-20 NOTE — Telephone Encounter (Signed)
Dr Posey ReaPlotnikov, would please type a letter of necessity to submit on behalf of this pt for an appeal? This must be generated by the physician.  Thanks!  Address: [facility] [address] [address] [place] [id number]

## 2015-11-21 ENCOUNTER — Encounter: Payer: Self-pay | Admitting: Internal Medicine

## 2015-11-21 NOTE — Telephone Encounter (Signed)
Done

## 2015-11-22 ENCOUNTER — Telehealth: Payer: Self-pay | Admitting: Internal Medicine

## 2015-11-22 NOTE — Telephone Encounter (Signed)
Appeals letter mailed

## 2015-11-22 NOTE — Telephone Encounter (Signed)
W[facility] with [facility] this [facility].  States peer to peer conversation has to be prescheduled with [facility] to PA.  [phone]

## 2015-11-23 NOTE — Telephone Encounter (Signed)
Please advise pt's spouse that a letter of medical necessity was generated by Dr Posey ReaPlotnikov and mailed to [facility]pt yesterday (11/22/2015, thanks!

## 2015-11-23 NOTE — Telephone Encounter (Signed)
Left voice mail with response below.

## 2015-11-29 ENCOUNTER — Telehealth: Payer: Self-pay | Admitting: Internal Medicine

## 2015-11-29 NOTE — Telephone Encounter (Signed)
pts wife called regarding [employer] is needing a peer to peer conversation regarding the PA

## 2015-11-29 NOTE — Telephone Encounter (Signed)
error 

## 2015-12-04 NOTE — Telephone Encounter (Signed)
Patients wife called again regarding [employer] needing a peer to peer conversation regarding the PA.  She said  it has been a couple of weeks but she has not heard anything from our office about it.  Now her husband is in need of a prescription for Morphine today because he will run out of it tomorrow. She is waiting for a telephone call back today for instructions on what to do.

## 2015-12-05 ENCOUNTER — Telehealth: Payer: Self-pay | Admitting: *Deleted

## 2015-12-05 NOTE — Telephone Encounter (Signed)
Wife requesting refill on pt Morphine...Raechel Chute/lmb

## 2015-12-05 NOTE — Telephone Encounter (Signed)
Spouse called back and provided fax number to appeal dept [phone].  Letter reprinted and faxed. Also faxed to [employer] [facility] at [phone]

## 2015-12-05 NOTE — Telephone Encounter (Signed)
Patients wife called in regarding PA. Advised of below notes and past convo's. Dr plotnikov called the PA line and was xferred all around. Advised her that we mailed the letter, which she states they didn't get,. i offered to mail it again or fax it if she could get someone's direct fax #

## 2015-12-06 NOTE — Telephone Encounter (Signed)
Pls advise on msg below.../lmb 

## 2015-12-06 NOTE — Telephone Encounter (Signed)
Forwarding msg to [place] for PA...Raechel Chute/lmb

## 2015-12-06 NOTE — Telephone Encounter (Signed)
We car ref Reita ClicheBobby to a [facility] for Rx change. Is it ok to refer? Thx

## 2015-12-06 NOTE — Telephone Encounter (Signed)
Wife says [facility] has not received any letters requesting prior authorization for the morphine since 11/06/15.  The direct fax number for this is [phone].

## 2015-12-06 NOTE — Telephone Encounter (Signed)
Please advise on refill request, thanks!  Spouse advised on several occasions that PA denied x 2 and first attempt at Appeal was denied.  Dr Posey ReaPlotnikov attempted to contact [facility] appeal dept (see phone note dated 11/22/2015) but was unsuccessful.  Dr. Posey ReaPlotnikov generated an appeal letter that was mailed on 11/22/2015.  This letter was also faxed to the Appeal dept number provided by spouse (see phone note dated 12/05/2014) as well as the [employer].  Pt continues to state that [facility] says that they have not received any communication from our office since 11/06/2015. Spouse is however contacting the PA dept not the Appeals Dept and I am unsure if the systems are connected. Spouse advised of same and will attempt to contact Appeals Dept directly.  I re-faxed latter and mail spouse copy of the letter generated by Dr Posey ReaPlotnikov so that she may be able to submit to [facility] as needed. Address verified.

## 2015-12-07 ENCOUNTER — Telehealth: Payer: Self-pay | Admitting: Internal Medicine

## 2015-12-07 MED ORDER — MORPHINE SULFATE 15 MG PO TABS: 15.0000 mg | ORAL_TABLET | Freq: Two times a day (BID) | ORAL | Status: DC | PRN

## 2015-12-07 NOTE — Telephone Encounter (Signed)
Pt's souse called state she request refill for  morphine (MSIR) 15 MG tablet for breakthrough pain a week ago but never heard anything from our office. She said insurance will cover for this. Please help, she is in [place] and hopping to come get this today

## 2015-12-07 NOTE — Telephone Encounter (Signed)
OK to fill this prescription with additional refills x0 Thank you!  

## 2015-12-07 NOTE — Telephone Encounter (Signed)
Printed rx place on cabinet for MD to sign. Raynelle FanningJulie notified pt wife rx ready for p[ick-up.../.lmb

## 2015-12-10 NOTE — Telephone Encounter (Signed)
Spoke with patient's wife at length. Clarified that we have denials from [facility] for both of his meds and have begun the appeal process. This is all we can do at this point and I recommended she follow up with her insurance. Any future calls regarding this subject should be given to Raynelle FanningJulie to avoid further confusion due to the frequency of calls.

## 2015-12-10 NOTE — Telephone Encounter (Signed)
Patient's wife called. Appeal letter has incorrect dosage for MS Contin, documented at BID, should be TID. Will ask Dr. Posey ReaPlotnikov to correct.

## 2015-12-11 NOTE — Telephone Encounter (Signed)
Corrected appeal letter [facility] 12/10/15, patient wife aware and copy mailed to home.

## 2015-12-17 ENCOUNTER — Encounter: Payer: Self-pay | Admitting: Internal Medicine

## 2015-12-18 ENCOUNTER — Ambulatory Visit (INDEPENDENT_AMBULATORY_CARE_PROVIDER_SITE_OTHER): Payer: 59 | Admitting: Endocrinology

## 2015-12-18 ENCOUNTER — Encounter: Payer: Self-pay | Admitting: Endocrinology

## 2015-12-18 VITALS — BP 146/80 | HR 87 | Temp 98.4°F | Ht 70.0 in | Wt 255.0 lb

## 2015-12-18 DIAGNOSIS — E119 Type 2 diabetes mellitus without complications: Secondary | ICD-10-CM | POA: Diagnosis not present

## 2015-12-18 DIAGNOSIS — Z794 Long term (current) use of insulin: Secondary | ICD-10-CM

## 2015-12-18 LAB — POCT GLYCOSYLATED HEMOGLOBIN (HGB A1C): Hemoglobin A1C: 8.4

## 2015-12-18 MED ORDER — INSULIN ASPART 100 UNIT/ML FLEXPEN: 20.0000 [IU] | PEN_INJECTOR | Freq: Every day | SUBCUTANEOUS | Status: DC

## 2015-12-18 MED ORDER — INSULIN GLARGINE 100 UNIT/ML SOLOSTAR PEN: 140.0000 [IU] | PEN_INJECTOR | SUBCUTANEOUS | Status: DC

## 2015-12-18 NOTE — Progress Notes (Signed)
Subjective:    Patient ID: [name] [name], male    DOB: 1966/02/21, 50 y.o.   MRN: [id number]  HPI Pt returns for f/u of diabetes mellitus:  DM type: Insulin-requiring type 2 Dx'ed: 2005 Complications: none Therapy: insulin since 2007 DKA: never Severe hypoglycemia: never Pancreatitis: never.   Other: [name] [name] takes BID insulin, due to h/o noncompliance.   Interval history: [name] [name] takes the basaglar 120 units qd (which [name] [name] says [name] [name] never misses).  However, [name] [name] takes the novolog only prn.  no cbg record, but states cbg's are mildly low approx twice a week, when the evening meal is delayed.  This happens only on workdays (M-F).   Past Medical History  Diagnosis Date  . Diabetes mellitus   . Hyperlipidemia   . Hypertension   . GERD (gastroesophageal reflux disease)   . Lower back pain   . Nephrolithiasis     Dr Annabell HowellsWrenn    No past surgical history on file.  Social History   Social History  . Marital Status: Married    Spouse Name: N/A  . Number of Children: N/A  . Years of Education: N/A   Occupational History  . Not on file.   Social History Main Topics  . Smoking status: Current Every Day Smoker -- 2.00 packs/day    Types: Cigarettes  . Smokeless tobacco: Not on file  . Alcohol Use: No  . Drug Use: No  . Sexual Activity: Yes   Other Topics Concern  . Not on file   Social History Narrative   No regular exercise    Current Outpatient Prescriptions on File Prior to Visit  Medication Sig Dispense Refill  . amLODipine-valsartan (EXFORGE) 10-320 MG tablet TAKE 1 TABLET BY MOUTH DAILY. 90 tablet 1  . aspirin 325 MG tablet Take 325 mg by mouth daily.      . B-D UF III MINI PEN NEEDLES 31G X 5 MM MISC USE AS DIRECTED 100 each 3  . Cholecalciferol (VITAMIN D3) 1000 UNITS CAPS Take 1 capsule by mouth daily.      . fluticasone (FLONASE) 50 MCG/ACT nasal spray Place 2 sprays into both nostrils daily. 42 g 3  . glucose blood (ONE TOUCH ULTRA TEST) test strip USE TWICE DAILY AS  INSTRUCTED Dx code E11.9 100 each 3  . ketorolac (TORADOL) 10 MG tablet Take 1 tablet (10 mg total) by mouth every 8 (eight) hours as needed for severe pain (renal colic). 20 tablet 0  . lovastatin (MEVACOR) 20 MG tablet TAKE 1 TABLET BY MOUTH EVERY DAY 90 tablet 3  . morphine (MSIR) 15 MG tablet Take 1 tablet (15 mg total) by mouth 2 (two) times daily as needed for severe pain. For breakthrough pain  Fill on or after 12/05/15 60 tablet 0  . nebivolol (BYSTOLIC) 10 MG tablet Take 10 mg by mouth daily.    . ONE TOUCH ULTRA TEST test strip USE TWICE DAILY AS INSTRUCTED 100 each 3  . ONETOUCH DELICA LANCETS 33G MISC USE AS DIRECTED 100 each 2  . Oxymorphone HCl, Crush Resist, (OPANA ER, CRUSH RESISTANT,) 30 MG T12A 1 po tid 90 tablet 0  . RELION SHORT PEN NEEDLES 31G X 8 MM MISC USE ONE NEEDLE TO INJECT INSULIN THREE TIMES DAILY 100 each 0  . Umeclidinium-Vilanterol (ANORO ELLIPTA) 62.5-25 MCG/INH AEPB Inhale 1 Act into the lungs daily. 1 each 11   No current facility-administered medications on file prior to visit.    Allergies  Allergen Reactions  .  Belviq [Lorcaserin Hcl]     wierd  . Benazepril Hcl     REACTION: cough  . Nsaids     Can take short term Upset stomach    Family History  Problem Relation Age of Onset  . Heart disease Other     CAD male relative <60  . Hypertension Mother   . Diabetes Mother   . Vision loss Sister     BP 146/80 mmHg  Pulse 87  Temp(Src) 98.4 F (36.9 C) (Oral)  Ht  (1.778 m)  Wt 255 lb (115.667 kg)  BMI 36.59 kg/m2  SpO2 96%   Review of Systems Denies LOC    Objective:   Physical Exam VITAL SIGNS:  See vs page GENERAL: no distress Pulses: dorsalis pedis intact bilat.   MSK: no deformity of the feet CV: no leg edema Skin:  no ulcer on the feet.  normal color and temp on the feet. Neuro: sensation is intact to touch on the feet.     A1c=8.4%    Assessment & Plan:  DM: [name] [name] needs increased rx.     Patient is advised the  following: Patient Instructions  check your blood sugar twice a day.  vary the time of day when you check, between before the 3 meals, and at bedtime.  also check if you have symptoms of your blood sugar being too high or too low.  please keep a record of the readings and bring it to your next appointment here.  You can write it on any piece of paper.  please call us sooner if your blood sugar goes below 70, or if you have a lot of readings over 200. please take basaglar, 100 units each morning on work days (take 140 units on weekends).   Also, please take novolog, 20 units with the evening meal.  Please come back for a follow-up appointment in 3 months.

## 2015-12-18 NOTE — Patient Instructions (Addendum)
check your blood sugar twice a day.  vary the time of day when you check, between before the 3 meals, and at bedtime.  also check if you have symptoms of your blood sugar being too high or too low.  please keep a record of the readings and bring it to your next appointment here.  You can write it on any piece of paper.  please call us sooner if your blood sugar goes below 70, or if you have a lot of readings over 200. please take basaglar, 100 units each morning on work days (take 140 units on weekends).   Also, please take novolog, 20 units with the evening meal.  Please come back for a follow-up appointment in 3 months.

## 2015-12-19 ENCOUNTER — Other Ambulatory Visit: Payer: Self-pay

## 2015-12-19 MED ORDER — INSULIN LISPRO 100 UNIT/ML (KWIKPEN): PEN_INJECTOR | SUBCUTANEOUS | Status: DC

## 2015-12-19 MED ORDER — BASAGLAR KWIKPEN 100 UNIT/ML ~~LOC~~ SOPN: 140.0000 [IU] | PEN_INJECTOR | Freq: Every morning | SUBCUTANEOUS | Status: DC

## 2015-12-24 NOTE — Telephone Encounter (Signed)
Patient wife received a [facility] from [facility] requesting more information for appeal. Patient completed a medical release form for the past 3 years OV's to be forwarded to [employer]/ Appeals, to provide proof of good pain control & quality of life on original medications. Medical record request forwarded to HIM 12/18/15. [employer]/Appeals also requested a letter from Dr. Posey ReaPlotnikov regarding patient's compliance and success with original meds, and failure to control pain with the new meds/dosages approved [facility]. This letter was faxed and mailed to [employer]/Appeals on 12/24/15. Patient wife aware.

## 2016-01-03 ENCOUNTER — Other Ambulatory Visit: Payer: Self-pay | Admitting: Internal Medicine

## 2016-01-03 NOTE — Telephone Encounter (Signed)
Per pt wife, they are requesting the following ( as they are still disputing coverage with [facility] med[facility])  100 mg MS Contin, 2 tablets, 3 times a day Oxymorphone HCI, 2 tabs, 2 times a day ( for breakthrough pain)  Please let her know when it is ready and she will pickup at the office Cell [phone]

## 2016-01-03 NOTE — Telephone Encounter (Signed)
Patient is requesting morphine and break through (higher dose morphine) refill.  Patient will only has enough for tomorrow.

## 2016-01-04 MED ORDER — MORPHINE SULFATE ER 100 MG PO TBCR: 200.0000 mg | EXTENDED_RELEASE_TABLET | Freq: Three times a day (TID) | ORAL | Status: DC

## 2016-01-04 MED ORDER — MORPHINE SULFATE 15 MG PO TABS: 15.0000 mg | ORAL_TABLET | Freq: Every day | ORAL | Status: DC

## 2016-01-04 MED ORDER — MORPHINE SULFATE 15 MG PO TABS: 15.0000 mg | ORAL_TABLET | Freq: Two times a day (BID) | ORAL | Status: DC | PRN

## 2016-01-04 MED ORDER — MORPHINE SULFATE 15 MG PO TABS: 30.0000 mg | ORAL_TABLET | Freq: Two times a day (BID) | ORAL | Status: DC | PRN

## 2016-01-04 NOTE — Addendum Note (Signed)
Addended by: Donnita FallsPOTTER, Chancey Cullinane E on: 01/04/2016 01:38 PM   Modules accepted: Orders

## 2016-01-04 NOTE — Telephone Encounter (Signed)
Correction: 100 mg MS contin, 2 tabs, 3 times a day 15 mg Morphine sulfate, 2 tabs, BID for breakthrough  Per pt wife, we are back to the original meds prior to March when he tried Opana.  [phone] Pt wife will pickup

## 2016-01-04 NOTE — Addendum Note (Signed)
Addended by: Donnita FallsPOTTER, Thorsten Climer E on: 01/04/2016 01:19 PM   Modules accepted: Orders

## 2016-01-04 NOTE — Addendum Note (Signed)
Addended by: Donnita FallsPOTTER, Britzy Graul E on: 01/04/2016 01:24 PM   Modules accepted: Orders

## 2016-01-04 NOTE — Telephone Encounter (Signed)
Per Misty StanleyStacey and PCP corrected sig on this script.

## 2016-01-04 NOTE — Telephone Encounter (Signed)
Rxs printed/upfront. Pt informed by Elray McgregorJulie Johnston, Office manager.

## 2016-01-14 NOTE — Telephone Encounter (Signed)
Per patient wife, [employer] has denied the additional appeal. She plans to continue this with the [employer], [facility] has sent her instructions to do so. Advised her to complete a MR request to get needed records and let me know if we can assist her.

## 2016-01-22 ENCOUNTER — Ambulatory Visit: Payer: 59 | Admitting: Internal Medicine

## 2016-01-23 ENCOUNTER — Encounter: Payer: Self-pay | Admitting: Internal Medicine

## 2016-01-23 ENCOUNTER — Ambulatory Visit (INDEPENDENT_AMBULATORY_CARE_PROVIDER_SITE_OTHER): Payer: 59 | Admitting: Internal Medicine

## 2016-01-23 ENCOUNTER — Other Ambulatory Visit (INDEPENDENT_AMBULATORY_CARE_PROVIDER_SITE_OTHER): Payer: 59

## 2016-01-23 VITALS — BP 130/60 | HR 91 | Wt 255.0 lb

## 2016-01-23 DIAGNOSIS — I10 Essential (primary) hypertension: Secondary | ICD-10-CM

## 2016-01-23 DIAGNOSIS — IMO0002 Reserved for concepts with insufficient information to code with codable children: Secondary | ICD-10-CM

## 2016-01-23 DIAGNOSIS — M5440 Lumbago with sciatica, unspecified side: Secondary | ICD-10-CM | POA: Diagnosis not present

## 2016-01-23 DIAGNOSIS — E1165 Type 2 diabetes mellitus with hyperglycemia: Secondary | ICD-10-CM | POA: Diagnosis not present

## 2016-01-23 DIAGNOSIS — E1121 Type 2 diabetes mellitus with diabetic nephropathy: Secondary | ICD-10-CM

## 2016-01-23 LAB — BASIC METABOLIC PANEL
BUN: 16 mg/dL (ref 6–23)
CALCIUM: 9.7 mg/dL (ref 8.4–10.5)
CO2: 31 mEq/L (ref 19–32)
Chloride: 101 mEq/L (ref 96–112)
Creatinine, Ser: 0.92 mg/dL (ref 0.40–1.50)
GFR: 91.99 mL/min (ref >60.00)
GLUCOSE: 60 mg/dL — AB (ref 70–99)
POTASSIUM: 4.4 meq/L (ref 3.5–5.1)
Sodium: 137 mEq/L (ref 135–145)

## 2016-01-23 LAB — HEPATIC FUNCTION PANEL
ALT: 18 U/L (ref 0–53)
AST: 18 U/L (ref 0–37)
Albumin: 4.3 g/dL (ref 3.5–5.2)
Alkaline Phosphatase: 82 U/L (ref 39–117)
BILIRUBIN DIRECT: 0 mg/dL (ref 0.0–0.3)
BILIRUBIN TOTAL: 0.3 mg/dL (ref 0.2–1.2)
Total Protein: 7.3 g/dL (ref 6.0–8.3)

## 2016-01-23 MED ORDER — UMECLIDINIUM-VILANTEROL 62.5-25 MCG/INH IN AEPB: 1.0000 | INHALATION_SPRAY | Freq: Every day | RESPIRATORY_TRACT | Status: DC

## 2016-01-23 MED ORDER — MORPHINE SULFATE ER 100 MG PO TBCR: 200.0000 mg | EXTENDED_RELEASE_TABLET | Freq: Three times a day (TID) | ORAL | Status: DC

## 2016-01-23 MED ORDER — MORPHINE SULFATE 15 MG PO TABS: 15.0000 mg | ORAL_TABLET | Freq: Two times a day (BID) | ORAL | Status: DC | PRN

## 2016-01-23 NOTE — Assessment & Plan Note (Signed)
Chronic severe LBP, on opioids On MSIR, MSContin (not covered at this dose in 2017 [facility]). Opana ER d/c'd  Potential benefits of a long term opioids  use as well as potential risks  and complications were explained to the patient and were aknowledged.

## 2016-01-23 NOTE — Assessment & Plan Note (Signed)
On Bystolic, Amlod-Valsartan NAS diet 

## 2016-01-23 NOTE — Assessment & Plan Note (Signed)
A1c was 8.4% w/Dr Everardo AllEllison... Lantus Rx

## 2016-01-23 NOTE — Progress Notes (Signed)
Pre visit review using our clinic review tool, if applicable. No additional management support is needed unless otherwise documented below in the visit note. 

## 2016-01-23 NOTE — Progress Notes (Signed)
Subjective:  Patient ID: [name] [name], male    DOB: 1966/08/31  Age: 50 y.o. MRN: [id number]  CC: No chief complaint on file.   HPI [name] [name] presents for chronic pain/LBP - problems w/coverage. F/u HTN, DM. A1c was 8.4% w/[name] [name]...  Outpatient Prescriptions Prior to Visit  Medication Sig Dispense Refill  . amLODipine-valsartan (EXFORGE) 10-320 MG tablet TAKE 1 TABLET BY MOUTH DAILY. 90 tablet 1  . aspirin 325 MG tablet Take 325 mg by mouth daily.      . B-D UF III MINI PEN NEEDLES 31G X 5 MM MISC USE AS DIRECTED 100 each 3  . Cholecalciferol (VITAMIN D3) 1000 UNITS CAPS Take 1 capsule by mouth daily.      . fluticasone (FLONASE) 50 MCG/ACT nasal spray Place 2 sprays into both nostrils daily. 42 g 3  . glucose blood (ONE TOUCH ULTRA TEST) test strip USE TWICE DAILY AS INSTRUCTED Dx code E11.9 100 each 3  . Insulin Glargine (BASAGLAR KWIKPEN) 100 UNIT/ML SOPN Inject 1.4 mLs (140 Units total) into the skin every morning. 15 pen 2  . insulin lispro (HUMALOG KWIKPEN) 100 UNIT/ML KiwkPen Inject 20 units with supper. 15 mL 2  . ketorolac (TORADOL) 10 MG tablet Take 1 tablet (10 mg total) by mouth every 8 (eight) hours as needed for severe pain (renal colic). 20 tablet 0  . lovastatin (MEVACOR) 20 MG tablet TAKE 1 TABLET BY MOUTH EVERY DAY 90 tablet 3  . morphine (MS CONTIN) 100 MG 12 hr tablet Take 2 tablets (200 mg total) by mouth 3 (three) times daily. 180 tablet 0  . morphine (MSIR) 15 MG tablet Take 1 tablet (15 mg total) by mouth 2 (two) times daily as needed for severe pain. For breakthrough pain  Fill on or after 01/04/16 60 tablet 0  . nebivolol (BYSTOLIC) 10 MG tablet Take 10 mg by mouth daily.    . ONE TOUCH ULTRA TEST test strip USE TWICE DAILY AS INSTRUCTED 100 each 3  . ONETOUCH DELICA LANCETS 33G MISC USE AS DIRECTED 100 each 2  . Oxymorphone HCl, Crush Resist, (OPANA ER, CRUSH RESISTANT,) 30 MG T12A 1 po tid 90 tablet 0  . RELION SHORT PEN NEEDLES 31G X 8 MM MISC  USE ONE NEEDLE TO INJECT INSULIN THREE TIMES DAILY 100 each 0  . Umeclidinium-Vilanterol (ANORO ELLIPTA) 62.5-25 MCG/INH AEPB Inhale 1 Act into the lungs daily. 1 each 11   No facility-administered medications prior to visit.    ROS Review of Systems  Constitutional: Negative for appetite change, fatigue and unexpected weight change.  HENT: Negative for congestion, nosebleeds, sneezing, sore throat and trouble swallowing.   Eyes: Negative for itching and visual disturbance.  Respiratory: Negative for cough.   Cardiovascular: Negative for chest pain, palpitations and leg swelling.  Gastrointestinal: Negative for nausea, diarrhea, blood in stool and abdominal distention.  Genitourinary: Negative for frequency and hematuria.  Musculoskeletal: Positive for back pain. Negative for joint swelling, gait problem and neck pain.  Skin: Negative for rash.  Neurological: Negative for dizziness, tremors, speech difficulty and weakness.  Psychiatric/Behavioral: Negative for suicidal ideas, sleep disturbance, dysphoric mood and agitation. The patient is not nervous/anxious.     Objective:  BP 130/60 mmHg  Pulse 91  Wt 255 lb (115.667 kg)  SpO2 97%  BP Readings from Last 3 Encounters:  01/23/16 130/60  12/18/15 146/80  10/23/15 140/80    Wt Readings from Last 3 Encounters:  01/23/16 255 lb (115.667 kg)  12/18/15 255 lb (115.667 kg)  10/23/15 263 lb (119.296 kg)    Physical Exam  Constitutional: He is oriented to person, place, and time. He appears well-developed. No distress.  NAD  HENT:  Mouth/Throat: Oropharynx is clear and moist.  Eyes: Conjunctivae are normal. Pupils are equal, round, and reactive to light.  Neck: Normal range of motion. No JVD present. No thyromegaly present.  Cardiovascular: Normal rate, regular rhythm, normal heart sounds and intact distal pulses.  Exam reveals no gallop and no friction rub.   No murmur heard. Pulmonary/Chest: Effort normal and breath sounds  normal. No respiratory distress. He has no wheezes. He has no rales. He exhibits no tenderness.  Abdominal: Soft. Bowel sounds are normal. He exhibits no distension and no mass. There is no tenderness. There is no rebound and no guarding.  Musculoskeletal: Normal range of motion. He exhibits no edema or tenderness.  Lymphadenopathy:    He has no cervical adenopathy.  Neurological: He is alert and oriented to person, place, and time. He has normal reflexes. No cranial nerve deficit. He exhibits normal muscle tone. He displays a negative Romberg sign. Coordination and gait normal.  Skin: Skin is warm and dry. No rash noted.  Psychiatric: He has a normal mood and affect. His behavior is normal. Judgment and thought content normal.    Lab Results  Component Value Date   WBC 15.8* 10/26/2013   HGB 14.1 10/26/2013   HCT 42.5 10/26/2013   PLT 263.0 10/26/2013   GLUCOSE 94 08/03/2015   CHOL 125 10/31/2014   TRIG 103.0 10/31/2014   HDL 37.20* 10/31/2014   LDLDIRECT 116.6 05/20/2007   LDLCALC 67 10/31/2014   ALT 27 10/26/2013   AST 26 10/26/2013   NA 137 08/03/2015   K 4.2 08/03/2015   CL 102 08/03/2015   CREATININE 0.87 08/03/2015   BUN 11 08/03/2015   CO2 28 08/03/2015   TSH 3.73 06/04/2010   PSA 0.20 06/04/2010   INR 1.1 05/10/2008   HGBA1C 8.4 12/18/2015   MICROALBUR <0.7 10/17/2015    Dg Chest 2 View  06/29/2014  CLINICAL DATA:  Cough, congestion, shortness of breath for 3 weeks EXAM: CHEST  2 VIEW COMPARISON:  09/02/2009 FINDINGS: Cardiomediastinal silhouette is unremarkable. No acute infiltrate or pleural effusion. No pulmonary edema. Bony thorax is unremarkable. IMPRESSION: No active cardiopulmonary disease. Electronically Signed   By: [name] MeadLiviu  Pop M.D.   On: 06/29/2014 15:36    Assessment & Plan:   There are no diagnoses linked to this encounter. I am having [name] [name] maintain his aspirin, Vitamin D3, nebivolol, B-D UF III MINI PEN NEEDLES, fluticasone,  umeclidinium-vilanterol, glucose blood, ketorolac, lovastatin, ONETOUCH DELICA LANCETS 33G, RELION SHORT PEN NEEDLES, ONE TOUCH ULTRA TEST, Oxymorphone HCl (Crush Resist), amLODipine-valsartan, insulin lispro, BASAGLAR KWIKPEN, morphine, and morphine.  No orders of the defined types were placed in this encounter.     Follow-up: No Follow-up on file.  Sonda PrimesAlex Klara Stjames, MD

## 2016-03-10 ENCOUNTER — Other Ambulatory Visit: Payer: Self-pay | Admitting: Endocrinology

## 2016-03-10 NOTE — Telephone Encounter (Signed)
Reorder Basaglar Pen and sent to patient pharmacy.

## 2016-03-18 ENCOUNTER — Ambulatory Visit: Payer: 59 | Admitting: Endocrinology

## 2016-03-25 IMAGING — CR DG CHEST 2V
2 series · 2 of 2 positions shown · non-contrast
Comparison: 09/02/2009

CLINICAL DATA: Cough, congestion, shortness of breath for 3 weeks

EXAM:
CHEST  2 VIEW

[view not recorded (1 of 2)]
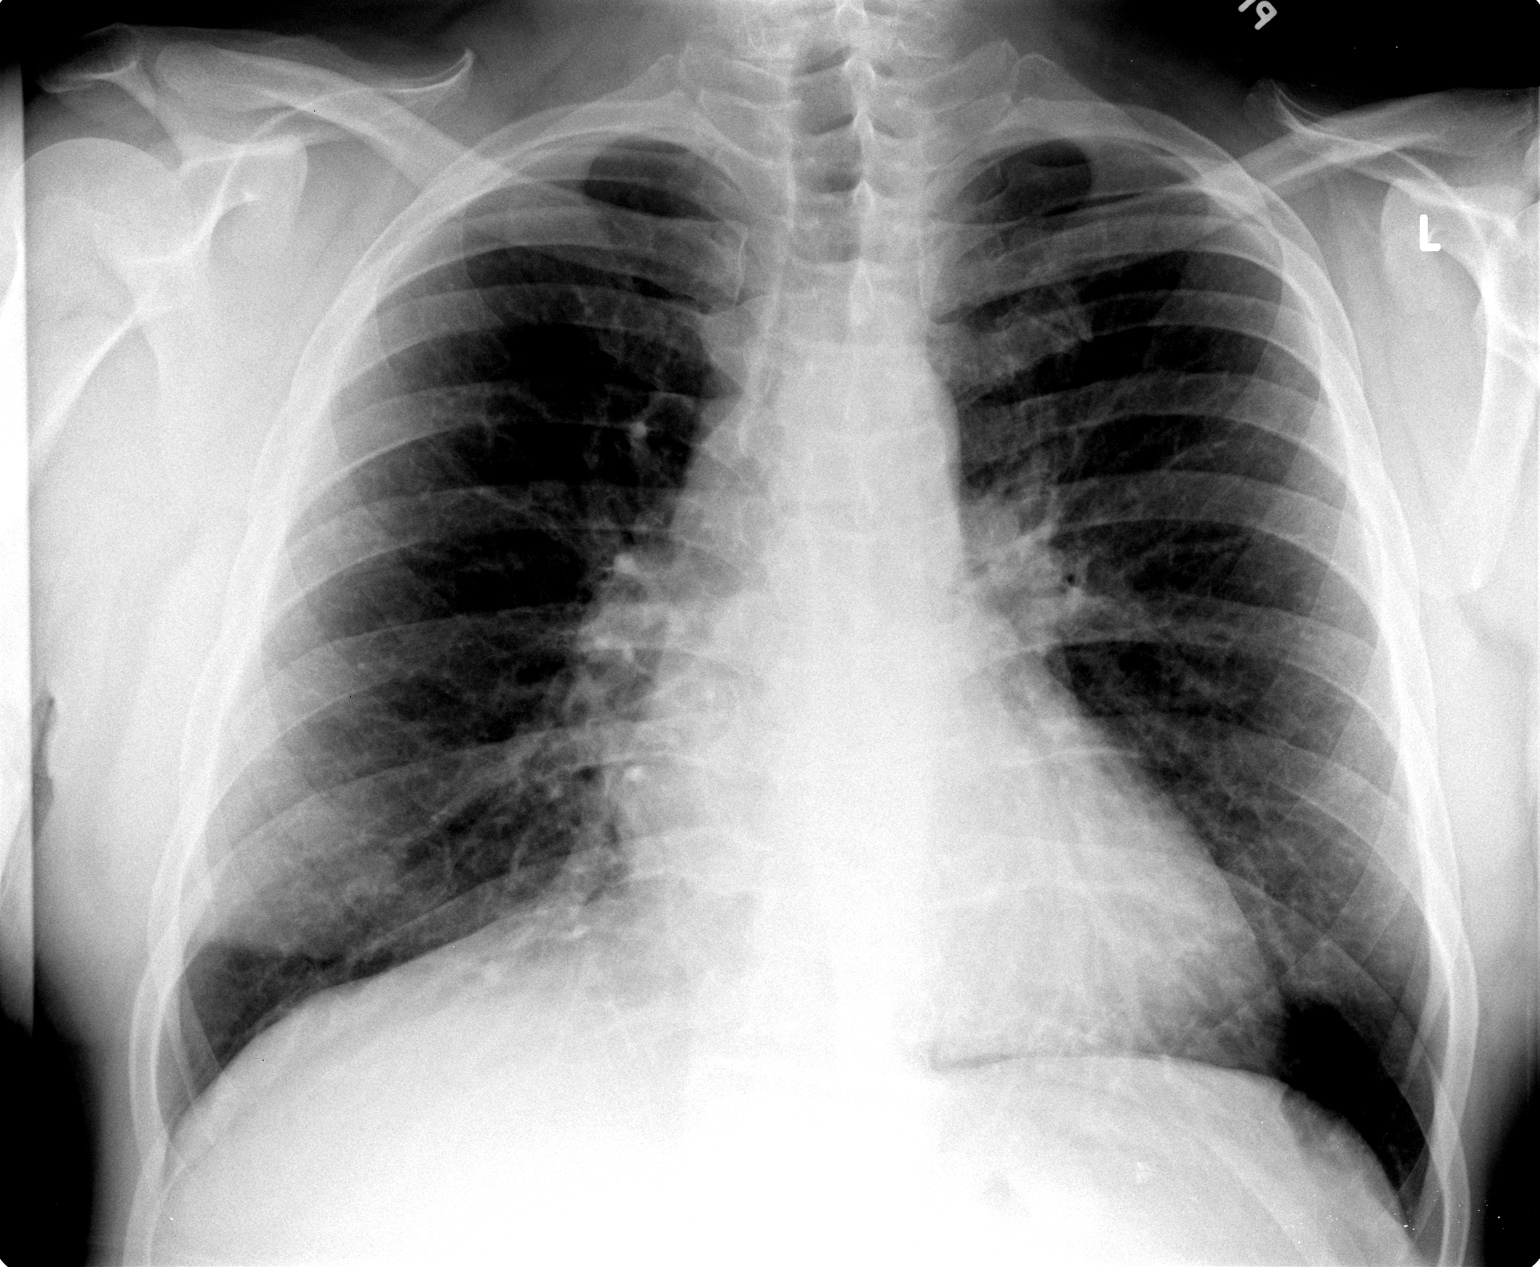

[view not recorded (2 of 2)]
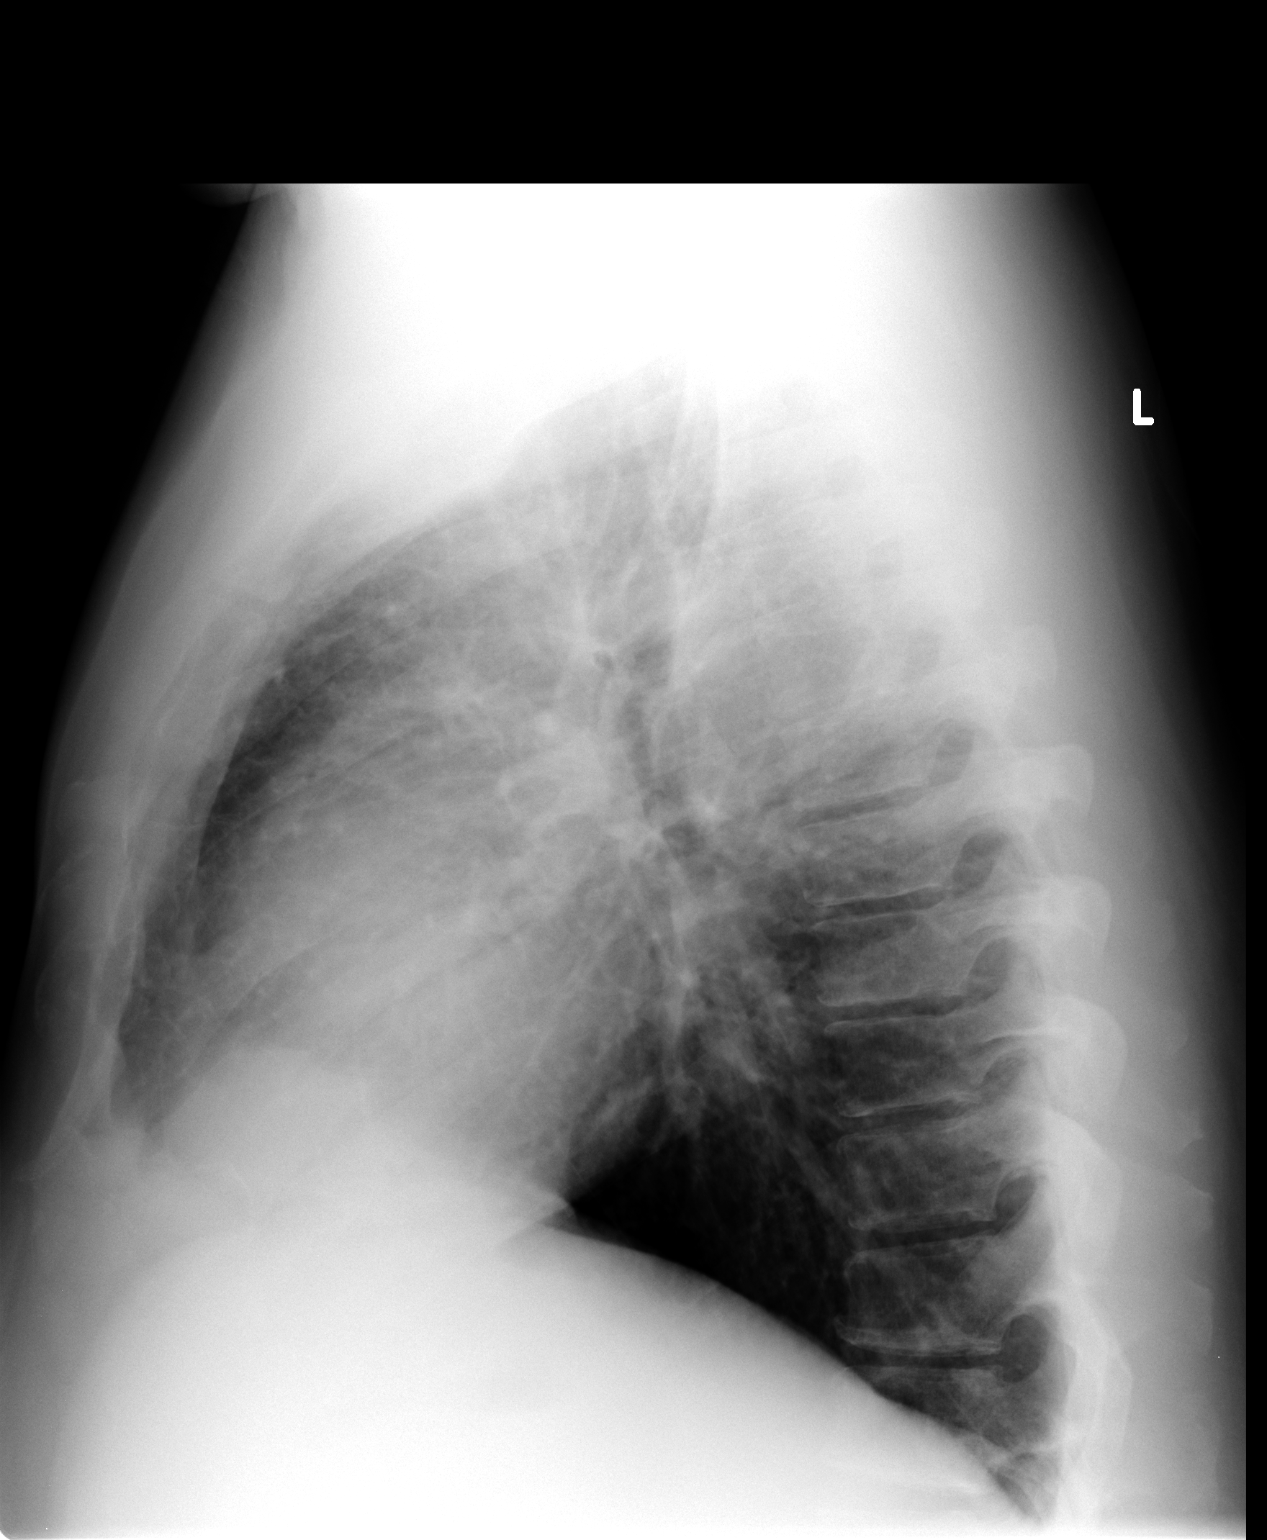

[2 of 2 positions shown; findings below may reference images not displayed]

FINDINGS: Cardiomediastinal silhouette is unremarkable. No acute infiltrate or
pleural effusion. No pulmonary edema. Bony thorax is unremarkable.
IMPRESSION: No active cardiopulmonary disease.

## 2016-03-26 ENCOUNTER — Other Ambulatory Visit: Payer: Self-pay

## 2016-03-26 ENCOUNTER — Other Ambulatory Visit: Payer: Self-pay | Admitting: *Deleted

## 2016-03-26 MED ORDER — LOVASTATIN 20 MG PO TABS: 20.0000 mg | ORAL_TABLET | Freq: Every day | ORAL | 2 refills | Status: DC

## 2016-03-26 MED ORDER — BASAGLAR KWIKPEN 100 UNIT/ML ~~LOC~~ SOPN: PEN_INJECTOR | SUBCUTANEOUS | 3 refills | Status: DC

## 2016-04-10 ENCOUNTER — Other Ambulatory Visit: Payer: Self-pay | Admitting: Internal Medicine

## 2016-04-14 ENCOUNTER — Telehealth: Payer: Self-pay | Admitting: Endocrinology

## 2016-04-14 NOTE — Telephone Encounter (Signed)
Requested a call back from the pt to discuss basaglar insulin dosage. I reviewed the pt's chart and located where his Basaglar had been increase to 140, but could not find a higher dosage than that.

## 2016-04-14 NOTE — Telephone Encounter (Signed)
PT wife stated that PT Basaglar script is not written with the correct amount of insulin, he is running out faster than he should.

## 2016-04-23 ENCOUNTER — Other Ambulatory Visit (INDEPENDENT_AMBULATORY_CARE_PROVIDER_SITE_OTHER): Payer: 59

## 2016-04-23 ENCOUNTER — Encounter: Payer: Self-pay | Admitting: Internal Medicine

## 2016-04-23 ENCOUNTER — Ambulatory Visit (INDEPENDENT_AMBULATORY_CARE_PROVIDER_SITE_OTHER): Payer: 59 | Admitting: Internal Medicine

## 2016-04-23 DIAGNOSIS — Z794 Long term (current) use of insulin: Secondary | ICD-10-CM

## 2016-04-23 DIAGNOSIS — I10 Essential (primary) hypertension: Secondary | ICD-10-CM

## 2016-04-23 DIAGNOSIS — Z23 Encounter for immunization: Secondary | ICD-10-CM | POA: Diagnosis not present

## 2016-04-23 DIAGNOSIS — E1159 Type 2 diabetes mellitus with other circulatory complications: Secondary | ICD-10-CM

## 2016-04-23 LAB — BASIC METABOLIC PANEL
BUN: 16 mg/dL (ref 6–23)
CHLORIDE: 101 meq/L (ref 96–112)
CO2: 30 meq/L (ref 19–32)
CREATININE: 0.9 mg/dL (ref 0.40–1.50)
Calcium: 9.2 mg/dL (ref 8.4–10.5)
GFR: 94.27 mL/min (ref >60.00)
Glucose, Bld: 66 mg/dL — ABNORMAL LOW (ref 70–99)
POTASSIUM: 4.2 meq/L (ref 3.5–5.1)
Sodium: 136 mEq/L (ref 135–145)

## 2016-04-23 LAB — HEMOGLOBIN A1C: HEMOGLOBIN A1C: 8.8 % — AB (ref 4.6–6.5)

## 2016-04-23 MED ORDER — MORPHINE SULFATE 15 MG PO TABS: 15.0000 mg | ORAL_TABLET | Freq: Two times a day (BID) | ORAL | 0 refills | Status: DC | PRN

## 2016-04-23 MED ORDER — MORPHINE SULFATE ER 100 MG PO TBCR: 200.0000 mg | EXTENDED_RELEASE_TABLET | Freq: Three times a day (TID) | ORAL | 0 refills | Status: DC

## 2016-04-23 NOTE — Progress Notes (Signed)
Pre visit review using our clinic review tool, if applicable. No additional management support is needed unless otherwise documented below in the visit note. 

## 2016-04-23 NOTE — Assessment & Plan Note (Signed)
Labs

## 2016-04-23 NOTE — Assessment & Plan Note (Signed)
On Insulin Labs 

## 2016-04-23 NOTE — Assessment & Plan Note (Signed)
On MSIR, MSContin   Potential benefits of a long term opioids  use as well as potential risks  and complications were explained to the patient and were aknowledged. 

## 2016-04-23 NOTE — Progress Notes (Signed)
Subjective:  Patient ID: [name] [name], male    DOB: 03-Jun-1966  Age: 50 y.o. MRN: [id number]  CC: No chief complaint on file.   HPI [name] [name] presents for LBP, DM, HTN f/u  Outpatient Medications Prior to Visit  Medication Sig Dispense Refill  . amLODipine-valsartan (EXFORGE) 10-320 MG tablet TAKE 1 TABLET BY MOUTH DAILY. 90 tablet 1  . aspirin 325 MG tablet Take 325 mg by mouth daily.      . B-D UF III MINI PEN NEEDLES 31G X 5 MM MISC USE AS DIRECTED 100 each 3  . Cholecalciferol (VITAMIN D3) 1000 UNITS CAPS Take 1 capsule by mouth daily.      . fluticasone (FLONASE) 50 MCG/ACT nasal spray Place 2 sprays into both nostrils daily. 42 g 3  . glucose blood (ONE TOUCH ULTRA TEST) test strip USE TWICE DAILY AS INSTRUCTED Dx code E11.9 100 each 3  . Insulin Glargine (BASAGLAR KWIKPEN) 100 UNIT/ML SOPN INJECT 1.4 MLS (140 UNITS TOTAL) INTO THE SKIN EVERY MORNING. 15 pen 3  . insulin lispro (HUMALOG KWIKPEN) 100 UNIT/ML KiwkPen Inject 20 units with supper. 15 mL 2  . ketorolac (TORADOL) 10 MG tablet Take 1 tablet (10 mg total) by mouth every 8 (eight) hours as needed for severe pain (renal colic). 20 tablet 0  . lovastatin (MEVACOR) 20 MG tablet Take 1 tablet (20 mg total) by mouth daily. 90 tablet 2  . morphine (MS CONTIN) 100 MG 12 hr tablet Take 2 tablets (200 mg total) by mouth 3 (three) times daily. 180 tablet 0  . morphine (MSIR) 15 MG tablet Take 1 tablet (15 mg total) by mouth 2 (two) times daily as needed for severe pain. For breakthrough pain  Fill on or after 04/05/16 60 tablet 0  . nebivolol (BYSTOLIC) 10 MG tablet Take 10 mg by mouth daily.    . ONE TOUCH ULTRA TEST test strip USE TWICE DAILY AS INSTRUCTED 100 each 3  . ONETOUCH DELICA LANCETS 33G MISC Use to check blood sugars twice a day Dx E11.9 100 each 2  . RELION SHORT PEN NEEDLES 31G X 8 MM MISC USE ONE NEEDLE TO INJECT INSULIN THREE TIMES DAILY 100 each 0  . umeclidinium-vilanterol (ANORO ELLIPTA) 62.5-25  MCG/INH AEPB Inhale 1 puff into the lungs daily. 1 each 11   No facility-administered medications prior to visit.     ROS Review of Systems  Constitutional: Negative for appetite change, fatigue and unexpected weight change.  HENT: Negative for congestion, nosebleeds, sneezing, sore throat and trouble swallowing.   Eyes: Negative for itching and visual disturbance.  Respiratory: Negative for cough.   Cardiovascular: Negative for chest pain, palpitations and leg swelling.  Gastrointestinal: Negative for abdominal distention, blood in stool, diarrhea and nausea.  Genitourinary: Negative for frequency and hematuria.  Musculoskeletal: Positive for back pain. Negative for gait problem, joint swelling and neck pain.  Skin: Negative for rash.  Neurological: Negative for dizziness, tremors, speech difficulty, weakness and light-headedness.  Psychiatric/Behavioral: Negative for agitation, dysphoric mood and sleep disturbance. The patient is nervous/anxious.     Objective:  BP 120/60   Pulse 78   Wt 259 lb (117.5 kg)   SpO2 95%   BMI 37.16 kg/m   BP Readings from Last 3 Encounters:  04/23/16 120/60  01/23/16 130/60  12/18/15 (!) 146/80    Wt Readings from Last 3 Encounters:  04/23/16 259 lb (117.5 kg)  01/23/16 255 lb (115.7 kg)  12/18/15 255 lb (115.7  kg)    Physical Exam  Constitutional: He is oriented to person, place, and time. He appears well-developed. No distress.  NAD  HENT:  Mouth/Throat: Oropharynx is clear and moist.  Eyes: Conjunctivae are normal. Pupils are equal, round, and reactive to light.  Neck: Normal range of motion. No JVD present. No thyromegaly present.  Cardiovascular: Normal rate, regular rhythm, normal heart sounds and intact distal pulses.  Exam reveals no gallop and no friction rub.   No murmur heard. Pulmonary/Chest: Effort normal and breath sounds normal. No respiratory distress. He has no wheezes. He has no rales. He exhibits no tenderness.    Abdominal: Soft. Bowel sounds are normal. He exhibits no distension and no mass. There is no tenderness. There is no rebound and no guarding.  Musculoskeletal: Normal range of motion. He exhibits no edema or tenderness.  Lymphadenopathy:    He has no cervical adenopathy.  Neurological: He is alert and oriented to person, place, and time. He has normal reflexes. No cranial nerve deficit. He exhibits normal muscle tone. He displays a negative Romberg sign. Coordination and gait normal.  Skin: Skin is warm and dry. No rash noted.  Psychiatric: He has a normal mood and affect. His behavior is normal. Judgment and thought content normal.    Lab Results  Component Value Date   WBC 15.8 (H) 10/26/2013   HGB 14.1 10/26/2013   HCT 42.5 10/26/2013   PLT 263.0 10/26/2013   GLUCOSE 60 (L) 01/23/2016   CHOL 125 10/31/2014   TRIG 103.0 10/31/2014   HDL 37.20 (L) 10/31/2014   LDLDIRECT 116.6 05/20/2007   LDLCALC 67 10/31/2014   ALT 18 01/23/2016   AST 18 01/23/2016   NA 137 01/23/2016   K 4.4 01/23/2016   CL 101 01/23/2016   CREATININE 0.92 01/23/2016   BUN 16 01/23/2016   CO2 31 01/23/2016   TSH 3.73 06/04/2010   PSA 0.20 06/04/2010   INR 1.1 05/10/2008   HGBA1C 8.4 12/18/2015   MICROALBUR <0.7 10/17/2015    Dg Chest 2 View  Result Date: 06/29/2014 CLINICAL DATA:  Cough, congestion, shortness of breath for 3 weeks EXAM: CHEST  2 VIEW COMPARISON:  09/02/2009 FINDINGS: Cardiomediastinal silhouette is unremarkable. No acute infiltrate or pleural effusion. No pulmonary edema. Bony thorax is unremarkable. IMPRESSION: No active cardiopulmonary disease. Electronically Signed   By: Natasha MeadLiviu  Pop M.D.   On: 06/29/2014 15:36    Assessment & Plan:   There are no diagnoses linked to this encounter. I am having Mr. Vanvorst maintain his aspirin, Vitamin D3, nebivolol, B-D UF III MINI PEN NEEDLES, fluticasone, glucose blood, ketorolac, RELION SHORT PEN NEEDLES, ONE TOUCH ULTRA TEST,  amLODipine-valsartan, insulin lispro, morphine, morphine, umeclidinium-vilanterol, lovastatin, BASAGLAR KWIKPEN, and ONETOUCH DELICA LANCETS 33G.  No orders of the defined types were placed in this encounter.    Follow-up: No Follow-up on file.  Sonda PrimesAlex Hadasah Brugger, MD

## 2016-04-25 ENCOUNTER — Other Ambulatory Visit: Payer: Self-pay

## 2016-04-25 MED ORDER — BASAGLAR KWIKPEN 100 UNIT/ML ~~LOC~~ SOPN: PEN_INJECTOR | SUBCUTANEOUS | 2 refills | Status: DC

## 2016-06-12 ENCOUNTER — Encounter: Payer: Self-pay | Admitting: Gastroenterology

## 2016-07-22 ENCOUNTER — Ambulatory Visit (INDEPENDENT_AMBULATORY_CARE_PROVIDER_SITE_OTHER): Payer: 59 | Admitting: Internal Medicine

## 2016-07-22 ENCOUNTER — Other Ambulatory Visit (INDEPENDENT_AMBULATORY_CARE_PROVIDER_SITE_OTHER): Payer: 59

## 2016-07-22 ENCOUNTER — Encounter: Payer: Self-pay | Admitting: Internal Medicine

## 2016-07-22 VITALS — BP 130/90 | HR 85 | Wt 262.0 lb

## 2016-07-22 DIAGNOSIS — E1165 Type 2 diabetes mellitus with hyperglycemia: Secondary | ICD-10-CM

## 2016-07-22 DIAGNOSIS — Z91199 Patient's noncompliance with other medical treatment and regimen due to unspecified reason: Secondary | ICD-10-CM | POA: Insufficient documentation

## 2016-07-22 DIAGNOSIS — Z9119 Patient's noncompliance with other medical treatment and regimen: Secondary | ICD-10-CM | POA: Diagnosis not present

## 2016-07-22 DIAGNOSIS — J441 Chronic obstructive pulmonary disease with (acute) exacerbation: Secondary | ICD-10-CM

## 2016-07-22 DIAGNOSIS — Z23 Encounter for immunization: Secondary | ICD-10-CM | POA: Diagnosis not present

## 2016-07-22 DIAGNOSIS — E1121 Type 2 diabetes mellitus with diabetic nephropathy: Secondary | ICD-10-CM | POA: Diagnosis not present

## 2016-07-22 DIAGNOSIS — IMO0002 Reserved for concepts with insufficient information to code with codable children: Secondary | ICD-10-CM

## 2016-07-22 LAB — BASIC METABOLIC PANEL
BUN: 11 mg/dL (ref 6–23)
CALCIUM: 9.5 mg/dL (ref 8.4–10.5)
CHLORIDE: 97 meq/L (ref 96–112)
CO2: 30 meq/L (ref 19–32)
CREATININE: 0.91 mg/dL (ref 0.40–1.50)
GFR: 92.98 mL/min (ref >60.00)
GLUCOSE: 278 mg/dL — AB (ref 70–99)
Potassium: 4.3 mEq/L (ref 3.5–5.1)
Sodium: 133 mEq/L — ABNORMAL LOW (ref 135–145)

## 2016-07-22 LAB — HEMOGLOBIN A1C: Hgb A1c MFr Bld: 8.9 % — ABNORMAL HIGH (ref 4.6–6.5)

## 2016-07-22 MED ORDER — MORPHINE SULFATE ER 100 MG PO TBCR: 200.0000 mg | EXTENDED_RELEASE_TABLET | Freq: Three times a day (TID) | ORAL | 0 refills | Status: DC

## 2016-07-22 MED ORDER — MORPHINE SULFATE 15 MG PO TABS: 15.0000 mg | ORAL_TABLET | Freq: Two times a day (BID) | ORAL | 0 refills | Status: DC | PRN

## 2016-07-22 MED ORDER — FLUTICASONE PROPIONATE 50 MCG/ACT NA SUSP: 2.0000 | Freq: Every day | NASAL | 3 refills | Status: DC

## 2016-07-22 MED ORDER — MORPHINE SULFATE ER 100 MG PO TBCR
200.0000 mg | EXTENDED_RELEASE_TABLET | Freq: Three times a day (TID) | ORAL | 0 refills | Status: DC
Start: 2016-07-22 — End: 2016-07-22

## 2016-07-22 NOTE — Addendum Note (Signed)
Addended by: Merrilyn PumaSIMMONS, Teagen Mcleary N on: 07/22/2016 04:57 PM   Modules accepted: Orders

## 2016-07-22 NOTE — Assessment & Plan Note (Signed)
Chronic. 

## 2016-07-22 NOTE — Progress Notes (Signed)
Subjective:  Patient ID: [name] [name], male    DOB: 1965-12-18  Age: 50 y.o. MRN: [id number]  CC: No chief complaint on file.   HPI [name] [name] presents for HTN, DM, LBP f/u  Outpatient Medications Prior to Visit  Medication Sig Dispense Refill  . amLODipine-valsartan (EXFORGE) 10-320 MG tablet TAKE 1 TABLET BY MOUTH DAILY. 90 tablet 1  . B-D UF III MINI PEN NEEDLES 31G X 5 MM MISC USE AS DIRECTED 100 each 3  . Cholecalciferol (VITAMIN D3) 1000 UNITS CAPS Take 1 capsule by mouth daily.      . fluticasone (FLONASE) 50 MCG/ACT nasal spray Place 2 sprays into both nostrils daily. 42 g 3  . glucose blood (ONE TOUCH ULTRA TEST) test strip USE TWICE DAILY AS INSTRUCTED Dx code E11.9 100 each 3  . Insulin Glargine (BASAGLAR KWIKPEN) 100 UNIT/ML SOPN INJECT 100 UNITS INTO THE SKIN EVERY MORNING. 45 mL 2  . insulin lispro (HUMALOG KWIKPEN) 100 UNIT/ML KiwkPen Inject 20 units with supper. 15 mL 2  . ketorolac (TORADOL) 10 MG tablet Take 1 tablet (10 mg total) by mouth every 8 (eight) hours as needed for severe pain (renal colic). 20 tablet 0  . lovastatin (MEVACOR) 20 MG tablet Take 1 tablet (20 mg total) by mouth daily. 90 tablet 2  . morphine (MS CONTIN) 100 MG 12 hr tablet Take 2 tablets (200 mg total) by mouth 3 (three) times daily. 180 tablet 0  . morphine (MSIR) 15 MG tablet Take 1 tablet (15 mg total) by mouth 2 (two) times daily as needed for severe pain. 60 tablet 0  . nebivolol (BYSTOLIC) 10 MG tablet Take 10 mg by mouth daily.    . ONE TOUCH ULTRA TEST test strip USE TWICE DAILY AS INSTRUCTED 100 each 3  . ONETOUCH DELICA LANCETS 33G MISC Use to check blood sugars twice a day Dx E11.9 100 each 2  . RELION SHORT PEN NEEDLES 31G X 8 MM MISC USE ONE NEEDLE TO INJECT INSULIN THREE TIMES DAILY 100 each 0  . umeclidinium-vilanterol (ANORO ELLIPTA) 62.5-25 MCG/INH AEPB Inhale 1 puff into the lungs daily. 1 each 11  . aspirin 325 MG tablet Take 325 mg by mouth daily.       No  facility-administered medications prior to visit.     ROS Review of Systems  Constitutional: Negative for appetite change, fatigue and unexpected weight change.  HENT: Negative for congestion, nosebleeds, sneezing, sore throat and trouble swallowing.   Eyes: Negative for itching and visual disturbance.  Respiratory: Negative for cough.   Cardiovascular: Negative for chest pain, palpitations and leg swelling.  Gastrointestinal: Negative for abdominal distention, blood in stool, diarrhea and nausea.  Genitourinary: Negative for frequency and hematuria.  Musculoskeletal: Positive for back pain. Negative for gait problem, joint swelling and neck pain.  Skin: Negative for rash.  Neurological: Negative for dizziness, tremors, speech difficulty and weakness.  Psychiatric/Behavioral: Negative for agitation, dysphoric mood, sleep disturbance and suicidal ideas. The patient is not nervous/anxious.     Objective:  BP 130/90   Pulse 85   Wt 262 lb (118.8 kg)   SpO2 95%   BMI 37.59 kg/m   BP Readings from Last 3 Encounters:  07/22/16 130/90  04/23/16 120/60  01/23/16 130/60    Wt Readings from Last 3 Encounters:  07/22/16 262 lb (118.8 kg)  04/23/16 259 lb (117.5 kg)  01/23/16 255 lb (115.7 kg)    Physical Exam  Constitutional: He is oriented  to person, place, and time. He appears well-developed. No distress.  NAD  HENT:  Mouth/Throat: Oropharynx is clear and moist.  Eyes: Conjunctivae are normal. Pupils are equal, round, and reactive to light.  Neck: Normal range of motion. No JVD present. No thyromegaly present.  Cardiovascular: Normal rate, regular rhythm, normal heart sounds and intact distal pulses.  Exam reveals no gallop and no friction rub.   No murmur heard. Pulmonary/Chest: Effort normal and breath sounds normal. No respiratory distress. He has no wheezes. He has no rales. He exhibits no tenderness.  Abdominal: Soft. Bowel sounds are normal. He exhibits no distension and  no mass. There is no tenderness. There is no rebound and no guarding.  Musculoskeletal: Normal range of motion. He exhibits tenderness. He exhibits no edema.  Lymphadenopathy:    He has no cervical adenopathy.  Neurological: He is alert and oriented to person, place, and time. He has normal reflexes. No cranial nerve deficit. He exhibits normal muscle tone. He displays a negative Romberg sign. Coordination and gait normal.  Skin: Skin is warm and dry. No rash noted.  Psychiatric: He has a normal mood and affect. His behavior is normal. Judgment and thought content normal.  Obese LS tender  Lab Results  Component Value Date   WBC 15.8 (H) 10/26/2013   HGB 14.1 10/26/2013   HCT 42.5 10/26/2013   PLT 263.0 10/26/2013   GLUCOSE 66 (L) 04/23/2016   CHOL 125 10/31/2014   TRIG 103.0 10/31/2014   HDL 37.20 (L) 10/31/2014   LDLDIRECT 116.6 05/20/2007   LDLCALC 67 10/31/2014   ALT 18 01/23/2016   AST 18 01/23/2016   NA 136 04/23/2016   K 4.2 04/23/2016   CL 101 04/23/2016   CREATININE 0.90 04/23/2016   BUN 16 04/23/2016   CO2 30 04/23/2016   TSH 3.73 06/04/2010   PSA 0.20 06/04/2010   INR 1.1 05/10/2008   HGBA1C 8.8 (H) 04/23/2016   MICROALBUR <0.7 10/17/2015    Dg Chest 2 View  Result Date: 06/29/2014 CLINICAL DATA:  Cough, congestion, shortness of breath for 3 weeks EXAM: CHEST  2 VIEW COMPARISON:  09/02/2009 FINDINGS: Cardiomediastinal silhouette is unremarkable. No acute infiltrate or pleural effusion. No pulmonary edema. Bony thorax is unremarkable. IMPRESSION: No active cardiopulmonary disease. Electronically Signed   By: Natasha MeadLiviu  Pop M.D.   On: 06/29/2014 15:36    Assessment & Plan:   There are no diagnoses linked to this encounter. I have discontinued [name] [name]'s aspirin. I am also having him maintain his Vitamin D3, nebivolol, B-D UF III MINI PEN NEEDLES, fluticasone, glucose blood, ketorolac, RELION SHORT PEN NEEDLES, ONE TOUCH ULTRA TEST, amLODipine-valsartan, insulin  lispro, umeclidinium-vilanterol, lovastatin, ONETOUCH DELICA LANCETS 33G, morphine, morphine, BASAGLAR KWIKPEN, and aspirin EC.  Meds ordered this encounter  Medications  . aspirin EC 81 MG tablet    Sig: Take 81 mg by mouth daily.     Follow-up: No Follow-up on file.  Sonda PrimesAlex Plotnikov, MD

## 2016-07-22 NOTE — Addendum Note (Signed)
Addended by: Merrilyn PumaSIMMONS, Ulah Olmo N on: 07/22/2016 05:04 PM   Modules accepted: Orders

## 2016-07-22 NOTE — Progress Notes (Signed)
Pre visit review using our clinic review tool, if applicable. No additional management support is needed unless otherwise documented below in the visit note. 

## 2016-07-22 NOTE — Assessment & Plan Note (Signed)
Risks (death, MI, CVA, vision loss etc) associated with treatment and diet noncompliance were discussed. Compliance was encouraged.

## 2016-08-04 ENCOUNTER — Other Ambulatory Visit: Payer: Self-pay | Admitting: Internal Medicine

## 2016-09-22 ENCOUNTER — Telehealth: Payer: Self-pay | Admitting: Internal Medicine

## 2016-09-22 NOTE — Telephone Encounter (Signed)
Pt wife, [name], is calling about prior auth for patients Morphine Sulfate. States she called about a week ago and has not heard anything from us. [employer] had originally told her that the meds were authorized through July 2018 but when she tried to fill the January RX, it came back requiring a prior [place].

## 2016-09-23 NOTE — Telephone Encounter (Signed)
I have never received a PA request from pt's pharmacy. I called pharmacy. I have printed a PA form and have started it.

## 2016-09-24 NOTE — Telephone Encounter (Signed)
I completed what I could on the PA form for Morphine and gave the form to PCP for completion/MD signature on 09/23/16.  Once PCP completes/signs and returns form back to me, I will fax it back.  Pt's wife informed.

## 2016-09-26 ENCOUNTER — Other Ambulatory Visit: Payer: Self-pay | Admitting: Internal Medicine

## 2016-09-26 NOTE — Telephone Encounter (Signed)
Form is in PCP's red folder for review and completion.

## 2016-09-27 ENCOUNTER — Other Ambulatory Visit: Payer: Self-pay | Admitting: Endocrinology

## 2016-09-28 NOTE — Telephone Encounter (Signed)
Please refill x 1 Ov is due  

## 2016-10-22 ENCOUNTER — Encounter: Payer: Self-pay | Admitting: Internal Medicine

## 2016-10-22 ENCOUNTER — Other Ambulatory Visit (INDEPENDENT_AMBULATORY_CARE_PROVIDER_SITE_OTHER): Payer: 59

## 2016-10-22 ENCOUNTER — Ambulatory Visit (INDEPENDENT_AMBULATORY_CARE_PROVIDER_SITE_OTHER): Payer: 59 | Admitting: Internal Medicine

## 2016-10-22 DIAGNOSIS — E1121 Type 2 diabetes mellitus with diabetic nephropathy: Secondary | ICD-10-CM | POA: Diagnosis not present

## 2016-10-22 DIAGNOSIS — E1165 Type 2 diabetes mellitus with hyperglycemia: Secondary | ICD-10-CM

## 2016-10-22 DIAGNOSIS — IMO0002 Reserved for concepts with insufficient information to code with codable children: Secondary | ICD-10-CM

## 2016-10-22 DIAGNOSIS — M545 Low back pain, unspecified: Secondary | ICD-10-CM

## 2016-10-22 DIAGNOSIS — Z6837 Body mass index (BMI) 37.0-37.9, adult: Secondary | ICD-10-CM

## 2016-10-22 DIAGNOSIS — IMO0001 Reserved for inherently not codable concepts without codable children: Secondary | ICD-10-CM

## 2016-10-22 DIAGNOSIS — E6609 Other obesity due to excess calories: Secondary | ICD-10-CM

## 2016-10-22 DIAGNOSIS — M79605 Pain in left leg: Secondary | ICD-10-CM

## 2016-10-22 LAB — BASIC METABOLIC PANEL
BUN: 15 mg/dL (ref 6–23)
CALCIUM: 9.2 mg/dL (ref 8.4–10.5)
CHLORIDE: 102 meq/L (ref 96–112)
CO2: 28 meq/L (ref 19–32)
CREATININE: 0.94 mg/dL (ref 0.40–1.50)
GFR: 89.48 mL/min (ref >60.00)
GLUCOSE: 143 mg/dL — AB (ref 70–99)
Potassium: 4.1 mEq/L (ref 3.5–5.1)
SODIUM: 133 meq/L — AB (ref 135–145)

## 2016-10-22 LAB — HEPATIC FUNCTION PANEL
ALT: 21 U/L (ref 0–53)
AST: 19 U/L (ref 0–37)
Albumin: 4.2 g/dL (ref 3.5–5.2)
Alkaline Phosphatase: 85 U/L (ref 39–117)
BILIRUBIN DIRECT: 0.1 mg/dL (ref 0.0–0.3)
TOTAL PROTEIN: 7.2 g/dL (ref 6.0–8.3)
Total Bilirubin: 0.3 mg/dL (ref 0.2–1.2)

## 2016-10-22 LAB — HEMOGLOBIN A1C: Hgb A1c MFr Bld: 10.2 % — ABNORMAL HIGH (ref 4.6–6.5)

## 2016-10-22 MED ORDER — MORPHINE SULFATE ER 100 MG PO TBCR: 200.0000 mg | EXTENDED_RELEASE_TABLET | Freq: Three times a day (TID) | ORAL | 0 refills | Status: DC

## 2016-10-22 MED ORDER — MORPHINE SULFATE 15 MG PO TABS: 15.0000 mg | ORAL_TABLET | Freq: Two times a day (BID) | ORAL | 0 refills | Status: DC | PRN

## 2016-10-22 NOTE — Progress Notes (Signed)
Pre-visit discussion using our clinic review tool. No additional management support is needed unless otherwise documented below in the visit note.  

## 2016-10-22 NOTE — Assessment & Plan Note (Signed)
Wt Readings from Last 3 Encounters:  10/22/16 249 lb 8 oz (113.2 kg)  07/22/16 262 lb (118.8 kg)  04/23/16 259 lb (117.5 kg)

## 2016-10-22 NOTE — Assessment & Plan Note (Signed)
Cont w/MSIR, MSContin   Potential benefits of a long term opioids  use as well as potential risks  and complications were explained to the patient and were aknowledged.

## 2016-10-22 NOTE — Assessment & Plan Note (Signed)
Doing fair 

## 2016-10-22 NOTE — Assessment & Plan Note (Signed)
On Lantus Poor compliance Risks associated with treatment noncompliance were discussed. Compliance was encouraged. 

## 2016-10-22 NOTE — Progress Notes (Signed)
Subjective:  Patient ID: [name] [name], male    DOB: 11-17-1965  Age: 51 y.o. MRN: [id number]  CC: Follow-up (HTN, DM type 2, Back pain )   HPI [name] presents for LBP, DM, HTN f/u  Outpatient Medications Prior to Visit  Medication Sig Dispense Refill  . amLODipine-valsartan (EXFORGE) 10-320 MG tablet TAKE 1 TABLET BY MOUTH DAILY. 90 tablet 3  . aspirin EC 81 MG tablet Take 81 mg by mouth daily.    . B-D UF III MINI PEN NEEDLES 31G X 5 MM MISC USE AS DIRECTED 100 each 3  . Cholecalciferol (VITAMIN D3) 1000 UNITS CAPS Take 1 capsule by mouth daily.      . fluticasone (FLONASE) 50 MCG/ACT nasal spray Place 2 sprays into both nostrils daily. 42 g 3  . glucose blood (ONE TOUCH ULTRA TEST) test strip USE TWICE DAILY AS INSTRUCTED Dx code E11.9 100 each 3  . glucose blood (ONE TOUCH ULTRA TEST) test strip Use to check blood sugars twice a day. Dx E11.9 100 each 3  . Insulin Glargine (BASAGLAR KWIKPEN) 100 UNIT/ML SOPN INJECT 100 UNITS INTO THE SKIN EVERY MORNING. 45 mL 0  . insulin lispro (HUMALOG KWIKPEN) 100 UNIT/ML KiwkPen Inject 20 units with supper. 15 mL 2  . ketorolac (TORADOL) 10 MG tablet Take 1 tablet (10 mg total) by mouth every 8 (eight) hours as needed for severe pain (renal colic). 20 tablet 0  . lovastatin (MEVACOR) 20 MG tablet Take 1 tablet (20 mg total) by mouth daily. 90 tablet 2  . morphine (MS CONTIN) 100 MG 12 hr tablet Take 2 tablets (200 mg total) by mouth 3 (three) times daily. 180 tablet 0  . morphine (MSIR) 15 MG tablet Take 1 tablet (15 mg total) by mouth 2 (two) times daily as needed for severe pain. 60 tablet 0  . nebivolol (BYSTOLIC) 10 MG tablet Take 10 mg by mouth daily.    [name] DELICA LANCETS 33G MISC Use to check blood sugars twice a day Dx E11.9 100 each 2  . RELION SHORT PEN NEEDLES 31G X 8 MM MISC USE ONE NEEDLE TO INJECT INSULIN THREE TIMES DAILY 100 each 0  . umeclidinium-vilanterol (ANORO ELLIPTA) 62.5-25 MCG/INH AEPB Inhale 1 puff  into the lungs daily. 1 each 11   No facility-administered medications prior to visit.     ROS Review of Systems  Constitutional: Negative for appetite change, fatigue and unexpected weight change.  HENT: Negative for congestion, nosebleeds, sneezing, sore throat and trouble swallowing.   Eyes: Negative for itching and visual disturbance.  Respiratory: Negative for cough.   Cardiovascular: Negative for chest pain, palpitations and leg swelling.  Gastrointestinal: Negative for abdominal distention, blood in stool, diarrhea and nausea.  Genitourinary: Negative for frequency and hematuria.  Musculoskeletal: Positive for back pain. Negative for gait problem, joint swelling and neck pain.  Skin: Negative for rash.  Neurological: Negative for dizziness, tremors, speech difficulty and weakness.  Psychiatric/Behavioral: Negative for agitation, dysphoric mood, sleep disturbance and suicidal ideas. The patient is not nervous/anxious.     Objective:  Pulse 94   Temp 98.3 F (36.8 C) (Oral)   Resp 16   Ht 5\' 10"  (1.778 m)   Wt 249 lb 8 oz (113.2 kg)   SpO2 97%   BMI 35.80 kg/m   BP Readings from Last 3 Encounters:  07/22/16 130/90  04/23/16 120/60  01/23/16 130/60    Wt Readings from Last 3 Encounters:  10/22/16  249 lb 8 oz (113.2 kg)  07/22/16 262 lb (118.8 kg)  04/23/16 259 lb (117.5 kg)    Physical Exam  Constitutional: He is oriented to person, place, and time. He appears well-developed. No distress.  NAD  HENT:  Mouth/Throat: Oropharynx is clear and moist.  Eyes: Conjunctivae are normal. Pupils are equal, round, and reactive to light.  Neck: Normal range of motion. No JVD present. No thyromegaly present.  Cardiovascular: Normal rate, regular rhythm, normal heart sounds and intact distal pulses.  Exam reveals no gallop and no friction rub.   No murmur heard. Pulmonary/Chest: Effort normal and breath sounds normal. No respiratory distress. He has no wheezes. He has no  rales. He exhibits no tenderness.  Abdominal: Soft. Bowel sounds are normal. He exhibits no distension and no mass. There is no tenderness. There is no rebound and no guarding.  Musculoskeletal: Normal range of motion. He exhibits tenderness. He exhibits no edema.  Lymphadenopathy:    He has no cervical adenopathy.  Neurological: He is alert and oriented to person, place, and time. He has normal reflexes. No cranial nerve deficit. He exhibits normal muscle tone. He displays a negative Romberg sign. Coordination and gait normal.  Skin: Skin is warm and dry. No rash noted.  Psychiatric: He has a normal mood and affect. His behavior is normal. Judgment and thought content normal.  LS painful w/ROM  Lab Results  Component Value Date   WBC 15.8 (H) 10/26/2013   HGB 14.1 10/26/2013   HCT 42.5 10/26/2013   PLT 263.0 10/26/2013   GLUCOSE 278 (H) 07/22/2016   CHOL 125 10/31/2014   TRIG 103.0 10/31/2014   HDL 37.20 (L) 10/31/2014   LDLDIRECT 116.6 05/20/2007   LDLCALC 67 10/31/2014   ALT 18 01/23/2016   AST 18 01/23/2016   NA 133 (L) 07/22/2016   K 4.3 07/22/2016   CL 97 07/22/2016   CREATININE 0.91 07/22/2016   BUN 11 07/22/2016   CO2 30 07/22/2016   TSH 3.73 06/04/2010   PSA 0.20 06/04/2010   INR 1.1 05/10/2008   HGBA1C 8.9 (H) 07/22/2016   MICROALBUR <0.7 10/17/2015    Dg Chest 2 View  Result Date: 06/29/2014 CLINICAL DATA:  Cough, congestion, shortness of breath for 3 weeks EXAM: CHEST  2 VIEW COMPARISON:  09/02/2009 FINDINGS: Cardiomediastinal silhouette is unremarkable. No acute infiltrate or pleural effusion. No pulmonary edema. Bony thorax is unremarkable. IMPRESSION: No active cardiopulmonary disease. Electronically Signed   By: Natasha MeadLiviu  Pop M.D.   On: 06/29/2014 15:36    Assessment & Plan:   There are no diagnoses linked to this encounter. I am having Mr. Rumple maintain his Vitamin D3, nebivolol, B-D UF III MINI PEN NEEDLES, glucose blood, ketorolac, RELION SHORT PEN  NEEDLES, insulin lispro, umeclidinium-vilanterol, lovastatin, ONETOUCH DELICA LANCETS 33G, aspirin EC, fluticasone, morphine, morphine, amLODipine-valsartan, glucose blood, and BASAGLAR KWIKPEN.  No orders of the defined types were placed in this encounter.    Follow-up: No Follow-up on file.  Sonda PrimesAlex Lamoyne Palencia, MD

## 2016-10-27 NOTE — Telephone Encounter (Signed)
Pt called again about us do a PA for Jan for this med.  They paid out of pocket for the med in jan.  She needs us to do it so they can get get there money back from the ins compy for it

## 2016-10-31 ENCOUNTER — Telehealth: Payer: Self-pay

## 2016-10-31 NOTE — Telephone Encounter (Signed)
error 

## 2016-10-31 NOTE — Telephone Encounter (Signed)
Closing note 

## 2016-10-31 NOTE — Telephone Encounter (Signed)
Spoke to pt spouse and she is needing the medication to be approved for January. Called [employer] and was able to find out that the pharmacy submitted January's claim for #630 instead of #180. That was the reason for the denial. Informed pt spouse Gavin Pound(Deborah) of same and advised to do a manual reimbursement with pt insurance company. [name] stated understanding.   [facility] [place] and gave same information. Kim Product/process development scientist(Pharmacy Supervisor) stated that there is no way they would submit a claim for more that what the rx was written for. Did not debate with Selena BattenKim regarding what they did or did not do. Informed Selena BattenKim that pt and/or pt spouse would need information regarding what they paid out of pocket.

## 2016-11-03 ENCOUNTER — Telehealth: Payer: Self-pay | Admitting: Internal Medicine

## 2016-11-03 NOTE — Telephone Encounter (Signed)
Pt's wife called and said that insurance is now requiring a pa for morphine (MSIR) 15 MG tablet. She said that she has been working with you on the other pa and was wanting to speak with you. Thanks [employer]

## 2016-11-04 NOTE — Telephone Encounter (Signed)
PA started via covermymeds. Key[id number]

## 2016-11-13 NOTE — Telephone Encounter (Signed)
LVM for pt to call back as soon as possible.   RE: I will call the insurance company to start the appeal on Friday.   PA was denied.

## 2016-11-14 NOTE — Telephone Encounter (Signed)
Patients wife has called back.  States she has [facility] stating medication was covered but at the same time she is asking for PA.  Please follow back up with at [phone].

## 2016-11-18 NOTE — Telephone Encounter (Signed)
Tried to call back. No voicemail to leave a message.

## 2016-11-20 NOTE — Telephone Encounter (Signed)
[facility] [phone]) and spoke to a prior authorization representative. She was able to see that the pharmacy submitted a claim for 360 tablets of the MSIR 15 mg tablets on 11/03/2016. Rep also stated the MSIR 15 mg tab is a covered medication and other then the amount there should not have been an issue. The rep confirmed that there was no other claim for the written amount of #60 for the same rx.   Called pt spouse and she stated that the had to pay out of pocket for the #60 MSIR 15 mg tablet. I informed of the claim denial due to the amount that was submitted.   Spouse is contacting the insurance company for a reimbursement form.

## 2016-11-26 ENCOUNTER — Encounter: Payer: Self-pay | Admitting: Endocrinology

## 2016-11-26 ENCOUNTER — Ambulatory Visit (INDEPENDENT_AMBULATORY_CARE_PROVIDER_SITE_OTHER): Payer: 59 | Admitting: Endocrinology

## 2016-11-26 ENCOUNTER — Other Ambulatory Visit: Payer: Self-pay | Admitting: Endocrinology

## 2016-11-26 VITALS — BP 136/82 | HR 73 | Temp 98.5°F | Ht 70.0 in | Wt 251.0 lb

## 2016-11-26 DIAGNOSIS — E1121 Type 2 diabetes mellitus with diabetic nephropathy: Secondary | ICD-10-CM

## 2016-11-26 DIAGNOSIS — Z794 Long term (current) use of insulin: Secondary | ICD-10-CM | POA: Diagnosis not present

## 2016-11-26 DIAGNOSIS — IMO0002 Reserved for concepts with insufficient information to code with codable children: Secondary | ICD-10-CM

## 2016-11-26 DIAGNOSIS — E1165 Type 2 diabetes mellitus with hyperglycemia: Secondary | ICD-10-CM

## 2016-11-26 NOTE — Progress Notes (Signed)
Subjective:    Patient ID: [name] [name], male    DOB: Feb 02, 1966, 51 y.o.   MRN: [id number]  HPI Pt returns for f/u of diabetes mellitus:  DM type: Insulin-requiring type 2 Dx'ed: 2005 Complications: none Therapy: insulin since 2007 DKA: never Severe hypoglycemia: never Pancreatitis: never.   Other: he takes 2 QD insulins, due to h/o noncompliance; he works M-F, as a Nutritional therapist.   Interval history: he says he misses the insulin approx 2-3 doses per week.  no cbg record, but states cbg's are mildly low approx twice a week, fasting or in the afterrnoon.  This happens only on workdays.  He does not take the advised extra insulin on weekends.  Past Medical History:  Diagnosis Date  . Diabetes mellitus   . GERD (gastroesophageal reflux disease)   . Hyperlipidemia   . Hypertension   . Lower back pain   . Nephrolithiasis    Dr Annabell Howells    No past surgical history on file.  Social History   Social History  . Marital status: Married    Spouse name: N/A  . Number of children: N/A  . Years of education: N/A   Occupational History  . Not on file.   Social History Main Topics  . Smoking status: Current Every Day Smoker    Packs/day: 2.00    Types: Cigarettes  . Smokeless tobacco: Never Used  . Alcohol use No  . Drug use: No  . Sexual activity: Yes   Other Topics Concern  . Not on file   Social History Narrative   No regular exercise    Current Outpatient Prescriptions on File Prior to Visit  Medication Sig Dispense Refill  . amLODipine-valsartan (EXFORGE) 10-320 MG tablet TAKE 1 TABLET BY MOUTH DAILY. 90 tablet 3  . aspirin EC 81 MG tablet Take 81 mg by mouth daily.    . B-D UF III MINI PEN NEEDLES 31G X 5 MM MISC USE AS DIRECTED 100 each 3  . Cholecalciferol (VITAMIN D3) 1000 UNITS CAPS Take 1 capsule by mouth daily.      . fluticasone (FLONASE) 50 MCG/ACT nasal spray Place 2 sprays into both nostrils daily. 42 g 3  . glucose blood (ONE TOUCH ULTRA TEST) test strip  USE TWICE DAILY AS INSTRUCTED Dx code E11.9 100 each 3  . glucose blood (ONE TOUCH ULTRA TEST) test strip Use to check blood sugars twice a day. Dx E11.9 100 each 3  . insulin lispro (HUMALOG KWIKPEN) 100 UNIT/ML KiwkPen Inject 20 units with supper. 15 mL 2  . ketorolac (TORADOL) 10 MG tablet Take 1 tablet (10 mg total) by mouth every 8 (eight) hours as needed for severe pain (renal colic). 20 tablet 0  . lovastatin (MEVACOR) 20 MG tablet Take 1 tablet (20 mg total) by mouth daily. 90 tablet 2  . morphine (MS CONTIN) 100 MG 12 hr tablet Take 2 tablets (200 mg total) by mouth 3 (three) times daily. 180 tablet 0  . morphine (MSIR) 15 MG tablet Take 1 tablet (15 mg total) by mouth 2 (two) times daily as needed for severe pain. 60 tablet 0  . nebivolol (BYSTOLIC) 10 MG tablet Take 10 mg by mouth daily.    [name] DELICA LANCETS 33G MISC Use to check blood sugars twice a day Dx E11.9 100 each 2  . RELION SHORT PEN NEEDLES 31G X 8 MM MISC USE ONE NEEDLE TO INJECT INSULIN THREE TIMES DAILY 100 each 0  . umeclidinium-vilanterol (  ANORO ELLIPTA) 62.5-25 MCG/INH AEPB Inhale 1 puff into the lungs daily. 1 each 11   No current facility-administered medications on file prior to visit.     Allergies  Allergen Reactions  . Belviq [Lorcaserin Hcl]     wierd  . Benazepril Hcl     REACTION: cough  . Nsaids     Can take short term Upset stomach    Family History  Problem Relation Age of Onset  . Hypertension Mother   . Diabetes Mother   . Heart disease Other     CAD male relative <60  . Vision loss Sister     BP 136/82   Pulse 73   Temp 98.5 F (36.9 C) (Oral)   Ht 5\' 10"  (1.778 m)   Wt 251 lb (113.9 kg)   SpO2 93%   BMI 36.01 kg/m    Review of Systems Denies LOC.     Objective:   Physical Exam VITAL SIGNS:  See vs page GENERAL: no distress Pulses: dorsalis pedis intact bilat.   MSK: no deformity of the feet CV: no leg edema Skin:  no ulcer on the feet.  normal color and  temp on the feet.  Neuro: sensation is intact to touch on the feet.    Lab Results  Component Value Date   HGBA1C 10.2 (H) 10/22/2016      Assessment & Plan:  Insulin-requiring type 2 DM: ongoing poor control. Noncompliance with cbg recording, insulin, and f/u: I'll work around this as best I can. Obesity: persistent.  Patient Instructions  check your blood sugar twice a day.  vary the time of day when you check, between before the 3 meals, and at bedtime.  also check if you have symptoms of your blood sugar being too high or too low.  please keep a record of the readings and bring it to your next appointment here.  You can write it on any piece of paper.  please call us sooner if your blood sugar goes below 70, or if you have a lot of readings over 200. A diabetes blood test is requested for you today.  We'll let you know about the results.   please take basaglar, 100 units each morning on work days (take 140 units on weekends).   Also, please take novolog, 20 units with the evening meal.  Please come back for a follow-up appointment in 2 months.      Bariatric Surgery You have so much to gain by losing weight.  You may have already tried every diet and exercise plan imaginable.  And, you may have sought advice from your family physician, too.   Sometimes, in spite of such diligent efforts, you may not be able to achieve long-term results by yourself.  In cases of severe obesity, bariatric or weight loss surgery is a proven method of achieving long-term weight control.  Our Services Our bariatric surgery programs offer our patients new hope and long-term weight-loss solution.  Since introducing our services in 2003, we have conducted more than 2,400 successful procedures.  Our program is designated as a Investment banker, corporateComprehensive Center by the Metabolic and Bariatric Surgery Accreditation and Quality Improvement Program (MBSAQIP), a Child psychotherapistnational accrediting body that sets rigorous patient safety and  outcome standards.  Our program is also designated as a Engineer, manufacturing systemsCenter of Excellence by [employer] insurance companies.   Our exceptional weight-loss surgery team specializes in diagnosis, treatment, follow-up care, and ongoing support for our patients with severe weight loss challenges.  We  currently offer laparoscopic sleeve gastrectomy, gastric bypass, and adjustable gastric band (LAP-BAND).    Attend our Bariatrics Seminar Choosing to undergo a bariatric procedure is a big decision, and one that should not be taken lightly.  You now have two options in how you learn about weight-loss surgery - in person or online.  Our objective is to ensure you have all of the information that you need to evaluate the advantages and obligations of this life changing procedure.  Please note that you are not alone in this process, and our experienced team is ready to assist and answer all of your questions.  There are several ways to register for a seminar (either on-line or in person): 1)  Call [phone] 2) Go on-line to [facility] and register for either type of seminar.  [email or web address]

## 2016-11-26 NOTE — Patient Instructions (Addendum)
check your blood sugar twice a day.  vary the time of day when you check, between before the 3 meals, and at bedtime.  also check if you have symptoms of your blood sugar being too high or too low.  please keep a record of the readings and bring it to your next appointment here.  You can write it on any piece of paper.  please call us sooner if your blood sugar goes below 70, or if you have a lot of readings over 200. A diabetes blood test is requested for you today.  We'll let you know about the results.   please take basaglar, 100 units each morning on work days (take 140 units on weekends).   Also, please take novolog, 20 units with the evening meal.  Please come back for a follow-up appointment in 2 months.      Bariatric Surgery You have so much to gain by losing weight.  You may have already tried every diet and exercise plan imaginable.  And, you may have sought advice from your family physician, too.   Sometimes, in spite of such diligent efforts, you may not be able to achieve long-term results by yourself.  In cases of severe obesity, bariatric or weight loss surgery is a proven method of achieving long-term weight control.  Our Services Our bariatric surgery programs offer our patients new hope and long-term weight-loss solution.  Since introducing our services in 2003, we have conducted more than 2,400 successful procedures.  Our program is designated as a Investment banker, corporateComprehensive Center by the Metabolic and Bariatric Surgery Accreditation and Quality Improvement Program (MBSAQIP), a Child psychotherapistnational accrediting body that sets rigorous patient safety and outcome standards.  Our program is also designated as a Engineer, manufacturing systemsCenter of Excellence by [employer] insurance companies.   Our exceptional weight-loss surgery team specializes in diagnosis, treatment, follow-up care, and ongoing support for our patients with severe weight loss challenges.  We currently offer laparoscopic sleeve gastrectomy, gastric bypass, and adjustable  gastric band (LAP-BAND).    Attend our Bariatrics Seminar Choosing to undergo a bariatric procedure is a big decision, and one that should not be taken lightly.  You now have two options in how you learn about weight-loss surgery - in person or online.  Our objective is to ensure you have all of the information that you need to evaluate the advantages and obligations of this life changing procedure.  Please note that you are not alone in this process, and our experienced team is ready to assist and answer all of your questions.  There are several ways to register for a seminar (either on-line or in person): 1)  Call [phone] 2) Go on-line to [facility] and register for either type of seminar.  [email or web address]

## 2016-11-28 LAB — FRUCTOSAMINE: FRUCTOSAMINE: 341 umol/L — AB (ref 190–270)

## 2016-12-03 DIAGNOSIS — H04123 Dry eye syndrome of bilateral lacrimal glands: Secondary | ICD-10-CM | POA: Diagnosis not present

## 2016-12-03 DIAGNOSIS — H10413 Chronic giant papillary conjunctivitis, bilateral: Secondary | ICD-10-CM | POA: Diagnosis not present

## 2016-12-03 DIAGNOSIS — E119 Type 2 diabetes mellitus without complications: Secondary | ICD-10-CM | POA: Diagnosis not present

## 2016-12-27 ENCOUNTER — Other Ambulatory Visit: Payer: Self-pay | Admitting: Internal Medicine

## 2017-01-06 ENCOUNTER — Telehealth: Payer: Self-pay | Admitting: Internal Medicine

## 2017-01-06 NOTE — Telephone Encounter (Signed)
Pt needs PA for morphine (MS CONTIN) 100 MG 12 hr tablet    PA needs to be put in for 5/5 for Pt to get reimbursement. Please make good for one year.  [facility] [address] [facility] [place]

## 2017-01-08 NOTE — Telephone Encounter (Signed)
Pa started 

## 2017-01-12 NOTE — Telephone Encounter (Signed)
Pt wife called in for the status of the PA ??

## 2017-01-12 NOTE — Telephone Encounter (Signed)
Wife called and states the right form was not requested    Morphine Equivalent Dose PA Form. Is what it needs to be or if you want to give verbal call...Marland Kitchen.Marland Kitchen.  [phone]

## 2017-01-13 NOTE — Telephone Encounter (Signed)
Pt started (ref #: [id number]) and pt wife notified

## 2017-01-16 NOTE — Telephone Encounter (Signed)
PA approved, pharmacy and pts wife notified

## 2017-01-19 ENCOUNTER — Encounter: Payer: Self-pay | Admitting: Internal Medicine

## 2017-01-19 ENCOUNTER — Ambulatory Visit (INDEPENDENT_AMBULATORY_CARE_PROVIDER_SITE_OTHER): Payer: 59 | Admitting: Internal Medicine

## 2017-01-19 VITALS — BP 126/70 | HR 72 | Temp 98.3°F | Ht 70.0 in | Wt 246.0 lb

## 2017-01-19 DIAGNOSIS — I10 Essential (primary) hypertension: Secondary | ICD-10-CM | POA: Diagnosis not present

## 2017-01-19 DIAGNOSIS — M79604 Pain in right leg: Secondary | ICD-10-CM

## 2017-01-19 DIAGNOSIS — M79605 Pain in left leg: Secondary | ICD-10-CM

## 2017-01-19 DIAGNOSIS — E1165 Type 2 diabetes mellitus with hyperglycemia: Secondary | ICD-10-CM | POA: Diagnosis not present

## 2017-01-19 DIAGNOSIS — M545 Low back pain: Secondary | ICD-10-CM

## 2017-01-19 DIAGNOSIS — Z794 Long term (current) use of insulin: Secondary | ICD-10-CM

## 2017-01-19 DIAGNOSIS — Z0000 Encounter for general adult medical examination without abnormal findings: Secondary | ICD-10-CM

## 2017-01-19 DIAGNOSIS — E785 Hyperlipidemia, unspecified: Secondary | ICD-10-CM | POA: Diagnosis not present

## 2017-01-19 DIAGNOSIS — E1121 Type 2 diabetes mellitus with diabetic nephropathy: Secondary | ICD-10-CM

## 2017-01-19 DIAGNOSIS — IMO0002 Reserved for concepts with insufficient information to code with codable children: Secondary | ICD-10-CM

## 2017-01-19 MED ORDER — MORPHINE SULFATE 15 MG PO TABS: 15.0000 mg | ORAL_TABLET | Freq: Two times a day (BID) | ORAL | 0 refills | Status: DC | PRN

## 2017-01-19 MED ORDER — MORPHINE SULFATE 15 MG PO TABS
15.0000 mg | ORAL_TABLET | Freq: Two times a day (BID) | ORAL | 0 refills | Status: DC | PRN
Start: 2017-01-19 — End: 2017-01-19

## 2017-01-19 MED ORDER — MORPHINE SULFATE ER 100 MG PO TBCR: 200.0000 mg | EXTENDED_RELEASE_TABLET | Freq: Three times a day (TID) | ORAL | 0 refills | Status: DC

## 2017-01-19 NOTE — Assessment & Plan Note (Signed)
On Bystolic, Amlod-Valsartan NAS diet Wt loss 

## 2017-01-19 NOTE — Assessment & Plan Note (Signed)
Declined statins. 

## 2017-01-19 NOTE — Assessment & Plan Note (Signed)
On Lantus 

## 2017-01-19 NOTE — Patient Instructions (Signed)
Shingrix

## 2017-01-19 NOTE — Progress Notes (Signed)
Subjective:  Patient ID: [name] [name], male    DOB: 1965-10-04  Age: 51 y.o. MRN: [id number]  CC: No chief complaint on file.   HPI [name] [name] presents for chronic LBP, HTN and DM 2 f/u  Outpatient Medications Prior to Visit  Medication Sig Dispense Refill  . amLODipine-valsartan (EXFORGE) 10-320 MG tablet TAKE 1 TABLET BY MOUTH DAILY. 90 tablet 3  . aspirin EC 81 MG tablet Take 81 mg by mouth daily.    . B-D UF III MINI PEN NEEDLES 31G X 5 MM MISC USE AS DIRECTED 100 each 3  . Cholecalciferol (VITAMIN D3) 1000 UNITS CAPS Take 1 capsule by mouth daily.      . fluticasone (FLONASE) 50 MCG/ACT nasal spray Place 2 sprays into both nostrils daily. 42 g 3  . glucose blood (ONE TOUCH ULTRA TEST) test strip USE TWICE DAILY AS INSTRUCTED Dx code E11.9 100 each 3  . glucose blood (ONE TOUCH ULTRA TEST) test strip Use to check blood sugars twice a day. Dx E11.9 100 each 3  . Insulin Glargine (BASAGLAR KWIKPEN) 100 UNIT/ML SOPN INJECT 100 UNITS INTO THE SKIN EVERY MORNING. 45 pen 0  . insulin lispro (HUMALOG KWIKPEN) 100 UNIT/ML KiwkPen Inject 20 units with supper. 15 mL 2  . ketorolac (TORADOL) 10 MG tablet Take 1 tablet (10 mg total) by mouth every 8 (eight) hours as needed for severe pain (renal colic). 20 tablet 0  . lovastatin (MEVACOR) 20 MG tablet TAKE 1 TABLET (20 MG TOTAL) BY MOUTH DAILY. 90 tablet 1  . morphine (MS CONTIN) 100 MG 12 hr tablet Take 2 tablets (200 mg total) by mouth 3 (three) times daily. 180 tablet 0  . morphine (MSIR) 15 MG tablet Take 1 tablet (15 mg total) by mouth 2 (two) times daily as needed for severe pain. 60 tablet 0  . nebivolol (BYSTOLIC) 10 MG tablet Take 10 mg by mouth daily.    [name]. ONETOUCH DELICA LANCETS 33G MISC Use to check blood sugars twice a day Dx E11.9 100 each 2  . RELION SHORT PEN NEEDLES 31G X 8 MM MISC USE ONE NEEDLE TO INJECT INSULIN THREE TIMES DAILY 100 each 0  . umeclidinium-vilanterol (ANORO ELLIPTA) 62.5-25 MCG/INH AEPB Inhale 1  puff into the lungs daily. 1 each 11   No facility-administered medications prior to visit.     ROS Review of Systems  Constitutional: Negative for appetite change, fatigue and unexpected weight change.  HENT: Negative for congestion, nosebleeds, sneezing, sore throat and trouble swallowing.   Eyes: Negative for itching and visual disturbance.  Respiratory: Negative for cough.   Cardiovascular: Negative for chest pain, palpitations and leg swelling.  Gastrointestinal: Negative for abdominal distention, blood in stool, diarrhea and nausea.  Genitourinary: Negative for frequency and hematuria.  Musculoskeletal: Positive for back pain. Negative for gait problem, joint swelling and neck pain.  Skin: Negative for rash.  Neurological: Negative for dizziness, tremors, speech difficulty and weakness.  Psychiatric/Behavioral: Negative for agitation, dysphoric mood, sleep disturbance and suicidal ideas. The patient is not nervous/anxious.     Objective:  BP 126/70 (BP Location: Left Arm, Patient Position: Sitting, Cuff Size: Large)   Pulse 72   Temp 98.3 F (36.8 C) (Oral)   Ht 5\' 10"  (1.778 m)   Wt 246 lb (111.6 kg)   SpO2 98%   BMI 35.30 kg/m   BP Readings from Last 3 Encounters:  01/19/17 126/70  11/26/16 136/82  07/22/16 130/90  Wt Readings from Last 3 Encounters:  01/19/17 246 lb (111.6 kg)  11/26/16 251 lb (113.9 kg)  10/22/16 249 lb 8 oz (113.2 kg)    Physical Exam  Constitutional: He is oriented to person, place, and time. He appears well-developed. No distress.  NAD  HENT:  Mouth/Throat: Oropharynx is clear and moist.  Eyes: Conjunctivae are normal. Pupils are equal, round, and reactive to light.  Neck: Normal range of motion. No JVD present. No thyromegaly present.  Cardiovascular: Normal rate, regular rhythm, normal heart sounds and intact distal pulses.  Exam reveals no gallop and no friction rub.   No murmur heard. Pulmonary/Chest: Effort normal and breath  sounds normal. No respiratory distress. He has no wheezes. He has no rales. He exhibits no tenderness.  Abdominal: Soft. Bowel sounds are normal. He exhibits no distension and no mass. There is no tenderness. There is no rebound and no guarding.  Musculoskeletal: Normal range of motion. He exhibits tenderness. He exhibits no edema.  Lymphadenopathy:    He has no cervical adenopathy.  Neurological: He is alert and oriented to person, place, and time. He has normal reflexes. No cranial nerve deficit. He exhibits normal muscle tone. He displays a negative Romberg sign. Coordination and gait normal.  Skin: Skin is warm and dry. No rash noted.  Psychiatric: He has a normal mood and affect. His behavior is normal. Judgment and thought content normal.  LS tender  Lab Results  Component Value Date   WBC 15.8 (H) 10/26/2013   HGB 14.1 10/26/2013   HCT 42.5 10/26/2013   PLT 263.0 10/26/2013   GLUCOSE 143 (H) 10/22/2016   CHOL 125 10/31/2014   TRIG 103.0 10/31/2014   HDL 37.20 (L) 10/31/2014   LDLDIRECT 116.6 05/20/2007   LDLCALC 67 10/31/2014   ALT 21 10/22/2016   AST 19 10/22/2016   NA 133 (L) 10/22/2016   K 4.1 10/22/2016   CL 102 10/22/2016   CREATININE 0.94 10/22/2016   BUN 15 10/22/2016   CO2 28 10/22/2016   TSH 3.73 06/04/2010   PSA 0.20 06/04/2010   INR 1.1 05/10/2008   HGBA1C 10.2 (H) 10/22/2016   MICROALBUR <0.7 10/17/2015    Dg Chest 2 View  Result Date: 06/29/2014 CLINICAL DATA:  Cough, congestion, shortness of breath for 3 weeks EXAM: CHEST  2 VIEW COMPARISON:  09/02/2009 FINDINGS: Cardiomediastinal silhouette is unremarkable. No acute infiltrate or pleural effusion. No pulmonary edema. Bony thorax is unremarkable. IMPRESSION: No active cardiopulmonary disease. Electronically Signed   By: Natasha Mead M.D.   On: 06/29/2014 15:36    Assessment & Plan:   There are no diagnoses linked to this encounter. I am having [name] [name] maintain his Vitamin D3, nebivolol, B-D UF III  MINI PEN NEEDLES, glucose blood, ketorolac, RELION SHORT PEN NEEDLES, insulin lispro, umeclidinium-vilanterol, ONETOUCH DELICA LANCETS 33G, aspirin EC, fluticasone, amLODipine-valsartan, glucose blood, morphine, morphine, BASAGLAR KWIKPEN, and lovastatin.  No orders of the defined types were placed in this encounter.    Follow-up: No Follow-up on file.  Sonda Primes, MD

## 2017-01-19 NOTE — Assessment & Plan Note (Signed)
Chronic severe LBP, on opioids long term On MSIR, MSContin   Potential benefits of a long term opioids  use as well as potential risks  and complications were explained to the patient and were aknowledged.

## 2017-01-29 ENCOUNTER — Other Ambulatory Visit: Payer: Self-pay | Admitting: Internal Medicine

## 2017-01-29 ENCOUNTER — Ambulatory Visit: Payer: 59 | Admitting: Endocrinology

## 2017-02-03 ENCOUNTER — Other Ambulatory Visit: Payer: Self-pay | Admitting: Endocrinology

## 2017-02-26 ENCOUNTER — Other Ambulatory Visit: Payer: Self-pay | Admitting: Endocrinology

## 2017-02-26 ENCOUNTER — Ambulatory Visit: Payer: 59 | Admitting: Endocrinology

## 2017-04-08 ENCOUNTER — Other Ambulatory Visit: Payer: Self-pay | Admitting: Internal Medicine

## 2017-04-22 ENCOUNTER — Ambulatory Visit (INDEPENDENT_AMBULATORY_CARE_PROVIDER_SITE_OTHER): Payer: 59 | Admitting: Internal Medicine

## 2017-04-22 ENCOUNTER — Other Ambulatory Visit (INDEPENDENT_AMBULATORY_CARE_PROVIDER_SITE_OTHER): Payer: 59

## 2017-04-22 ENCOUNTER — Encounter: Payer: Self-pay | Admitting: Internal Medicine

## 2017-04-22 VITALS — BP 118/64 | HR 68 | Temp 98.3°F | Ht 70.0 in | Wt 247.0 lb

## 2017-04-22 DIAGNOSIS — E1165 Type 2 diabetes mellitus with hyperglycemia: Secondary | ICD-10-CM

## 2017-04-22 DIAGNOSIS — I10 Essential (primary) hypertension: Secondary | ICD-10-CM

## 2017-04-22 DIAGNOSIS — Z23 Encounter for immunization: Secondary | ICD-10-CM

## 2017-04-22 DIAGNOSIS — M545 Low back pain: Secondary | ICD-10-CM

## 2017-04-22 DIAGNOSIS — M79605 Pain in left leg: Secondary | ICD-10-CM

## 2017-04-22 DIAGNOSIS — Z0000 Encounter for general adult medical examination without abnormal findings: Secondary | ICD-10-CM | POA: Diagnosis not present

## 2017-04-22 DIAGNOSIS — E1121 Type 2 diabetes mellitus with diabetic nephropathy: Secondary | ICD-10-CM

## 2017-04-22 DIAGNOSIS — M79604 Pain in right leg: Secondary | ICD-10-CM

## 2017-04-22 DIAGNOSIS — Z794 Long term (current) use of insulin: Secondary | ICD-10-CM

## 2017-04-22 DIAGNOSIS — IMO0002 Reserved for concepts with insufficient information to code with codable children: Secondary | ICD-10-CM

## 2017-04-22 LAB — HEPATIC FUNCTION PANEL
ALBUMIN: 4.4 g/dL (ref 3.5–5.2)
ALT: 19 U/L (ref 0–53)
AST: 19 U/L (ref 0–37)
Alkaline Phosphatase: 72 U/L (ref 39–117)
Bilirubin, Direct: 0.1 mg/dL (ref 0.0–0.3)
Total Bilirubin: 0.3 mg/dL (ref 0.2–1.2)
Total Protein: 7.3 g/dL (ref 6.0–8.3)

## 2017-04-22 LAB — CBC WITH DIFFERENTIAL/PLATELET
BASOS ABS: 0.1 10*3/uL (ref 0.0–0.1)
Basophils Relative: 0.6 % (ref 0.0–3.0)
Eosinophils Absolute: 0.5 10*3/uL (ref 0.0–0.7)
Eosinophils Relative: 4.6 % (ref 0.0–5.0)
HCT: 43 % (ref 39.0–52.0)
HEMOGLOBIN: 14.6 g/dL (ref 13.0–17.0)
LYMPHS ABS: 2.8 10*3/uL (ref 0.7–4.0)
LYMPHS PCT: 26.9 % (ref 12.0–46.0)
MCHC: 34 g/dL (ref 30.0–36.0)
MCV: 88.4 fl (ref 78.0–100.0)
MONOS PCT: 7.6 % (ref 3.0–12.0)
Monocytes Absolute: 0.8 10*3/uL (ref 0.1–1.0)
NEUTROS PCT: 60.3 % (ref 43.0–77.0)
Neutro Abs: 6.3 10*3/uL (ref 1.4–7.7)
Platelets: 219 10*3/uL (ref 150.0–400.0)
RBC: 4.86 Mil/uL (ref 4.22–5.81)
RDW: 13.7 % (ref 11.5–15.5)
WBC: 10.4 10*3/uL (ref 4.0–10.5)

## 2017-04-22 LAB — BASIC METABOLIC PANEL
BUN: 16 mg/dL (ref 6–23)
CALCIUM: 9.5 mg/dL (ref 8.4–10.5)
CO2: 29 mEq/L (ref 19–32)
Chloride: 102 mEq/L (ref 96–112)
Creatinine, Ser: 0.9 mg/dL (ref 0.40–1.50)
GFR: 93.9 mL/min (ref >60.00)
GLUCOSE: 90 mg/dL (ref 70–99)
POTASSIUM: 4.3 meq/L (ref 3.5–5.1)
SODIUM: 136 meq/L (ref 135–145)

## 2017-04-22 LAB — LIPID PANEL
Cholesterol: 130 mg/dL (ref 0–200)
HDL: 42.7 mg/dL (ref >39.00)
LDL CALC: 68 mg/dL (ref 0–99)
NONHDL: 87.16
Total CHOL/HDL Ratio: 3
Triglycerides: 94 mg/dL (ref 0.0–149.0)
VLDL: 18.8 mg/dL (ref 0.0–40.0)

## 2017-04-22 LAB — URINALYSIS
BILIRUBIN URINE: NEGATIVE
KETONES UR: NEGATIVE
Leukocytes, UA: NEGATIVE
Nitrite: NEGATIVE
Specific Gravity, Urine: 1.025 (ref 1.000–1.030)
TOTAL PROTEIN, URINE-UPE24: NEGATIVE
UROBILINOGEN UA: 0.2 (ref 0.0–1.0)
Urine Glucose: NEGATIVE
pH: 6 (ref 5.0–8.0)

## 2017-04-22 LAB — HEMOGLOBIN A1C: HEMOGLOBIN A1C: 9.7 % — AB (ref 4.6–6.5)

## 2017-04-22 MED ORDER — MORPHINE SULFATE ER 100 MG PO TBCR: 200.0000 mg | EXTENDED_RELEASE_TABLET | Freq: Three times a day (TID) | ORAL | 0 refills | Status: DC

## 2017-04-22 MED ORDER — MORPHINE SULFATE 15 MG PO TABS: 15.0000 mg | ORAL_TABLET | Freq: Two times a day (BID) | ORAL | 0 refills | Status: DC | PRN

## 2017-04-22 NOTE — Progress Notes (Signed)
Subjective:  Patient ID: [name] [name], male    DOB: 1966-03-16  Age: 51 y.o. MRN: [id number]  CC: No chief complaint on file.   HPI [name] [name] presents for chronic pain, DM, HTN f/u. [name] [name] will need another PA on his pain meds...  Outpatient Medications Prior to Visit  Medication Sig Dispense Refill  . amLODipine-valsartan (EXFORGE) 10-320 MG tablet TAKE 1 TABLET BY MOUTH DAILY. 90 tablet 3  . aspirin EC 81 MG tablet Take 81 mg by mouth daily.    . B-D UF III MINI PEN NEEDLES 31G X 5 MM MISC USE AS DIRECTED 100 each 3  . Cholecalciferol (VITAMIN D3) 1000 UNITS CAPS Take 1 capsule by mouth daily.      . fluticasone (FLONASE) 50 MCG/ACT nasal spray Place 2 sprays into both nostrils daily. 42 g 3  . glucose blood (ONE TOUCH ULTRA TEST) test strip USE TWICE DAILY AS INSTRUCTED Dx code E11.9 100 each 3  . Insulin Glargine (BASAGLAR KWIKPEN) 100 UNIT/ML SOPN INJECT 100 UNITS INTO THE SKIN EVERY MORNING. 90 mL 1  . insulin lispro (HUMALOG KWIKPEN) 100 UNIT/ML KiwkPen Inject 20 units with supper. 15 mL 2  . ketorolac (TORADOL) 10 MG tablet Take 1 tablet (10 mg total) by mouth every 8 (eight) hours as needed for severe pain (renal colic). 20 tablet 0  . lovastatin (MEVACOR) 20 MG tablet TAKE 1 TABLET (20 MG TOTAL) BY MOUTH DAILY. 90 tablet 1  . morphine (MS CONTIN) 100 MG 12 hr tablet Take 2 tablets (200 mg total) by mouth 3 (three) times daily. 180 tablet 0  . morphine (MSIR) 15 MG tablet Take 1 tablet (15 mg total) by mouth 2 (two) times daily as needed for severe pain. 60 tablet 0  . nebivolol (BYSTOLIC) 10 MG tablet Take 10 mg by mouth daily.    . ONE TOUCH ULTRA TEST test strip USE TO CHECK BLOOD SUGARS TWICE A DAY. DX E11.9 100 each 5  . ONETOUCH DELICA LANCETS 33G MISC USE TO CHECK BLOOD SUGAR TWICE DAILY 100 each 2  . RELION PEN NEEDLE 31G/8MM 31G X 8 MM MISC USE AS DIRECTED 3 TIMES A DAY 100 each 11  . RELION SHORT PEN NEEDLES 31G X 8 MM MISC USE ONE NEEDLE TO INJECT INSULIN  THREE TIMES DAILY 100 each 0  . umeclidinium-vilanterol (ANORO ELLIPTA) 62.5-25 MCG/INH AEPB Inhale 1 puff into the lungs daily. 1 each 11   No facility-administered medications prior to visit.     ROS Review of Systems  Constitutional: Negative for appetite change, fatigue and unexpected weight change.  HENT: Negative for congestion, nosebleeds, sneezing, sore throat and trouble swallowing.   Eyes: Negative for itching and visual disturbance.  Respiratory: Negative for cough.   Cardiovascular: Negative for chest pain, palpitations and leg swelling.  Gastrointestinal: Negative for abdominal distention, blood in stool, diarrhea and nausea.  Genitourinary: Negative for frequency and hematuria.  Musculoskeletal: Positive for back pain. Negative for gait problem, joint swelling and neck pain.  Skin: Negative for rash.  Neurological: Negative for dizziness, tremors, speech difficulty and weakness.  Psychiatric/Behavioral: Negative for agitation, dysphoric mood and sleep disturbance. The patient is not nervous/anxious.     Objective:  BP 118/64 (BP Location: Left Arm, Patient Position: Sitting, Cuff Size: Large)   Pulse 68   Temp 98.3 F (36.8 C) (Oral)   Ht 5\' 10"  (1.778 m)   Wt 247 lb (112 kg)   SpO2 98%   BMI 35.44  kg/m   BP Readings from Last 3 Encounters:  04/22/17 118/64  01/19/17 126/70  11/26/16 136/82    Wt Readings from Last 3 Encounters:  04/22/17 247 lb (112 kg)  01/19/17 246 lb (111.6 kg)  11/26/16 251 lb (113.9 kg)    Physical Exam  Constitutional: He is oriented to person, place, and time. He appears well-developed. No distress.  NAD  HENT:  Mouth/Throat: Oropharynx is clear and moist.  Eyes: Pupils are equal, round, and reactive to light. Conjunctivae are normal.  Neck: Normal range of motion. No JVD present. No thyromegaly present.  Cardiovascular: Normal rate, regular rhythm, normal heart sounds and intact distal pulses.  Exam reveals no gallop and no  friction rub.   No murmur heard. Pulmonary/Chest: Effort normal and breath sounds normal. No respiratory distress. He has no wheezes. He has no rales. He exhibits no tenderness.  Abdominal: Soft. Bowel sounds are normal. He exhibits no distension and no mass. There is no tenderness. There is no rebound and no guarding.  Musculoskeletal: Normal range of motion. He exhibits tenderness. He exhibits no edema.  Lymphadenopathy:    He has no cervical adenopathy.  Neurological: He is alert and oriented to person, place, and time. He has normal reflexes. No cranial nerve deficit. He exhibits normal muscle tone. He displays a negative Romberg sign. Coordination and gait normal.  Skin: Skin is warm and dry. No rash noted.  Psychiatric: He has a normal mood and affect. His behavior is normal. Judgment and thought content normal.    Lab Results  Component Value Date   WBC 15.8 (H) 10/26/2013   HGB 14.1 10/26/2013   HCT 42.5 10/26/2013   PLT 263.0 10/26/2013   GLUCOSE 143 (H) 10/22/2016   CHOL 125 10/31/2014   TRIG 103.0 10/31/2014   HDL 37.20 (L) 10/31/2014   LDLDIRECT 116.6 05/20/2007   LDLCALC 67 10/31/2014   ALT 21 10/22/2016   AST 19 10/22/2016   NA 133 (L) 10/22/2016   K 4.1 10/22/2016   CL 102 10/22/2016   CREATININE 0.94 10/22/2016   BUN 15 10/22/2016   CO2 28 10/22/2016   TSH 3.73 06/04/2010   PSA 0.20 06/04/2010   INR 1.1 05/10/2008   HGBA1C 10.2 (H) 10/22/2016   MICROALBUR <0.7 10/17/2015    Dg Chest 2 View  Result Date: 06/29/2014 CLINICAL DATA:  Cough, congestion, shortness of breath for 3 weeks EXAM: CHEST  2 VIEW COMPARISON:  09/02/2009 FINDINGS: Cardiomediastinal silhouette is unremarkable. No acute infiltrate or pleural effusion. No pulmonary edema. Bony thorax is unremarkable. IMPRESSION: No active cardiopulmonary disease. Electronically Signed   By: Natasha Mead M.D.   On: 06/29/2014 15:36    Assessment & Plan:   There are no diagnoses linked to this encounter. I  am having [name] [name] maintain his Vitamin D3, nebivolol, B-D UF III MINI PEN NEEDLES, glucose blood, ketorolac, RELION SHORT PEN NEEDLES, insulin lispro, umeclidinium-vilanterol, aspirin EC, fluticasone, amLODipine-valsartan, lovastatin, morphine, morphine, ONE TOUCH ULTRA TEST, RELION PEN NEEDLE 31G/8MM, BASAGLAR KWIKPEN, and ONETOUCH DELICA LANCETS 33G.  No orders of the defined types were placed in this encounter.    Follow-up: No Follow-up on file.  Sonda Primes, MD

## 2017-04-22 NOTE — Assessment & Plan Note (Signed)
Chronic severe LBP, on opioids On MSIR, MSContin   Potential benefits of a long term opioids  use as well as potential risks  and complications were explained to the patient and were aknowledged.  [name] [name] will need another PA on his pain meds.Marland KitchenMarland Kitchen

## 2017-04-22 NOTE — Assessment & Plan Note (Signed)
Bystolic, Amlod-Valsartan

## 2017-04-22 NOTE — Assessment & Plan Note (Signed)
On Lantus Labs Risks associated with treatment noncompliance were discussed. Compliance was encouraged.

## 2017-04-22 NOTE — Addendum Note (Signed)
Addended by: Berton Lan R on: 04/22/2017 04:50 PM   Modules accepted: Orders

## 2017-04-23 LAB — PSA: PSA: 0.22 ng/mL (ref 0.10–4.00)

## 2017-04-23 LAB — TSH: TSH: 5.03 u[IU]/mL — AB (ref 0.35–4.50)

## 2017-06-10 ENCOUNTER — Telehealth: Payer: Self-pay

## 2017-06-10 NOTE — Telephone Encounter (Signed)
Key: [id number]

## 2017-06-15 NOTE — Telephone Encounter (Signed)
New PA:   Key: [id number]

## 2017-06-18 NOTE — Telephone Encounter (Signed)
PA approved for 6 months through 12/14/17. Pts wife notified

## 2017-07-21 ENCOUNTER — Other Ambulatory Visit: Payer: Self-pay | Admitting: Endocrinology

## 2017-07-21 NOTE — Telephone Encounter (Signed)
Please refill x 1 Ov is due  

## 2017-07-22 ENCOUNTER — Other Ambulatory Visit: Payer: Self-pay

## 2017-07-22 MED ORDER — INSULIN PEN NEEDLE 31G X 8 MM MISC: 0 refills | Status: DC

## 2017-07-28 ENCOUNTER — Ambulatory Visit: Payer: 59 | Admitting: Internal Medicine

## 2017-07-28 ENCOUNTER — Other Ambulatory Visit: Payer: 59

## 2017-07-28 ENCOUNTER — Encounter: Payer: Self-pay | Admitting: Internal Medicine

## 2017-07-28 DIAGNOSIS — R252 Cramp and spasm: Secondary | ICD-10-CM | POA: Diagnosis not present

## 2017-07-28 DIAGNOSIS — E1129 Type 2 diabetes mellitus with other diabetic kidney complication: Secondary | ICD-10-CM | POA: Diagnosis not present

## 2017-07-28 DIAGNOSIS — E1165 Type 2 diabetes mellitus with hyperglycemia: Secondary | ICD-10-CM

## 2017-07-28 DIAGNOSIS — M545 Low back pain: Secondary | ICD-10-CM

## 2017-07-28 DIAGNOSIS — IMO0002 Reserved for concepts with insufficient information to code with codable children: Secondary | ICD-10-CM

## 2017-07-28 DIAGNOSIS — E785 Hyperlipidemia, unspecified: Secondary | ICD-10-CM | POA: Diagnosis not present

## 2017-07-28 DIAGNOSIS — M79605 Pain in left leg: Secondary | ICD-10-CM

## 2017-07-28 MED ORDER — MORPHINE SULFATE ER 100 MG PO TBCR: 200.0000 mg | EXTENDED_RELEASE_TABLET | Freq: Three times a day (TID) | ORAL | 0 refills | Status: DC

## 2017-07-28 MED ORDER — MORPHINE SULFATE 15 MG PO TABS: 15.0000 mg | ORAL_TABLET | Freq: Two times a day (BID) | ORAL | 0 refills | Status: DC | PRN

## 2017-07-28 NOTE — Assessment & Plan Note (Signed)
Can use Tylenol PM for cramps Lovastatin can aggravate cramps

## 2017-07-28 NOTE — Progress Notes (Signed)
Subjective:  Patient ID: [name] [name], male    DOB: 1965/12/18  Age: 51 y.o. MRN: [id number]  CC: No chief complaint on file.   HPI [name] SimonBobby D [name] presents for LBP, HTN, DM f/u  Outpatient Medications Prior to Visit  Medication Sig Dispense Refill  . amLODipine-valsartan (EXFORGE) 10-320 MG tablet TAKE 1 TABLET BY MOUTH DAILY. 90 tablet 3  . aspirin EC 81 MG tablet Take 81 mg by mouth daily.    . B-D UF III MINI PEN NEEDLES 31G X 5 MM MISC USE AS DIRECTED 100 each 3  . Cholecalciferol (VITAMIN D3) 1000 UNITS CAPS Take 1 capsule by mouth daily.      . fluticasone (FLONASE) 50 MCG/ACT nasal spray Place 2 sprays into both nostrils daily. 42 g 3  . glucose blood (ONE TOUCH ULTRA TEST) test strip USE TWICE DAILY AS INSTRUCTED Dx code E11.9 100 each 3  . Insulin Glargine (BASAGLAR KWIKPEN) 100 UNIT/ML SOPN INJECT 100 UNITS INTO THE SKIN EVERY MORNING. 90 mL 1  . insulin lispro (HUMALOG KWIKPEN) 100 UNIT/ML KiwkPen Inject 20 units with supper. 15 mL 2  . Insulin Pen Needle (RELION PEN NEEDLE 31G/8MM) 31G X 8 MM MISC USE AS DIRECTED 3 TIMES A DAY 100 each 0  . ketorolac (TORADOL) 10 MG tablet Take 1 tablet (10 mg total) by mouth every 8 (eight) hours as needed for severe pain (renal colic). 20 tablet 0  . lovastatin (MEVACOR) 20 MG tablet TAKE 1 TABLET (20 MG TOTAL) BY MOUTH DAILY. 90 tablet 1  . morphine (MS CONTIN) 100 MG 12 hr tablet Take 2 tablets (200 mg total) by mouth 3 (three) times daily. 180 tablet 0  . morphine (MSIR) 15 MG tablet Take 1 tablet (15 mg total) by mouth 2 (two) times daily as needed for severe pain. 60 tablet 0  . nebivolol (BYSTOLIC) 10 MG tablet Take 10 mg by mouth daily.    . ONE TOUCH ULTRA TEST test strip USE TO CHECK BLOOD SUGARS TWICE A DAY. DX E11.9 100 each 5  . ONETOUCH DELICA LANCETS 33G MISC USE TO CHECK BLOOD SUGAR TWICE DAILY 100 each 2  . RELION SHORT PEN NEEDLES 31G X 8 MM MISC USE ONE NEEDLE TO INJECT INSULIN THREE TIMES DAILY 100 each 0  .  umeclidinium-vilanterol (ANORO ELLIPTA) 62.5-25 MCG/INH AEPB Inhale 1 puff into the lungs daily. 1 each 11   No facility-administered medications prior to visit.     ROS Review of Systems  Constitutional: Negative for appetite change, fatigue and unexpected weight change.  HENT: Negative for congestion, nosebleeds, sneezing, sore throat and trouble swallowing.   Eyes: Negative for itching and visual disturbance.  Respiratory: Negative for cough.   Cardiovascular: Negative for chest pain, palpitations and leg swelling.  Gastrointestinal: Negative for abdominal distention, blood in stool, diarrhea and nausea.  Genitourinary: Negative for frequency and hematuria.  Musculoskeletal: Positive for arthralgias, back pain and gait problem. Negative for joint swelling and neck pain.  Skin: Negative for rash.  Neurological: Negative for dizziness, tremors, speech difficulty and weakness.  Psychiatric/Behavioral: Negative for agitation, dysphoric mood and sleep disturbance. The patient is not nervous/anxious.     Objective:  BP 132/78 (BP Location: Left Arm, Patient Position: Sitting, Cuff Size: Large)   Pulse 80   Temp 98.7 F (37.1 C) (Oral)   Ht 5\' 10"  (1.778 m)   Wt 248 lb (112.5 kg)   SpO2 98%   BMI 35.58 kg/m   BP Readings  from Last 3 Encounters:  07/28/17 132/78  04/22/17 118/64  01/19/17 126/70    Wt Readings from Last 3 Encounters:  07/28/17 248 lb (112.5 kg)  04/22/17 247 lb (112 kg)  01/19/17 246 lb (111.6 kg)    Physical Exam  Constitutional: He is oriented to person, place, and time. He appears well-developed. No distress.  NAD  HENT:  Mouth/Throat: Oropharynx is clear and moist.  Eyes: Conjunctivae are normal. Pupils are equal, round, and reactive to light.  Neck: Normal range of motion. No JVD present. No thyromegaly present.  Cardiovascular: Normal rate, regular rhythm, normal heart sounds and intact distal pulses. Exam reveals no gallop and no friction rub.  No  murmur heard. Pulmonary/Chest: Effort normal and breath sounds normal. No respiratory distress. He has no wheezes. He has no rales. He exhibits no tenderness.  Abdominal: Soft. Bowel sounds are normal. He exhibits no distension and no mass. There is no tenderness. There is no rebound and no guarding.  Musculoskeletal: Normal range of motion. He exhibits tenderness. He exhibits no edema.  Lymphadenopathy:    He has no cervical adenopathy.  Neurological: He is alert and oriented to person, place, and time. He has normal reflexes. No cranial nerve deficit. He exhibits normal muscle tone. He displays a negative Romberg sign. Coordination and gait normal.  Skin: Skin is warm and dry. No rash noted.  Psychiatric: He has a normal mood and affect. His behavior is normal. Judgment and thought content normal.  LS tender  Lab Results  Component Value Date   WBC 10.4 04/22/2017   HGB 14.6 04/22/2017   HCT 43.0 04/22/2017   PLT 219.0 04/22/2017   GLUCOSE 90 04/22/2017   CHOL 130 04/22/2017   TRIG 94.0 04/22/2017   HDL 42.70 04/22/2017   LDLDIRECT 116.6 05/20/2007   LDLCALC 68 04/22/2017   ALT 19 04/22/2017   AST 19 04/22/2017   NA 136 04/22/2017   K 4.3 04/22/2017   CL 102 04/22/2017   CREATININE 0.90 04/22/2017   BUN 16 04/22/2017   CO2 29 04/22/2017   TSH 5.03 (H) 04/22/2017   PSA 0.22 04/22/2017   INR 1.1 05/10/2008   HGBA1C 9.7 (H) 04/22/2017   MICROALBUR <0.7 10/17/2015    Dg Chest 2 View  Result Date: 06/29/2014 CLINICAL DATA:  Cough, congestion, shortness of breath for 3 weeks EXAM: CHEST  2 VIEW COMPARISON:  09/02/2009 FINDINGS: Cardiomediastinal silhouette is unremarkable. No acute infiltrate or pleural effusion. No pulmonary edema. Bony thorax is unremarkable. IMPRESSION: No active cardiopulmonary disease. Electronically Signed   By: Natasha MeadLiviu  Pop M.D.   On: 06/29/2014 15:36    Assessment & Plan:   There are no diagnoses linked to this encounter. I am having [name] [name]  maintain his Vitamin D3, nebivolol, B-D UF III MINI PEN NEEDLES, glucose blood, ketorolac, RELION SHORT PEN NEEDLES, insulin lispro, umeclidinium-vilanterol, aspirin EC, fluticasone, amLODipine-valsartan, lovastatin, ONE TOUCH ULTRA TEST, BASAGLAR KWIKPEN, ONETOUCH DELICA LANCETS 33G, morphine, morphine, and Insulin Pen Needle.  No orders of the defined types were placed in this encounter.    Follow-up: No Follow-up on file.  Sonda PrimesAlex Plotnikov, MD

## 2017-07-28 NOTE — Assessment & Plan Note (Signed)
On Lantus 

## 2017-07-28 NOTE — Assessment & Plan Note (Signed)
Lovastatin 

## 2017-07-28 NOTE — Patient Instructions (Signed)
Can use Tylenol PM for cramps Lovastatin can aggravate cramps 

## 2017-07-28 NOTE — Assessment & Plan Note (Signed)
Chronic severe LBP, on opioids On MSIR, MSContin   Potential benefits of a long term opioids  use as well as potential risks  and complications were explained to the patient and were aknowledged. 

## 2017-08-17 ENCOUNTER — Other Ambulatory Visit: Payer: Self-pay | Admitting: Internal Medicine

## 2017-10-28 ENCOUNTER — Ambulatory Visit: Payer: 59 | Admitting: Internal Medicine

## 2017-10-28 ENCOUNTER — Encounter: Payer: Self-pay | Admitting: Internal Medicine

## 2017-10-28 DIAGNOSIS — I10 Essential (primary) hypertension: Secondary | ICD-10-CM

## 2017-10-28 DIAGNOSIS — Z6837 Body mass index (BMI) 37.0-37.9, adult: Secondary | ICD-10-CM

## 2017-10-28 DIAGNOSIS — E1165 Type 2 diabetes mellitus with hyperglycemia: Secondary | ICD-10-CM

## 2017-10-28 DIAGNOSIS — H5711 Ocular pain, right eye: Secondary | ICD-10-CM | POA: Diagnosis not present

## 2017-10-28 DIAGNOSIS — M545 Low back pain: Secondary | ICD-10-CM

## 2017-10-28 DIAGNOSIS — E1129 Type 2 diabetes mellitus with other diabetic kidney complication: Secondary | ICD-10-CM

## 2017-10-28 DIAGNOSIS — M79605 Pain in left leg: Secondary | ICD-10-CM

## 2017-10-28 DIAGNOSIS — IMO0002 Reserved for concepts with insufficient information to code with codable children: Secondary | ICD-10-CM

## 2017-10-28 DIAGNOSIS — M79604 Pain in right leg: Secondary | ICD-10-CM

## 2017-10-28 MED ORDER — ERYTHROMYCIN 5 MG/GM OP OINT: 1.0000 "application " | TOPICAL_OINTMENT | Freq: Four times a day (QID) | OPHTHALMIC | 1 refills | Status: DC

## 2017-10-28 MED ORDER — MORPHINE SULFATE 15 MG PO TABS: 15.0000 mg | ORAL_TABLET | Freq: Two times a day (BID) | ORAL | 0 refills | Status: DC | PRN

## 2017-10-28 MED ORDER — MORPHINE SULFATE ER 100 MG PO TBCR: 200.0000 mg | EXTENDED_RELEASE_TABLET | Freq: Three times a day (TID) | ORAL | 0 refills | Status: DC

## 2017-10-28 MED ORDER — AMLODIPINE BESYLATE-VALSARTAN 10-320 MG PO TABS: 1.0000 | ORAL_TABLET | Freq: Every day | ORAL | 3 refills | Status: DC

## 2017-10-28 NOTE — Progress Notes (Signed)
Subjective:  Patient ID: [name] [name], male    DOB: Mar 11, 1966  Age: 52 y.o. MRN: [id number]  CC: No chief complaint on file.   HPI [name] SimonBobby D [name] presents for chronic pain, DM, HTN f/u C/o R eye pain  - something got into it...  Outpatient Medications Prior to Visit  Medication Sig Dispense Refill  . aspirin EC 81 MG tablet Take 81 mg by mouth daily.    . B-D UF III MINI PEN NEEDLES 31G X 5 MM MISC USE AS DIRECTED 100 each 3  . Cholecalciferol (VITAMIN D3) 1000 UNITS CAPS Take 1 capsule by mouth daily.      . fluticasone (FLONASE) 50 MCG/ACT nasal spray Place 2 sprays into both nostrils daily. 42 g 3  . glucose blood (ONE TOUCH ULTRA TEST) test strip USE TWICE DAILY AS INSTRUCTED Dx code E11.9 100 each 3  . Insulin Glargine (BASAGLAR KWIKPEN) 100 UNIT/ML SOPN INJECT 100 UNITS INTO THE SKIN EVERY MORNING. 90 mL 1  . insulin lispro (HUMALOG KWIKPEN) 100 UNIT/ML KiwkPen Inject 20 units with supper. 15 mL 2  . Insulin Pen Needle (RELION PEN NEEDLE 31G/8MM) 31G X 8 MM MISC USE AS DIRECTED 3 TIMES A DAY 100 each 0  . ketorolac (TORADOL) 10 MG tablet Take 1 tablet (10 mg total) by mouth every 8 (eight) hours as needed for severe pain (renal colic). 20 tablet 0  . lovastatin (MEVACOR) 20 MG tablet TAKE 1 TABLET (20 MG TOTAL) BY MOUTH DAILY. 90 tablet 1  . nebivolol (BYSTOLIC) 10 MG tablet Take 10 mg by mouth daily.    . ONE TOUCH ULTRA TEST test strip USE TO CHECK BLOOD SUGARS TWICE A DAY. DX E11.9 100 each 5  . ONETOUCH DELICA LANCETS 33G MISC USE TO CHECK BLOOD SUGAR TWICE DAILY 100 each 2  . RELION SHORT PEN NEEDLES 31G X 8 MM MISC USE ONE NEEDLE TO INJECT INSULIN THREE TIMES DAILY 100 each 0  . umeclidinium-vilanterol (ANORO ELLIPTA) 62.5-25 MCG/INH AEPB Inhale 1 puff into the lungs daily. 1 each 11  . amLODipine-valsartan (EXFORGE) 10-320 MG tablet TAKE 1 TABLET BY MOUTH DAILY. 90 tablet 3  . morphine (MS CONTIN) 100 MG 12 hr tablet Take 2 tablets (200 mg total) by mouth 3 (three)  times daily. 180 tablet 0  . morphine (MSIR) 15 MG tablet Take 1 tablet (15 mg total) by mouth 2 (two) times daily as needed for severe pain. 60 tablet 0   No facility-administered medications prior to visit.     ROS Review of Systems  Constitutional: Positive for fatigue. Negative for appetite change and unexpected weight change.  HENT: Negative for congestion, nosebleeds, sneezing, sore throat and trouble swallowing.   Eyes: Positive for pain and redness. Negative for itching and visual disturbance.  Respiratory: Negative for cough.   Cardiovascular: Negative for chest pain, palpitations and leg swelling.  Gastrointestinal: Negative for abdominal distention, blood in stool, diarrhea and nausea.  Genitourinary: Negative for frequency and hematuria.  Musculoskeletal: Positive for arthralgias and back pain. Negative for gait problem, joint swelling and neck pain.  Skin: Negative for rash.  Neurological: Negative for dizziness, tremors, speech difficulty and weakness.  Psychiatric/Behavioral: Negative for agitation, dysphoric mood and sleep disturbance. The patient is not nervous/anxious.     Objective:  BP 132/74 (BP Location: Left Arm, Patient Position: Sitting, Cuff Size: Large)   Pulse 78   Temp 98.5 F (36.9 C) (Oral)   Ht 5\' 10"  (1.778 m)  Wt 254 lb (115.2 kg)   SpO2 99%   BMI 36.45 kg/m   BP Readings from Last 3 Encounters:  10/28/17 132/74  07/28/17 132/78  04/22/17 118/64    Wt Readings from Last 3 Encounters:  10/28/17 254 lb (115.2 kg)  07/28/17 248 lb (112.5 kg)  04/22/17 247 lb (112 kg)    Physical Exam  Constitutional: He is oriented to person, place, and time. He appears well-developed. No distress.  NAD  HENT:  Mouth/Throat: Oropharynx is clear and moist.  Eyes: Conjunctivae are normal. Pupils are equal, round, and reactive to light. Right eye exhibits no discharge. Left eye exhibits no discharge.  Neck: Normal range of motion. No JVD present. No  thyromegaly present.  Cardiovascular: Normal rate, regular rhythm, normal heart sounds and intact distal pulses. Exam reveals no gallop and no friction rub.  No murmur heard. Pulmonary/Chest: Effort normal and breath sounds normal. No respiratory distress. He has no wheezes. He has no rales. He exhibits no tenderness.  Abdominal: Soft. Bowel sounds are normal. He exhibits no distension and no mass. There is no tenderness. There is no rebound and no guarding.  Musculoskeletal: Normal range of motion. He exhibits tenderness. He exhibits no edema.  Lymphadenopathy:    He has no cervical adenopathy.  Neurological: He is alert and oriented to person, place, and time. He has normal reflexes. No cranial nerve deficit. He exhibits normal muscle tone. He displays a negative Romberg sign. Coordination and gait normal.  Skin: Skin is warm and dry. No rash noted.  Psychiatric: He has a normal mood and affect. His behavior is normal. Judgment and thought content normal.  LS tender R eye w/erythema I do not see a foreign body in  Lab Results  Component Value Date   WBC 10.4 04/22/2017   HGB 14.6 04/22/2017   HCT 43.0 04/22/2017   PLT 219.0 04/22/2017   GLUCOSE 90 04/22/2017   CHOL 130 04/22/2017   TRIG 94.0 04/22/2017   HDL 42.70 04/22/2017   LDLDIRECT 116.6 05/20/2007   LDLCALC 68 04/22/2017   ALT 19 04/22/2017   AST 19 04/22/2017   NA 136 04/22/2017   K 4.3 04/22/2017   CL 102 04/22/2017   CREATININE 0.90 04/22/2017   BUN 16 04/22/2017   CO2 29 04/22/2017   TSH 5.03 (H) 04/22/2017   PSA 0.22 04/22/2017   INR 1.1 05/10/2008   HGBA1C 9.7 (H) 04/22/2017   MICROALBUR <0.7 10/17/2015    Dg Chest 2 View  Result Date: 06/29/2014 CLINICAL DATA:  Cough, congestion, shortness of breath for 3 weeks EXAM: CHEST  2 VIEW COMPARISON:  09/02/2009 FINDINGS: Cardiomediastinal silhouette is unremarkable. No acute infiltrate or pleural effusion. No pulmonary edema. Bony thorax is unremarkable.  IMPRESSION: No active cardiopulmonary disease. Electronically Signed   By: Natasha Mead M.D.   On: 06/29/2014 15:36    Assessment & Plan:   Diagnoses and all orders for this visit:  DM (diabetes mellitus), type 2, uncontrolled, with renal complications (HCC)  Low back pain radiating to both legs -     Pain Management Screening Profile (10S); Standing  Class 2 severe obesity due to excess calories with serious comorbidity and body mass index (BMI) of 37.0 to 37.9 in adult [facility])  Essential hypertension  Other orders -     amLODipine-valsartan (EXFORGE) 10-320 MG tablet; Take 1 tablet by mouth daily. -     Discontinue: morphine (MSIR) 15 MG tablet; Take 1 tablet (15 mg total) by mouth 2 (  two) times daily as needed for severe pain. -     Discontinue: morphine (MS CONTIN) 100 MG 12 hr tablet; Take 2 tablets (200 mg total) by mouth 3 (three) times daily. -     Discontinue: morphine (MS CONTIN) 100 MG 12 hr tablet; Take 2 tablets (200 mg total) by mouth 3 (three) times daily. -     Discontinue: morphine (MSIR) 15 MG tablet; Take 1 tablet (15 mg total) by mouth 2 (two) times daily as needed for severe pain. -     morphine (MS CONTIN) 100 MG 12 hr tablet; Take 2 tablets (200 mg total) by mouth 3 (three) times daily. -     morphine (MSIR) 15 MG tablet; Take 1 tablet (15 mg total) by mouth 2 (two) times daily as needed for severe pain. -     erythromycin ophthalmic ointment; Place 1 application into the right eye 4 (four) times daily.   I am having [name] [name] start on erythromycin. I am also having him maintain his Vitamin D3, nebivolol, B-D UF III MINI PEN NEEDLES, glucose blood, ketorolac, RELION SHORT PEN NEEDLES, insulin lispro, umeclidinium-vilanterol, aspirin EC, fluticasone, lovastatin, ONE TOUCH ULTRA TEST, BASAGLAR KWIKPEN, ONETOUCH DELICA LANCETS 33G, Insulin Pen Needle, amLODipine-valsartan, morphine, and morphine.  Meds ordered this encounter  Medications  . amLODipine-valsartan  (EXFORGE) 10-320 MG tablet    Sig: Take 1 tablet by mouth daily.    Dispense:  90 tablet    Refill:  3  . DISCONTD: morphine (MSIR) 15 MG tablet    Sig: Take 1 tablet (15 mg total) by mouth 2 (two) times daily as needed for severe pain.    Dispense:  60 tablet    Refill:  0    Fill on or after 11/03/17  . DISCONTD: morphine (MS CONTIN) 100 MG 12 hr tablet    Sig: Take 2 tablets (200 mg total) by mouth 3 (three) times daily.    Dispense:  180 tablet    Refill:  0    Fill on or after 11/03/17  . DISCONTD: morphine (MS CONTIN) 100 MG 12 hr tablet    Sig: Take 2 tablets (200 mg total) by mouth 3 (three) times daily.    Dispense:  180 tablet    Refill:  0    Fill on or after 12/04/17  . DISCONTD: morphine (MSIR) 15 MG tablet    Sig: Take 1 tablet (15 mg total) by mouth 2 (two) times daily as needed for severe pain.    Dispense:  60 tablet    Refill:  0    Fill on or after 12/04/17  . morphine (MS CONTIN) 100 MG 12 hr tablet    Sig: Take 2 tablets (200 mg total) by mouth 3 (three) times daily.    Dispense:  180 tablet    Refill:  0    Fill on or after 01/03/18  . morphine (MSIR) 15 MG tablet    Sig: Take 1 tablet (15 mg total) by mouth 2 (two) times daily as needed for severe pain.    Dispense:  60 tablet    Refill:  0    Fill on or after 01/03/18  . erythromycin ophthalmic ointment    Sig: Place 1 application into the right eye 4 (four) times daily.    Dispense:  3.5 g    Refill:  1     Follow-up: Return in about 3 months (around 01/25/2018) for a follow-up visit.  Sonda Primes, MD

## 2017-10-28 NOTE — Assessment & Plan Note (Signed)
Erythromycin qid in the R eye, ice To see eye dr if not better

## 2017-10-28 NOTE — Assessment & Plan Note (Signed)
On Bystolic, Amlod-Valsartan NAS diet 

## 2017-10-28 NOTE — Assessment & Plan Note (Signed)
On MSIR, MSContin   Potential benefits of a long term opioids  use as well as potential risks  and complications were explained to the patient and were aknowledged. 

## 2017-10-28 NOTE — Assessment & Plan Note (Signed)
Wt Readings from Last 3 Encounters:  10/28/17 254 lb (115.2 kg)  07/28/17 248 lb (112.5 kg)  04/22/17 247 lb (112 kg)

## 2017-10-28 NOTE — Assessment & Plan Note (Signed)
Stable

## 2017-10-28 NOTE — Assessment & Plan Note (Signed)
Dr Everardo AllEllison On Lantus Poor compliance Risks associated with treatment noncompliance were discussed. Compliance was encouraged.

## 2017-11-09 ENCOUNTER — Telehealth: Payer: Self-pay | Admitting: Internal Medicine

## 2017-11-09 NOTE — Telephone Encounter (Signed)
Copied from CRM [id number]. Topic: Quick Communication - See Telephone Encounter >> Nov 09, 2017  2:25 PM Landry Mellow wrote: CRM for notification. See Telephone encounter for:   11/09/17. Pharmacy called - wanting clarification morphine (MS CONTIN) 100 MG 12 hr tablet    Need the diagnosis and directions for this med, Cb is [phone]

## 2017-11-27 NOTE — Telephone Encounter (Signed)
[facility] calling for a new prior auth for this medication morphine (MSIR) 15 MG tablet  They sent a fax yesterday requesting a prior. Prior Berkley Harveyauth [id number]  They will fax again now

## 2017-12-02 ENCOUNTER — Telehealth: Payer: Self-pay | Admitting: Internal Medicine

## 2017-12-02 DIAGNOSIS — H04123 Dry eye syndrome of bilateral lacrimal glands: Secondary | ICD-10-CM

## 2017-12-02 DIAGNOSIS — H10413 Chronic giant papillary conjunctivitis, bilateral: Secondary | ICD-10-CM

## 2017-12-02 DIAGNOSIS — E1165 Type 2 diabetes mellitus with hyperglycemia: Principal | ICD-10-CM

## 2017-12-02 DIAGNOSIS — E1129 Type 2 diabetes mellitus with other diabetic kidney complication: Secondary | ICD-10-CM

## 2017-12-02 DIAGNOSIS — IMO0002 Reserved for concepts with insufficient information to code with codable children: Secondary | ICD-10-CM

## 2017-12-02 NOTE — Telephone Encounter (Signed)
Copied from CRM [id number]. Topic: Referral - Request >> Dec 02, 2017  2:22 PM Leafy Roobinson, Norma J wrote: Reason for CRM: [facility] is calling from  dr Dione Boozegroat office and needs a [facility] referral for this pt. The diagnosis codes are E11.9, H04.123 AND H10.413. Pt has appointment tomorrow at 330pm. Dr Dione Boozegroat NPI [id number].phone number [phone] and fax [phone]

## 2017-12-03 NOTE — Telephone Encounter (Signed)
faxed

## 2017-12-03 NOTE — Telephone Encounter (Signed)
Dr Dione BoozeGroat office calling, patient is being seen today, need auth asap

## 2017-12-03 NOTE — Telephone Encounter (Signed)
Done yesterday by Lawson FiscalLori

## 2017-12-12 ENCOUNTER — Other Ambulatory Visit: Payer: Self-pay | Admitting: Endocrinology

## 2017-12-13 NOTE — Telephone Encounter (Signed)
Please refill x 1 Ov is due  

## 2018-01-19 NOTE — Telephone Encounter (Signed)
A approved though 01/02/18

## 2018-01-22 ENCOUNTER — Encounter: Payer: Self-pay | Admitting: Internal Medicine

## 2018-01-22 ENCOUNTER — Ambulatory Visit: Payer: 59 | Admitting: Internal Medicine

## 2018-01-22 ENCOUNTER — Other Ambulatory Visit (INDEPENDENT_AMBULATORY_CARE_PROVIDER_SITE_OTHER): Payer: 59

## 2018-01-22 DIAGNOSIS — IMO0002 Reserved for concepts with insufficient information to code with codable children: Secondary | ICD-10-CM

## 2018-01-22 DIAGNOSIS — E1165 Type 2 diabetes mellitus with hyperglycemia: Secondary | ICD-10-CM | POA: Diagnosis not present

## 2018-01-22 DIAGNOSIS — M545 Low back pain: Secondary | ICD-10-CM | POA: Diagnosis not present

## 2018-01-22 DIAGNOSIS — M79604 Pain in right leg: Secondary | ICD-10-CM

## 2018-01-22 DIAGNOSIS — E785 Hyperlipidemia, unspecified: Secondary | ICD-10-CM

## 2018-01-22 DIAGNOSIS — E1129 Type 2 diabetes mellitus with other diabetic kidney complication: Secondary | ICD-10-CM | POA: Diagnosis not present

## 2018-01-22 DIAGNOSIS — G47429 Narcolepsy in conditions classified elsewhere without cataplexy: Secondary | ICD-10-CM | POA: Diagnosis not present

## 2018-01-22 DIAGNOSIS — I10 Essential (primary) hypertension: Secondary | ICD-10-CM

## 2018-01-22 DIAGNOSIS — M79605 Pain in left leg: Secondary | ICD-10-CM

## 2018-01-22 DIAGNOSIS — G47419 Narcolepsy without cataplexy: Secondary | ICD-10-CM | POA: Insufficient documentation

## 2018-01-22 LAB — BASIC METABOLIC PANEL
BUN: 14 mg/dL (ref 6–23)
CHLORIDE: 99 meq/L (ref 96–112)
CO2: 27 meq/L (ref 19–32)
Calcium: 9.3 mg/dL (ref 8.4–10.5)
Creatinine, Ser: 0.86 mg/dL (ref 0.40–1.50)
GFR: 98.67 mL/min (ref >60.00)
GLUCOSE: 377 mg/dL — AB (ref 70–99)
Potassium: 4.3 mEq/L (ref 3.5–5.1)
SODIUM: 132 meq/L — AB (ref 135–145)

## 2018-01-22 LAB — HEMOGLOBIN A1C: HEMOGLOBIN A1C: 10 % — AB (ref 4.6–6.5)

## 2018-01-22 MED ORDER — MORPHINE SULFATE 15 MG PO TABS: 15.0000 mg | ORAL_TABLET | Freq: Two times a day (BID) | ORAL | 0 refills | Status: DC | PRN

## 2018-01-22 MED ORDER — MORPHINE SULFATE ER 100 MG PO TBCR: 200.0000 mg | EXTENDED_RELEASE_TABLET | Freq: Three times a day (TID) | ORAL | 0 refills | Status: DC

## 2018-01-22 MED ORDER — MORPHINE SULFATE 15 MG PO TABS
15.0000 mg | ORAL_TABLET | Freq: Two times a day (BID) | ORAL | 0 refills | Status: DC | PRN
Start: 2018-01-22 — End: 2018-01-22

## 2018-01-22 NOTE — Assessment & Plan Note (Signed)
F/u w/Dr Everardo All On Lantus Poor compliance Risks associated with treatment noncompliance were discussed. Compliance was encouraged.

## 2018-01-22 NOTE — Assessment & Plan Note (Signed)
Cardiology ref Dr Jens Som

## 2018-01-22 NOTE — Progress Notes (Signed)
Subjective:  Patient ID: [name] [name], male    DOB: [birth year]  Age: 52 y.o. MRN: [id number]  CC: No chief complaint on file.   HPI [name] [name] presents for chronic pain, HTN, DM C/o falling asleep at home all the time when returns from work. Denies OSA sx's F/u dyslipidemia - no CP  Outpatient Medications Prior to Visit  Medication Sig Dispense Refill  . amLODipine-valsartan (EXFORGE) 10-320 MG tablet Take 1 tablet by mouth daily. 90 tablet 3  . aspirin EC 81 MG tablet Take 81 mg by mouth daily.    . B-D UF III MINI PEN NEEDLES 31G X 5 MM MISC USE AS DIRECTED 100 each 3  . Cholecalciferol (VITAMIN D3) 1000 UNITS CAPS Take 1 capsule by mouth daily.      Marland Kitchen erythromycin ophthalmic ointment Place 1 application into the right eye 4 (four) times daily. 3.5 g 1  . fluticasone (FLONASE) 50 MCG/ACT nasal spray Place 2 sprays into both nostrils daily. 42 g 3  . glucose blood (ONE TOUCH ULTRA TEST) test strip USE TWICE DAILY AS INSTRUCTED Dx code E11.9 100 each 3  . Insulin Glargine (BASAGLAR KWIKPEN) 100 UNIT/ML SOPN INJECT 100 UNITS INTO THE SKIN EVERY MORNING. 90 pen 1  . insulin lispro (HUMALOG KWIKPEN) 100 UNIT/ML KiwkPen Inject 20 units with supper. 15 mL 2  . Insulin Pen Needle (RELION PEN NEEDLE 31G/8MM) 31G X 8 MM MISC USE AS DIRECTED 3 TIMES A DAY 100 each 0  . ketorolac (TORADOL) 10 MG tablet Take 1 tablet (10 mg total) by mouth every 8 (eight) hours as needed for severe pain (renal colic). 20 tablet 0  . lovastatin (MEVACOR) 20 MG tablet TAKE 1 TABLET (20 MG TOTAL) BY MOUTH DAILY. 90 tablet 1  . morphine (MS CONTIN) 100 MG 12 hr tablet Take 2 tablets (200 mg total) by mouth 3 (three) times daily. 180 tablet 0  . morphine (MSIR) 15 MG tablet Take 1 tablet (15 mg total) by mouth 2 (two) times daily as needed for severe pain. 60 tablet 0  . nebivolol (BYSTOLIC) 10 MG tablet Take 10 mg by mouth daily.    . ONE TOUCH ULTRA TEST test strip USE TO CHECK BLOOD SUGARS TWICE A DAY. DX  E11.9 100 each 5  . ONETOUCH DELICA LANCETS 33G MISC USE TO CHECK BLOOD SUGAR TWICE DAILY 100 each 2  . RELION SHORT PEN NEEDLES 31G X 8 MM MISC USE ONE NEEDLE TO INJECT INSULIN THREE TIMES DAILY 100 each 0  . umeclidinium-vilanterol (ANORO ELLIPTA) 62.5-25 MCG/INH AEPB Inhale 1 puff into the lungs daily. 1 each 11   No facility-administered medications prior to visit.     ROS: Review of Systems  Constitutional: Positive for fatigue. Negative for appetite change and unexpected weight change.  HENT: Negative for congestion, nosebleeds, sneezing, sore throat and trouble swallowing.   Eyes: Negative for itching and visual disturbance.  Respiratory: Negative for cough.   Cardiovascular: Negative for chest pain, palpitations and leg swelling.  Gastrointestinal: Negative for abdominal distention, blood in stool, diarrhea and nausea.  Genitourinary: Negative for frequency and hematuria.  Musculoskeletal: Positive for back pain and myalgias. Negative for gait problem, joint swelling and neck pain.  Skin: Negative for rash.  Neurological: Negative for dizziness, tremors, speech difficulty and weakness.  Psychiatric/Behavioral: Positive for sleep disturbance. Negative for agitation, dysphoric mood and suicidal ideas. The patient is not nervous/anxious.     Objective:  BP 130/82 (BP Location: Left Arm,  Patient Position: Sitting, Cuff Size: Large)   Pulse 88   Ht  (1.778 m)   Wt 252 lb (114.3 kg)   SpO2 94%   BMI 36.16 kg/m   BP Readings from Last 3 Encounters:  01/22/18 130/82  10/28/17 132/74  07/28/17 132/78    Wt Readings from Last 3 Encounters:  01/22/18 252 lb (114.3 kg)  10/28/17 254 lb (115.2 kg)  07/28/17 248 lb (112.5 kg)    Physical Exam  Constitutional: He is oriented to person, place, and time. He appears well-developed. No distress.  NAD  HENT:  Mouth/Throat: Oropharynx is clear and moist.  Eyes: Pupils are equal, round, and reactive to light. Conjunctivae are  normal.  Neck: Normal range of motion. No JVD present. No thyromegaly present.  Cardiovascular: Normal rate, regular rhythm, normal heart sounds and intact distal pulses. Exam reveals no gallop and no friction rub.  No murmur heard. Pulmonary/Chest: Effort normal and breath sounds normal. No respiratory distress. He has no wheezes. He has no rales. He exhibits no tenderness.  Abdominal: Soft. Bowel sounds are normal. He exhibits no distension and no mass. There is no tenderness. There is no rebound and no guarding.  Musculoskeletal: Normal range of motion. He exhibits no edema or tenderness.  Lymphadenopathy:    He has no cervical adenopathy.  Neurological: He is alert and oriented to person, place, and time. He has normal reflexes. No cranial nerve deficit. He exhibits normal muscle tone. He displays a negative Romberg sign. Coordination and gait normal.  Skin: Skin is warm and dry. No rash noted.  Psychiatric: He has a normal mood and affect. His behavior is normal. Judgment and thought content normal.   Obese  Lab Results  Component Value Date   WBC 10.4 04/22/2017   HGB 14.6 04/22/2017   HCT 43.0 04/22/2017   PLT 219.0 04/22/2017   GLUCOSE 90 04/22/2017   CHOL 130 04/22/2017   TRIG 94.0 04/22/2017   HDL 42.70 04/22/2017   LDLDIRECT 116.6 05/20/2007   LDLCALC 68 04/22/2017   ALT 19 04/22/2017   AST 19 04/22/2017   NA 136 04/22/2017   K 4.3 04/22/2017   CL 102 04/22/2017   CREATININE 0.90 04/22/2017   BUN 16 04/22/2017   CO2 29 04/22/2017   TSH 5.03 (H) 04/22/2017   PSA 0.22 04/22/2017   INR 1.1 05/10/2008   HGBA1C 9.7 (H) 04/22/2017   MICROALBUR <0.7 10/17/2015    Dg Chest 2 View  Result Date: 06/29/2014 CLINICAL DATA:  Cough, congestion, shortness of breath for 3 weeks EXAM: CHEST  2 VIEW COMPARISON:  09/02/2009 FINDINGS: Cardiomediastinal silhouette is unremarkable. No acute infiltrate or pleural effusion. No pulmonary edema. Bony thorax is unremarkable. IMPRESSION:  No active cardiopulmonary disease. Electronically Signed   By: Natasha Mead M.D.   On: 06/29/2014 15:36    Assessment & Plan:   There are no diagnoses linked to this encounter.   No orders of the defined types were placed in this encounter.    Follow-up: No follow-ups on file.  Sonda Primes, MD

## 2018-01-22 NOTE — Assessment & Plan Note (Signed)
Worse Denies OSA (wife) Neurol ref Pain meds discussed

## 2018-01-22 NOTE — Assessment & Plan Note (Signed)
Chronic severe LBP, on opioids On MSIR, MSContin   Potential benefits of a long term opioids  use as well as potential risks  and complications were explained to the patient and were aknowledged. 

## 2018-01-22 NOTE — Assessment & Plan Note (Signed)
BP Readings from Last 3 Encounters:  01/22/18 130/82  10/28/17 132/74  07/28/17 132/78

## 2018-01-27 ENCOUNTER — Telehealth: Payer: Self-pay | Admitting: Internal Medicine

## 2018-01-27 NOTE — Telephone Encounter (Signed)
Copied from CRM [id number]. Topic: Quick Communication - Lab Results >> Jan 27, 2018 10:45 AM Scarlett Presto, CMA wrote: Called patient to inform them of 01/22/18 lab results. When patient returns call, triage nurse may disclose results.

## 2018-01-27 NOTE — Telephone Encounter (Signed)
Charted in result notes. 

## 2018-02-02 ENCOUNTER — Ambulatory Visit: Payer: 59 | Admitting: Internal Medicine

## 2018-02-02 ENCOUNTER — Other Ambulatory Visit: Payer: Self-pay | Admitting: Internal Medicine

## 2018-02-03 ENCOUNTER — Other Ambulatory Visit: Payer: Self-pay | Admitting: Internal Medicine

## 2018-03-03 NOTE — Progress Notes (Signed)
Referring-[name] Plotnikov [name] Reason for referral-Dyspnea  HPI: 52 year old male for evaluation of dyspnea at request of [name] [name]. Echocardiogram September 2009 showed normal LV function and mild left atrial enlargement.  Nuclear study November 2006 showed ejection fraction 57%, apical thinning but no ischemia.  Laboratories May 2019 showed sodium 132, potassium 4.3, BUN 14, creatinine 0.86.  Over the past year patient notes dyspnea on exertion relieved with rest.  No orthopnea, PND, pedal edema, chest pain or syncope.  Because of the above cardiology asked to evaluate.  Current Outpatient Medications  Medication Sig Dispense Refill  . amLODipine-valsartan (EXFORGE) 10-320 MG tablet Take 1 tablet by mouth daily. 90 tablet 3  . aspirin EC 81 MG tablet Take 81 mg by mouth daily.    . B-D UF III MINI PEN NEEDLES 31G X 5 MM MISC USE AS DIRECTED 100 each 3  . Cholecalciferol (VITAMIN D3) 1000 UNITS CAPS Take 1 capsule by mouth daily.      Marland Kitchen. erythromycin ophthalmic ointment Place 1 application into the right eye 4 (four) times daily. 3.5 g 1  . fluticasone (FLONASE) 50 MCG/ACT nasal spray PLACE 2 SPRAYS INTO BOTH NOSTRILS DAILY. 18 g 5  . glucose blood (ONE TOUCH ULTRA TEST) test strip USE TO CHECK BLOOD SUGARS TWICE A DAY. DX E11.9 100 each 3  . Insulin Glargine (BASAGLAR KWIKPEN) 100 UNIT/ML SOPN INJECT 100 UNITS INTO THE SKIN EVERY MORNING. 90 pen 1  . insulin lispro (HUMALOG KWIKPEN) 100 UNIT/ML KiwkPen Inject 20 units with supper. 15 mL 2  . Insulin Pen Needle (RELION PEN NEEDLE 31G/8MM) 31G X 8 MM MISC USE AS DIRECTED 3 TIMES A DAY 100 each 0  . ketorolac (TORADOL) 10 MG tablet Take 1 tablet (10 mg total) by mouth every 8 (eight) hours as needed for severe pain (renal colic). 20 tablet 0  . lovastatin (MEVACOR) 20 MG tablet TAKE 1 TABLET (20 MG TOTAL) BY MOUTH DAILY. 90 tablet 1  . morphine (MS CONTIN) 100 MG 12 hr tablet Take 2 tablets (200 mg total) by mouth 3 (three) times  daily. 180 tablet 0  . morphine (MSIR) 15 MG tablet Take 1 tablet (15 mg total) by mouth 2 (two) times daily as needed for severe pain. 60 tablet 0  . nebivolol (BYSTOLIC) 10 MG tablet Take 10 mg by mouth daily.    [name] [name]. ONETOUCH DELICA LANCETS 33G MISC USE TO CHECK BLOOD SUGAR TWICE DAILY 100 each 2  . RELION SHORT PEN NEEDLES 31G X 8 MM MISC USE ONE NEEDLE TO INJECT INSULIN THREE TIMES DAILY 100 each 0  . umeclidinium-vilanterol (ANORO ELLIPTA) 62.5-25 MCG/INH AEPB Inhale 1 puff into the lungs daily. 1 each 11   No current facility-administered medications for this visit.     Allergies  Allergen Reactions  . Belviq [Lorcaserin Hcl]     wierd  . Benazepril Hcl     REACTION: cough  . Nsaids     Can take short term Upset stomach     Past Medical History:  Diagnosis Date  . COPD (chronic obstructive pulmonary disease) (HCC)   . Diabetes mellitus   . GERD (gastroesophageal reflux disease)   . Hyperlipidemia   . Hypertension   . Lower back pain   . Nephrolithiasis    Dr [name] [name]    Past Surgical History:  Procedure Laterality Date  . KNEE SURGERY      Social History   Socioeconomic History  . Marital status: Married  Spouse name: Not on file  . Number of children: 2  . Years of education: Not on file  . Highest education level: Not on file  Occupational History  . Not on file  Social Needs  . Financial resource strain: Not on file  . Food insecurity:    Worry: Not on file    Inability: Not on file  . Transportation needs:    Medical: Not on file    Non-medical: Not on file  Tobacco Use  . Smoking status: Current Every Day Smoker    Packs/day: 2.00    Types: Cigarettes  . Smokeless tobacco: Never Used  Substance and Sexual Activity  . Alcohol use: No  . Drug use: No  . Sexual activity: Yes  Lifestyle  . Physical activity:    Days per week: Not on file    Minutes per session: Not on file  . Stress: Not on file  Relationships  . Social connections:     Talks on phone: Not on file    Gets together: Not on file    Attends religious service: Not on file    Active member of club or organization: Not on file    Attends meetings of clubs or organizations: Not on file    Relationship status: Not on file  . Intimate partner violence:    Fear of current or ex partner: Not on file    Emotionally abused: Not on file    Physically abused: Not on file    Forced sexual activity: Not on file  Other Topics Concern  . Not on file  Social History Narrative   No regular exercise    Family History  Problem Relation Age of Onset  . Hypertension Mother   . Diabetes Mother   . CAD Mother   . Heart disease Other        CAD male relative <60  . Vision loss Sister     ROS: Knee arthralgias and leg cramps but no fevers or chills, productive cough, hemoptysis, dysphasia, odynophagia, melena, hematochezia, dysuria, hematuria, rash, seizure activity, orthopnea, PND, pedal edema, claudication. Remaining systems are negative.  Physical Exam:   Blood pressure 140/84, pulse 76, height 5\' 10"  (1.778 m), weight 248 lb (112.5 kg).  General:  Well developed/well nourished in NAD Skin warm/dry Patient not depressed No peripheral clubbing Back-normal HEENT-normal/normal eyelids Neck supple/normal carotid upstroke bilaterally; no bruits; no JVD; no thyromegaly chest - CTA/ normal expansion CV - RRR/normal S1 and S2; no murmurs, rubs or gallops;  PMI nondisplaced Abdomen -NT/ND, no HSM, no mass, + bowel sounds, no bruit 2+ femoral pulses, no bruits Ext-no edema, chords, 2+ DP Neuro-grossly nonfocal  ECG -sinus rhythm at a rate of 76.  Occasional PVC.  Personally reviewed  A/P  1 dyspnea-etiology unclear.  Multiple risk factors including diabetes mellitus.  Plan Lexiscan nuclear study for risk stratification.  Patient cannot ambulate because of knee arthralgias.  Plan echocardiogram to assess LV function.  There may also be a component of COPD given  ongoing tobacco use.  There may also be a component of deconditioning.  2 hypertension-blood pressure is borderline.  Follow and advance regimen as needed with goal systolic blood pressure less than 130.  3 hyperlipidemia-continue statin.  4 tobacco abuse-patient counseled on discontinuing.  Olga Millers, [name]

## 2018-03-09 ENCOUNTER — Telehealth: Payer: Self-pay | Admitting: Internal Medicine

## 2018-03-09 NOTE — Telephone Encounter (Signed)
Copied from CRM [phone]#[id number]. Topic: General - Other >> Mar 09, 2018  1:25 PM Stephannie LiSimmons, Khalil Belote L, NT wrote: Reason for CRM: Patients wife called and said Dr Waunita Schoonerrenshaws office called and needs a referral faxed over ,the patient has an appointment tomorrow at 9:00 am

## 2018-03-09 NOTE — Telephone Encounter (Signed)
Is this something done on the referral end or something I can just fax?

## 2018-03-09 NOTE — Telephone Encounter (Signed)
Referral done and faxed to pam [facility] the [facility]. Left msg on pt's vm that this has been done

## 2018-03-09 NOTE — Telephone Encounter (Signed)
Copied from CRM [id number]. Topic: Referral - Request >> Mar 09, 2018  1:21 PM Oneal GroutSebastian, Jennifer S wrote: Reason for CRM: [facility] w/ Dr Jens Somrenshaw I10 (ICD-10-CM) - Essential hypertension E78.5 (ICD-10-CM) - Dyslipidemia Fax # [phone] Ayesha RumpfAttn Pam

## 2018-03-11 ENCOUNTER — Ambulatory Visit: Payer: 59 | Admitting: Cardiology

## 2018-03-11 ENCOUNTER — Encounter: Payer: Self-pay | Admitting: Cardiology

## 2018-03-11 VITALS — BP 140/84 | HR 76 | Ht 70.0 in | Wt 248.0 lb

## 2018-03-11 DIAGNOSIS — I10 Essential (primary) hypertension: Secondary | ICD-10-CM

## 2018-03-11 DIAGNOSIS — Z720 Tobacco use: Secondary | ICD-10-CM | POA: Diagnosis not present

## 2018-03-11 DIAGNOSIS — E7800 Pure hypercholesterolemia, unspecified: Secondary | ICD-10-CM

## 2018-03-11 DIAGNOSIS — R0602 Shortness of breath: Secondary | ICD-10-CM | POA: Diagnosis not present

## 2018-03-11 NOTE — Patient Instructions (Signed)
Your physician has requested that you have an echocardiogram - done at [address]. Echocardiography is a painless test that uses sound waves to create images of your heart. It provides your doctor with information about the size and shape of your heart and how well your heart's chambers and valves are working. This procedure takes approximately one hour. There are no restrictions for this procedure.  Dr. Genice Rougerensahw has ordered a Lexiscan Myocardial Perfusion Imaging Study - done at Dr. Ludwig Clarksrenshaw's office.  Please arrive 15 minutes prior to your appointment time for registration and insurance purposes.   The test will take approximately 3 to 4 hours to complete; you may bring reading material.  If someone comes with you to your appointment, they will need to remain in the main lobby due to limited space in the testing area.    How to prepare for your Myocardial Perfusion Test:  Do not eat or drink 3 hours prior to your test, except you may have water.  Do not consume products containing caffeine (regular or decaffeinated) 12 hours prior to your test. (ex: coffee, chocolate, sodas, tea).  Do wear comfortable clothes (no dresses or overalls) and walking shoes, tennis shoes preferred (No heels or open toe shoes are allowed).  Do NOT wear cologne, perfume, aftershave, or lotions (deodorant is allowed).  If you use an inhaler, use it the AM of your test and bring it with you.   If you use a nebulizer, use it the AM of your test.   If these instructions are not followed, your test will have to be rescheduled.  Your physician recommends that you schedule a follow-up appointment as needed - we will call you with test results.

## 2018-03-14 ENCOUNTER — Other Ambulatory Visit: Payer: Self-pay | Admitting: Internal Medicine

## 2018-03-18 ENCOUNTER — Telehealth (HOSPITAL_COMMUNITY): Payer: Self-pay

## 2018-03-18 ENCOUNTER — Ambulatory Visit (HOSPITAL_COMMUNITY): Payer: 59 | Attending: Cardiovascular Disease

## 2018-03-18 ENCOUNTER — Other Ambulatory Visit: Payer: Self-pay

## 2018-03-18 DIAGNOSIS — I10 Essential (primary) hypertension: Secondary | ICD-10-CM | POA: Diagnosis not present

## 2018-03-18 DIAGNOSIS — Z8249 Family history of ischemic heart disease and other diseases of the circulatory system: Secondary | ICD-10-CM | POA: Insufficient documentation

## 2018-03-18 DIAGNOSIS — J449 Chronic obstructive pulmonary disease, unspecified: Secondary | ICD-10-CM | POA: Insufficient documentation

## 2018-03-18 DIAGNOSIS — E119 Type 2 diabetes mellitus without complications: Secondary | ICD-10-CM | POA: Insufficient documentation

## 2018-03-18 DIAGNOSIS — R0602 Shortness of breath: Secondary | ICD-10-CM | POA: Diagnosis not present

## 2018-03-18 DIAGNOSIS — E785 Hyperlipidemia, unspecified: Secondary | ICD-10-CM | POA: Insufficient documentation

## 2018-03-18 DIAGNOSIS — Z720 Tobacco use: Secondary | ICD-10-CM | POA: Diagnosis not present

## 2018-03-18 NOTE — Telephone Encounter (Signed)
Encounter complete. 

## 2018-03-19 ENCOUNTER — Telehealth (HOSPITAL_COMMUNITY): Payer: Self-pay

## 2018-03-19 NOTE — Telephone Encounter (Signed)
Encounter complete. 

## 2018-03-23 ENCOUNTER — Ambulatory Visit (HOSPITAL_COMMUNITY)
Admission: RE | Admit: 2018-03-23 | Discharge: 2018-03-23 | Disposition: A | Discharge status: Home / Self Care | Payer: 59 | Source: Ambulatory Visit | Attending: Internal Medicine | Admitting: Internal Medicine

## 2018-03-23 DIAGNOSIS — R0602 Shortness of breath: Secondary | ICD-10-CM | POA: Diagnosis not present

## 2018-03-23 LAB — MYOCARDIAL PERFUSION IMAGING
CHL CUP RESTING HR STRESS: 74 {beats}/min
LV sys vol: 65 mL
LVDIAVOL: 171 mL (ref 62–150)
NUC STRESS TID: 0.93
Peak HR: 121 {beats}/min
SDS: 1
SRS: 0
SSS: 1

## 2018-03-23 MED ORDER — TECHNETIUM TC 99M TETROFOSMIN IV KIT
10.2000 | PACK | Freq: Once | INTRAVENOUS | Status: AC | PRN
  Administered 2018-03-23: 10.2 via INTRAVENOUS
  Filled 2018-03-23: qty 11

## 2018-03-23 MED ORDER — REGADENOSON 0.4 MG/5ML IV SOLN
0.4000 mg | Freq: Once | INTRAVENOUS | Status: AC
  Administered 2018-03-23: 0.4 mg via INTRAVENOUS

## 2018-03-23 MED ORDER — TECHNETIUM TC 99M TETROFOSMIN IV KIT
32.4000 | PACK | Freq: Once | INTRAVENOUS | Status: AC | PRN
  Administered 2018-03-23: 32.4 via INTRAVENOUS
  Filled 2018-03-23: qty 33

## 2018-03-29 ENCOUNTER — Other Ambulatory Visit: Payer: Self-pay | Admitting: Endocrinology

## 2018-04-07 ENCOUNTER — Ambulatory Visit: Payer: 59 | Admitting: Neurology

## 2018-04-07 ENCOUNTER — Encounter: Payer: Self-pay | Admitting: Neurology

## 2018-04-07 VITALS — BP 155/86 | HR 87 | Ht 70.0 in | Wt 245.0 lb

## 2018-04-07 DIAGNOSIS — G4701 Insomnia due to medical condition: Secondary | ICD-10-CM

## 2018-04-07 DIAGNOSIS — R351 Nocturia: Secondary | ICD-10-CM

## 2018-04-07 DIAGNOSIS — G4719 Other hypersomnia: Secondary | ICD-10-CM

## 2018-04-07 DIAGNOSIS — E1165 Type 2 diabetes mellitus with hyperglycemia: Secondary | ICD-10-CM | POA: Insufficient documentation

## 2018-04-07 DIAGNOSIS — J449 Chronic obstructive pulmonary disease, unspecified: Secondary | ICD-10-CM

## 2018-04-07 DIAGNOSIS — J4489 Other specified chronic obstructive pulmonary disease: Secondary | ICD-10-CM | POA: Insufficient documentation

## 2018-04-07 DIAGNOSIS — G894 Chronic pain syndrome: Secondary | ICD-10-CM

## 2018-04-07 DIAGNOSIS — G8929 Other chronic pain: Secondary | ICD-10-CM | POA: Diagnosis not present

## 2018-04-07 NOTE — Patient Instructions (Signed)

## 2018-04-07 NOTE — Progress Notes (Signed)
SLEEP MEDICINE CLINIC   Provider:  Melvyn Novasarmen  Jull [place]ral, [place]  Primary Care Physician:  Tresa GarterPlotnikov, Aleksei V, MD   Referring Provider: Tresa GarterPlotnikov, Aleksei V, MD   Chief Complaint  Patient presents with  . New Patient (Initial Visit)    pt with wife, rm 10. pt states that unable to sleep well and feeling very tired. he has noticied he has troubles with leg cramps that awake him during the night. denies apnea or snoring. never had a sleep study.      Chief Complaint  Patient presents with  . New Patient (Initial Visit)    pt with wife, rm 10. pt states that unable to sleep well and feeling very tired. he has noticied he has troubles with leg cramps that awake him during the night. denies apnea or snoring. never had a sleep study.     HPI:  [name] [name] is a 52 y.o. male patient, seen here with is wife [name] [name] in a referral from Dr. Posey ReaPlotnikov for evaluation of excessive daytime sleepiness,  increasing over the last 2 years;  Mr. Antony MaduraRutledge has a long-standing history of chronic pain, and he has been taking morphine MS Contin for over 15 years.  He stated that the medication never left him excessively daytime sleepy and is therefore not convinced that the medication now would cause excessive daytime sleepiness.  He is also on Toradol, he is an insulin-dependent diabetic, he is an active smoker and he is treated for hypertension.  He is taking a baby aspirin daily.  Blood pressure has been around 130 systolic over the last visits, his weight has been stable but his BMI remains elevated at about 36,   laboratory testing with Dr. Loren RacerPlotnikov's office showed no evidence of anemia, a fasting glucose was 90 creatinine 0.9, TSH was elevated at 5.03 and his hemoglobin A1c was at 10.1 (!).  A chest x-ray was last obtained in 2015, recently elevated levels of fatigue have been evaluated by Dr. Jens Somrenshaw /cardiology with a medicine induced stress test and normal.  Chief complaint according to patient : " I  fall asleep and do not stay asleep "   Sleep habits are as follows: he returns from work about 5.30 PM, 6 Pm is supper time, and afterwards watches TV , seated on a sofa. He sleeps in a bed - goes between 9-10 PM, the bedroom is cool, but TV is on, all night, every night - not quiet and dark.  He feels that he gets easier to sleep and may be more restful sleep when he is in a seated position on his couch rather than on a flat mattress.  He prefers sleeping on his side, on 3 pillows for head and chest support.  He is not quite sure if it actually helps him breathing deeper he just feels more comfortable.  He has 2-3 bathroom breaks- disrupting his sleep, as well as his leg cramps- He is usually asleep again until he is woken up by his wife which is usually around 4.30, he is at work at 5.30AM    Sleep medical history and family sleep history:  Leg cramping since childhood. Mother massaged his legs, thought is was growing pains in calf and thighs.    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  Smoking since teenage- 40 years- and unable to quit. 2 ppd.  ETOH none - caffeine- 6 -8 colas, mostly soda, no iced tea, rarely coffee.  Review of Systems: Out of a complete 14 system review, the patient complains of only the following symptoms, and all other reviewed systems are negative.   Epworth score  9/ 24 , Fatigue severity score 37/63 , depression score increased .     Social History   Socioeconomic History  . Marital status: Married    Spouse name: Not on file  . Number of children: 2  . Years of education: Not on file  . Highest education level: Not on file  Occupational History  . Not on file  Social Needs  . Financial resource strain: Not on file  . Food insecurity:    Worry: Not on file    Inability: Not on file  . Transportation needs:    Medical: Not on file    Non-medical: Not on file  Tobacco Use  . Smoking status: Current Every Day  Smoker    Packs/day: 2.00    Types: Cigarettes  . Smokeless tobacco: Never Used  Substance and Sexual Activity  . Alcohol use: No  . Drug use: No  . Sexual activity: Yes  Lifestyle  . Physical activity:    Days per week: Not on file    Minutes per session: Not on file  . Stress: Not on file  Relationships  . Social connections:    Talks on phone: Not on file    Gets together: Not on file    Attends religious service: Not on file    Active member of club or organization: Not on file    Attends meetings of clubs or organizations: Not on file    Relationship status: Not on file  . Intimate partner violence:    Fear of current or ex partner: Not on file    Emotionally abused: Not on file    Physically abused: Not on file    Forced sexual activity: Not on file  Other Topics Concern  . Not on file  Social History Narrative   No regular exercise    Family History  Problem Relation Age of Onset  . Hypertension Mother   . Diabetes Mother   . CAD Mother   . Heart disease Other        CAD male relative <60  . Vision loss Sister     Past Medical History:  Diagnosis Date  . COPD (chronic obstructive pulmonary disease) (HCC)   . Diabetes mellitus   . GERD (gastroesophageal reflux disease)   . Hyperlipidemia   . Hypertension   . Lower back pain   . Nephrolithiasis    Dr Annabell Howells    Past Surgical History:  Procedure Laterality Date  . KNEE SURGERY      Current Outpatient Medications  Medication Sig Dispense Refill  . amLODipine-valsartan (EXFORGE) 10-320 MG tablet Take 1 tablet by mouth daily. 90 tablet 3  . aspirin EC 81 MG tablet Take 81 mg by mouth daily.    . B-D UF III MINI PEN NEEDLES 31G X 5 MM MISC USE AS DIRECTED 100 each 3  . Cholecalciferol (VITAMIN D3) 1000 UNITS CAPS Take 1 capsule by mouth daily.      Marland Kitchen erythromycin ophthalmic ointment Place 1 application into the right eye 4 (four) times daily. 3.5 g 1  . fluticasone (FLONASE) 50 MCG/ACT nasal spray  PLACE 2 SPRAYS INTO BOTH NOSTRILS DAILY. 18 g 5  . glucose blood (ONE TOUCH ULTRA TEST) test strip USE TO CHECK BLOOD SUGARS TWICE A DAY. DX E11.9 100 each 3  .  Insulin Glargine (BASAGLAR KWIKPEN) 100 UNIT/ML SOPN INJECT 100 UNITS INTO THE SKIN EVERY MORNING. 90 pen 1  . insulin lispro (HUMALOG KWIKPEN) 100 UNIT/ML KiwkPen Inject 20 units with supper. 15 mL 2  . Insulin Pen Needle (RELION PEN NEEDLE 31G/8MM) 31G X 8 MM MISC USE AS DIRECTED 3 TIMES A DAY 100 each 0  . ketorolac (TORADOL) 10 MG tablet Take 1 tablet (10 mg total) by mouth every 8 (eight) hours as needed for severe pain (renal colic). 20 tablet 0  . lovastatin (MEVACOR) 20 MG tablet TAKE 1 TABLET (20 MG TOTAL) BY MOUTH DAILY. 90 tablet 3  . morphine (MS CONTIN) 100 MG 12 hr tablet Take 2 tablets (200 mg total) by mouth 3 (three) times daily. 180 tablet 0  . morphine (MSIR) 15 MG tablet Take 1 tablet (15 mg total) by mouth 2 (two) times daily as needed for severe pain. 60 tablet 0  . ONETOUCH DELICA LANCETS 33G MISC USE TO CHECK BLOOD SUGAR TWICE DAILY 100 each 2  . RELION PEN NEEDLE 31G/8MM 31G X 8 MM MISC USE AS DIRECTED THREE TIMES DAILY 100 each 11  . RELION SHORT PEN NEEDLES 31G X 8 MM MISC USE ONE NEEDLE TO INJECT INSULIN THREE TIMES DAILY 100 each 0  . umeclidinium-vilanterol (ANORO ELLIPTA) 62.5-25 MCG/INH AEPB Inhale 1 puff into the lungs daily. (Patient not taking: Reported on 04/07/2018) 1 each 11   No current facility-administered medications for this visit.     Allergies as of 04/07/2018 - Review Complete 04/07/2018  Allergen Reaction Noted  . Belviq [lorcaserin hcl]  08/01/2014  . Benazepril hcl  04/05/2008  . Nsaids      Vitals: BP (!) 155/86   Pulse 87   Ht 5\' 10"  (1.778 m)   Wt 245 lb (111.1 kg)   BMI 35.15 kg/m  Last Weight:  Wt Readings from Last 1 Encounters:  04/07/18 245 lb (111.1 kg)   WUJ:WJXB mass index is 35.15 kg/m.     Last Height:   Ht Readings from Last 1 Encounters:  04/07/18 5\' 10"  (1.778  m)    Physical exam:  General: The patient is awake, alert and appears not in acute distress. The patient is well groomed. Head: Normocephalic, atraumatic. Neck is supple. Mallampati 5  neck circumference 19" . Nasal airflow congestion- ,sinus congestion.  Retrognathia is not seen.  Cardiovascular:  Regular rate and rhythm , without  murmurs or carotid bruit, and without distended neck veins. Respiratory: Rhonci to auscultation. Skin:  Without evidence of edema, or rash Trunk: BMI is 35 . The patient's posture is stooped   Neurologic exam : The patient is awake and alert, oriented to place and time.    Attention span & concentration ability appears normal.  Speech is fluent,    Cranial nerves: Pupils are equal and briskly reactive to light.  Extraocular movements  in vertical and horizontal planes intact and without nystagmus. Visual fields by finger perimetry are intact. Hearing to finger rub intact.  Facial sensation intact to fine touch. Facial motor strength is symmetric and tongue and uvula move midline. Shoulder shrug was symmetrical.   Motor exam: elevated biceps  tone,  Symmetric muscle bulk and symmetric strength in all extremities. Strong grip. During exam the patient suffered leg spasms. He was unable to relax.  Sensory:  Fine touch, pinprick and vibration were tested in all extremities. Proprioception tested in the upper extremities was normal. Coordination: Rapid alternating movements in the fingers/hands was normal.  Finger-to-nose maneuver  normal without evidence of ataxia, dysmetria or tremor. Gait and station: Patient walks without assistive device .Tandem gait is unfragmented. Turns with 4 Steps.  Deep tendon reflexes: in the  upper and lower extremities are symmetric and intact.   Assessment:  After physical and neurologic examination, review of laboratory studies,  Personal review of imaging studies, reports of other /same  Imaging studies, results of polysomnography  and / or neurophysiology testing and pre-existing records as far as provided in visit., my assessment is   1)  Hypersomnia and fragmented sleep- nocturia , mild snoring reported only, no apneas witnessed.    2) chronic pain, DDD, joint pain, leg spasms. However insomnia is new onset , pain has been chronic long before.  3) Anxiety - on narcotics. High caffeine, wife has insomnia, both have poor sleep habits and hygiene. TV needs to be off.    The patient was advised of the nature of the diagnosed disorder , the treatment options and the  risks for general health and wellness arising from not treating the condition.   I spent more than 55  minutes of face to face time with the patient.  Greater than 50% of time was spent in counseling and coordination of care. We have discussed the diagnosis and differential and I answered the patient's questions.    Plan:  Treatment plan and additional workup :  Attended sleep study , evaluate for central versus OSA , on narcotics, chronic pain.  No dreams- no sleep paralysis. No naps in daytime but as soon as he is inactive.  Improve sleep hygiene- all electronics off 30 minute, dim the lights - keep a bed time and a rise time.  turns clock around - may need to treat anxiety.   Melvyn Novas, MD 04/07/2018, 3:55 PM  Certified in Neurology by ABPN Certified in Sleep Medicine by [facility] [address] [place], [place] [address]

## 2018-04-23 ENCOUNTER — Ambulatory Visit: Payer: 59 | Admitting: Internal Medicine

## 2018-04-23 ENCOUNTER — Encounter: Payer: Self-pay | Admitting: Internal Medicine

## 2018-04-23 ENCOUNTER — Other Ambulatory Visit (INDEPENDENT_AMBULATORY_CARE_PROVIDER_SITE_OTHER): Payer: 59

## 2018-04-23 VITALS — BP 144/72 | HR 61 | Temp 98.5°F | Ht 70.0 in | Wt 240.0 lb

## 2018-04-23 DIAGNOSIS — M79604 Pain in right leg: Secondary | ICD-10-CM

## 2018-04-23 DIAGNOSIS — R7989 Other specified abnormal findings of blood chemistry: Secondary | ICD-10-CM

## 2018-04-23 DIAGNOSIS — I10 Essential (primary) hypertension: Secondary | ICD-10-CM | POA: Diagnosis not present

## 2018-04-23 DIAGNOSIS — Z0000 Encounter for general adult medical examination without abnormal findings: Secondary | ICD-10-CM | POA: Diagnosis not present

## 2018-04-23 DIAGNOSIS — J449 Chronic obstructive pulmonary disease, unspecified: Secondary | ICD-10-CM | POA: Diagnosis not present

## 2018-04-23 DIAGNOSIS — M79605 Pain in left leg: Secondary | ICD-10-CM

## 2018-04-23 DIAGNOSIS — E1165 Type 2 diabetes mellitus with hyperglycemia: Secondary | ICD-10-CM | POA: Diagnosis not present

## 2018-04-23 DIAGNOSIS — M545 Low back pain: Secondary | ICD-10-CM | POA: Diagnosis not present

## 2018-04-23 DIAGNOSIS — G4719 Other hypersomnia: Secondary | ICD-10-CM

## 2018-04-23 LAB — CBC WITH DIFFERENTIAL/PLATELET
Basophils Absolute: 0.1 10*3/uL (ref 0.0–0.1)
Basophils Relative: 0.7 % (ref 0.0–3.0)
Eosinophils Absolute: 0.3 10*3/uL (ref 0.0–0.7)
Eosinophils Relative: 3.2 % (ref 0.0–5.0)
HCT: 44 % (ref 39.0–52.0)
Hemoglobin: 15 g/dL (ref 13.0–17.0)
Lymphocytes Relative: 23.7 % (ref 12.0–46.0)
Lymphs Abs: 2.4 10*3/uL (ref 0.7–4.0)
MCHC: 34.2 g/dL (ref 30.0–36.0)
MCV: 88.7 fl (ref 78.0–100.0)
Monocytes Absolute: 0.9 10*3/uL (ref 0.1–1.0)
Monocytes Relative: 8.4 % (ref 3.0–12.0)
Neutro Abs: 6.6 10*3/uL (ref 1.4–7.7)
Neutrophils Relative %: 64 % (ref 43.0–77.0)
Platelets: 206 10*3/uL (ref 150.0–400.0)
RBC: 4.96 Mil/uL (ref 4.22–5.81)
RDW: 13.2 % (ref 11.5–15.5)
WBC: 10.3 10*3/uL (ref 4.0–10.5)

## 2018-04-23 LAB — MICROALBUMIN / CREATININE URINE RATIO
Creatinine,U: 81.2 mg/dL
MICROALB/CREAT RATIO: 2 mg/g (ref 0.0–30.0)
Microalb, Ur: 1.7 mg/dL (ref 0.0–1.9)

## 2018-04-23 LAB — URINALYSIS, ROUTINE W REFLEX MICROSCOPIC
BILIRUBIN URINE: NEGATIVE
Ketones, ur: NEGATIVE
NITRITE: NEGATIVE
Specific Gravity, Urine: 1.015 (ref 1.000–1.030)
TOTAL PROTEIN, URINE-UPE24: NEGATIVE
Urine Glucose: NEGATIVE
Urobilinogen, UA: 0.2 (ref 0.0–1.0)
pH: 6 (ref 5.0–8.0)

## 2018-04-23 LAB — TSH: TSH: 2.64 u[IU]/mL (ref 0.35–4.50)

## 2018-04-23 LAB — HEPATIC FUNCTION PANEL
ALT: 33 U/L (ref 0–53)
AST: 24 U/L (ref 0–37)
Albumin: 4.2 g/dL (ref 3.5–5.2)
Alkaline Phosphatase: 67 U/L (ref 39–117)
Bilirubin, Direct: 0.1 mg/dL (ref 0.0–0.3)
Total Bilirubin: 0.4 mg/dL (ref 0.2–1.2)
Total Protein: 7.1 g/dL (ref 6.0–8.3)

## 2018-04-23 LAB — LIPID PANEL
CHOL/HDL RATIO: 3
Cholesterol: 127 mg/dL (ref 0–200)
HDL: 44.7 mg/dL (ref >39.00)
LDL Cholesterol: 66 mg/dL (ref 0–99)
NONHDL: 82.75
Triglycerides: 86 mg/dL (ref 0.0–149.0)
VLDL: 17.2 mg/dL (ref 0.0–40.0)

## 2018-04-23 LAB — T3, FREE: T3, Free: 3.7 pg/mL (ref 2.3–4.2)

## 2018-04-23 LAB — BASIC METABOLIC PANEL WITH GFR
BUN: 18 mg/dL (ref 6–23)
CO2: 30 meq/L (ref 19–32)
Calcium: 9.5 mg/dL (ref 8.4–10.5)
Chloride: 100 meq/L (ref 96–112)
Creatinine, Ser: 0.83 mg/dL (ref 0.40–1.50)
GFR: 102.7 mL/min
Glucose, Bld: 150 mg/dL — ABNORMAL HIGH (ref 70–99)
Potassium: 3.9 meq/L (ref 3.5–5.1)
Sodium: 135 meq/L (ref 135–145)

## 2018-04-23 LAB — T4, FREE: Free T4: 0.79 ng/dL (ref 0.60–1.60)

## 2018-04-23 LAB — PSA: PSA: 0.2 ng/mL (ref 0.10–4.00)

## 2018-04-23 MED ORDER — UMECLIDINIUM-VILANTEROL 62.5-25 MCG/INH IN AEPB: 1.0000 | INHALATION_SPRAY | Freq: Every day | RESPIRATORY_TRACT | 3 refills | Status: DC

## 2018-04-23 MED ORDER — MORPHINE SULFATE 15 MG PO TABS: 15.0000 mg | ORAL_TABLET | Freq: Two times a day (BID) | ORAL | 0 refills | Status: DC | PRN

## 2018-04-23 MED ORDER — LOVASTATIN 20 MG PO TABS: 20.0000 mg | ORAL_TABLET | Freq: Every day | ORAL | 3 refills | Status: DC

## 2018-04-23 MED ORDER — MORPHINE SULFATE ER 100 MG PO TBCR: 200.0000 mg | EXTENDED_RELEASE_TABLET | Freq: Three times a day (TID) | ORAL | 0 refills | Status: DC

## 2018-04-23 NOTE — Assessment & Plan Note (Signed)
F/u w/Dr Dohmeier 

## 2018-04-23 NOTE — Progress Notes (Signed)
Subjective:  Patient ID: [name] [name], male    DOB: 1965-12-07  Age: 52 y.o. MRN: [id number]  CC: No chief complaint on file.   HPI [name] [name] presents for LBP, DM, HTN f/u Loosing wt on diet  Outpatient Medications Prior to Visit  Medication Sig Dispense Refill  . amLODipine-valsartan (EXFORGE) 10-320 MG tablet Take 1 tablet by mouth daily. 90 tablet 3  . aspirin EC 81 MG tablet Take 81 mg by mouth daily.    . B-D UF III MINI PEN NEEDLES 31G X 5 MM MISC USE AS DIRECTED 100 each 3  . Cholecalciferol (VITAMIN D3) 1000 UNITS CAPS Take 1 capsule by mouth daily.      Marland Kitchen erythromycin ophthalmic ointment Place 1 application into the right eye 4 (four) times daily. 3.5 g 1  . fluticasone (FLONASE) 50 MCG/ACT nasal spray PLACE 2 SPRAYS INTO BOTH NOSTRILS DAILY. 18 g 5  . glucose blood (ONE TOUCH ULTRA TEST) test strip USE TO CHECK BLOOD SUGARS TWICE A DAY. DX E11.9 100 each 3  . Insulin Glargine (BASAGLAR KWIKPEN) 100 UNIT/ML SOPN INJECT 100 UNITS INTO THE SKIN EVERY MORNING. 90 pen 1  . insulin lispro (HUMALOG KWIKPEN) 100 UNIT/ML KiwkPen Inject 20 units with supper. 15 mL 2  . Insulin Pen Needle (RELION PEN NEEDLE 31G/8MM) 31G X 8 MM MISC USE AS DIRECTED 3 TIMES A DAY 100 each 0  . ketorolac (TORADOL) 10 MG tablet Take 1 tablet (10 mg total) by mouth every 8 (eight) hours as needed for severe pain (renal colic). 20 tablet 0  . lovastatin (MEVACOR) 20 MG tablet TAKE 1 TABLET (20 MG TOTAL) BY MOUTH DAILY. 90 tablet 3  . morphine (MS CONTIN) 100 MG 12 hr tablet Take 2 tablets (200 mg total) by mouth 3 (three) times daily. 180 tablet 0  . morphine (MSIR) 15 MG tablet Take 1 tablet (15 mg total) by mouth 2 (two) times daily as needed for severe pain. 60 tablet 0  . ONETOUCH DELICA LANCETS 33G MISC USE TO CHECK BLOOD SUGAR TWICE DAILY 100 each 2  . RELION PEN NEEDLE 31G/8MM 31G X 8 MM MISC USE AS DIRECTED THREE TIMES DAILY 100 each 11  . RELION SHORT PEN NEEDLES 31G X 8 MM MISC USE ONE  NEEDLE TO INJECT INSULIN THREE TIMES DAILY 100 each 0  . umeclidinium-vilanterol (ANORO ELLIPTA) 62.5-25 MCG/INH AEPB Inhale 1 puff into the lungs daily. 1 each 11   No facility-administered medications prior to visit.     ROS: Review of Systems  Constitutional: Positive for fatigue. Negative for appetite change and unexpected weight change.  HENT: Negative for congestion, nosebleeds, sneezing, sore throat and trouble swallowing.   Eyes: Negative for itching and visual disturbance.  Respiratory: Negative for cough.   Cardiovascular: Negative for chest pain, palpitations and leg swelling.  Gastrointestinal: Negative for abdominal distention, blood in stool, diarrhea and nausea.  Genitourinary: Negative for frequency and hematuria.  Musculoskeletal: Positive for arthralgias and back pain. Negative for gait problem, joint swelling and neck pain.  Skin: Negative for rash.  Neurological: Negative for dizziness, tremors, speech difficulty and weakness.  Psychiatric/Behavioral: Negative for agitation, dysphoric mood, sleep disturbance and suicidal ideas. The patient is not nervous/anxious.     Objective:  BP (!) 144/72 (BP Location: Left Arm, Patient Position: Sitting, Cuff Size: Large)   Pulse 61   Temp 98.5 F (36.9 C) (Oral)   Ht 5\' 10"  (1.778 m)   Wt 240 lb (108.9  kg)   SpO2 97%   BMI 34.44 kg/m   BP Readings from Last 3 Encounters:  04/23/18 (!) 144/72  04/07/18 (!) 155/86  03/11/18 140/84    Wt Readings from Last 3 Encounters:  04/23/18 240 lb (108.9 kg)  04/07/18 245 lb (111.1 kg)  03/23/18 248 lb (112.5 kg)    Physical Exam  Constitutional: He is oriented to person, place, and time. He appears well-developed. No distress.  NAD  HENT:  Mouth/Throat: Oropharynx is clear and moist.  Eyes: Pupils are equal, round, and reactive to light. Conjunctivae are normal.  Neck: Normal range of motion. No JVD present. No thyromegaly present.  Cardiovascular: Normal rate, regular  rhythm, normal heart sounds and intact distal pulses. Exam reveals no gallop and no friction rub.  No murmur heard. Pulmonary/Chest: Effort normal and breath sounds normal. No respiratory distress. He has no wheezes. He has no rales. He exhibits no tenderness.  Abdominal: Soft. Bowel sounds are normal. He exhibits no distension and no mass. There is no tenderness. There is no rebound and no guarding.  Musculoskeletal: Normal range of motion. He exhibits no edema or tenderness.  Lymphadenopathy:    He has no cervical adenopathy.  Neurological: He is alert and oriented to person, place, and time. He has normal reflexes. No cranial nerve deficit. He exhibits normal muscle tone. He displays a negative Romberg sign. Coordination and gait normal.  Skin: Skin is warm and dry. No rash noted.  Psychiatric: He has a normal mood and affect. His behavior is normal. Judgment and thought content normal.  excoriations B forearms LS tender  Lab Results  Component Value Date   WBC 10.4 04/22/2017   HGB 14.6 04/22/2017   HCT 43.0 04/22/2017   PLT 219.0 04/22/2017   GLUCOSE 377 (H) 01/22/2018   CHOL 130 04/22/2017   TRIG 94.0 04/22/2017   HDL 42.70 04/22/2017   LDLDIRECT 116.6 05/20/2007   LDLCALC 68 04/22/2017   ALT 19 04/22/2017   AST 19 04/22/2017   NA 132 (L) 01/22/2018   K 4.3 01/22/2018   CL 99 01/22/2018   CREATININE 0.86 01/22/2018   BUN 14 01/22/2018   CO2 27 01/22/2018   TSH 5.03 (H) 04/22/2017   PSA 0.22 04/22/2017   INR 1.1 05/10/2008   HGBA1C 10.0 (H) 01/22/2018   MICROALBUR <0.7 10/17/2015    No results found.  Assessment & Plan:   There are no diagnoses linked to this encounter.   No orders of the defined types were placed in this encounter.    Follow-up: No follow-ups on file.  Sonda PrimesAlex Sophiamarie Nease, MD

## 2018-04-23 NOTE — Assessment & Plan Note (Signed)
Anoro  °

## 2018-04-23 NOTE — Assessment & Plan Note (Signed)
On Bystolic, Amlod-Valsartan 

## 2018-04-23 NOTE — Assessment & Plan Note (Signed)
On MSIR, MSContin   Potential benefits of a long term opioids  use as well as potential risks  and complications were explained to the patient and were aknowledged. 

## 2018-04-23 NOTE — Assessment & Plan Note (Signed)
Better w/wt loss Humalog, Basaglar 

## 2018-04-26 ENCOUNTER — Other Ambulatory Visit (INDEPENDENT_AMBULATORY_CARE_PROVIDER_SITE_OTHER): Payer: 59

## 2018-04-26 DIAGNOSIS — E1165 Type 2 diabetes mellitus with hyperglycemia: Secondary | ICD-10-CM

## 2018-04-26 DIAGNOSIS — E1129 Type 2 diabetes mellitus with other diabetic kidney complication: Secondary | ICD-10-CM

## 2018-04-26 DIAGNOSIS — IMO0002 Reserved for concepts with insufficient information to code with codable children: Secondary | ICD-10-CM

## 2018-04-26 LAB — HEMOGLOBIN A1C: Hgb A1c MFr Bld: 9.3 % — ABNORMAL HIGH (ref 4.6–6.5)

## 2018-05-07 ENCOUNTER — Ambulatory Visit (INDEPENDENT_AMBULATORY_CARE_PROVIDER_SITE_OTHER): Payer: 59 | Admitting: Neurology

## 2018-05-07 DIAGNOSIS — G4733 Obstructive sleep apnea (adult) (pediatric): Secondary | ICD-10-CM | POA: Diagnosis not present

## 2018-05-07 DIAGNOSIS — E1165 Type 2 diabetes mellitus with hyperglycemia: Secondary | ICD-10-CM

## 2018-05-07 DIAGNOSIS — G4719 Other hypersomnia: Secondary | ICD-10-CM

## 2018-05-07 DIAGNOSIS — R351 Nocturia: Secondary | ICD-10-CM

## 2018-05-07 DIAGNOSIS — G8929 Other chronic pain: Secondary | ICD-10-CM

## 2018-05-07 DIAGNOSIS — G4701 Insomnia due to medical condition: Secondary | ICD-10-CM

## 2018-05-07 DIAGNOSIS — G894 Chronic pain syndrome: Secondary | ICD-10-CM

## 2018-05-07 DIAGNOSIS — J449 Chronic obstructive pulmonary disease, unspecified: Secondary | ICD-10-CM

## 2018-05-11 ENCOUNTER — Telehealth: Payer: Self-pay | Admitting: Neurology

## 2018-05-11 NOTE — Telephone Encounter (Signed)
I called pt. I advised pt that Dr. Dohmeier reviewed their sleep study results and found that severe sleep apnea and recommends that pt be treated with a cpap. Dr. Dohmeier recommends that pt return for a repeat sleep study in order to properly titrate the cpap and ensure a good mask fit. Pt is agreeable to returning for a titration study. I advised pt that our sleep lab will file with pt's insurance and call pt to schedule the sleep study when we hear back from the pt's insurance regarding coverage of this sleep study. Pt verbalized understanding of results. Pt had no questions at this time but was encouraged to call back if questions arise.     

## 2018-05-11 NOTE — Procedures (Signed)
PATIENT'S NAME:  [name] [name] [name], [name] [name] DOB:      [birth year]      MR#:    [id number]     DATE OF RECORDING: 05/07/2018 REFERRING M.D.:  [name] Shoe, MD Study Performed:   Baseline Polysomnogram HISTORY:  52 year old male patient with severe leg cramping, irregular heart beats and loud snoring, here to confirm and treat for SA. Reports leg pain waking him from sleep, uses narcotic pain medication, sleeps on multiple pillows, and is still smoking.  Pain related Insomnia, COPD, Diabetes, GERD, Hyperlipidemia, Hypertension, and Nephrolithiasis. The patient endorsed the Epworth Sleepiness Scale at 9/24 points.  The patient's weight 245 pounds with a height of 70 (inches), resulting in a BMI of 35. Kg/m2. The patient's neck circumference measured 19 inches.  CURRENT MEDICATIONS: Exforge, Aspirin, B-D Pen Needles, Vitamin D, Erythromycin Opthalmic Ointment, Flonse, One Touch Ultra Test, Basaglar Kwikpen, Humalog, Relion, Toradol, Mevacor, MS Contin, MSIR, Onetouch Delica Lancets, Anoro Ellipta.   PROCEDURE:  This is a multichannel digital polysomnogram utilizing the Somnostar 11.2 system.  Electrodes and sensors were applied and monitored per AASM Specifications.   EEG, EOG, Chin and Limb EMG, were sampled at 200 Hz.  ECG, Snore and Nasal Pressure, Thermal Airflow, Respiratory Effort, CPAP Flow and Pressure, Oximetry was sampled at 50 Hz. Digital video and audio were recorded.      BASELINE STUDY: Lights Out was at 22:56 and Lights On at 05:02.  Total recording time (TRT) was 366.5 minutes, with a total sleep time (TST) of 149.5 minutes. The patient's sleep latency was 38 minutes.  REM latency was 0 minutes.  The sleep efficiency was very poor at 40.8 %.     SLEEP ARCHITECTURE: WASO (Wake after sleep onset) was 96.5 minutes.  There were 10.5 minutes in Stage N1, 111 minutes Stage N2, 28 minutes Stage N3 and 0 minutes in Stage REM.  The percentage of Stage N1 was 7.%, Stage N2 was 74.2%, Stage N3 was 18.7%  and Stage R (REM sleep) was 0%.  The arousals were noted as: 33 were spontaneous, 0 were associated with PLMs, and 24 were associated with respiratory events.  RESPIRATORY ANALYSIS:  There were a total of 81 respiratory events:  31 obstructive apneas, 6 central apneas and 1 mixed apnea with a total of 38 apneas and an apnea index (AI) of 15.3 /hour. There were 43 hypopneas with a hypopnea index of 17.3 /hour. The patient also had 0 respiratory event related arousals (RERAs).  The total APNEA/HYPOPNEA INDEX (AHI) was 32.50/hour and the total RESPIRATORY DISTURBANCE INDEX was 32.5 /hour.  0 events occurred in REM sleep and 93 events in NREM. The REM AHI was 0. 0 /hour, versus a non-REM AHI of 32.5. The patient spent 3.5 minutes of total sleep time in the supine position and 146 minutes in non-supine. The supine AHI was 102.9 versus a non-supine AHI of 30.9.  OXYGEN SATURATION & C02:  The Wake baseline 02 saturation was 94%, with the lowest being 83%. Time spent below 89% saturation equaled 57 minutes.  Average End Tidal CO2 during sleep was not recorded.    AROUSALS/ PERIODIC LIMB MOVEMENTS:  The patient had a total of 0 Periodic Limb Movements.   The arousals were noted as: 33 were spontaneous, 0 were associated with PLMs, and 24 were associated with respiratory events.  Audio and video analysis did not show any abnormal or unusual movements, behaviors, phonations or vocalizations.  The patient was restless, had to walk, and reported  leg cramping and pain. The patient took several bathroom breaks. Snoring was noted. EKG was irregular - Post-study, the patient indicated that sleep was worse than usual.   IMPRESSION:  1. Severe and mainly Obstructive Sleep Apnea (OSA AHI 32.5/h) with very loud snoring- worse in supine. 2. Prolonged hypoxemia- suspicious for COPD/ emphysema.  3. Cramping pain in both legs woke patient multiple times. 4. Nocturia  5. Non-specific abnormal  EKG  RECOMMENDATIONS:  1. Advise full-night, attended, CPAP titration study to optimize therapy. Patient to bring his pain medication. 2. Use oxygen if hypoxemia persists on CPAP of 10 cm water pressure or more.   3. Insomnia due to unsuccessful chronic pain 1) reevaluate with management- 2) claudication evaluation- 3)rule out myelopathy.    I certify that I have reviewed the entire raw data recording prior to the issuance of this report in accordance with the Standards of Accreditation of the American Academy of Sleep Medicine (AASM)    Melvyn Novas, MD     05-11-2018  Diplomat, American Board of Psychiatry and Neurology  Diplomat, American Board of Sleep Medicine Medical Director, [facility] [employer]

## 2018-05-11 NOTE — Addendum Note (Signed)
Addended by: Melvyn Novas on: 05/11/2018 04:03 PM   Modules accepted: Orders

## 2018-05-15 ENCOUNTER — Other Ambulatory Visit: Payer: Self-pay | Admitting: Internal Medicine

## 2018-06-01 ENCOUNTER — Other Ambulatory Visit: Payer: Self-pay | Admitting: Internal Medicine

## 2018-06-03 ENCOUNTER — Other Ambulatory Visit: Payer: Self-pay | Admitting: Internal Medicine

## 2018-06-14 ENCOUNTER — Other Ambulatory Visit: Payer: Self-pay | Admitting: Endocrinology

## 2018-06-14 NOTE — Telephone Encounter (Signed)
Please refill x 1 Ov is due  

## 2018-06-21 ENCOUNTER — Ambulatory Visit (INDEPENDENT_AMBULATORY_CARE_PROVIDER_SITE_OTHER): Payer: 59 | Admitting: Internal Medicine

## 2018-06-21 ENCOUNTER — Encounter: Payer: Self-pay | Admitting: Internal Medicine

## 2018-06-21 VITALS — BP 132/74 | HR 89 | Temp 98.4°F | Ht 70.0 in | Wt 244.0 lb

## 2018-06-21 DIAGNOSIS — Z23 Encounter for immunization: Secondary | ICD-10-CM

## 2018-06-21 DIAGNOSIS — M25562 Pain in left knee: Secondary | ICD-10-CM

## 2018-06-21 DIAGNOSIS — M25569 Pain in unspecified knee: Secondary | ICD-10-CM | POA: Insufficient documentation

## 2018-06-21 DIAGNOSIS — R609 Edema, unspecified: Secondary | ICD-10-CM | POA: Diagnosis not present

## 2018-06-21 NOTE — Assessment & Plan Note (Signed)
Try Exforge 1/2 tab to see if better Watch BP

## 2018-06-21 NOTE — Addendum Note (Signed)
Addended by: Scarlett Presto on: 06/21/2018 04:34 PM   Modules accepted: Orders

## 2018-06-21 NOTE — Progress Notes (Signed)
Subjective:  Patient ID: [name] [name], male    DOB: 05/17/1966  Age: 52 y.o. MRN: [id number]  CC: No chief complaint on file.   HPI DAYVEON HALLEY presents for L knee pain x 1 mo - twisted 1 mo ago after getting off a ladder. Ice and advil did not help... C/o B ankles swelling L>R   Outpatient Medications Prior to Visit  Medication Sig Dispense Refill  . amLODipine-valsartan (EXFORGE) 10-320 MG tablet Take 1 tablet by mouth daily. 90 tablet 3  . aspirin EC 81 MG tablet Take 81 mg by mouth daily.    . B-D UF III MINI PEN NEEDLES 31G X 5 MM MISC USE AS DIRECTED 100 each 3  . Cholecalciferol (VITAMIN D3) 1000 UNITS CAPS Take 1 capsule by mouth daily.      Marland Kitchen erythromycin ophthalmic ointment Place 1 application into the right eye 4 (four) times daily. 3.5 g 1  . fluticasone (FLONASE) 50 MCG/ACT nasal spray PLACE 2 SPRAYS INTO BOTH NOSTRILS DAILY. 18 g 5  . glucose blood (ONE TOUCH ULTRA TEST) test strip USE TO CHECK BLOOD SUGARS TWICE A DAY. DX E11.9 100 each 11  . glucose blood (ONE TOUCH ULTRA TEST) test strip USE TO CHECK BLOOD SUGARS TWICE A DAY. DX E11.9 100 each 3  . Insulin Glargine (BASAGLAR KWIKPEN) 100 UNIT/ML SOPN INJECT 100 UNITS INTO THE SKIN EVERY MORNING. 30 pen 0  . insulin lispro (HUMALOG KWIKPEN) 100 UNIT/ML KiwkPen Inject 20 units with supper. 15 mL 2  . Insulin Pen Needle (RELION PEN NEEDLE 31G/8MM) 31G X 8 MM MISC USE AS DIRECTED 3 TIMES A DAY 100 each 0  . ketorolac (TORADOL) 10 MG tablet Take 1 tablet (10 mg total) by mouth every 8 (eight) hours as needed for severe pain (renal colic). 20 tablet 0  . lovastatin (MEVACOR) 20 MG tablet Take 1 tablet (20 mg total) by mouth daily. 90 tablet 3  . morphine (MS CONTIN) 100 MG 12 hr tablet Take 2 tablets (200 mg total) by mouth 3 (three) times daily. 180 tablet 0  . morphine (MSIR) 15 MG tablet Take 1 tablet (15 mg total) by mouth 2 (two) times daily as needed for severe pain. 60 tablet 0  . ONETOUCH DELICA LANCETS 33G  MISC USE TO CHECK BLOOD SUGAR TWICE DAILY 100 each 3  . RELION PEN NEEDLE 31G/8MM 31G X 8 MM MISC USE AS DIRECTED THREE TIMES DAILY 100 each 11  . RELION SHORT PEN NEEDLES 31G X 8 MM MISC USE ONE NEEDLE TO INJECT INSULIN THREE TIMES DAILY 100 each 0  . umeclidinium-vilanterol (ANORO ELLIPTA) 62.5-25 MCG/INH AEPB Inhale 1 puff into the lungs daily. 3 each 3   No facility-administered medications prior to visit.     ROS: Review of Systems  Constitutional: Negative for appetite change, fatigue and unexpected weight change.  HENT: Negative for congestion, nosebleeds, sneezing, sore throat and trouble swallowing.   Eyes: Negative for itching and visual disturbance.  Respiratory: Negative for cough.   Cardiovascular: Negative for chest pain, palpitations and leg swelling.  Gastrointestinal: Negative for abdominal distention, blood in stool, diarrhea and nausea.  Genitourinary: Negative for frequency and hematuria.  Musculoskeletal: Positive for arthralgias and gait problem. Negative for back pain, joint swelling and neck pain.  Skin: Negative for rash.  Neurological: Negative for dizziness, tremors, speech difficulty and weakness.  Psychiatric/Behavioral: Negative for agitation, dysphoric mood and sleep disturbance. The patient is not nervous/anxious.     Objective:  BP 132/74 (BP Location: Left Arm, Patient Position: Sitting, Cuff Size: Large)   Pulse 89   Temp 98.4 F (36.9 C) (Oral)   Ht 5\' 10"  (1.778 m)   Wt 244 lb (110.7 kg)   SpO2 98%   BMI 35.01 kg/m   BP Readings from Last 3 Encounters:  06/21/18 132/74  04/23/18 (!) 144/72  04/07/18 (!) 155/86    Wt Readings from Last 3 Encounters:  06/21/18 244 lb (110.7 kg)  04/23/18 240 lb (108.9 kg)  04/07/18 245 lb (111.1 kg)    Physical Exam  Constitutional: He is oriented to person, place, and time. He appears well-developed. No distress.  NAD  HENT:  Mouth/Throat: Oropharynx is clear and moist.  Eyes: Pupils are equal,  round, and reactive to light. Conjunctivae are normal.  Neck: Normal range of motion. No JVD present. No thyromegaly present.  Cardiovascular: Normal rate, regular rhythm, normal heart sounds and intact distal pulses. Exam reveals no gallop and no friction rub.  No murmur heard. Pulmonary/Chest: Effort normal and breath sounds normal. No respiratory distress. He has no wheezes. He has no rales. He exhibits no tenderness.  Abdominal: Soft. Bowel sounds are normal. He exhibits no distension and no mass. There is no tenderness. There is no rebound and no guarding.  Musculoskeletal: Normal range of motion. He exhibits no edema or tenderness.  Lymphadenopathy:    He has no cervical adenopathy.  Neurological: He is alert and oriented to person, place, and time. He has normal reflexes. No cranial nerve deficit. He exhibits normal muscle tone. He displays a negative Romberg sign. Coordination and gait normal.  Skin: Skin is warm and dry. No rash noted.  Psychiatric: He has a normal mood and affect. His behavior is normal. Judgment and thought content normal.  L knee is tender w/ROM Trace edema B ankles   Lab Results  Component Value Date   WBC 10.3 04/23/2018   HGB 15.0 04/23/2018   HCT 44.0 04/23/2018   PLT 206.0 04/23/2018   GLUCOSE 150 (H) 04/23/2018   CHOL 127 04/23/2018   TRIG 86.0 04/23/2018   HDL 44.70 04/23/2018   LDLDIRECT 116.6 05/20/2007   LDLCALC 66 04/23/2018   ALT 33 04/23/2018   AST 24 04/23/2018   NA 135 04/23/2018   K 3.9 04/23/2018   CL 100 04/23/2018   CREATININE 0.83 04/23/2018   BUN 18 04/23/2018   CO2 30 04/23/2018   TSH 2.64 04/23/2018   PSA 0.20 04/23/2018   INR 1.1 05/10/2008   HGBA1C 9.3 (H) 04/26/2018   MICROALBUR 1.7 04/23/2018    No results found.  Assessment & Plan:   There are no diagnoses linked to this encounter.   No orders of the defined types were placed in this encounter.    Follow-up: No follow-ups on file.  Sonda Primes, MD

## 2018-06-21 NOTE — Assessment & Plan Note (Signed)
L knee pain x 1 mo - twisted 1 mo ago after getting off a ladder. Ice and advil did not help. Ortho ref - [facility]

## 2018-07-01 DIAGNOSIS — M25562 Pain in left knee: Secondary | ICD-10-CM | POA: Diagnosis not present

## 2018-07-01 DIAGNOSIS — M1712 Unilateral primary osteoarthritis, left knee: Secondary | ICD-10-CM | POA: Diagnosis not present

## 2018-07-01 DIAGNOSIS — M25511 Pain in right shoulder: Secondary | ICD-10-CM | POA: Diagnosis not present

## 2018-07-06 DIAGNOSIS — H5712 Ocular pain, left eye: Secondary | ICD-10-CM | POA: Diagnosis not present

## 2018-07-06 DIAGNOSIS — H16102 Unspecified superficial keratitis, left eye: Secondary | ICD-10-CM | POA: Diagnosis not present

## 2018-07-08 ENCOUNTER — Telehealth: Payer: Self-pay | Admitting: Internal Medicine

## 2018-07-08 DIAGNOSIS — H16102 Unspecified superficial keratitis, left eye: Secondary | ICD-10-CM | POA: Diagnosis not present

## 2018-07-08 NOTE — Telephone Encounter (Signed)
Referral has been sent and faxed to [phone]

## 2018-07-08 NOTE — Telephone Encounter (Signed)
Copied from CRM [id number]. Topic: General - Other >> Jul 08, 2018  8:27 AM Tamela Oddi wrote: [facility] with [facility] called to request a referral for patient to be seen at the eye care facility per [facility].  CB# [phone], ext. [phone]

## 2018-07-15 DIAGNOSIS — M25511 Pain in right shoulder: Secondary | ICD-10-CM | POA: Diagnosis not present

## 2018-07-15 DIAGNOSIS — M25562 Pain in left knee: Secondary | ICD-10-CM | POA: Diagnosis not present

## 2018-07-15 DIAGNOSIS — M1712 Unilateral primary osteoarthritis, left knee: Secondary | ICD-10-CM | POA: Diagnosis not present

## 2018-07-19 ENCOUNTER — Telehealth: Payer: Self-pay

## 2018-07-19 NOTE — Telephone Encounter (Signed)
[employer] denied cpap titration the first of October. I sent a letter of appeal to them because he was approved for PSG with his diagnosis codes. I checked with them at the end of October and had to send it again. I still have no answer from them. Do you want to order an auto so he does not have to keep waiting?

## 2018-07-21 ENCOUNTER — Other Ambulatory Visit: Payer: Self-pay | Admitting: Neurology

## 2018-07-21 DIAGNOSIS — G8929 Other chronic pain: Secondary | ICD-10-CM

## 2018-07-21 DIAGNOSIS — R351 Nocturia: Secondary | ICD-10-CM

## 2018-07-21 DIAGNOSIS — G894 Chronic pain syndrome: Secondary | ICD-10-CM

## 2018-07-21 DIAGNOSIS — G4701 Insomnia due to medical condition: Secondary | ICD-10-CM

## 2018-07-21 DIAGNOSIS — E1165 Type 2 diabetes mellitus with hyperglycemia: Secondary | ICD-10-CM

## 2018-07-21 DIAGNOSIS — G4719 Other hypersomnia: Secondary | ICD-10-CM

## 2018-07-21 DIAGNOSIS — J449 Chronic obstructive pulmonary disease, unspecified: Secondary | ICD-10-CM

## 2018-07-21 DIAGNOSIS — G4733 Obstructive sleep apnea (adult) (pediatric): Secondary | ICD-10-CM

## 2018-07-21 NOTE — Telephone Encounter (Signed)
Called the patient and his wife answered. I was able to review with the patients wife that we had been working with insurance trying to get approval for the patient to come in for  In lab CPAP titration study and insurance had not gotten back with us in this time frame despite re submitting. Advised the wife that since it was taking long Dr Vickey Hugerohmeier would like to go ahead and establish the pt with a auto CPAP machine to help with treatment of his apnea. Pt wife states that the pt did not think that his apnea was severe enough to require treatment and that the treatment was optional and the Dr didn't make it out to be like it was something he had to have. I informed her that I was the one that spoke with him and I always go over in detail the sleep study with the patient and I did inform that recommendation would be CPAP but we wanted to get him in to in lab titration to assess the correct pressure for treatment. His wife states that he was at work when we spoke and he may not have gave the call his attention. Patient states she will inform the husband of what we are recommending with moving forward to treat him and will have him contact our office to discuss further.

## 2018-07-27 ENCOUNTER — Encounter: Payer: Self-pay | Admitting: Internal Medicine

## 2018-07-27 ENCOUNTER — Ambulatory Visit: Payer: 59 | Admitting: Internal Medicine

## 2018-07-27 DIAGNOSIS — E1129 Type 2 diabetes mellitus with other diabetic kidney complication: Secondary | ICD-10-CM

## 2018-07-27 DIAGNOSIS — M79604 Pain in right leg: Secondary | ICD-10-CM

## 2018-07-27 DIAGNOSIS — Z6837 Body mass index (BMI) 37.0-37.9, adult: Secondary | ICD-10-CM

## 2018-07-27 DIAGNOSIS — M79605 Pain in left leg: Secondary | ICD-10-CM

## 2018-07-27 DIAGNOSIS — E1165 Type 2 diabetes mellitus with hyperglycemia: Secondary | ICD-10-CM | POA: Diagnosis not present

## 2018-07-27 DIAGNOSIS — M545 Low back pain: Secondary | ICD-10-CM

## 2018-07-27 DIAGNOSIS — E66812 Obesity, class 2: Secondary | ICD-10-CM

## 2018-07-27 DIAGNOSIS — I10 Essential (primary) hypertension: Secondary | ICD-10-CM | POA: Diagnosis not present

## 2018-07-27 DIAGNOSIS — G894 Chronic pain syndrome: Secondary | ICD-10-CM

## 2018-07-27 DIAGNOSIS — IMO0002 Reserved for concepts with insufficient information to code with codable children: Secondary | ICD-10-CM

## 2018-07-27 MED ORDER — MORPHINE SULFATE 15 MG PO TABS: 15.0000 mg | ORAL_TABLET | Freq: Two times a day (BID) | ORAL | 0 refills | Status: DC | PRN

## 2018-07-27 MED ORDER — MORPHINE SULFATE ER 100 MG PO TBCR: 200.0000 mg | EXTENDED_RELEASE_TABLET | Freq: Three times a day (TID) | ORAL | 0 refills | Status: DC

## 2018-07-27 MED ORDER — GLUCOSE BLOOD VI STRP: ORAL_STRIP | 11 refills | Status: DC

## 2018-07-27 NOTE — Assessment & Plan Note (Signed)
Wt Readings from Last 3 Encounters:  07/27/18 247 lb (112 kg)  06/21/18 244 lb (110.7 kg)  04/23/18 240 lb (108.9 kg)

## 2018-07-27 NOTE — Assessment & Plan Note (Signed)
On Bystolic, Amlod-Valsartan

## 2018-07-27 NOTE — Assessment & Plan Note (Signed)
On Lantus Poor compliance Risks associated with treatment noncompliance were discussed. Compliance was encouraged.

## 2018-07-27 NOTE — Assessment & Plan Note (Signed)
On Rx 

## 2018-07-27 NOTE — Assessment & Plan Note (Signed)
Better w/wt loss Humalog, Basaglar

## 2018-07-27 NOTE — Progress Notes (Signed)
Subjective:  Patient ID: [name] [name], male    DOB: [birth year]  Age: 52 y.o. MRN: [id number]  CC: No chief complaint on file.   HPI [name] [name] presents for DM, HTN, LBP f/u  Outpatient Medications Prior to Visit  Medication Sig Dispense Refill  . amLODipine-valsartan (EXFORGE) 10-320 MG tablet Take 1 tablet by mouth daily. 90 tablet 3  . aspirin EC 81 MG tablet Take 81 mg by mouth daily.    . B-D UF III MINI PEN NEEDLES 31G X 5 MM MISC USE AS DIRECTED 100 each 3  . Cholecalciferol (VITAMIN D3) 1000 UNITS CAPS Take 1 capsule by mouth daily.      Marland Kitchen. erythromycin ophthalmic ointment Place 1 application into the right eye 4 (four) times daily. 3.5 g 1  . fluticasone (FLONASE) 50 MCG/ACT nasal spray PLACE 2 SPRAYS INTO BOTH NOSTRILS DAILY. 18 g 5  . Insulin Glargine (BASAGLAR KWIKPEN) 100 UNIT/ML SOPN INJECT 100 UNITS INTO THE SKIN EVERY MORNING. 30 pen 0  . insulin lispro (HUMALOG KWIKPEN) 100 UNIT/ML KiwkPen Inject 20 units with supper. 15 mL 2  . Insulin Pen Needle (RELION PEN NEEDLE 31G/8MM) 31G X 8 MM MISC USE AS DIRECTED 3 TIMES A DAY 100 each 0  . ketorolac (TORADOL) 10 MG tablet Take 1 tablet (10 mg total) by mouth every 8 (eight) hours as needed for severe pain (renal colic). 20 tablet 0  . lovastatin (MEVACOR) 20 MG tablet Take 1 tablet (20 mg total) by mouth daily. 90 tablet 3  . morphine (MS CONTIN) 100 MG 12 hr tablet Take 2 tablets (200 mg total) by mouth 3 (three) times daily. 180 tablet 0  . morphine (MSIR) 15 MG tablet Take 1 tablet (15 mg total) by mouth 2 (two) times daily as needed for severe pain. 60 tablet 0  . ONETOUCH DELICA LANCETS 33G MISC USE TO CHECK BLOOD SUGAR TWICE DAILY 100 each 3  . RELION PEN NEEDLE 31G/8MM 31G X 8 MM MISC USE AS DIRECTED THREE TIMES DAILY 100 each 11  . RELION SHORT PEN NEEDLES 31G X 8 MM MISC USE ONE NEEDLE TO INJECT INSULIN THREE TIMES DAILY 100 each 0  . umeclidinium-vilanterol (ANORO ELLIPTA) 62.5-25 MCG/INH AEPB Inhale 1 puff  into the lungs daily. 3 each 3  . glucose blood (ONE TOUCH ULTRA TEST) test strip USE TO CHECK BLOOD SUGARS TWICE A DAY. DX E11.9 100 each 11  . glucose blood (ONE TOUCH ULTRA TEST) test strip USE TO CHECK BLOOD SUGARS TWICE A DAY. DX E11.9 100 each 3   No facility-administered medications prior to visit.     ROS: Review of Systems  Constitutional: Negative for appetite change, fatigue and unexpected weight change.  HENT: Negative for congestion, nosebleeds, sneezing, sore throat and trouble swallowing.   Eyes: Negative for itching and visual disturbance.  Respiratory: Negative for cough.   Cardiovascular: Negative for chest pain, palpitations and leg swelling.  Gastrointestinal: Negative for abdominal distention, blood in stool, diarrhea and nausea.  Genitourinary: Negative for frequency and hematuria.  Musculoskeletal: Positive for arthralgias, back pain and gait problem. Negative for joint swelling and neck pain.  Skin: Negative for rash.  Neurological: Negative for dizziness, tremors, speech difficulty and weakness.  Psychiatric/Behavioral: Negative for agitation, dysphoric mood and sleep disturbance. The patient is not nervous/anxious.     Objective:  BP 130/74 (BP Location: Left Arm, Patient Position: Sitting, Cuff Size: Normal)   Pulse 78   Temp 98.3 F (36.8 C) (  Oral)   Ht 5\' 10"  (1.778 m)   Wt 247 lb (112 kg)   SpO2 96%   BMI 35.44 kg/m   BP Readings from Last 3 Encounters:  07/27/18 130/74  06/21/18 132/74  04/23/18 (!) 144/72    Wt Readings from Last 3 Encounters:  07/27/18 247 lb (112 kg)  06/21/18 244 lb (110.7 kg)  04/23/18 240 lb (108.9 kg)    Physical Exam  Constitutional: He is oriented to person, place, and time. He appears well-developed. No distress.  NAD  HENT:  Mouth/Throat: Oropharynx is clear and moist.  Eyes: Pupils are equal, round, and reactive to light. Conjunctivae are normal.  Neck: Normal range of motion. No JVD present. No thyromegaly  present.  Cardiovascular: Normal rate, regular rhythm, normal heart sounds and intact distal pulses. Exam reveals no gallop and no friction rub.  No murmur heard. Pulmonary/Chest: Effort normal and breath sounds normal. No respiratory distress. He has no wheezes. He has no rales. He exhibits no tenderness.  Abdominal: Soft. Bowel sounds are normal. He exhibits no distension and no mass. There is no tenderness. There is no rebound and no guarding.  Musculoskeletal: Normal range of motion. He exhibits tenderness. He exhibits no edema.  Lymphadenopathy:    He has no cervical adenopathy.  Neurological: He is alert and oriented to person, place, and time. He has normal reflexes. No cranial nerve deficit. He exhibits normal muscle tone. He displays a negative Romberg sign. Coordination and gait normal.  Skin: Skin is warm and dry. No rash noted.  Psychiatric: He has a normal mood and affect. His behavior is normal. Judgment and thought content normal.  LS is tender  Lab Results  Component Value Date   WBC 10.3 04/23/2018   HGB 15.0 04/23/2018   HCT 44.0 04/23/2018   PLT 206.0 04/23/2018   GLUCOSE 150 (H) 04/23/2018   CHOL 127 04/23/2018   TRIG 86.0 04/23/2018   HDL 44.70 04/23/2018   LDLDIRECT 116.6 05/20/2007   LDLCALC 66 04/23/2018   ALT 33 04/23/2018   AST 24 04/23/2018   NA 135 04/23/2018   K 3.9 04/23/2018   CL 100 04/23/2018   CREATININE 0.83 04/23/2018   BUN 18 04/23/2018   CO2 30 04/23/2018   TSH 2.64 04/23/2018   PSA 0.20 04/23/2018   INR 1.1 05/10/2008   HGBA1C 9.3 (H) 04/26/2018   MICROALBUR 1.7 04/23/2018    No results found.  Assessment & Plan:   There are no diagnoses linked to this encounter.   Meds ordered this encounter  Medications  . glucose blood (ONE TOUCH ULTRA TEST) test strip    Sig: USE TO CHECK BLOOD SUGARS TWICE A DAY. DX E11.9    Dispense:  200 each    Refill:  11     Follow-up: No follow-ups on file.  Sonda Primes, MD

## 2018-07-27 NOTE — Assessment & Plan Note (Signed)
On MSIR, MSContin   Potential benefits of a long term opioids  use as well as potential risks  and complications were explained to the patient and were aknowledged. 

## 2018-10-26 ENCOUNTER — Other Ambulatory Visit: Payer: Self-pay | Admitting: Endocrinology

## 2018-10-28 ENCOUNTER — Ambulatory Visit: Payer: No Typology Code available for payment source | Admitting: Internal Medicine

## 2018-10-28 ENCOUNTER — Encounter: Payer: Self-pay | Admitting: Internal Medicine

## 2018-10-28 ENCOUNTER — Other Ambulatory Visit (INDEPENDENT_AMBULATORY_CARE_PROVIDER_SITE_OTHER): Payer: No Typology Code available for payment source

## 2018-10-28 DIAGNOSIS — E1129 Type 2 diabetes mellitus with other diabetic kidney complication: Secondary | ICD-10-CM | POA: Diagnosis not present

## 2018-10-28 DIAGNOSIS — E1165 Type 2 diabetes mellitus with hyperglycemia: Secondary | ICD-10-CM

## 2018-10-28 DIAGNOSIS — IMO0002 Reserved for concepts with insufficient information to code with codable children: Secondary | ICD-10-CM

## 2018-10-28 DIAGNOSIS — G894 Chronic pain syndrome: Secondary | ICD-10-CM

## 2018-10-28 DIAGNOSIS — I10 Essential (primary) hypertension: Secondary | ICD-10-CM

## 2018-10-28 DIAGNOSIS — J449 Chronic obstructive pulmonary disease, unspecified: Secondary | ICD-10-CM

## 2018-10-28 LAB — BASIC METABOLIC PANEL
BUN: 11 mg/dL (ref 6–23)
CALCIUM: 9.1 mg/dL (ref 8.4–10.5)
CO2: 29 meq/L (ref 19–32)
CREATININE: 0.79 mg/dL (ref 0.40–1.50)
Chloride: 97 mEq/L (ref 96–112)
GFR: 102.1 mL/min (ref >60.00)
Glucose, Bld: 231 mg/dL — ABNORMAL HIGH (ref 70–99)
Potassium: 4 mEq/L (ref 3.5–5.1)
Sodium: 133 mEq/L — ABNORMAL LOW (ref 135–145)

## 2018-10-28 LAB — HEMOGLOBIN A1C: Hgb A1c MFr Bld: 10.2 % — ABNORMAL HIGH (ref 4.6–6.5)

## 2018-10-28 MED ORDER — MORPHINE SULFATE ER 100 MG PO TBCR: 200.0000 mg | EXTENDED_RELEASE_TABLET | Freq: Three times a day (TID) | ORAL | 0 refills | Status: DC

## 2018-10-28 MED ORDER — MORPHINE SULFATE 15 MG PO TABS: 15.0000 mg | ORAL_TABLET | Freq: Two times a day (BID) | ORAL | 0 refills | Status: DC | PRN

## 2018-10-28 NOTE — Assessment & Plan Note (Signed)
BP Readings from Last 3 Encounters:  10/28/18 132/78  07/27/18 130/74  06/21/18 132/74

## 2018-10-28 NOTE — Progress Notes (Signed)
Subjective:  Patient ID: [name] [name], male    DOB: [birth year]  Age: 53 y.o. MRN: [id number]  CC: No chief complaint on file.   HPI DEMARQUEZ HAMLYN presents for for chronic pain, DM, CAD f/u  Outpatient Medications Prior to Visit  Medication Sig Dispense Refill  . amLODipine-valsartan (EXFORGE) 10-320 MG tablet Take 1 tablet by mouth daily. 90 tablet 3  . aspirin EC 81 MG tablet Take 81 mg by mouth daily.    . B-D UF III MINI PEN NEEDLES 31G X 5 MM MISC USE AS DIRECTED 100 each 3  . Cholecalciferol (VITAMIN D3) 1000 UNITS CAPS Take 1 capsule by mouth daily.      Marland Kitchen erythromycin ophthalmic ointment Place 1 application into the right eye 4 (four) times daily. 3.5 g 1  . fluticasone (FLONASE) 50 MCG/ACT nasal spray PLACE 2 SPRAYS INTO BOTH NOSTRILS DAILY. 18 g 5  . glucose blood (ONE TOUCH ULTRA TEST) test strip USE TO CHECK BLOOD SUGARS TWICE A DAY. DX E11.9 200 each 11  . Insulin Glargine (BASAGLAR KWIKPEN) 100 UNIT/ML SOPN INJECT 100 UNITS INTO THE SKIN EVERY MORNING. 30 pen 0  . insulin lispro (HUMALOG KWIKPEN) 100 UNIT/ML KiwkPen Inject 20 units with supper. 15 mL 2  . Insulin Pen Needle (RELION PEN NEEDLE 31G/8MM) 31G X 8 MM MISC USE AS DIRECTED 3 TIMES A DAY 100 each 0  . ketorolac (TORADOL) 10 MG tablet Take 1 tablet (10 mg total) by mouth every 8 (eight) hours as needed for severe pain (renal colic). 20 tablet 0  . lovastatin (MEVACOR) 20 MG tablet Take 1 tablet (20 mg total) by mouth daily. 90 tablet 3  . morphine (MS CONTIN) 100 MG 12 hr tablet Take 2 tablets (200 mg total) by mouth 3 (three) times daily. 180 tablet 0  . morphine (MSIR) 15 MG tablet Take 1 tablet (15 mg total) by mouth 2 (two) times daily as needed for severe pain. 60 tablet 0  . ONETOUCH DELICA LANCETS 33G MISC USE TO CHECK BLOOD SUGAR TWICE DAILY 100 each 3  . RELION PEN NEEDLE 31G/8MM 31G X 8 MM MISC USE AS DIRECTED THREE TIMES DAILY 100 each 11  . RELION SHORT PEN NEEDLES 31G X 8 MM MISC USE ONE NEEDLE  TO INJECT INSULIN THREE TIMES DAILY 100 each 0  . umeclidinium-vilanterol (ANORO ELLIPTA) 62.5-25 MCG/INH AEPB Inhale 1 puff into the lungs daily. 3 each 3   No facility-administered medications prior to visit.     ROS: Review of Systems  Constitutional: Negative for appetite change, fatigue and unexpected weight change.  HENT: Negative for congestion, nosebleeds, sneezing, sore throat and trouble swallowing.   Eyes: Negative for itching and visual disturbance.  Respiratory: Negative for cough.   Cardiovascular: Negative for chest pain, palpitations and leg swelling.  Gastrointestinal: Negative for abdominal distention, blood in stool, diarrhea and nausea.  Genitourinary: Negative for frequency and hematuria.  Musculoskeletal: Positive for back pain. Negative for gait problem, joint swelling and neck pain.  Skin: Negative for rash.  Neurological: Negative for dizziness, tremors, speech difficulty and weakness.  Psychiatric/Behavioral: Negative for agitation, dysphoric mood and sleep disturbance. The patient is not nervous/anxious.     Objective:  BP 132/78 (BP Location: Left Arm, Patient Position: Sitting, Cuff Size: Large)   Pulse 75   Temp 97.8 F (36.6 C) (Oral)   Ht 5\' 10"  (1.778 m)   Wt 241 lb (109.3 kg)   SpO2 98%   BMI 34.58  kg/m   BP Readings from Last 3 Encounters:  10/28/18 132/78  07/27/18 130/74  06/21/18 132/74    Wt Readings from Last 3 Encounters:  10/28/18 241 lb (109.3 kg)  07/27/18 247 lb (112 kg)  06/21/18 244 lb (110.7 kg)    Physical Exam Constitutional:      General: He is not in acute distress.    Appearance: He is well-developed.     Comments: NAD  Eyes:     Conjunctiva/sclera: Conjunctivae normal.     Pupils: Pupils are equal, round, and reactive to light.  Neck:     Musculoskeletal: Normal range of motion.     Thyroid: No thyromegaly.     Vascular: No JVD.  Cardiovascular:     Rate and Rhythm: Normal rate and regular rhythm.      Heart sounds: Normal heart sounds. No murmur. No friction rub. No gallop.   Pulmonary:     Effort: Pulmonary effort is normal. No respiratory distress.     Breath sounds: Normal breath sounds. No wheezing or rales.  Chest:     Chest wall: No tenderness.  Abdominal:     General: Bowel sounds are normal. There is no distension.     Palpations: Abdomen is soft. There is no mass.     Tenderness: There is no abdominal tenderness. There is no guarding or rebound.  Musculoskeletal: Normal range of motion.        General: Tenderness present.  Lymphadenopathy:     Cervical: No cervical adenopathy.  Skin:    General: Skin is warm and dry.     Findings: No rash.  Neurological:     Mental Status: He is alert and oriented to person, place, and time.     Cranial Nerves: No cranial nerve deficit.     Motor: No abnormal muscle tone.     Coordination: Coordination normal.     Gait: Gait normal.     Deep Tendon Reflexes: Reflexes are normal and symmetric.  Psychiatric:        Behavior: Behavior normal.        Thought Content: Thought content normal.        Judgment: Judgment normal.   LS spine w/pain  Lab Results  Component Value Date   WBC 10.3 04/23/2018   HGB 15.0 04/23/2018   HCT 44.0 04/23/2018   PLT 206.0 04/23/2018   GLUCOSE 150 (H) 04/23/2018   CHOL 127 04/23/2018   TRIG 86.0 04/23/2018   HDL 44.70 04/23/2018   LDLDIRECT 116.6 05/20/2007   LDLCALC 66 04/23/2018   ALT 33 04/23/2018   AST 24 04/23/2018   NA 135 04/23/2018   K 3.9 04/23/2018   CL 100 04/23/2018   CREATININE 0.83 04/23/2018   BUN 18 04/23/2018   CO2 30 04/23/2018   TSH 2.64 04/23/2018   PSA 0.20 04/23/2018   INR 1.1 05/10/2008   HGBA1C 9.3 (H) 04/26/2018   MICROALBUR 1.7 04/23/2018    No results found.  Assessment & Plan:   There are no diagnoses linked to this encounter.   No orders of the defined types were placed in this encounter.    Follow-up: No follow-ups on file.  Sonda Primes, MD

## 2018-10-28 NOTE — Assessment & Plan Note (Signed)
2 ppd smoker - discussed 

## 2018-10-28 NOTE — Assessment & Plan Note (Signed)
MS contin MSIR  Potential benefits of a long term opioids use as well as potential risks (i.e. addiction risk, apnea etc) and complications (i.e. Somnolence, constipation and others) were explained to the patient and were aknowledged.

## 2018-10-28 NOTE — Assessment & Plan Note (Signed)
F/u w/Dr Everardo All On Lantus Better compliance

## 2018-10-30 ENCOUNTER — Other Ambulatory Visit: Payer: Self-pay | Admitting: Internal Medicine

## 2018-11-22 ENCOUNTER — Other Ambulatory Visit: Payer: Self-pay | Admitting: Endocrinology

## 2018-11-22 NOTE — Telephone Encounter (Signed)
Pt last seen 10/2016. Please advise

## 2018-11-22 NOTE — Telephone Encounter (Signed)
Please refill x 1 Ov is due  

## 2018-12-06 ENCOUNTER — Other Ambulatory Visit: Payer: Self-pay | Admitting: Internal Medicine

## 2018-12-15 ENCOUNTER — Other Ambulatory Visit: Payer: Self-pay | Admitting: Endocrinology

## 2019-01-01 ENCOUNTER — Other Ambulatory Visit: Payer: Self-pay | Admitting: Endocrinology

## 2019-01-01 NOTE — Telephone Encounter (Signed)
Please refill x 1 F/u is due  

## 2019-01-03 ENCOUNTER — Telehealth: Payer: Self-pay

## 2019-01-03 ENCOUNTER — Other Ambulatory Visit: Payer: Self-pay | Admitting: Endocrinology

## 2019-01-03 NOTE — Telephone Encounter (Signed)
Unable to process refill request without an appt. Called home # and male voice advised to call mobile #. Called mobile # but did not answer nor had VM.

## 2019-01-25 ENCOUNTER — Ambulatory Visit: Payer: Self-pay | Admitting: *Deleted

## 2019-01-25 NOTE — Telephone Encounter (Signed)
Pt's wife calling stating that the pt was told by his boss on Sunday that he was around someone who tested positive for COVID-19. 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 Pt does not have any symptoms currently. Advised pt's wife that home isolation was needed and the pt should not return to work at this time. Pt's wife states that her and the pt have in office appts scheduled for tomorrow. Pt can be contacted at [phone] with recommendations.  Reason for Disposition . [1] COVID-19 EXPOSURE (Close Contact) within last 14 days AND [2] NO cough, fever, or breathing difficulty  Answer Assessment - Initial Assessment Questions 1. CLOSE CONTACT: "Who is the person with the confirmed or suspected COVID-19 infection that you were exposed to?"     Employee at work 2. PLACE of CONTACT: "Where were you when you were exposed to COVID-19?" (e.g., home, school, medical waiting room; which city?)     At work 3. TYPE of CONTACT: "How much contact was there?" (e.g., sitting next to, live in same house, work in same office, same building)     Worked on the same plumbing project together 4. DURATION of CONTACT: "How long were you in contact with the COVID-19 patient?" (e.g., a few seconds, passed by person, a few minutes, live with the patient)     Around the person a lot on Friday 5. DATE of CONTACT: "When did you have contact with a COVID-19 patient?" (e.g., how many days ago)     Friday 6. TRAVEL: "Have you traveled out of the country recently?" If so, "When and where?"     * Also ask about out-of-state travel, since the CDC has identified some high risk cities for community spread in the [place].     * Note: Travel becomes less relevant if there is widespread community transmission where the patient lives.     n/a 7. COMMUNITY SPREAD: "Are there lots of cases of COVID-19 (community spread)  where you live?" (See public health department website, if unsure)   * MAJOR community spread: high number of cases; numbers of cases are increasing; many people hospitalized.   * MINOR community spread: low number of cases; not increasing; few or no people hospitalized     yes 8. SYMPTOMS: "Do you have any symptoms?" (e.g., fever, cough, breathing difficulty)     No 9. PREGNANCY OR POSTPARTUM: "Is there any chance you are pregnant?" "When was your last menstrual period?" "Did you deliver in the last 2 weeks?"     n/a 10. HIGH RISK: "Do you have any heart or lung problems? Do you have a weak immune system?" (e.g., CHF, COPD, asthma, HIV positive, chemotherapy, renal failure, diabetes mellitus, sickle cell anemia)       Diabetic, HTN  Protocols used: CORONAVIRUS (COVID-19) EXPOSURE-A-AH

## 2019-01-25 NOTE — Telephone Encounter (Signed)
Visit converted to telephone visit for pt and wife

## 2019-01-26 ENCOUNTER — Encounter: Payer: Self-pay | Admitting: Internal Medicine

## 2019-01-26 ENCOUNTER — Ambulatory Visit (INDEPENDENT_AMBULATORY_CARE_PROVIDER_SITE_OTHER): Payer: No Typology Code available for payment source | Admitting: Internal Medicine

## 2019-01-26 ENCOUNTER — Other Ambulatory Visit: Payer: Self-pay | Admitting: Endocrinology

## 2019-01-26 DIAGNOSIS — M545 Low back pain: Secondary | ICD-10-CM | POA: Diagnosis not present

## 2019-01-26 DIAGNOSIS — E785 Hyperlipidemia, unspecified: Secondary | ICD-10-CM | POA: Diagnosis not present

## 2019-01-26 DIAGNOSIS — I10 Essential (primary) hypertension: Secondary | ICD-10-CM | POA: Diagnosis not present

## 2019-01-26 DIAGNOSIS — Z20828 Contact with and (suspected) exposure to other viral communicable diseases: Secondary | ICD-10-CM

## 2019-01-26 DIAGNOSIS — E1129 Type 2 diabetes mellitus with other diabetic kidney complication: Secondary | ICD-10-CM | POA: Diagnosis not present

## 2019-01-26 DIAGNOSIS — Z20822 Contact with and (suspected) exposure to covid-19: Secondary | ICD-10-CM

## 2019-01-26 DIAGNOSIS — E1165 Type 2 diabetes mellitus with hyperglycemia: Secondary | ICD-10-CM

## 2019-01-26 DIAGNOSIS — M79604 Pain in right leg: Secondary | ICD-10-CM

## 2019-01-26 DIAGNOSIS — IMO0002 Reserved for concepts with insufficient information to code with codable children: Secondary | ICD-10-CM

## 2019-01-26 DIAGNOSIS — M79605 Pain in left leg: Secondary | ICD-10-CM

## 2019-01-26 MED ORDER — MORPHINE SULFATE ER 100 MG PO TBCR: 200.0000 mg | EXTENDED_RELEASE_TABLET | Freq: Three times a day (TID) | ORAL | 0 refills | Status: DC

## 2019-01-26 MED ORDER — MORPHINE SULFATE 15 MG PO TABS: 15.0000 mg | ORAL_TABLET | Freq: Two times a day (BID) | ORAL | 0 refills | Status: DC | PRN

## 2019-01-26 NOTE — Assessment & Plan Note (Signed)
Chronic severe LBP, on opioids On MSIR, MSContin   Potential benefits of a long term opioids  use as well as potential risks  and complications were explained to the patient and were aknowledged. 

## 2019-01-26 NOTE — Assessment & Plan Note (Signed)
Improve compliance with diet and Lantus

## 2019-01-26 NOTE — Progress Notes (Signed)
Virtual Visit via Telephone Note  I connected with [name] [name] on 01/26/19 at  3:40 PM EDT by telephone and verified that I am speaking with the correct person using two identifiers.   I discussed the limitations, risks, security and privacy concerns of performing an evaluation and management service by telephone and the availability of in person appointments. I also discussed with the patient that there may be a patient responsible charge related to this service. The patient expressed understanding and agreed to proceed.   History of Present Illness: We need to follow-up on chronic pain, diabetes, dyslipidemia. Last week by [employer] was exposed to a coworker with COVID-19.  He was informed of this exposure on Sunday.  He has been off work at home - they want him to go back to work now.  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  It is just 2 of them.  There is no COVID symptoms at present.   There is no chest pain, cough, fever, diarrhea, nausea.  There is low back pain present Observations/Objective:  Biopsy sounds normal on the phone Assessment and Plan:  See plan Follow Up Instructions:    I discussed the assessment and treatment plan with the patient. The patient was provided an opportunity to ask questions and all were answered. The patient agreed with the plan and demonstrated an understanding of the instructions.   The patient was advised to call back or seek an in-person evaluation if the symptoms worsen or if the condition fails to improve as anticipated.  I provided 21 minutes of non-face-to-face time during this encounter.   Sonda Primes, MD

## 2019-01-26 NOTE — Assessment & Plan Note (Signed)
Continue with diastolic, amlodipine valsartan

## 2019-01-26 NOTE — Assessment & Plan Note (Signed)
Continue with lovastatin 

## 2019-01-26 NOTE — Assessment & Plan Note (Signed)
Quarantine for 2 weeks He can get tested at the health department Call if symptoms He can work together with his coworker left was exposed with body assuming they both have no symptoms and no other contacts

## 2019-02-04 ENCOUNTER — Telehealth: Payer: Self-pay | Admitting: Internal Medicine

## 2019-02-04 NOTE — Telephone Encounter (Signed)
Informed Betty. She will follow up.

## 2019-02-04 NOTE — Telephone Encounter (Signed)
Copied from CRM [id number]. Topic: Quick Communication - Rx Refill/Question >> Feb 04, 2019  9:15 AM Randol Kern wrote: Medication: morphine (MSIR) 15 MG tablet [[id number]] - Pt's wife called to report that pharmacy states they dont have June's prescription, just two scripts for July. Our records say otherwise. Please advise  Has the patient contacted their pharmacy? Yes  (Agent: If no, request that the patient contact the pharmacy for the refill.) (Agent: If yes, when and what did the pharmacy advise?)  Preferred Pharmacy (with phone number or street name): [id number] - [place],  - [address] [place] [address] Phone: [phone] Fax: [phone]    Agent: Please be advised that RX refills may take up to 3 business days. We ask that you follow-up with your pharmacy.

## 2019-02-07 ENCOUNTER — Other Ambulatory Visit: Payer: Self-pay

## 2019-02-08 NOTE — Telephone Encounter (Signed)
This was taken care of Friday.

## 2019-02-09 ENCOUNTER — Ambulatory Visit (INDEPENDENT_AMBULATORY_CARE_PROVIDER_SITE_OTHER): Payer: No Typology Code available for payment source | Admitting: Endocrinology

## 2019-02-09 ENCOUNTER — Other Ambulatory Visit: Payer: Self-pay

## 2019-02-09 DIAGNOSIS — E11649 Type 2 diabetes mellitus with hypoglycemia without coma: Secondary | ICD-10-CM

## 2019-02-09 DIAGNOSIS — Z794 Long term (current) use of insulin: Secondary | ICD-10-CM | POA: Diagnosis not present

## 2019-02-09 DIAGNOSIS — E1165 Type 2 diabetes mellitus with hyperglycemia: Secondary | ICD-10-CM

## 2019-02-09 NOTE — Progress Notes (Signed)
Subjective:    Patient ID: [name] [name] [name] [name], male    DOB: 04-11-1966, 53 y.o.   MRN: [id number]  HPI telehealth visit today via phone x 8 minutes.  Alternatives to telehealth are presented to this patient, and the patient agrees to the telehealth visit. Pt is advised of the cost of the visit, and agrees to this, also.   Patient is at home, and I am at the office.   Persons attending the telehealth visit: the patient and I.   Pt returns for f/u of diabetes mellitus:  DM type: Insulin-requiring type 2 Dx'ed: 2005 Complications: none Therapy: insulin since 2007 DKA: never Severe hypoglycemia: never.  Pancreatitis: never.   Other: he takes 2 QD insulins, due to h/o noncompliance; he works M-F, as a Nutritional therapistplumber.   Interval history: He has frequent mild hypoglycemia in the mid-morning.  He says cbg varies from 55-300.  He says he takes just 50 units of basaglar if cbg is below 200.  He say breakfast is small.  It is highest at HS, and fasting.  He has not recently taken the humalog, as he says it causes hypoglycemia.  He says he never misses the basaglar.   Past Medical History:  Diagnosis Date  . COPD (chronic obstructive pulmonary disease) (HCC)   . Diabetes mellitus   . GERD (gastroesophageal reflux disease)   . Hyperlipidemia   . Hypertension   . Lower back pain   . Nephrolithiasis    Dr Annabell HowellsWrenn    Past Surgical History:  Procedure Laterality Date  . KNEE SURGERY      Social History   Socioeconomic History  . Marital status: Married    Spouse name: Not on file  . Number of children: 2  . Years of education: Not on file  . Highest education level: Not on file  Occupational History  . Not on file  Social Needs  . Financial resource strain: Not on file  . Food insecurity    Worry: Not on file    Inability: Not on file  . Transportation needs    Medical: Not on file    Non-medical: Not on file  Tobacco Use  . Smoking status: Current Every Day Smoker    Packs/day: 2.00     Types: Cigarettes  . Smokeless tobacco: Never Used  Substance and Sexual Activity  . Alcohol use: No  . Drug use: No  . Sexual activity: Yes  Lifestyle  . Physical activity    Days per week: Not on file    Minutes per session: Not on file  . Stress: Not on file  Relationships  . Social Musicianconnections    Talks on phone: Not on file    Gets together: Not on file    Attends religious service: Not on file    Active member of club or organization: Not on file    Attends meetings of clubs or organizations: Not on file    Relationship status: Not on file  . Intimate partner violence    Fear of current or ex partner: Not on file    Emotionally abused: Not on file    Physically abused: Not on file    Forced sexual activity: Not on file  Other Topics Concern  . Not on file  Social History Narrative   No regular exercise    Current Outpatient Medications on File Prior to Visit  Medication Sig Dispense Refill  . amLODipine-valsartan (EXFORGE) 10-320 MG tablet TAKE 1 TABLET BY  MOUTH DAILY. 90 tablet 3  . aspirin EC 81 MG tablet Take 81 mg by mouth daily.    . B-[name] UF III MINI PEN NEEDLES 31G X 5 MM MISC USE AS DIRECTED 100 each 3  . Cholecalciferol (VITAMIN D3) 1000 UNITS CAPS Take 1 capsule by mouth daily.      Marland Kitchen. erythromycin ophthalmic ointment Place 1 application into the right eye 4 (four) times daily. 3.5 g 1  . fluticasone (FLONASE) 50 MCG/ACT nasal spray PLACE 2 SPRAYS INTO BOTH NOSTRILS DAILY. 18 g 5  . glucose blood (ONE TOUCH ULTRA TEST) test strip USE TO CHECK BLOOD SUGARS TWICE A DAY. DX E11.9 200 each 11  . Insulin Glargine (BASAGLAR KWIKPEN) 100 UNIT/ML SOPN INJECT 100 UNITS INTO THE SKIN EVERY MORNING. 3 mL 0  . insulin lispro (HUMALOG KWIKPEN) 100 UNIT/ML KiwkPen Inject 20 units with supper. 15 mL 2  . Insulin Pen Needle (RELION PEN NEEDLE 31G/8MM) 31G X 8 MM MISC USE AS DIRECTED 3 TIMES A DAY 100 each 0  . ketorolac (TORADOL) 10 MG tablet Take 1 tablet (10 mg total) by  mouth every 8 (eight) hours as needed for severe pain (renal colic). 20 tablet 0  . lovastatin (MEVACOR) 20 MG tablet Take 1 tablet (20 mg total) by mouth daily. 90 tablet 3  . morphine (MS CONTIN) 100 MG 12 hr tablet Take 2 tablets (200 mg total) by mouth 3 (three) times daily. 180 tablet 0  . morphine (MS CONTIN) 100 MG 12 hr tablet Take 2 tablets (200 mg total) by mouth 3 (three) times daily. 180 tablet 0  . morphine (MSIR) 15 MG tablet Take 1 tablet (15 mg total) by mouth 2 (two) times daily as needed for severe pain. 60 tablet 0  . morphine (MSIR) 15 MG tablet Take 1 tablet (15 mg total) by mouth 2 (two) times daily as needed for severe pain. 60 tablet 0  . ONETOUCH DELICA LANCETS 33G MISC USE TO CHECK BLOOD SUGAR TWICE DAILY 100 each 3  . RELION PEN NEEDLE 31G/8MM 31G X 8 MM MISC USE AS DIRECTED THREE TIMES DAILY 100 each 11  . RELION SHORT PEN NEEDLES 31G X 8 MM MISC USE ONE NEEDLE TO INJECT INSULIN THREE TIMES DAILY 100 each 0  . umeclidinium-vilanterol (ANORO ELLIPTA) 62.5-25 MCG/INH AEPB Inhale 1 puff into the lungs daily. 3 each 3   No current facility-administered medications on file prior to visit.     Allergies  Allergen Reactions  . Belviq [Lorcaserin Hcl]     wierd  . Benazepril Hcl     REACTION: cough  . Nsaids     Can take short term Upset stomach    Family History  Problem Relation Age of Onset  . Hypertension Mother   . Diabetes Mother   . CAD Mother   . Heart disease Other        CAD male relative <60  . Vision loss Sister      Review of Systems Denies LOC.      Objective:   Physical Exam   Lab Results  Component Value Date   CREATININE 0.79 10/28/2018   BUN 11 10/28/2018   NA 133 (L) 10/28/2018   K 4.0 10/28/2018   CL 97 10/28/2018   CO2 29 10/28/2018       Assessment & Plan:  Insulin-requiring type 2 DM: uncertain glycemic control.  Hypoglycemia: This limits aggressiveness of glycemic control.    Patient Instructions  check your  blood sugar twice a day.  vary the time of day when you check, between before the 3 meals, and at bedtime.  also check if you have symptoms of your blood sugar being too high or too low.  please keep a record of the readings and bring it to your next appointment here.  You can write it on any piece of paper.  please call us sooner if your blood sugar goes below 70, or if you have a lot of readings over 200. Please come in to have the A1c checked. Based on the results, we'll change basaglar to [facility].  Please come back for a follow-up appointment in 2 months.

## 2019-02-09 NOTE — Patient Instructions (Addendum)
check your blood sugar twice a day.  vary the time of day when you check, between before the 3 meals, and at bedtime.  also check if you have symptoms of your blood sugar being too high or too low.  please keep a record of the readings and bring it to your next appointment here.  You can write it on any piece of paper.  please call us sooner if your blood sugar goes below 70, or if you have a lot of readings over 200. Please come in to have the A1c checked. Based on the results, we'll change basaglar to [facility].  Please come back for a follow-up appointment in 2 months.

## 2019-03-03 ENCOUNTER — Other Ambulatory Visit: Payer: Self-pay | Admitting: Internal Medicine

## 2019-03-07 ENCOUNTER — Ambulatory Visit: Payer: No Typology Code available for payment source

## 2019-03-07 ENCOUNTER — Other Ambulatory Visit: Payer: Self-pay

## 2019-03-11 ENCOUNTER — Other Ambulatory Visit: Payer: Self-pay

## 2019-03-11 ENCOUNTER — Ambulatory Visit (INDEPENDENT_AMBULATORY_CARE_PROVIDER_SITE_OTHER): Payer: No Typology Code available for payment source

## 2019-03-11 DIAGNOSIS — E1165 Type 2 diabetes mellitus with hyperglycemia: Secondary | ICD-10-CM | POA: Diagnosis not present

## 2019-03-11 LAB — POCT GLYCOSYLATED HEMOGLOBIN (HGB A1C): Hemoglobin A1C: 10.6 % — AB (ref 4.0–5.6)

## 2019-03-11 MED ORDER — TOUJEO SOLOSTAR 300 UNIT/ML ~~LOC~~ SOPN: 100.0000 [IU] | PEN_INJECTOR | Freq: Every day | SUBCUTANEOUS | 2 refills | Status: DC

## 2019-03-11 NOTE — Progress Notes (Signed)
Pt presents today for nurse visit for completion of A1C. 

## 2019-04-05 ENCOUNTER — Other Ambulatory Visit: Payer: Self-pay | Admitting: Internal Medicine

## 2019-04-06 ENCOUNTER — Other Ambulatory Visit: Payer: Self-pay | Admitting: Internal Medicine

## 2019-04-06 ENCOUNTER — Other Ambulatory Visit: Payer: Self-pay | Admitting: *Deleted

## 2019-04-06 NOTE — Telephone Encounter (Signed)
Patient's wife called in to check of status of refill.

## 2019-04-06 NOTE — Telephone Encounter (Signed)
Copied from [place] [id number]. Topic: Quick Communication - Rx Refill/Question >> Apr 06, 2019 10:33 AM Mcneil, Jacinto Reap wrote: Medication: morphine (MS CONTIN) 100 MG 12 hr tablet and morphine (MSIR) 15 MG tablet  Has the patient contacted their pharmacy? yes   Preferred Pharmacy (with phone number or street name): [id number] - [place], [place] - [address] [phone] (Phone) [phone] (Fax)  Agent: Please be advised that RX refills may take up to 3 business days. We ask that you follow-up with your pharmacy.

## 2019-04-07 NOTE — Telephone Encounter (Signed)
Please advise on refill.

## 2019-04-07 NOTE — Telephone Encounter (Signed)
Last OV /01/26/19 Next OV  05/02/19  [place] Controlled Substance Database checked. Last filled on 03/06/19/

## 2019-04-07 NOTE — Telephone Encounter (Signed)
Pt wife is calling checking on refills and would like betty to return her call

## 2019-04-08 ENCOUNTER — Telehealth: Payer: Self-pay | Admitting: Internal Medicine

## 2019-04-08 MED ORDER — MORPHINE SULFATE 15 MG PO TABS: 15.0000 mg | ORAL_TABLET | Freq: Two times a day (BID) | ORAL | 0 refills | Status: DC | PRN

## 2019-04-08 MED ORDER — MORPHINE SULFATE ER 100 MG PO TBCR: 200.0000 mg | EXTENDED_RELEASE_TABLET | Freq: Three times a day (TID) | ORAL | 0 refills | Status: DC

## 2019-04-08 NOTE — Telephone Encounter (Signed)
MD approve and sent refill to [facility].Marland KitchenJohny Chess

## 2019-04-08 NOTE — Telephone Encounter (Signed)
Spoke with patients wife, Jackelyn Poling, regarding patients pain med refills. Per provider note on last OV in May, pt was to recheck in 2 months instead of the normal 3 month requirement. Provider gave 2 months of refills at that time, which is why he is 1 month short. Debbie understood and was unaware of the early recheck. Next appt is 8/31. Advd Debbie to schedule next visit after each visit, to ensure timing of refills maintained.

## 2019-04-08 NOTE — Telephone Encounter (Signed)
Pt wife called in stated that pt has been out of this med for 3 day

## 2019-05-02 ENCOUNTER — Other Ambulatory Visit: Payer: Self-pay

## 2019-05-02 ENCOUNTER — Ambulatory Visit (INDEPENDENT_AMBULATORY_CARE_PROVIDER_SITE_OTHER): Payer: No Typology Code available for payment source | Admitting: Internal Medicine

## 2019-05-02 ENCOUNTER — Encounter: Payer: Self-pay | Admitting: Internal Medicine

## 2019-05-02 ENCOUNTER — Other Ambulatory Visit: Payer: Self-pay | Admitting: Internal Medicine

## 2019-05-02 VITALS — BP 110/70 | HR 67 | Temp 98.6°F | Ht 70.0 in | Wt 250.0 lb

## 2019-05-02 DIAGNOSIS — I10 Essential (primary) hypertension: Secondary | ICD-10-CM | POA: Diagnosis not present

## 2019-05-02 DIAGNOSIS — J449 Chronic obstructive pulmonary disease, unspecified: Secondary | ICD-10-CM

## 2019-05-02 DIAGNOSIS — IMO0002 Reserved for concepts with insufficient information to code with codable children: Secondary | ICD-10-CM

## 2019-05-02 DIAGNOSIS — M79605 Pain in left leg: Secondary | ICD-10-CM

## 2019-05-02 DIAGNOSIS — M545 Low back pain, unspecified: Secondary | ICD-10-CM

## 2019-05-02 DIAGNOSIS — E1129 Type 2 diabetes mellitus with other diabetic kidney complication: Secondary | ICD-10-CM

## 2019-05-02 DIAGNOSIS — Z23 Encounter for immunization: Secondary | ICD-10-CM | POA: Diagnosis not present

## 2019-05-02 DIAGNOSIS — E1165 Type 2 diabetes mellitus with hyperglycemia: Secondary | ICD-10-CM

## 2019-05-02 MED ORDER — MORPHINE SULFATE ER 100 MG PO TBCR: 200.0000 mg | EXTENDED_RELEASE_TABLET | Freq: Three times a day (TID) | ORAL | 0 refills | Status: DC

## 2019-05-02 MED ORDER — MORPHINE SULFATE 15 MG PO TABS: 15.0000 mg | ORAL_TABLET | Freq: Two times a day (BID) | ORAL | 0 refills | Status: DC | PRN

## 2019-05-02 NOTE — Assessment & Plan Note (Signed)
amlodipine/valsartan

## 2019-05-02 NOTE — Progress Notes (Signed)
Subjective:  Patient ID: [name] [name], male    DOB: 02-21-1966  Age: 53 y.o. MRN: [id number]  CC: No chief complaint on file.   HPI [name] [name] presents for chronic low back pain, diabetes, hypertension  Outpatient Medications Prior to Visit  Medication Sig Dispense Refill  . amLODipine-valsartan (EXFORGE) 10-320 MG tablet TAKE 1 TABLET BY MOUTH DAILY. 90 tablet 3  . aspirin EC 81 MG tablet Take 81 mg by mouth daily.    . B-D UF III MINI PEN NEEDLES 31G X 5 MM MISC USE AS DIRECTED 100 each 3  . Cholecalciferol (VITAMIN D3) 1000 UNITS CAPS Take 1 capsule by mouth daily.      Marland Kitchen erythromycin ophthalmic ointment Place 1 application into the right eye 4 (four) times daily. 3.5 g 1  . fluticasone (FLONASE) 50 MCG/ACT nasal spray SPRAY 2 SPRAYS INTO EACH NOSTRIL EVERY DAY 16 mL 5  . glucose blood (ONE TOUCH ULTRA TEST) test strip USE TO CHECK BLOOD SUGARS TWICE A DAY. DX E11.9 200 each 11  . Insulin Glargine, 1 Unit Dial, (TOUJEO SOLOSTAR) 300 UNIT/ML SOPN Inject 100 Units into the skin daily. 7 pen 2  . Insulin Pen Needle (RELION PEN NEEDLE 31G/8MM) 31G X 8 MM MISC USE AS DIRECTED 3 TIMES A DAY 100 each 0  . ketorolac (TORADOL) 10 MG tablet Take 1 tablet (10 mg total) by mouth every 8 (eight) hours as needed for severe pain (renal colic). 20 tablet 0  . lovastatin (MEVACOR) 20 MG tablet TAKE 1 TABLET BY MOUTH EVERY DAY 90 tablet 3  . OneTouch Delica Lancets 33G MISC USE TO CHECK BLOOD SUGAR TWICE DAILY 100 each 3  . RELION PEN NEEDLE 31G/8MM 31G X 8 MM MISC USE AS DIRECTED THREE TIMES DAILY 100 each 11  . RELION SHORT PEN NEEDLES 31G X 8 MM MISC USE ONE NEEDLE TO INJECT INSULIN THREE TIMES DAILY 100 each 0  . umeclidinium-vilanterol (ANORO ELLIPTA) 62.5-25 MCG/INH AEPB Inhale 1 puff into the lungs daily. 3 each 3  . morphine (MS CONTIN) 100 MG 12 hr tablet Take 2 tablets (200 mg total) by mouth 3 (three) times daily. 180 tablet 0  . morphine (MS CONTIN) 100 MG 12 hr tablet Take 2  tablets (200 mg total) by mouth 3 (three) times daily. 180 tablet 0  . morphine (MSIR) 15 MG tablet Take 1 tablet (15 mg total) by mouth 2 (two) times daily as needed for severe pain. 60 tablet 0  . morphine (MSIR) 15 MG tablet Take 1 tablet (15 mg total) by mouth 2 (two) times daily as needed for severe pain. 60 tablet 0   No facility-administered medications prior to visit.     ROS: Review of Systems  Constitutional: Negative for appetite change, fatigue and unexpected weight change.  HENT: Negative for congestion, nosebleeds, sneezing, sore throat and trouble swallowing.   Eyes: Negative for itching and visual disturbance.  Respiratory: Positive for wheezing. Negative for cough.   Cardiovascular: Negative for chest pain, palpitations and leg swelling.  Gastrointestinal: Negative for abdominal distention, blood in stool, diarrhea and nausea.  Genitourinary: Negative for frequency and hematuria.  Musculoskeletal: Positive for arthralgias and back pain. Negative for gait problem, joint swelling and neck pain.  Skin: Negative for rash.  Neurological: Negative for dizziness, tremors, speech difficulty and weakness.  Psychiatric/Behavioral: Negative for agitation, dysphoric mood and sleep disturbance. The patient is not nervous/anxious.     Objective:  BP 110/70 (BP Location: Left  Arm, Patient Position: Sitting, Cuff Size: Large)   Pulse 67   Temp 98.6 F (37 C) (Oral)   Ht 5\' 10"  (1.778 m)   Wt 250 lb (113.4 kg)   SpO2 96%   BMI 35.87 kg/m   BP Readings from Last 3 Encounters:  05/02/19 110/70  10/28/18 132/78  07/27/18 130/74    Wt Readings from Last 3 Encounters:  05/02/19 250 lb (113.4 kg)  10/28/18 241 lb (109.3 kg)  07/27/18 247 lb (112 kg)    Physical Exam Constitutional:      General: He is not in acute distress.    Appearance: He is well-developed.     Comments: NAD  Eyes:     Conjunctiva/sclera: Conjunctivae normal.     Pupils: Pupils are equal, round, and  reactive to light.  Neck:     Musculoskeletal: Normal range of motion.     Thyroid: No thyromegaly.     Vascular: No JVD.  Cardiovascular:     Rate and Rhythm: Normal rate and regular rhythm.     Heart sounds: Normal heart sounds. No murmur. No friction rub. No gallop.   Pulmonary:     Effort: Pulmonary effort is normal. No respiratory distress.     Breath sounds: Normal breath sounds. No wheezing or rales.  Chest:     Chest wall: No tenderness.  Abdominal:     General: Bowel sounds are normal. There is no distension.     Palpations: Abdomen is soft. There is no mass.     Tenderness: There is no abdominal tenderness. There is no guarding or rebound.  Musculoskeletal: Normal range of motion.        General: Tenderness present.  Lymphadenopathy:     Cervical: No cervical adenopathy.  Skin:    General: Skin is warm and dry.     Findings: No rash.  Neurological:     Mental Status: He is alert and oriented to person, place, and time.     Cranial Nerves: No cranial nerve deficit.     Motor: No abnormal muscle tone.     Coordination: Coordination normal.     Gait: Gait normal.     Deep Tendon Reflexes: Reflexes are normal and symmetric.  Psychiatric:        Behavior: Behavior normal.        Thought Content: Thought content normal.        Judgment: Judgment normal.    LS spine stiff, tender with range of motion The face is flushed Lab Results  Component Value Date   WBC 10.3 04/23/2018   HGB 15.0 04/23/2018   HCT 44.0 04/23/2018   PLT 206.0 04/23/2018   GLUCOSE 231 (H) 10/28/2018   CHOL 127 04/23/2018   TRIG 86.0 04/23/2018   HDL 44.70 04/23/2018   LDLDIRECT 116.6 05/20/2007   LDLCALC 66 04/23/2018   ALT 33 04/23/2018   AST 24 04/23/2018   NA 133 (L) 10/28/2018   K 4.0 10/28/2018   CL 97 10/28/2018   CREATININE 0.79 10/28/2018   BUN 11 10/28/2018   CO2 29 10/28/2018   TSH 2.64 04/23/2018   PSA 0.20 04/23/2018   INR 1.1 05/10/2008   HGBA1C 10.6 (A) 03/11/2019    MICROALBUR 1.7 04/23/2018    No results found.  Assessment & Plan:   Diagnoses and all orders for this visit:  Need for influenza vaccination -     Flu Vaccine QUAD 36+ mos IM  Other orders -  morphine (MS CONTIN) 100 MG 12 hr tablet; Take 2 tablets (200 mg total) by mouth 3 (three) times daily. -     morphine (MSIR) 15 MG tablet; Take 1 tablet (15 mg total) by mouth 2 (two) times daily as needed for severe pain. -     Discontinue: morphine (MS CONTIN) 100 MG 12 hr tablet; Take 2 tablets (200 mg total) by mouth 3 (three) times daily. -     Discontinue: morphine (MSIR) 15 MG tablet; Take 1 tablet (15 mg total) by mouth 2 (two) times daily as needed for severe pain. -     morphine (MSIR) 15 MG tablet; Take 1 tablet (15 mg total) by mouth 2 (two) times daily as needed for severe pain. -     morphine (MS CONTIN) 100 MG 12 hr tablet; Take 2 tablets (200 mg total) by mouth 3 (three) times daily.     Meds ordered this encounter  Medications  . morphine (MS CONTIN) 100 MG 12 hr tablet    Sig: Take 2 tablets (200 mg total) by mouth 3 (three) times daily.    Dispense:  180 tablet    Refill:  0    Fill on or after 06/04/19. Dx: M54.5 and G89.4  . morphine (MSIR) 15 MG tablet    Sig: Take 1 tablet (15 mg total) by mouth 2 (two) times daily as needed for severe pain.    Dispense:  60 tablet    Refill:  0    Fill on or after 06/04/19. Dx: M54.5 and G89.4  . DISCONTD: morphine (MS CONTIN) 100 MG 12 hr tablet    Sig: Take 2 tablets (200 mg total) by mouth 3 (three) times daily.    Dispense:  180 tablet    Refill:  0    Fill on or after 05/05/19. Dx: M54.5 and G89.4  . DISCONTD: morphine (MSIR) 15 MG tablet    Sig: Take 1 tablet (15 mg total) by mouth 2 (two) times daily as needed for severe pain.    Dispense:  60 tablet    Refill:  0    Fill on or after 05/05/19. Dx: M54.5 and G89.4  . morphine (MSIR) 15 MG tablet    Sig: Take 1 tablet (15 mg total) by mouth 2 (two) times daily as needed  for severe pain.    Dispense:  60 tablet    Refill:  0    Fill on or after 07/04/19. Dx: M54.5 and G89.4  . morphine (MS CONTIN) 100 MG 12 hr tablet    Sig: Take 2 tablets (200 mg total) by mouth 3 (three) times daily.    Dispense:  180 tablet    Refill:  0    Fill on or after 07/04/19. Dx: M54.5 and G89.4     Follow-up: No follow-ups on file.  Sonda PrimesAlex , MD

## 2019-05-02 NOTE — Assessment & Plan Note (Signed)
On MSIR, MSContin   Potential benefits of a long term opioids  use as well as potential risks  and complications were explained to the patient and were aknowledged. 

## 2019-05-02 NOTE — Assessment & Plan Note (Signed)
The patient states she had a recent appointment with Dr. Loanne Drilling.  His hemoglobin A1c was checked and it was elevated.  He was switched to Toujeo due to insurance requirements

## 2019-05-02 NOTE — Assessment & Plan Note (Signed)
The patient continues to smoke 

## 2019-05-03 ENCOUNTER — Other Ambulatory Visit: Payer: Self-pay | Admitting: Internal Medicine

## 2019-06-11 ENCOUNTER — Telehealth: Payer: Self-pay | Admitting: Endocrinology

## 2019-06-11 DIAGNOSIS — E1165 Type 2 diabetes mellitus with hyperglycemia: Secondary | ICD-10-CM

## 2019-06-12 NOTE — Telephone Encounter (Signed)
1.  Please schedule f/u appt 2.  Then please refill x 1, pending that appt.  

## 2019-06-13 NOTE — Telephone Encounter (Signed)
Per Dr. Ellison, unable to refill Toujeo without an appt. Routing this message to the front desk for scheduling purposes.  

## 2019-06-15 NOTE — Telephone Encounter (Signed)
LVMTCB

## 2019-06-16 ENCOUNTER — Other Ambulatory Visit: Payer: Self-pay | Admitting: Internal Medicine

## 2019-06-16 ENCOUNTER — Other Ambulatory Visit: Payer: Self-pay | Admitting: Endocrinology

## 2019-06-21 ENCOUNTER — Other Ambulatory Visit: Payer: Self-pay | Admitting: Endocrinology

## 2019-06-21 DIAGNOSIS — E1165 Type 2 diabetes mellitus with hyperglycemia: Secondary | ICD-10-CM

## 2019-06-21 NOTE — Telephone Encounter (Signed)
1.  Please schedule f/u appt 2.  Then please refill x 1, pending that appt.  

## 2019-06-21 NOTE — Telephone Encounter (Signed)
Please advise 

## 2019-06-22 NOTE — Telephone Encounter (Signed)
LMTCB x 2 to schedule appointment °

## 2019-06-23 ENCOUNTER — Other Ambulatory Visit: Payer: Self-pay | Admitting: Endocrinology

## 2019-06-23 DIAGNOSIS — E1165 Type 2 diabetes mellitus with hyperglycemia: Secondary | ICD-10-CM

## 2019-06-27 ENCOUNTER — Other Ambulatory Visit: Payer: Self-pay

## 2019-06-27 DIAGNOSIS — E1165 Type 2 diabetes mellitus with hyperglycemia: Secondary | ICD-10-CM

## 2019-06-27 MED ORDER — TOUJEO SOLOSTAR 300 UNIT/ML ~~LOC~~ SOPN: 100.0000 [IU] | PEN_INJECTOR | Freq: Every day | SUBCUTANEOUS | 0 refills | Status: DC

## 2019-06-27 NOTE — Telephone Encounter (Signed)
Patient is scheduled for appointment on 07/05/19 at 3:45 p.m.

## 2019-06-27 NOTE — Telephone Encounter (Signed)
Insulin Glargine, 1 Unit Dial, (TOUJEO SOLOSTAR) 300 UNIT/ML SOPN 30 mL 0 06/27/2019    Sig - Route: Inject 100 Units into the skin daily. - Subcutaneous   Sent to pharmacy as: Insulin Glargine, 1 Unit Dial, (TOUJEO SOLOSTAR) 300 UNIT/ML Solution Pen-injector   Notes to Pharmacy: E11.9   E-Prescribing Status: Receipt confirmed by pharmacy (06/27/2019 1:11 PM EDT)

## 2019-06-28 ENCOUNTER — Other Ambulatory Visit: Payer: Self-pay | Admitting: Endocrinology

## 2019-06-29 ENCOUNTER — Other Ambulatory Visit: Payer: Self-pay | Admitting: Endocrinology

## 2019-07-05 ENCOUNTER — Ambulatory Visit (INDEPENDENT_AMBULATORY_CARE_PROVIDER_SITE_OTHER): Payer: No Typology Code available for payment source | Admitting: Endocrinology

## 2019-07-05 ENCOUNTER — Other Ambulatory Visit: Payer: Self-pay

## 2019-07-05 ENCOUNTER — Encounter: Payer: Self-pay | Admitting: Endocrinology

## 2019-07-05 VITALS — BP 140/70 | HR 86 | Ht 70.0 in | Wt 253.8 lb

## 2019-07-05 DIAGNOSIS — E1165 Type 2 diabetes mellitus with hyperglycemia: Secondary | ICD-10-CM | POA: Diagnosis not present

## 2019-07-05 LAB — POCT GLYCOSYLATED HEMOGLOBIN (HGB A1C): Hemoglobin A1C: 8.5 % — AB (ref 4.0–5.6)

## 2019-07-05 MED ORDER — TOUJEO MAX SOLOSTAR 300 UNIT/ML ~~LOC~~ SOPN: 100.0000 [IU] | PEN_INJECTOR | SUBCUTANEOUS | 3 refills | Status: DC

## 2019-07-05 NOTE — Patient Instructions (Addendum)
Your blood pressure is high today.  Please see your primary care provider soon, to have it rechecked check your blood sugar twice a day.  vary the time of day when you check, between before the 3 meals, and at bedtime.  also check if you have symptoms of your blood sugar being too high or too low.  please keep a record of the readings and bring it to your next appointment here.  You can write it on any piece of paper.  please call us sooner if your blood sugar goes below 70, or if you have a lot of readings over 200. Please take the full amount of Toujeo each morning. On this type of insulin schedule, you should eat meals on a regular schedule (especially lunch).  If a meal is missed or significantly delayed, your blood sugar could go low.  Please come back for a follow-up appointment in 2 months.

## 2019-07-05 NOTE — Progress Notes (Signed)
Subjective:    Patient ID: [name] [name], male    DOB: July 24, 1966, 52 y.o.   MRN: [id number]  HPI Pt returns for f/u of diabetes mellitus:  DM type: Insulin-requiring type 2 Dx'ed: 9024 Complications: none Therapy: insulin since 2007 DKA: never Severe hypoglycemia: never.  Pancreatitis: never.   Other: he takes QD insulin, due to h/o noncompliance; he works M-F, as a Development worker, community.   Interval history: He has mild hypoglycemia in the late afternoon, approx QOD.  He says cbg varies from 55-300.  He says he never misses the insulin, but he sometimes takes less than the rx'ed dosage, out of fear of hypoglycemia.  No new sxs.   Past Medical History:  Diagnosis Date  . COPD (chronic obstructive pulmonary disease) ([facility])   . Diabetes mellitus   . GERD (gastroesophageal reflux disease)   . Hyperlipidemia   . Hypertension   . Lower back pain   . Nephrolithiasis    Dr Jeffie Pollock    Past Surgical History:  Procedure Laterality Date  . KNEE SURGERY      Social History   Socioeconomic History  . Marital status: Married    Spouse name: Not on file  . Number of children: 2  . Years of education: Not on file  . Highest education level: Not on file  Occupational History  . Not on file  Social Needs  . Financial resource strain: Not on file  . Food insecurity    Worry: Not on file    Inability: Not on file  . Transportation needs    Medical: Not on file    Non-medical: Not on file  Tobacco Use  . Smoking status: Current Every Day Smoker    Packs/day: 2.00    Types: Cigarettes  . Smokeless tobacco: Never Used  Substance and Sexual Activity  . Alcohol use: No  . Drug use: No  . Sexual activity: Yes  Lifestyle  . Physical activity    Days per week: Not on file    Minutes per session: Not on file  . Stress: Not on file  Relationships  . Social Herbalist on phone: Not on file    Gets together: Not on file    Attends religious service: Not on file    Active member of  club or organization: Not on file    Attends meetings of clubs or organizations: Not on file    Relationship status: Not on file  . Intimate partner violence    Fear of current or ex partner: Not on file    Emotionally abused: Not on file    Physically abused: Not on file    Forced sexual activity: Not on file  Other Topics Concern  . Not on file  Social History Narrative   No regular exercise    Current Outpatient Medications on File Prior to Visit  Medication Sig Dispense Refill  . amLODipine-valsartan (EXFORGE) 10-320 MG tablet TAKE 1 TABLET BY MOUTH DAILY. 90 tablet 3  . ANORO ELLIPTA 62.5-25 MCG/INH AEPB INHALE 1 PUFF INTO THE LUNGS EVERY DAY 180 each 3  . aspirin EC 81 MG tablet Take 81 mg by mouth daily.    . Cholecalciferol (VITAMIN D3) 1000 UNITS CAPS Take 1 capsule by mouth daily.      . fluticasone (FLONASE) 50 MCG/ACT nasal spray SPRAY 2 SPRAYS INTO EACH NOSTRIL EVERY DAY 16 mL 5  . Insulin Glargine, 1 Unit Dial, (TOUJEO SOLOSTAR) 300 UNIT/ML SOPN Inject  100 Units into the skin daily. 30 mL 0  . ketorolac (TORADOL) 10 MG tablet Take 1 tablet (10 mg total) by mouth every 8 (eight) hours as needed for severe pain (renal colic). 20 tablet 0  . lovastatin (MEVACOR) 20 MG tablet TAKE 1 TABLET BY MOUTH EVERY DAY 90 tablet 3  . morphine (MS CONTIN) 100 MG 12 hr tablet Take 2 tablets (200 mg total) by mouth 3 (three) times daily. 180 tablet 0  . morphine (MSIR) 15 MG tablet Take 1 tablet (15 mg total) by mouth 2 (two) times daily as needed for severe pain. 60 tablet 0  . OneTouch Delica Lancets 33G MISC USE TO CHECK BLOOD SUGAR TWICE DAILY 100 each 3  . ONETOUCH ULTRA test strip USE TO CHECK BLOOD SUGARS TWICE A DAY. DX E11.9 50 strip 23  . RELION SHORT PEN NEEDLES 31G X 8 MM MISC USE ONE NEEDLE TO INJECT INSULIN THREE TIMES DAILY 100 each 0   No current facility-administered medications on file prior to visit.     Allergies  Allergen Reactions  . Belviq [Lorcaserin Hcl]      wierd  . Benazepril Hcl     REACTION: cough  . Nsaids     Can take short term Upset stomach    Family History  Problem Relation Age of Onset  . Hypertension Mother   . Diabetes Mother   . CAD Mother   . Heart disease Other        CAD male relative <60  . Vision loss Sister     BP 140/70 (BP Location: Right Arm, Patient Position: Sitting, Cuff Size: Large)   Pulse 86   Ht 5\' 10"  (1.778 m)   Wt 253 lb 12.8 oz (115.1 kg)   SpO2 95%   BMI 36.42 kg/m    Review of Systems Denies LOC    Objective:   Physical Exam VITAL SIGNS:  See vs page GENERAL: no distress Pulses: dorsalis pedis intact bilat.   MSK: no deformity of the feet CV: 2+ bilat leg edema Skin:  no ulcer on the feet.  normal color and temp on the feet. Neuro: sensation is intact to touch on the feet.    Lab Results  Component Value Date   HGBA1C 8.5 (A) 07/05/2019   Lab Results  Component Value Date   CREATININE 0.79 10/28/2018   BUN 11 10/28/2018   NA 133 (L) 10/28/2018   K 4.0 10/28/2018   CL 97 10/28/2018   CO2 29 10/28/2018        Assessment & Plan:  HTN: is noted today Insulin-requiring type 2 DM: he needs increased rx Hypoglycemia: this limits aggressiveness of glycemic control   Patient Instructions  Your blood pressure is high today.  Please see your primary care provider soon, to have it rechecked check your blood sugar twice a day.  vary the time of day when you check, between before the 3 meals, and at bedtime.  also check if you have symptoms of your blood sugar being too high or too low.  please keep a record of the readings and bring it to your next appointment here.  You can write it on any piece of paper.  please call 10/30/2018 sooner if your blood sugar goes below 70, or if you have a lot of readings over 200. Please take the full amount of Toujeo each morning. On this type of insulin schedule, you should eat meals on a regular schedule (especially lunch).  If a meal is missed or  significantly delayed, your blood sugar could go low.  Please come back for a follow-up appointment in 2 months.

## 2019-08-01 ENCOUNTER — Ambulatory Visit (INDEPENDENT_AMBULATORY_CARE_PROVIDER_SITE_OTHER): Payer: No Typology Code available for payment source | Admitting: Internal Medicine

## 2019-08-01 ENCOUNTER — Encounter: Payer: Self-pay | Admitting: Internal Medicine

## 2019-08-01 ENCOUNTER — Other Ambulatory Visit: Payer: Self-pay

## 2019-08-01 DIAGNOSIS — E1165 Type 2 diabetes mellitus with hyperglycemia: Secondary | ICD-10-CM

## 2019-08-01 DIAGNOSIS — M79605 Pain in left leg: Secondary | ICD-10-CM

## 2019-08-01 DIAGNOSIS — M79604 Pain in right leg: Secondary | ICD-10-CM

## 2019-08-01 DIAGNOSIS — Z6837 Body mass index (BMI) 37.0-37.9, adult: Secondary | ICD-10-CM

## 2019-08-01 DIAGNOSIS — I10 Essential (primary) hypertension: Secondary | ICD-10-CM

## 2019-08-01 DIAGNOSIS — M545 Low back pain: Secondary | ICD-10-CM | POA: Diagnosis not present

## 2019-08-01 DIAGNOSIS — E1129 Type 2 diabetes mellitus with other diabetic kidney complication: Secondary | ICD-10-CM

## 2019-08-01 DIAGNOSIS — IMO0002 Reserved for concepts with insufficient information to code with codable children: Secondary | ICD-10-CM

## 2019-08-01 MED ORDER — MORPHINE SULFATE 15 MG PO TABS: 15.0000 mg | ORAL_TABLET | Freq: Two times a day (BID) | ORAL | 0 refills | Status: DC | PRN

## 2019-08-01 MED ORDER — MORPHINE SULFATE ER 100 MG PO TBCR: 200.0000 mg | EXTENDED_RELEASE_TABLET | Freq: Three times a day (TID) | ORAL | 0 refills | Status: DC

## 2019-08-01 NOTE — Assessment & Plan Note (Signed)
On MSIR, MSContin   Potential benefits of a long term opioids  use as well as potential risks  and complications were explained to the patient and were aknowledged. 

## 2019-08-01 NOTE — Patient Instructions (Addendum)
Support knee highs

## 2019-08-01 NOTE — Progress Notes (Signed)
Subjective:  Patient ID: [name] [name], male    DOB: 12-08-1965  Age: 53 y.o. MRN: [id number]  CC: No chief complaint on file.   HPI [name] [name] presents for LBP, DM, HTN  Outpatient Medications Prior to Visit  Medication Sig Dispense Refill  . amLODipine-valsartan (EXFORGE) 10-320 MG tablet TAKE 1 TABLET BY MOUTH DAILY. 90 tablet 3  . ANORO ELLIPTA 62.5-25 MCG/INH AEPB INHALE 1 PUFF INTO THE LUNGS EVERY DAY 180 each 3  . aspirin EC 81 MG tablet Take 81 mg by mouth daily.    . Cholecalciferol (VITAMIN D3) 1000 UNITS CAPS Take 1 capsule by mouth daily.      . fluticasone (FLONASE) 50 MCG/ACT nasal spray SPRAY 2 SPRAYS INTO EACH NOSTRIL EVERY DAY 16 mL 5  . Insulin Glargine, 1 Unit Dial, (TOUJEO SOLOSTAR) 300 UNIT/ML SOPN Inject 100 Units into the skin daily. 30 mL 0  . Insulin Glargine, 2 Unit Dial, (TOUJEO MAX SOLOSTAR) 300 UNIT/ML SOPN Inject 100 Units into the skin every morning. And pen needles 1/day. 12 pen 3  . ketorolac (TORADOL) 10 MG tablet Take 1 tablet (10 mg total) by mouth every 8 (eight) hours as needed for severe pain (renal colic). 20 tablet 0  . lovastatin (MEVACOR) 20 MG tablet TAKE 1 TABLET BY MOUTH EVERY DAY 90 tablet 3  . morphine (MS CONTIN) 100 MG 12 hr tablet Take 2 tablets (200 mg total) by mouth 3 (three) times daily. 180 tablet 0  . morphine (MSIR) 15 MG tablet Take 1 tablet (15 mg total) by mouth 2 (two) times daily as needed for severe pain. 60 tablet 0  . OneTouch Delica Lancets 33G MISC USE TO CHECK BLOOD SUGAR TWICE DAILY 100 each 3  . ONETOUCH ULTRA test strip USE TO CHECK BLOOD SUGARS TWICE A DAY. DX E11.9 50 strip 23  . RELION SHORT PEN NEEDLES 31G X 8 MM MISC USE ONE NEEDLE TO INJECT INSULIN THREE TIMES DAILY 100 each 0   No facility-administered medications prior to visit.     ROS: Review of Systems  Constitutional: Negative for appetite change, fatigue and unexpected weight change.  HENT: Negative for congestion, nosebleeds, sneezing,  sore throat and trouble swallowing.   Eyes: Negative for itching and visual disturbance.  Respiratory: Negative for cough.   Cardiovascular: Negative for chest pain, palpitations and leg swelling.  Gastrointestinal: Negative for abdominal distention, blood in stool, diarrhea and nausea.  Genitourinary: Negative for frequency and hematuria.  Musculoskeletal: Positive for back pain. Negative for gait problem, joint swelling and neck pain.  Skin: Negative for rash.  Neurological: Negative for dizziness, tremors, speech difficulty and weakness.  Psychiatric/Behavioral: Negative for agitation, dysphoric mood, sleep disturbance and suicidal ideas. The patient is not nervous/anxious.     Objective:  BP 134/78 (BP Location: Left Arm, Patient Position: Sitting, Cuff Size: Large)   Pulse (!) 41   Temp 98.2 F (36.8 C) (Oral)   Ht 5\' 10"  (1.778 m)   Wt 253 lb (114.8 kg)   SpO2 99%   BMI 36.30 kg/m   BP Readings from Last 3 Encounters:  08/01/19 134/78  07/05/19 140/70  05/02/19 110/70    Wt Readings from Last 3 Encounters:  08/01/19 253 lb (114.8 kg)  07/05/19 253 lb 12.8 oz (115.1 kg)  05/02/19 250 lb (113.4 kg)    Physical Exam Constitutional:      General: He is not in acute distress.    Appearance: He is well-developed. He is  obese.     Comments: NAD  Eyes:     Conjunctiva/sclera: Conjunctivae normal.     Pupils: Pupils are equal, round, and reactive to light.  Neck:     Musculoskeletal: Normal range of motion.     Thyroid: No thyromegaly.     Vascular: No JVD.  Cardiovascular:     Rate and Rhythm: Normal rate and regular rhythm.     Heart sounds: Normal heart sounds. No murmur. No friction rub. No gallop.   Pulmonary:     Effort: Pulmonary effort is normal. No respiratory distress.     Breath sounds: Normal breath sounds. No wheezing or rales.  Chest:     Chest wall: No tenderness.  Abdominal:     General: Bowel sounds are normal. There is no distension.      Palpations: Abdomen is soft. There is no mass.     Tenderness: There is no abdominal tenderness. There is no guarding or rebound.  Musculoskeletal: Normal range of motion.        General: Tenderness present.  Lymphadenopathy:     Cervical: No cervical adenopathy.  Skin:    General: Skin is warm and dry.     Findings: No rash.  Neurological:     Mental Status: He is alert and oriented to person, place, and time.     Cranial Nerves: No cranial nerve deficit.     Motor: No abnormal muscle tone.     Coordination: Coordination normal.     Gait: Gait abnormal.     Deep Tendon Reflexes: Reflexes are normal and symmetric.  Psychiatric:        Behavior: Behavior normal.        Thought Content: Thought content normal.        Judgment: Judgment normal.   LS w/pain Trace edema B  Lab Results  Component Value Date   WBC 10.3 04/23/2018   HGB 15.0 04/23/2018   HCT 44.0 04/23/2018   PLT 206.0 04/23/2018   GLUCOSE 231 (H) 10/28/2018   CHOL 127 04/23/2018   TRIG 86.0 04/23/2018   HDL 44.70 04/23/2018   LDLDIRECT 116.6 05/20/2007   LDLCALC 66 04/23/2018   ALT 33 04/23/2018   AST 24 04/23/2018   NA 133 (L) 10/28/2018   K 4.0 10/28/2018   CL 97 10/28/2018   CREATININE 0.79 10/28/2018   BUN 11 10/28/2018   CO2 29 10/28/2018   TSH 2.64 04/23/2018   PSA 0.20 04/23/2018   INR 1.1 05/10/2008   HGBA1C 8.5 (A) 07/05/2019   MICROALBUR 1.7 04/23/2018    No results found.  Assessment & Plan:   There are no diagnoses linked to this encounter.   No orders of the defined types were placed in this encounter.    Follow-up: No follow-ups on file.  Walker Kehr, MD

## 2019-08-01 NOTE — Assessment & Plan Note (Signed)
Amlod-Valsartan 

## 2019-08-01 NOTE — Assessment & Plan Note (Signed)
Wt Readings from Last 3 Encounters:  08/01/19 253 lb (114.8 kg)  07/05/19 253 lb 12.8 oz (115.1 kg)  05/02/19 250 lb (113.4 kg)

## 2019-08-01 NOTE — Assessment & Plan Note (Signed)
F/u w/Dr Nance Pew

## 2019-09-05 ENCOUNTER — Other Ambulatory Visit: Payer: Self-pay | Admitting: Internal Medicine

## 2019-09-07 ENCOUNTER — Other Ambulatory Visit: Payer: Self-pay

## 2019-09-07 ENCOUNTER — Encounter: Payer: Self-pay | Admitting: Endocrinology

## 2019-09-07 ENCOUNTER — Ambulatory Visit (INDEPENDENT_AMBULATORY_CARE_PROVIDER_SITE_OTHER): Payer: No Typology Code available for payment source | Admitting: Endocrinology

## 2019-09-07 VITALS — BP 118/82 | HR 100 | Ht 70.0 in | Wt 251.4 lb

## 2019-09-07 DIAGNOSIS — E1129 Type 2 diabetes mellitus with other diabetic kidney complication: Secondary | ICD-10-CM | POA: Diagnosis not present

## 2019-09-07 DIAGNOSIS — IMO0002 Reserved for concepts with insufficient information to code with codable children: Secondary | ICD-10-CM

## 2019-09-07 DIAGNOSIS — E1165 Type 2 diabetes mellitus with hyperglycemia: Secondary | ICD-10-CM | POA: Diagnosis not present

## 2019-09-07 LAB — POCT GLYCOSYLATED HEMOGLOBIN (HGB A1C): Hemoglobin A1C: 8 % — AB (ref 4.0–5.6)

## 2019-09-07 MED ORDER — TOUJEO MAX SOLOSTAR 300 UNIT/ML ~~LOC~~ SOPN: 80.0000 [IU] | PEN_INJECTOR | SUBCUTANEOUS | 3 refills | Status: DC

## 2019-09-07 MED ORDER — RYBELSUS 3 MG PO TABS: 3.0000 mg | ORAL_TABLET | ORAL | 11 refills | Status: DC

## 2019-09-07 MED ORDER — ONETOUCH ULTRA VI STRP: 1.0000 | ORAL_STRIP | Freq: Two times a day (BID) | 3 refills | Status: DC

## 2019-09-07 NOTE — Patient Instructions (Addendum)
I have sent a prescription to your pharmacy, to add "Rybelsus," and: Reduce the Toujeo to 80 units each morning. check your blood sugar twice a day.  vary the time of day when you check, between before the 3 meals, and at bedtime.  also check if you have symptoms of your blood sugar being too high or too low.  please keep a record of the readings and bring it to your next appointment here.  You can write it on any piece of paper.  please call us sooner if your blood sugar goes below 70, or if you have a lot of readings over 200.   On this type of insulin schedule, you should eat meals on a regular schedule (especially lunch).  If a meal is missed or significantly delayed, your blood sugar could go low.  Please come back for a follow-up appointment in 2 months.

## 2019-09-07 NOTE — Progress Notes (Signed)
Subjective:    Patient ID: [name] [name], male    DOB: 19-Aug-1966, 54 y.o.   MRN: [id number]  HPI Pt returns for f/u of diabetes mellitus:  DM type: Insulin-requiring type 2 Dx'ed: 2005 Complications: none Therapy: insulin since 2007 DKA: never Severe hypoglycemia: never.  Pancreatitis: never.   Other: he takes QD insulin, due to h/o noncompliance; he works M-F, as a Nutritional therapist.   Interval history: no cbg record, but states cbg varies from 55-200's.  He says he never misses the insulin.  He has mild hypoglycemia approx twice per week.  This happens before lunch, or in the afternoon.  He says cbg's on work days=days off.  No new sxs.   Past Medical History:  Diagnosis Date  . COPD (chronic obstructive pulmonary disease) ([facility])   . Diabetes mellitus   . GERD (gastroesophageal reflux disease)   . Hyperlipidemia   . Hypertension   . Lower back pain   . Nephrolithiasis    Dr Annabell Howells    Past Surgical History:  Procedure Laterality Date  . KNEE SURGERY      Social History   Socioeconomic History  . Marital status: Married    Spouse name: Not on file  . Number of children: 2  . Years of education: Not on file  . Highest education level: Not on file  Occupational History  . Not on file  Tobacco Use  . Smoking status: Current Every Day Smoker    Packs/day: 2.00    Types: Cigarettes  . Smokeless tobacco: Never Used  Substance and Sexual Activity  . Alcohol use: No  . Drug use: No  . Sexual activity: Yes  Other Topics Concern  . Not on file  Social History Narrative   No regular exercise   Social Determinants of Health   Financial Resource Strain:   . Difficulty of Paying Living Expenses: Not on file  Food Insecurity:   . Worried About Programme researcher, broadcasting/film/video in the Last Year: Not on file  . Ran Out of Food in the Last Year: Not on file  Transportation Needs:   . Lack of Transportation (Medical): Not on file  . Lack of Transportation (Non-Medical): Not on file    Physical Activity:   . Days of Exercise per Week: Not on file  . Minutes of Exercise per Session: Not on file  Stress:   . Feeling of Stress : Not on file  Social Connections:   . Frequency of Communication with Friends and Family: Not on file  . Frequency of Social Gatherings with Friends and Family: Not on file  . Attends Religious Services: Not on file  . Active Member of Clubs or Organizations: Not on file  . Attends Banker Meetings: Not on file  . Marital Status: Not on file  Intimate Partner Violence:   . Fear of Current or Ex-Partner: Not on file  . Emotionally Abused: Not on file  . Physically Abused: Not on file  . Sexually Abused: Not on file    Current Outpatient Medications on File Prior to Visit  Medication Sig Dispense Refill  . amLODipine-valsartan (EXFORGE) 10-320 MG tablet TAKE 1 TABLET BY MOUTH DAILY. 90 tablet 3  . ANORO ELLIPTA 62.5-25 MCG/INH AEPB INHALE 1 PUFF INTO THE LUNGS EVERY DAY 180 each 3  . aspirin EC 81 MG tablet Take 81 mg by mouth daily.    . Cholecalciferol (VITAMIN D3) 1000 UNITS CAPS Take 1 capsule by mouth daily.      Marland Kitchen  fluticasone (FLONASE) 50 MCG/ACT nasal spray SPRAY 2 SPRAYS INTO EACH NOSTRIL EVERY DAY 16 mL 5  . ketorolac (TORADOL) 10 MG tablet Take 1 tablet (10 mg total) by mouth every 8 (eight) hours as needed for severe pain (renal colic). 20 tablet 0  . lovastatin (MEVACOR) 20 MG tablet TAKE 1 TABLET BY MOUTH EVERY DAY 90 tablet 3  . morphine (MS CONTIN) 100 MG 12 hr tablet Take 2 tablets (200 mg total) by mouth 3 (three) times daily. 180 tablet 0  . morphine (MS CONTIN) 100 MG 12 hr tablet Take 2 tablets (200 mg total) by mouth 3 (three) times daily. 180 tablet 0  . morphine (MS CONTIN) 100 MG 12 hr tablet Take 2 tablets (200 mg total) by mouth 3 (three) times daily. 180 tablet 0  . morphine (MSIR) 15 MG tablet Take 1 tablet (15 mg total) by mouth 2 (two) times daily as needed for severe pain. 60 tablet 0  . morphine  (MSIR) 15 MG tablet Take 1 tablet (15 mg total) by mouth 2 (two) times daily as needed for severe pain. 60 tablet 0  . morphine (MSIR) 15 MG tablet Take 1 tablet (15 mg total) by mouth 2 (two) times daily as needed for severe pain. 60 tablet 0  . OneTouch Delica Lancets 33G MISC USE TO CHECK BLOOD SUGAR TWICE DAILY 100 each 3  . RELION SHORT PEN NEEDLES 31G X 8 MM MISC USE ONE NEEDLE TO INJECT INSULIN THREE TIMES DAILY 100 each 0   No current facility-administered medications on file prior to visit.    Allergies  Allergen Reactions  . Belviq [Lorcaserin Hcl]     wierd  . Benazepril Hcl     REACTION: cough  . Nsaids     Can take short term Upset stomach    Family History  Problem Relation Age of Onset  . Hypertension Mother   . Diabetes Mother   . CAD Mother   . Heart disease Other        CAD male relative <60  . Vision loss Sister     BP 118/82 (BP Location: Left Arm, Patient Position: Sitting, Cuff Size: Large)   Pulse 100   Ht 5\' 10"  (1.778 m)   Wt 251 lb 6.4 oz (114 kg)   SpO2 94%   BMI 36.07 kg/m    Review of Systems Denies LOC.      Objective:   Physical Exam VITAL SIGNS:  See vs page GENERAL: no distress Pulses: dorsalis pedis intact bilat.   MSK: no deformity of the feet CV: 2+ bilat leg edema Skin:  no ulcer on the feet.  normal color and temp on the feet. Neuro: sensation is intact to touch on the feet Ext: there is bilateral onychomycosis of the toenails.    Lab Results  Component Value Date   HGBA1C 8.0 (A) 09/07/2019   Lab Results  Component Value Date   CREATININE 0.79 10/28/2018   BUN 11 10/28/2018   NA 133 (L) 10/28/2018   K 4.0 10/28/2018   CL 97 10/28/2018   CO2 29 10/28/2018       Assessment & Plan:  Insulin-requiring type 2 DM: he needs increased rx Hypoglycemia: this limits aggressiveness of glycemic control Edema: This limits rx options   Patient Instructions  I have sent a prescription to your pharmacy, to add  "Rybelsus," and: Reduce the Toujeo to 80 units each morning. check your blood sugar twice a day.  vary the  time of day when you check, between before the 3 meals, and at bedtime.  also check if you have symptoms of your blood sugar being too high or too low.  please keep a record of the readings and bring it to your next appointment here.  You can write it on any piece of paper.  please call us sooner if your blood sugar goes below 70, or if you have a lot of readings over 200.   On this type of insulin schedule, you should eat meals on a regular schedule (especially lunch).  If a meal is missed or significantly delayed, your blood sugar could go low.  Please come back for a follow-up appointment in 2 months.

## 2019-10-11 ENCOUNTER — Telehealth: Payer: Self-pay | Admitting: Endocrinology

## 2019-10-11 ENCOUNTER — Telehealth: Payer: Self-pay

## 2019-10-11 ENCOUNTER — Other Ambulatory Visit: Payer: Self-pay

## 2019-10-11 DIAGNOSIS — IMO0002 Reserved for concepts with insufficient information to code with codable children: Secondary | ICD-10-CM

## 2019-10-11 DIAGNOSIS — E1129 Type 2 diabetes mellitus with other diabetic kidney complication: Secondary | ICD-10-CM

## 2019-10-11 MED ORDER — RYBELSUS 3 MG PO TABS: 3.0000 mg | ORAL_TABLET | ORAL | 11 refills | Status: DC

## 2019-10-11 NOTE — Telephone Encounter (Signed)
APPROVAL  Received notification from Cover My Meds/[employer] [facility] that PA for Rybelsus has been approved.  [name] [name] (Key: [id number])  This request has received a Favorable outcome.  Please note any additional information provided by [facility] at the bottom of your screen.  You will also receive a faxed copy of the determination.  [name] [name] (Key: [id number]) Rybelsus 3MG  tablets   Form [employer] Electronic Prior Authorization Form (2017 NCPDP) Created 19 minutes ago Sent to Plan 18 minutes ago Plan Response 18 minutes ago Submit Clinical Questions 14 minutes ago Determination Favorable 4 minutes ago Message from Plan Request Reference Number: [id number]. RYBELSUS TAB 3MG  is approved through 10/10/2020. Your patient may now fill this prescription and it will be covered.

## 2019-10-11 NOTE — Telephone Encounter (Signed)
PRIOR AUTHORIZATION  PA initiation date: 10/11/19   Insurance Company: [facility] [facility] Submission completed electronically through [employer] My Meds: Yes  Will await insurance response re: approval/denial.  [name] [name] (Key: [id number])  [facility] is reviewing your PA request. Typically an electronic response will be received within 72 hours. To check for an update later, open this request from your dashboard.  You may close this dialog and return to your dashboard to perform other tasks.  [name] [name] (Key: [id number]) Rybelsus 3MG  tablets   Form [facility] Electronic Prior Authorization Form (2017 NCPDP) Created 8 minutes ago Sent to Plan 7 minutes ago Plan Response 7 minutes ago Submit Clinical Questions 3 minutes ago Determination Wait for Determination Please wait for [employer] 2017 NCPDP to return a determination.

## 2019-10-11 NOTE — Telephone Encounter (Signed)
Patient's wife called asking if we had gotten the PA for the rybelsus - she said its been about a month. Ph# [phone]  Pharmacy ph# [phone]

## 2019-10-11 NOTE — Telephone Encounter (Signed)
Ok I will let her know they need to re send it.

## 2019-10-11 NOTE — Telephone Encounter (Signed)
There has not been a request from either the pharmacy nor the insurance company indicating any need for a PA.

## 2019-11-01 ENCOUNTER — Ambulatory Visit: Payer: No Typology Code available for payment source | Admitting: Internal Medicine

## 2019-11-01 ENCOUNTER — Other Ambulatory Visit: Payer: Self-pay

## 2019-11-01 ENCOUNTER — Encounter: Payer: Self-pay | Admitting: Internal Medicine

## 2019-11-01 VITALS — BP 132/76 | HR 88 | Temp 98.2°F | Ht 70.0 in | Wt 244.0 lb

## 2019-11-01 DIAGNOSIS — M545 Low back pain, unspecified: Secondary | ICD-10-CM

## 2019-11-01 DIAGNOSIS — I2583 Coronary atherosclerosis due to lipid rich plaque: Secondary | ICD-10-CM

## 2019-11-01 DIAGNOSIS — Z6837 Body mass index (BMI) 37.0-37.9, adult: Secondary | ICD-10-CM

## 2019-11-01 DIAGNOSIS — E1165 Type 2 diabetes mellitus with hyperglycemia: Secondary | ICD-10-CM

## 2019-11-01 DIAGNOSIS — E1129 Type 2 diabetes mellitus with other diabetic kidney complication: Secondary | ICD-10-CM | POA: Diagnosis not present

## 2019-11-01 DIAGNOSIS — E785 Hyperlipidemia, unspecified: Secondary | ICD-10-CM | POA: Diagnosis not present

## 2019-11-01 DIAGNOSIS — IMO0002 Reserved for concepts with insufficient information to code with codable children: Secondary | ICD-10-CM

## 2019-11-01 DIAGNOSIS — M79605 Pain in left leg: Secondary | ICD-10-CM

## 2019-11-01 DIAGNOSIS — I2510 Atherosclerotic heart disease of native coronary artery without angina pectoris: Secondary | ICD-10-CM

## 2019-11-01 MED ORDER — MORPHINE SULFATE ER 100 MG PO TBCR: 200.0000 mg | EXTENDED_RELEASE_TABLET | Freq: Three times a day (TID) | ORAL | 0 refills | Status: DC

## 2019-11-01 MED ORDER — MORPHINE SULFATE 15 MG PO TABS: 15.0000 mg | ORAL_TABLET | Freq: Two times a day (BID) | ORAL | 0 refills | Status: DC | PRN

## 2019-11-01 NOTE — Progress Notes (Signed)
Subjective:  Patient ID: [name] [name], male    DOB: [birth year]  Age: 54 y.o. MRN: [id number]  CC: No chief complaint on file.   HPI [name] [name] presents for DM, dyslipidemia, CAD, LBP  Outpatient Medications Prior to Visit  Medication Sig Dispense Refill  . amLODipine-valsartan (EXFORGE) 10-320 MG tablet TAKE 1 TABLET BY MOUTH DAILY. 90 tablet 3  . ANORO ELLIPTA 62.5-25 MCG/INH AEPB INHALE 1 PUFF INTO THE LUNGS EVERY DAY 180 each 3  . aspirin EC 81 MG tablet Take 81 mg by mouth daily.    . Cholecalciferol (VITAMIN D3) 1000 UNITS CAPS Take 1 capsule by mouth daily.      . fluticasone (FLONASE) 50 MCG/ACT nasal spray SPRAY 2 SPRAYS INTO EACH NOSTRIL EVERY DAY 16 mL 5  . glucose blood (ONETOUCH ULTRA) test strip 1 each by Other route 2 (two) times daily. And lancets 2/day 180 strip 3  . Insulin Glargine, 2 Unit Dial, (TOUJEO MAX SOLOSTAR) 300 UNIT/ML SOPN Inject 80 Units into the skin every morning. And pen needles 1/day. 12 pen 3  . ketorolac (TORADOL) 10 MG tablet Take 1 tablet (10 mg total) by mouth every 8 (eight) hours as needed for severe pain (renal colic). 20 tablet 0  . lovastatin (MEVACOR) 20 MG tablet TAKE 1 TABLET BY MOUTH EVERY DAY 90 tablet 3  . morphine (MS CONTIN) 100 MG 12 hr tablet Take 2 tablets (200 mg total) by mouth 3 (three) times daily. 180 tablet 0  . morphine (MS CONTIN) 100 MG 12 hr tablet Take 2 tablets (200 mg total) by mouth 3 (three) times daily. 180 tablet 0  . morphine (MS CONTIN) 100 MG 12 hr tablet Take 2 tablets (200 mg total) by mouth 3 (three) times daily. 180 tablet 0  . morphine (MSIR) 15 MG tablet Take 1 tablet (15 mg total) by mouth 2 (two) times daily as needed for severe pain. 60 tablet 0  . morphine (MSIR) 15 MG tablet Take 1 tablet (15 mg total) by mouth 2 (two) times daily as needed for severe pain. 60 tablet 0  . morphine (MSIR) 15 MG tablet Take 1 tablet (15 mg total) by mouth 2 (two) times daily as needed for severe pain. 60 tablet 0   . OneTouch Delica Lancets 33G MISC USE TO CHECK BLOOD SUGAR TWICE DAILY 100 each 3  . RELION SHORT PEN NEEDLES 31G X 8 MM MISC USE ONE NEEDLE TO INJECT INSULIN THREE TIMES DAILY 100 each 0  . Semaglutide (RYBELSUS) 3 MG TABS Take 3 mg by mouth every morning. 30 tablet 11   No facility-administered medications prior to visit.    ROS: Review of Systems  Constitutional: Negative for appetite change, fatigue and unexpected weight change.  HENT: Negative for congestion, nosebleeds, sneezing, sore throat and trouble swallowing.   Eyes: Negative for itching and visual disturbance.  Respiratory: Negative for cough.   Cardiovascular: Negative for chest pain, palpitations and leg swelling.  Gastrointestinal: Negative for abdominal distention, blood in stool, diarrhea and nausea.  Genitourinary: Negative for frequency and hematuria.  Musculoskeletal: Positive for back pain and gait problem. Negative for joint swelling and neck pain.  Skin: Negative for rash.  Neurological: Negative for dizziness, tremors, speech difficulty and weakness.  Psychiatric/Behavioral: Negative for agitation, dysphoric mood and sleep disturbance. The patient is not nervous/anxious.     Objective:  BP 132/76 (BP Location: Left Arm, Patient Position: Sitting, Cuff Size: Normal)   Pulse 88   Temp  98.2 F (36.8 C) (Oral)   Ht 5\' 10"  (1.778 m)   Wt 244 lb (110.7 kg)   SpO2 96%   BMI 35.01 kg/m   BP Readings from Last 3 Encounters:  11/01/19 132/76  09/07/19 118/82  08/01/19 134/78    Wt Readings from Last 3 Encounters:  11/01/19 244 lb (110.7 kg)  09/07/19 251 lb 6.4 oz (114 kg)  08/01/19 253 lb (114.8 kg)    Physical Exam Constitutional:      General: He is not in acute distress.    Appearance: He is well-developed. He is obese.     Comments: NAD  Eyes:     Conjunctiva/sclera: Conjunctivae normal.     Pupils: Pupils are equal, round, and reactive to light.  Neck:     Thyroid: No thyromegaly.      Vascular: No JVD.  Cardiovascular:     Rate and Rhythm: Normal rate and regular rhythm.     Heart sounds: Normal heart sounds. No murmur. No friction rub. No gallop.   Pulmonary:     Effort: Pulmonary effort is normal. No respiratory distress.     Breath sounds: Normal breath sounds. No wheezing or rales.  Chest:     Chest wall: No tenderness.  Abdominal:     General: Bowel sounds are normal. There is no distension.     Palpations: Abdomen is soft. There is no mass.     Tenderness: There is no abdominal tenderness. There is no guarding or rebound.  Musculoskeletal:        General: Tenderness present. Normal range of motion.     Cervical back: Normal range of motion.  Lymphadenopathy:     Cervical: No cervical adenopathy.  Skin:    General: Skin is warm and dry.     Findings: No rash.  Neurological:     Mental Status: He is alert and oriented to person, place, and time.     Cranial Nerves: No cranial nerve deficit.     Motor: No abnormal muscle tone.     Coordination: Coordination normal.     Gait: Gait normal.     Deep Tendon Reflexes: Reflexes are normal and symmetric.  Psychiatric:        Behavior: Behavior normal.        Thought Content: Thought content normal.        Judgment: Judgment normal.    LS w/pain  Lab Results  Component Value Date   WBC 10.3 04/23/2018   HGB 15.0 04/23/2018   HCT 44.0 04/23/2018   PLT 206.0 04/23/2018   GLUCOSE 231 (H) 10/28/2018   CHOL 127 04/23/2018   TRIG 86.0 04/23/2018   HDL 44.70 04/23/2018   LDLDIRECT 116.6 05/20/2007   LDLCALC 66 04/23/2018   ALT 33 04/23/2018   AST 24 04/23/2018   NA 133 (L) 10/28/2018   K 4.0 10/28/2018   CL 97 10/28/2018   CREATININE 0.79 10/28/2018   BUN 11 10/28/2018   CO2 29 10/28/2018   TSH 2.64 04/23/2018   PSA 0.20 04/23/2018   INR 1.1 05/10/2008   HGBA1C 8.0 (A) 09/07/2019   MICROALBUR 1.7 04/23/2018    MYOCARDIAL PERFUSION IMAGING  Result Date: 03/23/2018  Nuclear stress EF: 62%.  The  left ventricular ejection fraction is normal (55-65%).  There was no ST segment deviation noted during stress.  The study is normal.  This is a low risk study.  Low risk stress nuclear study with normal perfusion and normal left ventricular regional  and global systolic function.    Assessment & Plan:    Sonda Primes, MD

## 2019-11-09 ENCOUNTER — Other Ambulatory Visit: Payer: Self-pay

## 2019-11-09 ENCOUNTER — Ambulatory Visit (INDEPENDENT_AMBULATORY_CARE_PROVIDER_SITE_OTHER)
Admission: RE | Admit: 2019-11-09 | Discharge: 2019-11-09 | Disposition: A | Discharge status: Home / Self Care | Payer: Self-pay | Source: Ambulatory Visit | Attending: Internal Medicine | Admitting: Internal Medicine

## 2019-11-09 DIAGNOSIS — E1129 Type 2 diabetes mellitus with other diabetic kidney complication: Secondary | ICD-10-CM

## 2019-11-09 DIAGNOSIS — IMO0002 Reserved for concepts with insufficient information to code with codable children: Secondary | ICD-10-CM

## 2019-11-09 DIAGNOSIS — E785 Hyperlipidemia, unspecified: Secondary | ICD-10-CM

## 2019-11-09 DIAGNOSIS — E1165 Type 2 diabetes mellitus with hyperglycemia: Secondary | ICD-10-CM

## 2019-11-10 ENCOUNTER — Encounter: Payer: Self-pay | Admitting: Endocrinology

## 2019-11-10 ENCOUNTER — Ambulatory Visit (INDEPENDENT_AMBULATORY_CARE_PROVIDER_SITE_OTHER): Payer: No Typology Code available for payment source | Admitting: Endocrinology

## 2019-11-10 VITALS — BP 114/60 | HR 95 | Ht 70.0 in | Wt 243.8 lb

## 2019-11-10 DIAGNOSIS — E1129 Type 2 diabetes mellitus with other diabetic kidney complication: Secondary | ICD-10-CM

## 2019-11-10 DIAGNOSIS — IMO0002 Reserved for concepts with insufficient information to code with codable children: Secondary | ICD-10-CM

## 2019-11-10 DIAGNOSIS — E1165 Type 2 diabetes mellitus with hyperglycemia: Secondary | ICD-10-CM

## 2019-11-10 LAB — POCT GLYCOSYLATED HEMOGLOBIN (HGB A1C): Hemoglobin A1C: 7.8 % — AB (ref 4.0–5.6)

## 2019-11-10 MED ORDER — RYBELSUS 7 MG PO TABS: 7.0000 mg | ORAL_TABLET | Freq: Every day | ORAL | 3 refills | Status: DC

## 2019-11-10 MED ORDER — METFORMIN HCL ER 500 MG PO TB24: 500.0000 mg | ORAL_TABLET | Freq: Every day | ORAL | 3 refills | Status: DC

## 2019-11-10 MED ORDER — TOUJEO MAX SOLOSTAR 300 UNIT/ML ~~LOC~~ SOPN: 70.0000 [IU] | PEN_INJECTOR | SUBCUTANEOUS | 3 refills | Status: DC

## 2019-11-10 NOTE — Patient Instructions (Addendum)
I have sent a prescription to your pharmacy, to increase the Rybelsus, and: Resume the metformin, 500 mg daily.  Reduce the Toujeo to 70 units each morning. check your blood sugar twice a day.  vary the time of day when you check, between before the 3 meals, and at bedtime.  also check if you have symptoms of your blood sugar being too high or too low.  please keep a record of the readings and bring it to your next appointment here.  You can write it on any piece of paper.  please call us sooner if your blood sugar goes below 70, or if you have a lot of readings over 200.   On this type of insulin schedule, you should eat meals on a regular schedule (especially lunch).  If a meal is missed or significantly delayed, your blood sugar could go low.  Please come back for a follow-up appointment in 2 months.

## 2019-11-10 NOTE — Progress Notes (Signed)
Subjective:    Patient ID: [name] [name], male    DOB: [birth year], 54 y.o.   MRN: [id number]  HPI Pt returns for f/u of diabetes mellitus:  DM type: Insulin-requiring type 2 Dx'ed: 2005 Complications: CAD Therapy: insulin since 2007 DKA: never Severe hypoglycemia: never.  Pancreatitis: never.   Other: he takes QD insulin, due to h/o noncompliance; he works M-F, as a Nutritional therapist; He says cbg's on work days=days off. Interval history: no cbg record, but states cbg varies from 56-169.  It is in general higher as the day goes on.  He says he never misses meds.  He has mild hypoglycemia approx once per week.  This happens fasting.  No new sxs.   Past Medical History:  Diagnosis Date  . COPD (chronic obstructive pulmonary disease) (HCC)   . Diabetes mellitus   . GERD (gastroesophageal reflux disease)   . Hyperlipidemia   . Hypertension   . Lower back pain   . Nephrolithiasis    Dr Annabell Howells    Past Surgical History:  Procedure Laterality Date  . KNEE SURGERY      Social History   Socioeconomic History  . Marital status: Married    Spouse name: Not on file  . Number of children: 2  . Years of education: Not on file  . Highest education level: Not on file  Occupational History  . Not on file  Tobacco Use  . Smoking status: Current Every Day Smoker    Packs/day: 2.00    Types: Cigarettes  . Smokeless tobacco: Never Used  Substance and Sexual Activity  . Alcohol use: No  . Drug use: No  . Sexual activity: Yes  Other Topics Concern  . Not on file  Social History Narrative   No regular exercise   Social Determinants of Health   Financial Resource Strain:   . Difficulty of Paying Living Expenses:   Food Insecurity:   . Worried About Programme researcher, broadcasting/film/video in the Last Year:   . Barista in the Last Year:   Transportation Needs:   . Freight forwarder (Medical):   Marland Kitchen Lack of Transportation (Non-Medical):   Physical Activity:   . Days of Exercise per Week:   .  Minutes of Exercise per Session:   Stress:   . Feeling of Stress :   Social Connections:   . Frequency of Communication with Friends and Family:   . Frequency of Social Gatherings with Friends and Family:   . Attends Religious Services:   . Active Member of Clubs or Organizations:   . Attends Banker Meetings:   Marland Kitchen Marital Status:   Intimate Partner Violence:   . Fear of Current or Ex-Partner:   . Emotionally Abused:   Marland Kitchen Physically Abused:   . Sexually Abused:     Current Outpatient Medications on File Prior to Visit  Medication Sig Dispense Refill  . amLODipine-valsartan (EXFORGE) 10-320 MG tablet TAKE 1 TABLET BY MOUTH DAILY. 90 tablet 3  . ANORO ELLIPTA 62.5-25 MCG/INH AEPB INHALE 1 PUFF INTO THE LUNGS EVERY DAY 180 each 3  . aspirin EC 81 MG tablet Take 81 mg by mouth daily.    . Cholecalciferol (VITAMIN D3) 1000 UNITS CAPS Take 1 capsule by mouth daily.      . fluticasone (FLONASE) 50 MCG/ACT nasal spray SPRAY 2 SPRAYS INTO EACH NOSTRIL EVERY DAY 16 mL 5  . glucose blood (ONETOUCH ULTRA) test strip 1 each by Other route  2 (two) times daily. And lancets 2/day 180 strip 3  . ketorolac (TORADOL) 10 MG tablet Take 1 tablet (10 mg total) by mouth every 8 (eight) hours as needed for severe pain (renal colic). 20 tablet 0  . lovastatin (MEVACOR) 20 MG tablet TAKE 1 TABLET BY MOUTH EVERY DAY 90 tablet 3  . morphine (MS CONTIN) 100 MG 12 hr tablet Take 2 tablets (200 mg total) by mouth 3 (three) times daily. 180 tablet 0  . morphine (MS CONTIN) 100 MG 12 hr tablet Take 2 tablets (200 mg total) by mouth 3 (three) times daily. 180 tablet 0  . morphine (MS CONTIN) 100 MG 12 hr tablet Take 2 tablets (200 mg total) by mouth 3 (three) times daily. 180 tablet 0  . morphine (MSIR) 15 MG tablet Take 1 tablet (15 mg total) by mouth 2 (two) times daily as needed for severe pain. 60 tablet 0  . morphine (MSIR) 15 MG tablet Take 1 tablet (15 mg total) by mouth 2 (two) times daily as needed  for severe pain. 60 tablet 0  . morphine (MSIR) 15 MG tablet Take 1 tablet (15 mg total) by mouth 2 (two) times daily as needed for severe pain. 60 tablet 0  . OneTouch Delica Lancets 61P MISC USE TO CHECK BLOOD SUGAR TWICE DAILY 100 each 3  . RELION SHORT PEN NEEDLES 31G X 8 MM MISC USE ONE NEEDLE TO INJECT INSULIN THREE TIMES DAILY 100 each 0   No current facility-administered medications on file prior to visit.    Allergies  Allergen Reactions  . Belviq [Lorcaserin Hcl]     wierd  . Benazepril Hcl     REACTION: cough  . Nsaids     Can take short term Upset stomach    Family History  Problem Relation Age of Onset  . Hypertension Mother   . Diabetes Mother   . CAD Mother   . Heart disease Other        CAD male relative <60  . Vision loss Sister     BP 114/60 (BP Location: Left Arm, Patient Position: Sitting, Cuff Size: Large)   Pulse 95   Ht 5\' 10"  (1.778 m)   Wt 243 lb 12.8 oz (110.6 kg)   SpO2 93%   BMI 34.98 kg/m    Review of Systems Denies LOC    Objective:   Physical Exam VITAL SIGNS:  See vs page GENERAL: no distress Pulses: dorsalis pedis intact bilat.   MSK: no deformity of the feet CV: 2+ bilat leg edema Skin:  no ulcer on the feet.  normal color and temp on the feet. Neuro: sensation is intact to touch on the feet  Lab Results  Component Value Date   HGBA1C 7.8 (A) 11/10/2019   Lab Results  Component Value Date   CREATININE 0.79 10/28/2018   BUN 11 10/28/2018   NA 133 (L) 10/28/2018   K 4.0 10/28/2018   CL 97 10/28/2018   CO2 29 10/28/2018        Assessment & Plan:  Insulin-requiring type 2 DM: he would benefit from increased rx, if it can be done with a regimen that avoids or minimizes hypoglycemia.   Edema: This limits rx options.  Hypoglycemia: we'll try to de-emphasize insulin.    Patient Instructions  I have sent a prescription to your pharmacy, to increase the Rybelsus, and: Resume the metformin, 500 mg daily.  Reduce the  Toujeo to 70 units each morning. check your  blood sugar twice a day.  vary the time of day when you check, between before the 3 meals, and at bedtime.  also check if you have symptoms of your blood sugar being too high or too low.  please keep a record of the readings and bring it to your next appointment here.  You can write it on any piece of paper.  please call us sooner if your blood sugar goes below 70, or if you have a lot of readings over 200.   On this type of insulin schedule, you should eat meals on a regular schedule (especially lunch).  If a meal is missed or significantly delayed, your blood sugar could go low.  Please come back for a follow-up appointment in 2 months.

## 2019-11-11 ENCOUNTER — Telehealth: Payer: Self-pay | Admitting: Endocrinology

## 2019-11-11 NOTE — Telephone Encounter (Signed)
Called pt wife and informed about Dr. George Hugh orders below. Using closed-loop communication, pt verbalized complete acceptance and understanding of all information provided. No further questions nor concerns were voiced at this time.

## 2019-11-11 NOTE — Telephone Encounter (Signed)
Uncertain if related.  D/c this x 3 days.  If the diarrhea resolves, resume at 1/2 pill per day.  Please let me know what happens.

## 2019-11-11 NOTE — Telephone Encounter (Signed)
Please advise 

## 2019-11-11 NOTE — Telephone Encounter (Signed)
Patient's wife Gavin Pound ph# [phone] requests to be called re: Patient started taking Rybelsus 2 weeks ago (currently on 3 mg dose). Patient is having severe diarrhea and is wondering if this is related to taking Rybelsus.

## 2019-11-14 DIAGNOSIS — I2510 Atherosclerotic heart disease of native coronary artery without angina pectoris: Secondary | ICD-10-CM | POA: Insufficient documentation

## 2019-11-14 NOTE — Assessment & Plan Note (Signed)
On MSIR, MSContin   Potential benefits of a long term opioids  use as well as potential risks  and complications were explained to the patient and were aknowledged. 

## 2019-11-14 NOTE — Assessment & Plan Note (Signed)
CT coronary calcium score is 245 Discussed

## 2019-11-14 NOTE — Assessment & Plan Note (Signed)
Wt Readings from Last 3 Encounters:  11/10/19 243 lb 12.8 oz (110.6 kg)  11/01/19 244 lb (110.7 kg)  09/07/19 251 lb 6.4 oz (114 kg)

## 2019-11-16 ENCOUNTER — Telehealth: Payer: Self-pay

## 2019-11-16 DIAGNOSIS — R0989 Other specified symptoms and signs involving the circulatory and respiratory systems: Secondary | ICD-10-CM

## 2019-11-16 NOTE — Telephone Encounter (Signed)
New message    The wife calling for CT test results.

## 2019-11-17 NOTE — Telephone Encounter (Signed)
Spoke with Gavin Pound and notified her of the results and she would like to know if it would be possible to order a test to check pts carotid arteries. She states she is concerned because when he gets home from work he falls asleep almost immediately even before taking of work boots. She is concerned with a blockage or something affecting his oxygen level.  Please advise

## 2019-11-18 ENCOUNTER — Telehealth: Payer: Self-pay

## 2019-11-18 NOTE — Telephone Encounter (Signed)
error 

## 2019-11-18 NOTE — Telephone Encounter (Signed)
I will order the test.  However, there is a couple things that need to be addressed: 1.  [name] [name] needs to reduce his daily morphine intake 2.  He needs to see a sleep specialist Thanks, AP

## 2019-11-18 NOTE — Telephone Encounter (Signed)
New message    The patient's wife calling wanted to know would it be reasonable to wait for medication changes until the Carotid Doppler test results come back.

## 2019-11-18 NOTE — Telephone Encounter (Signed)
Wife notified to atleast reduce 1 a day until we have test results

## 2019-11-18 NOTE — Telephone Encounter (Signed)
Gavin Pound notified and states she will discuss with Reita Cliche

## 2019-12-01 ENCOUNTER — Other Ambulatory Visit: Payer: Self-pay

## 2019-12-01 ENCOUNTER — Ambulatory Visit (HOSPITAL_COMMUNITY)
Admission: RE | Admit: 2019-12-01 | Discharge: 2019-12-01 | Disposition: A | Discharge status: Home / Self Care | Payer: No Typology Code available for payment source | Source: Ambulatory Visit | Attending: Cardiology | Admitting: Cardiology

## 2019-12-01 DIAGNOSIS — R0989 Other specified symptoms and signs involving the circulatory and respiratory systems: Secondary | ICD-10-CM | POA: Diagnosis present

## 2019-12-01 MED ORDER — MORPHINE SULFATE 15 MG PO TABS: 15.0000 mg | ORAL_TABLET | Freq: Two times a day (BID) | ORAL | 0 refills | Status: DC | PRN

## 2019-12-01 MED ORDER — MORPHINE SULFATE ER 100 MG PO TBCR: 200.0000 mg | EXTENDED_RELEASE_TABLET | Freq: Three times a day (TID) | ORAL | 0 refills | Status: DC

## 2019-12-09 ENCOUNTER — Telehealth: Payer: Self-pay

## 2019-12-09 NOTE — Telephone Encounter (Signed)
lvm for pt to call back about the US Carotid

## 2019-12-09 NOTE — Telephone Encounter (Signed)
-----   Message from [facility], [place] sent at 12/07/2019 10:44 PM EDT -----  ----- Message ----- From: Tresa Garter, MD Sent: 12/01/2019   5:16 PM EDT To: Scarlett Presto, CMA  Betty, Please inform the patient that his vascular ultrasound shows mild stenosis of his carotid arteries on both sides.  No change in therapy. Thanks, AP

## 2019-12-12 NOTE — Telephone Encounter (Signed)
New message    Calling back for test results.  

## 2019-12-13 NOTE — Telephone Encounter (Signed)
Spoke to [name] [name] and given results. Spouse stated understanding.

## 2019-12-27 ENCOUNTER — Other Ambulatory Visit: Payer: Self-pay | Admitting: Endocrinology

## 2020-01-03 ENCOUNTER — Other Ambulatory Visit: Payer: Self-pay

## 2020-01-03 NOTE — Telephone Encounter (Signed)
Please send to new pharmacy

## 2020-01-03 NOTE — Telephone Encounter (Signed)
1.Medication Requested:morphine (MS CONTIN) 100 MG 12 hr tablet morphine (MS CONTIN) 100 MG 12 hr tablet morphine (MS CONTIN) 100 MG 12 hr tablet morphine (MSIR) 15 MG tablet morphine (MSIR) 15 MG tablet morphine (MSIR) 15 MG tablet  2. Pharmacy (Name, Street, City):[facility] [phone] - [place], [place] - [address]  3. On Med List: Yes   4. Last Visit with PCP: 3.2.2021   5. Next visit date with PCP: 6.2.2021    Agent: Please be advised that RX refills may take up to 3 business days. We ask that you follow-up with your pharmacy.

## 2020-01-04 MED ORDER — MORPHINE SULFATE 15 MG PO TABS: 15.0000 mg | ORAL_TABLET | Freq: Two times a day (BID) | ORAL | 0 refills | Status: DC | PRN

## 2020-01-04 MED ORDER — MORPHINE SULFATE ER 100 MG PO TBCR: 200.0000 mg | EXTENDED_RELEASE_TABLET | Freq: Three times a day (TID) | ORAL | 0 refills | Status: DC

## 2020-01-09 ENCOUNTER — Other Ambulatory Visit: Payer: Self-pay

## 2020-01-11 ENCOUNTER — Encounter: Payer: Self-pay | Admitting: Endocrinology

## 2020-01-11 ENCOUNTER — Ambulatory Visit: Payer: No Typology Code available for payment source | Admitting: Endocrinology

## 2020-01-11 ENCOUNTER — Other Ambulatory Visit: Payer: Self-pay

## 2020-01-11 VITALS — BP 134/70 | HR 88 | Ht 70.0 in | Wt 244.0 lb

## 2020-01-11 DIAGNOSIS — E1129 Type 2 diabetes mellitus with other diabetic kidney complication: Secondary | ICD-10-CM

## 2020-01-11 DIAGNOSIS — E1165 Type 2 diabetes mellitus with hyperglycemia: Secondary | ICD-10-CM

## 2020-01-11 DIAGNOSIS — IMO0002 Reserved for concepts with insufficient information to code with codable children: Secondary | ICD-10-CM

## 2020-01-11 LAB — POCT GLYCOSYLATED HEMOGLOBIN (HGB A1C): Hemoglobin A1C: 8.6 % — AB (ref 4.0–5.6)

## 2020-01-11 MED ORDER — RYBELSUS 14 MG PO TABS: 14.0000 mg | ORAL_TABLET | Freq: Every day | ORAL | 3 refills | Status: DC

## 2020-01-11 NOTE — Patient Instructions (Addendum)
I have sent a prescription to your pharmacy, to increase the Rybelsus, and: Please continue the same other diabetes medications check your blood sugar twice a day.  vary the time of day when you check, between before the 3 meals, and at bedtime.  also check if you have symptoms of your blood sugar being too high or too low.  please keep a record of the readings and bring it to your next appointment here.  You can write it on any piece of paper.  please call us sooner if your blood sugar goes below 70, or if you have a lot of readings over 200.   On this type of insulin schedule, you should eat meals on a regular schedule (especially lunch).  If a meal is missed or significantly delayed, your blood sugar could go low.  Please come back for a follow-up appointment in 2 months.

## 2020-01-11 NOTE — Progress Notes (Signed)
Subjective:    Patient ID: [name] [name], male    DOB: 07-16-1966, 54 y.o.   MRN: [id number]  HPI Pt returns for f/u of diabetes mellitus:  DM type: Insulin-requiring type 2 Dx'ed: 4650 Complications: CAD Therapy: insulin since 2007, and 2 oral meds DKA: never Severe hypoglycemia: never.  Pancreatitis: never.   Other: he takes QD insulin, due to h/o noncompliance; he works M-F, as a Development worker, community; He says cbg's on work days=days off. Interval history: no cbg record, but states cbg varies from 80-100's.  It is in general lowest in the afternoon.  He says he never misses meds.  Past Medical History:  Diagnosis Date  . COPD (chronic obstructive pulmonary disease) ([place])   . Diabetes mellitus   . GERD (gastroesophageal reflux disease)   . Hyperlipidemia   . Hypertension   . Lower back pain   . Nephrolithiasis    Dr Jeffie Pollock    Past Surgical History:  Procedure Laterality Date  . KNEE SURGERY      Social History   Socioeconomic History  . Marital status: Married    Spouse name: Not on file  . Number of children: 2  . Years of education: Not on file  . Highest education level: Not on file  Occupational History  . Not on file  Tobacco Use  . Smoking status: Current Every Day Smoker    Packs/day: 2.00    Types: Cigarettes  . Smokeless tobacco: Never Used  Substance and Sexual Activity  . Alcohol use: No  . Drug use: No  . Sexual activity: Yes  Other Topics Concern  . Not on file  Social History Narrative   No regular exercise   Social Determinants of Health   Financial Resource Strain:   . Difficulty of Paying Living Expenses:   Food Insecurity:   . Worried About Charity fundraiser in the Last Year:   . Arboriculturist in the Last Year:   Transportation Needs:   . Film/video editor (Medical):   Marland Kitchen Lack of Transportation (Non-Medical):   Physical Activity:   . Days of Exercise per Week:   . Minutes of Exercise per Session:   Stress:   . Feeling of Stress  :   Social Connections:   . Frequency of Communication with Friends and Family:   . Frequency of Social Gatherings with Friends and Family:   . Attends Religious Services:   . Active Member of Clubs or Organizations:   . Attends Archivist Meetings:   Marland Kitchen Marital Status:   Intimate Partner Violence:   . Fear of Current or Ex-Partner:   . Emotionally Abused:   Marland Kitchen Physically Abused:   . Sexually Abused:     Current Outpatient Medications on File Prior to Visit  Medication Sig Dispense Refill  . amLODipine-valsartan (EXFORGE) 10-320 MG tablet TAKE 1 TABLET BY MOUTH DAILY. 90 tablet 3  . ANORO ELLIPTA 62.5-25 MCG/INH AEPB INHALE 1 PUFF INTO THE LUNGS EVERY DAY 180 each 3  . aspirin EC 81 MG tablet Take 81 mg by mouth daily.    . B-D ULTRAFINE III SHORT PEN 31G X 8 MM MISC USE AS DIRECTED THREE TIMES DAILY 100 each 0  . Cholecalciferol (VITAMIN D3) 1000 UNITS CAPS Take 1 capsule by mouth daily.      . fluticasone (FLONASE) 50 MCG/ACT nasal spray SPRAY 2 SPRAYS INTO EACH NOSTRIL EVERY DAY 16 mL 5  . glucose blood (ONETOUCH ULTRA) test strip  1 each by Other route 2 (two) times daily. And lancets 2/day 180 strip 3  . insulin glargine, 2 Unit Dial, (TOUJEO MAX SOLOSTAR) 300 UNIT/ML Solostar Pen Inject 70 Units into the skin every morning. And pen needles 1/day. 6 pen 3  . ketorolac (TORADOL) 10 MG tablet Take 1 tablet (10 mg total) by mouth every 8 (eight) hours as needed for severe pain (renal colic). 20 tablet 0  . lovastatin (MEVACOR) 20 MG tablet TAKE 1 TABLET BY MOUTH EVERY DAY 90 tablet 3  . metFORMIN (GLUCOPHAGE-XR) 500 MG 24 hr tablet Take 1 tablet (500 mg total) by mouth daily with breakfast. 90 tablet 3  . morphine (MS CONTIN) 100 MG 12 hr tablet Take 2 tablets (200 mg total) by mouth 3 (three) times daily. 180 tablet 0  . morphine (MS CONTIN) 100 MG 12 hr tablet Take 2 tablets (200 mg total) by mouth 3 (three) times daily. 180 tablet 0  . morphine (MS CONTIN) 100 MG 12 hr  tablet Take 2 tablets (200 mg total) by mouth 3 (three) times daily. 180 tablet 0  . morphine (MSIR) 15 MG tablet Take 1 tablet (15 mg total) by mouth 2 (two) times daily as needed for severe pain. 60 tablet 0  . morphine (MSIR) 15 MG tablet Take 1 tablet (15 mg total) by mouth 2 (two) times daily as needed for severe pain. 60 tablet 0  . morphine (MSIR) 15 MG tablet Take 1 tablet (15 mg total) by mouth 2 (two) times daily as needed for severe pain. 60 tablet 0  . OneTouch Delica Lancets 33G MISC USE TO CHECK BLOOD SUGAR TWICE DAILY 100 each 3   No current facility-administered medications on file prior to visit.    Allergies  Allergen Reactions  . Belviq [Lorcaserin Hcl]     wierd  . Benazepril Hcl     REACTION: cough  . Nsaids     Can take short term Upset stomach    Family History  Problem Relation Age of Onset  . Hypertension Mother   . Diabetes Mother   . CAD Mother   . Heart disease Other        CAD male relative <60  . Vision loss Sister     BP 134/70   Pulse 88   Ht 5\' 10"  (1.778 m)   Wt 244 lb (110.7 kg)   SpO2 95%   BMI 35.01 kg/m    Review of Systems He denies hypoglycemia and nausea.      Objective:   Physical Exam VITAL SIGNS:  See vs page GENERAL: no distress Pulses: dorsalis pedis intact bilat.   MSK: no deformity of the feet CV: no leg edema Skin:  no ulcer on the feet.  normal color and temp on the feet. Neuro: sensation is intact to touch on the feet.     Lab Results  Component Value Date   HGBA1C 8.6 (A) 01/11/2020   Lab Results  Component Value Date   CREATININE 0.79 10/28/2018   BUN 11 10/28/2018   NA 133 (L) 10/28/2018   K 4.0 10/28/2018   CL 97 10/28/2018   CO2 29 10/28/2018      Assessment & Plan:  Insulin-requiring type 2 DM, with CAD: worse.   Patient Instructions  I have sent a prescription to your pharmacy, to increase the Rybelsus, and: Please continue the same other diabetes medications check your blood sugar  twice a day.  vary the time of day when  you check, between before the 3 meals, and at bedtime.  also check if you have symptoms of your blood sugar being too high or too low.  please keep a record of the readings and bring it to your next appointment here.  You can write it on any piece of paper.  please call us sooner if your blood sugar goes below 70, or if you have a lot of readings over 200.   On this type of insulin schedule, you should eat meals on a regular schedule (especially lunch).  If a meal is missed or significantly delayed, your blood sugar could go low.  Please come back for a follow-up appointment in 2 months.

## 2020-01-12 ENCOUNTER — Telehealth: Payer: Self-pay | Admitting: Endocrinology

## 2020-01-12 NOTE — Telephone Encounter (Signed)
Today's Visit  Patients wife Gavin Pound requests to be called at ph# [phone] re: Patient's After Visit Summary dated 01/11/20 states renal complications which patient nor Gavin Pound have ever been made aware of and Gavin Pound does not know what this means and has questions about it. AVS states: "You saw Romero Belling, MD on Wednesday Jan 11, 2020. The following issue was addressed: DM (diabetes mellitus), type 2, uncontrolled, with renal complications (HCC)."

## 2020-01-13 NOTE — Telephone Encounter (Signed)
Left a detailed message informing that the error has been corrected on AVS. Also advised in message, IF active in My Chart, correction can be viewed there as well. Advised to call if they have further concerns

## 2020-01-13 NOTE — Telephone Encounter (Signed)
That is not correct.  I have deleted it, thanks

## 2020-01-13 NOTE — Telephone Encounter (Signed)
Please review and advise.

## 2020-02-01 ENCOUNTER — Encounter: Payer: Self-pay | Admitting: Internal Medicine

## 2020-02-01 ENCOUNTER — Other Ambulatory Visit: Payer: Self-pay

## 2020-02-01 ENCOUNTER — Ambulatory Visit: Payer: No Typology Code available for payment source | Admitting: Internal Medicine

## 2020-02-01 DIAGNOSIS — M79605 Pain in left leg: Secondary | ICD-10-CM

## 2020-02-01 DIAGNOSIS — I10 Essential (primary) hypertension: Secondary | ICD-10-CM

## 2020-02-01 DIAGNOSIS — M545 Low back pain, unspecified: Secondary | ICD-10-CM

## 2020-02-01 DIAGNOSIS — E785 Hyperlipidemia, unspecified: Secondary | ICD-10-CM

## 2020-02-01 DIAGNOSIS — E1165 Type 2 diabetes mellitus with hyperglycemia: Secondary | ICD-10-CM

## 2020-02-01 DIAGNOSIS — Z6837 Body mass index (BMI) 37.0-37.9, adult: Secondary | ICD-10-CM

## 2020-02-01 MED ORDER — MORPHINE SULFATE ER 100 MG PO TBCR: 200.0000 mg | EXTENDED_RELEASE_TABLET | Freq: Three times a day (TID) | ORAL | 0 refills | Status: DC

## 2020-02-01 MED ORDER — MORPHINE SULFATE 15 MG PO TABS: 15.0000 mg | ORAL_TABLET | Freq: Two times a day (BID) | ORAL | 0 refills | Status: DC | PRN

## 2020-02-01 MED ORDER — FLUTICASONE PROPIONATE 50 MCG/ACT NA SUSP: NASAL | 5 refills | Status: DC

## 2020-02-01 NOTE — Patient Instructions (Signed)

## 2020-02-01 NOTE — Assessment & Plan Note (Signed)
Humalog, Basaglar, Rybelsus 

## 2020-02-01 NOTE — Progress Notes (Signed)
Subjective:  Patient ID: [name] [name], male    DOB: 1965-10-27  Age: 54 y.o. MRN: [id number]  CC: No chief complaint on file.   HPI [name] [name] presents for LBP, DM, HTN  Outpatient Medications Prior to Visit  Medication Sig Dispense Refill  . amLODipine-valsartan (EXFORGE) 10-320 MG tablet TAKE 1 TABLET BY MOUTH DAILY. 90 tablet 3  . ANORO ELLIPTA 62.5-25 MCG/INH AEPB INHALE 1 PUFF INTO THE LUNGS EVERY DAY 180 each 3  . aspirin EC 81 MG tablet Take 81 mg by mouth daily.    . B-D ULTRAFINE III SHORT PEN 31G X 8 MM MISC USE AS DIRECTED THREE TIMES DAILY 100 each 0  . Cholecalciferol (VITAMIN D3) 1000 UNITS CAPS Take 1 capsule by mouth daily.      Marland Kitchen glucose blood (ONETOUCH ULTRA) test strip 1 each by Other route 2 (two) times daily. And lancets 2/day 180 strip 3  . insulin glargine, 2 Unit Dial, (TOUJEO MAX SOLOSTAR) 300 UNIT/ML Solostar Pen Inject 70 Units into the skin every morning. And pen needles 1/day. 6 pen 3  . ketorolac (TORADOL) 10 MG tablet Take 1 tablet (10 mg total) by mouth every 8 (eight) hours as needed for severe pain (renal colic). 20 tablet 0  . lovastatin (MEVACOR) 20 MG tablet TAKE 1 TABLET BY MOUTH EVERY DAY 90 tablet 3  . metFORMIN (GLUCOPHAGE-XR) 500 MG 24 hr tablet Take 1 tablet (500 mg total) by mouth daily with breakfast. 90 tablet 3  . morphine (MSIR) 15 MG tablet Take 1 tablet (15 mg total) by mouth 2 (two) times daily as needed for severe pain. 60 tablet 0  . morphine (MSIR) 15 MG tablet Take 1 tablet (15 mg total) by mouth 2 (two) times daily as needed for severe pain. 60 tablet 0  . morphine (MSIR) 15 MG tablet Take 1 tablet (15 mg total) by mouth 2 (two) times daily as needed for severe pain. 60 tablet 0  . OneTouch Delica Lancets 33G MISC USE TO CHECK BLOOD SUGAR TWICE DAILY 100 each 3  . Semaglutide (RYBELSUS) 14 MG TABS Take 14 mg by mouth daily. 90 tablet 3  . fluticasone (FLONASE) 50 MCG/ACT nasal spray SPRAY 2 SPRAYS INTO EACH NOSTRIL EVERY  DAY 16 mL 5  . morphine (MS CONTIN) 100 MG 12 hr tablet Take 2 tablets (200 mg total) by mouth 3 (three) times daily. 180 tablet 0  . morphine (MS CONTIN) 100 MG 12 hr tablet Take 2 tablets (200 mg total) by mouth 3 (three) times daily. 180 tablet 0  . morphine (MS CONTIN) 100 MG 12 hr tablet Take 2 tablets (200 mg total) by mouth 3 (three) times daily. 180 tablet 0   No facility-administered medications prior to visit.    ROS: Review of Systems  Constitutional: Negative for appetite change, fatigue and unexpected weight change.  HENT: Negative for congestion, nosebleeds, sneezing, sore throat and trouble swallowing.   Eyes: Negative for itching and visual disturbance.  Respiratory: Negative for cough.   Cardiovascular: Negative for chest pain, palpitations and leg swelling.  Gastrointestinal: Negative for abdominal distention, blood in stool, diarrhea and nausea.  Genitourinary: Negative for frequency and hematuria.  Musculoskeletal: Positive for back pain. Negative for gait problem, joint swelling and neck pain.  Skin: Negative for rash.  Neurological: Negative for dizziness, tremors, speech difficulty and weakness.  Psychiatric/Behavioral: Negative for agitation, dysphoric mood, sleep disturbance and suicidal ideas. The patient is not nervous/anxious.  Objective:  BP 122/80 (BP Location: Left Arm, Patient Position: Sitting, Cuff Size: Large)   Pulse 67   Temp 98.3 F (36.8 C) (Oral)   Ht 5\' 9"  (1.753 m)   Wt 246 lb (111.6 kg)   SpO2 97%   BMI 36.33 kg/m   BP Readings from Last 3 Encounters:  02/01/20 122/80  01/11/20 134/70  11/10/19 114/60    Wt Readings from Last 3 Encounters:  02/01/20 246 lb (111.6 kg)  01/11/20 244 lb (110.7 kg)  11/10/19 243 lb 12.8 oz (110.6 kg)    Physical Exam Constitutional:      General: He is not in acute distress.    Appearance: He is well-developed.     Comments: NAD  Eyes:     Conjunctiva/sclera: Conjunctivae normal.      Pupils: Pupils are equal, round, and reactive to light.  Neck:     Thyroid: No thyromegaly.     Vascular: No JVD.  Cardiovascular:     Rate and Rhythm: Normal rate and regular rhythm.     Heart sounds: Normal heart sounds. No murmur. No friction rub. No gallop.   Pulmonary:     Effort: Pulmonary effort is normal. No respiratory distress.     Breath sounds: Normal breath sounds. No wheezing or rales.  Chest:     Chest wall: No tenderness.  Abdominal:     General: Bowel sounds are normal. There is no distension.     Palpations: Abdomen is soft. There is no mass.     Tenderness: There is abdominal tenderness. There is no guarding or rebound.  Musculoskeletal:        General: No tenderness. Normal range of motion.     Cervical back: Normal range of motion.  Lymphadenopathy:     Cervical: No cervical adenopathy.  Skin:    General: Skin is warm and dry.     Findings: No rash.  Neurological:     Mental Status: He is alert and oriented to person, place, and time.     Cranial Nerves: No cranial nerve deficit.     Motor: No abnormal muscle tone.     Coordination: Coordination normal.     Gait: Gait normal.     Deep Tendon Reflexes: Reflexes are normal and symmetric.  Psychiatric:        Behavior: Behavior normal.        Thought Content: Thought content normal.        Judgment: Judgment normal.    LS back pain is tender Lab Results  Component Value Date   WBC 10.3 04/23/2018   HGB 15.0 04/23/2018   HCT 44.0 04/23/2018   PLT 206.0 04/23/2018   GLUCOSE 231 (H) 10/28/2018   CHOL 127 04/23/2018   TRIG 86.0 04/23/2018   HDL 44.70 04/23/2018   LDLDIRECT 116.6 05/20/2007   LDLCALC 66 04/23/2018   ALT 33 04/23/2018   AST 24 04/23/2018   NA 133 (L) 10/28/2018   K 4.0 10/28/2018   CL 97 10/28/2018   CREATININE 0.79 10/28/2018   BUN 11 10/28/2018   CO2 29 10/28/2018   TSH 2.64 04/23/2018   PSA 0.20 04/23/2018   INR 1.1 05/10/2008   HGBA1C 8.6 (A) 01/11/2020   MICROALBUR 1.7  04/23/2018    VAS US CAROTID  Result Date: 12/01/2019 Carotid Arterial Duplex Study Indications:  Mild bilateral bruits. Risk Factors: Hypertension, hyperlipidemia, Diabetes, current smoker, coronary  artery disease. Performing Technologist: Olegario Shearer RVT  Examination Guidelines: A complete evaluation includes B-mode imaging, spectral Doppler, color Doppler, and power Doppler as needed of all accessible portions of each vessel. Bilateral testing is considered an integral part of a complete examination. Limited examinations for reoccurring indications may be performed as noted.  Right Carotid Findings: +----------+--------+--------+--------+----------------------+--------+           PSV cm/sEDV cm/sStenosisPlaque Description    Comments +----------+--------+--------+--------+----------------------+--------+ CCA Prox  89      12                                             +----------+--------+--------+--------+----------------------+--------+ CCA Distal64      16                                             +----------+--------+--------+--------+----------------------+--------+ ICA Prox  52      14              focal and heterogenous         +----------+--------+--------+--------+----------------------+--------+ ICA Mid   55      22      1-39%                                  +----------+--------+--------+--------+----------------------+--------+ ICA Distal97      27                                    tortuous +----------+--------+--------+--------+----------------------+--------+ ECA       95      17                                             +----------+--------+--------+--------+----------------------+--------+ +----------+--------+-------+----------------+-------------------+           PSV cm/sEDV cmsDescribe        Arm Pressure (mmHG) +----------+--------+-------+----------------+-------------------+ Subclavian119             Multiphasic, [id number]                 +----------+--------+-------+----------------+-------------------+ +---------+--------+--+--------+--+---------+ VertebralPSV cm/s44EDV cm/s10Antegrade +---------+--------+--+--------+--+---------+  Left Carotid Findings: +----------+--------+--------+--------+----------------------+--------+           PSV cm/sEDV cm/sStenosisPlaque Description    Comments +----------+--------+--------+--------+----------------------+--------+ CCA Prox  94      18                                             +----------+--------+--------+--------+----------------------+--------+ CCA Distal58      18                                             +----------+--------+--------+--------+----------------------+--------+ ICA Prox  73      22      1-39%   focal and heterogenous         +----------+--------+--------+--------+----------------------+--------+ ICA  Mid   70      26                                             +----------+--------+--------+--------+----------------------+--------+ ICA Distal101     28                                             +----------+--------+--------+--------+----------------------+--------+ ECA       110     21                                             +----------+--------+--------+--------+----------------------+--------+ +----------+--------+--------+----------------+-------------------+           PSV cm/sEDV cm/sDescribe        Arm Pressure (mmHG) +----------+--------+--------+----------------+-------------------+ [id number]             Multiphasic, VOJ500                 +----------+--------+--------+----------------+-------------------+ +---------+--------+--+--------+--+---------+ VertebralPSV cm/s47EDV cm/s14Antegrade +---------+--------+--+--------+--+---------+   Summary: Right Carotid: Velocities in the right ICA are consistent with a 1-39% stenosis. Left Carotid: Velocities in the  left ICA are consistent with a 1-39% stenosis. Vertebrals:  Bilateral vertebral arteries demonstrate antegrade flow. Subclavians: Normal flow hemodynamics were seen in bilateral subclavian              arteries. *See table(s) above for measurements and observations.  Electronically signed by Charlton Haws MD on 12/01/2019 at 5:00:21 PM.    Final     Assessment & Plan:   Diagnoses and all orders for this visit:  Uncontrolled type 2 diabetes mellitus with hyperglycemia (HCC)  Other orders -     fluticasone (FLONASE) 50 MCG/ACT nasal spray; SPRAY 2 SPRAYS INTO EACH NOSTRIL EVERY DAY -     morphine (MS CONTIN) 100 MG 12 hr tablet; Take 2 tablets (200 mg total) by mouth 3 (three) times daily. -     morphine (MS CONTIN) 100 MG 12 hr tablet; Take 2 tablets (200 mg total) by mouth 3 (three) times daily. -     morphine (MS CONTIN) 100 MG 12 hr tablet; Take 2 tablets (200 mg total) by mouth 3 (three) times daily. -     morphine (MSIR) 15 MG tablet; Take 1 tablet (15 mg total) by mouth 2 (two) times daily as needed for severe pain. -     morphine (MSIR) 15 MG tablet; Take 1 tablet (15 mg total) by mouth 2 (two) times daily as needed for severe pain. -     morphine (MSIR) 15 MG tablet; Take 1 tablet (15 mg total) by mouth 2 (two) times daily as needed for severe pain.     Meds ordered this encounter  Medications  . fluticasone (FLONASE) 50 MCG/ACT nasal spray    Sig: SPRAY 2 SPRAYS INTO EACH NOSTRIL EVERY DAY    Dispense:  16 mL    Refill:  5  . morphine (MS CONTIN) 100 MG 12 hr tablet    Sig: Take 2 tablets (200 mg total) by mouth 3 (three) times daily.    Dispense:  180 tablet    Refill:  0    Fill  on or after 04/02/20  . morphine (MS CONTIN) 100 MG 12 hr tablet    Sig: Take 2 tablets (200 mg total) by mouth 3 (three) times daily.    Dispense:  180 tablet    Refill:  0    Fill on or after 03/03/20. DX code:M54.5  . morphine (MS CONTIN) 100 MG 12 hr tablet    Sig: Take 2 tablets (200 mg total) by  mouth 3 (three) times daily.    Dispense:  180 tablet    Refill:  0    Fill on or after 02/03/20  . morphine (MSIR) 15 MG tablet    Sig: Take 1 tablet (15 mg total) by mouth 2 (two) times daily as needed for severe pain.    Dispense:  60 tablet    Refill:  0    Please fill on or after 04/02/20  . morphine (MSIR) 15 MG tablet    Sig: Take 1 tablet (15 mg total) by mouth 2 (two) times daily as needed for severe pain.    Dispense:  60 tablet    Refill:  0    Please fill on or after 03/03/20  . morphine (MSIR) 15 MG tablet    Sig: Take 1 tablet (15 mg total) by mouth 2 (two) times daily as needed for severe pain.    Dispense:  60 tablet    Refill:  0    Please fill on or after 02/03/20     Follow-up: No follow-ups on file.  Walker Kehr, MD

## 2020-02-04 ENCOUNTER — Encounter: Payer: Self-pay | Admitting: Internal Medicine

## 2020-02-04 NOTE — Assessment & Plan Note (Signed)
The patient has declined statins in the past

## 2020-02-04 NOTE — Assessment & Plan Note (Signed)
Chronic severe LBP, on opioids On MSIR, MSContin   Potential benefits of a long term opioids  use as well as potential risks  and complications were explained to the patient and were aknowledged.

## 2020-02-04 NOTE — Assessment & Plan Note (Signed)
Amlodipine-valsartan BP Readings from Last 3 Encounters:  02/01/20 122/80  01/11/20 134/70  11/10/19 114/60

## 2020-02-04 NOTE — Assessment & Plan Note (Signed)
Wt Readings from Last 3 Encounters:  02/01/20 246 lb (111.6 kg)  01/11/20 244 lb (110.7 kg)  11/10/19 243 lb 12.8 oz (110.6 kg)

## 2020-02-20 ENCOUNTER — Other Ambulatory Visit: Payer: Self-pay | Admitting: Internal Medicine

## 2020-03-08 ENCOUNTER — Telehealth: Payer: Self-pay

## 2020-03-08 NOTE — Telephone Encounter (Signed)
PA initiated for Morphine 100 mg Key: [id number] Patient ID: [id number] Plan number: [phone]

## 2020-03-23 ENCOUNTER — Other Ambulatory Visit: Payer: Self-pay | Admitting: Endocrinology

## 2020-03-23 ENCOUNTER — Other Ambulatory Visit: Payer: Self-pay | Admitting: Internal Medicine

## 2020-03-23 NOTE — Telephone Encounter (Signed)
Does pt need to schedule appt.

## 2020-03-23 NOTE — Telephone Encounter (Signed)
1.  Please schedule f/u appt 2.  Then please refill x 2 mos, pending that appt.  

## 2020-03-28 ENCOUNTER — Telehealth: Payer: Self-pay

## 2020-03-28 NOTE — Telephone Encounter (Signed)
PA for Morphine sulfate 100mg  180 tablets has been denied but has been approved for 60 tablets until 09/06/2020.

## 2020-04-04 ENCOUNTER — Other Ambulatory Visit: Payer: Self-pay

## 2020-04-04 MED ORDER — TOUJEO MAX SOLOSTAR 300 UNIT/ML ~~LOC~~ SOPN: 70.0000 [IU] | PEN_INJECTOR | SUBCUTANEOUS | 3 refills | Status: DC

## 2020-04-26 ENCOUNTER — Ambulatory Visit: Payer: No Typology Code available for payment source | Admitting: Internal Medicine

## 2020-04-26 ENCOUNTER — Other Ambulatory Visit: Payer: Self-pay

## 2020-04-26 ENCOUNTER — Encounter: Payer: Self-pay | Admitting: Internal Medicine

## 2020-04-26 DIAGNOSIS — I10 Essential (primary) hypertension: Secondary | ICD-10-CM

## 2020-04-26 DIAGNOSIS — I2583 Coronary atherosclerosis due to lipid rich plaque: Secondary | ICD-10-CM

## 2020-04-26 DIAGNOSIS — M79605 Pain in left leg: Secondary | ICD-10-CM

## 2020-04-26 DIAGNOSIS — M545 Low back pain: Secondary | ICD-10-CM

## 2020-04-26 DIAGNOSIS — I2510 Atherosclerotic heart disease of native coronary artery without angina pectoris: Secondary | ICD-10-CM

## 2020-04-26 DIAGNOSIS — M79604 Pain in right leg: Secondary | ICD-10-CM

## 2020-04-26 MED ORDER — MORPHINE SULFATE ER 100 MG PO TBCR: 200.0000 mg | EXTENDED_RELEASE_TABLET | Freq: Three times a day (TID) | ORAL | 0 refills | Status: DC

## 2020-04-26 MED ORDER — MORPHINE SULFATE 15 MG PO TABS: 15.0000 mg | ORAL_TABLET | Freq: Two times a day (BID) | ORAL | 0 refills | Status: DC | PRN

## 2020-04-26 MED ORDER — MORPHINE SULFATE 15 MG PO TABS
15.0000 mg | ORAL_TABLET | Freq: Two times a day (BID) | ORAL | 0 refills | Status: DC | PRN
Start: 2020-04-26 — End: 2020-06-29

## 2020-04-26 NOTE — Progress Notes (Signed)
Subjective:  Patient ID: [name] [name], male    DOB: November 03, 1965  Age: 54 y.o. MRN: [id number]  CC: No chief complaint on file.   HPI DOW BLAHNIK presents for LBP, DM, HTN  Outpatient Medications Prior to Visit  Medication Sig Dispense Refill  . amLODipine-valsartan (EXFORGE) 10-320 MG tablet TAKE 1 TABLET BY MOUTH EVERY DAY 90 tablet 3  . ANORO ELLIPTA 62.5-25 MCG/INH AEPB INHALE 1 PUFF INTO THE LUNGS EVERY DAY 180 each 3  . aspirin EC 81 MG tablet Take 81 mg by mouth daily.    . B-D ULTRAFINE III SHORT PEN 31G X 8 MM MISC USE AS DIRECTED THREE TIMES DAILY 100 each 0  . Cholecalciferol (VITAMIN D3) 1000 UNITS CAPS Take 1 capsule by mouth daily.      . fluticasone (FLONASE) 50 MCG/ACT nasal spray SPRAY 2 SPRAYS INTO EACH NOSTRIL EVERY DAY 48 mL 1  . glucose blood (ONETOUCH ULTRA) test strip 1 each by Other route 2 (two) times daily. And lancets 2/day 180 strip 3  . insulin glargine, 2 Unit Dial, (TOUJEO MAX SOLOSTAR) 300 UNIT/ML Solostar Pen Inject 70 Units into the skin every morning. And pen needles 1/day. 6 pen 3  . ketorolac (TORADOL) 10 MG tablet Take 1 tablet (10 mg total) by mouth every 8 (eight) hours as needed for severe pain (renal colic). 20 tablet 0  . lovastatin (MEVACOR) 20 MG tablet TAKE 1 TABLET BY MOUTH EVERY DAY 90 tablet 3  . metFORMIN (GLUCOPHAGE-XR) 500 MG 24 hr tablet Take 1 tablet (500 mg total) by mouth daily with breakfast. 90 tablet 3  . morphine (MSIR) 15 MG tablet Take 1 tablet (15 mg total) by mouth 2 (two) times daily as needed for severe pain. 60 tablet 0  . morphine (MSIR) 15 MG tablet Take 1 tablet (15 mg total) by mouth 2 (two) times daily as needed for severe pain. 60 tablet 0  . morphine (MSIR) 15 MG tablet Take 1 tablet (15 mg total) by mouth 2 (two) times daily as needed for severe pain. 60 tablet 0  . OneTouch Delica Lancets 33G MISC USE TO CHECK BLOOD SUGAR TWICE DAILY 100 each 3  . Semaglutide (RYBELSUS) 14 MG TABS Take 14 mg by mouth  daily. 90 tablet 3  . morphine (MS CONTIN) 100 MG 12 hr tablet Take 2 tablets (200 mg total) by mouth 3 (three) times daily. 180 tablet 0  . morphine (MS CONTIN) 100 MG 12 hr tablet Take 2 tablets (200 mg total) by mouth 3 (three) times daily. 180 tablet 0  . morphine (MS CONTIN) 100 MG 12 hr tablet Take 2 tablets (200 mg total) by mouth 3 (three) times daily. 180 tablet 0  . morphine (MSIR) 15 MG tablet Take 1 tablet (15 mg total) by mouth 2 (two) times daily as needed for severe pain. 60 tablet 0  . morphine (MSIR) 15 MG tablet Take 1 tablet (15 mg total) by mouth 2 (two) times daily as needed for severe pain. 60 tablet 0  . morphine (MSIR) 15 MG tablet Take 1 tablet (15 mg total) by mouth 2 (two) times daily as needed for severe pain. 60 tablet 0   No facility-administered medications prior to visit.    ROS: Review of Systems  Constitutional: Negative for appetite change, fatigue and unexpected weight change.  HENT: Negative for congestion, nosebleeds, sneezing, sore throat and trouble swallowing.   Eyes: Negative for itching and visual disturbance.  Respiratory: Negative for  cough.   Cardiovascular: Negative for chest pain, palpitations and leg swelling.  Gastrointestinal: Negative for abdominal distention, blood in stool, diarrhea and nausea.  Genitourinary: Negative for frequency and hematuria.  Musculoskeletal: Positive for back pain. Negative for gait problem, joint swelling and neck pain.  Skin: Negative for rash.  Neurological: Negative for dizziness, tremors, speech difficulty and weakness.  Psychiatric/Behavioral: Negative for agitation, dysphoric mood and sleep disturbance. The patient is not nervous/anxious.     Objective:  BP (!) 148/78 (BP Location: Right Arm, Patient Position: Sitting, Cuff Size: Large)   Pulse 62   Temp 98.6 F (37 C) (Oral)   Ht 5\' 9"  (1.753 m)   Wt 250 lb (113.4 kg)   SpO2 99%   BMI 36.92 kg/m   BP Readings from Last 3 Encounters:  04/26/20  (!) 148/78  02/01/20 122/80  01/11/20 134/70    Wt Readings from Last 3 Encounters:  04/26/20 250 lb (113.4 kg)  02/01/20 246 lb (111.6 kg)  01/11/20 244 lb (110.7 kg)    Physical Exam Constitutional:      General: He is not in acute distress.    Appearance: He is well-developed.     Comments: NAD  Eyes:     Conjunctiva/sclera: Conjunctivae normal.     Pupils: Pupils are equal, round, and reactive to light.  Neck:     Thyroid: No thyromegaly.     Vascular: No JVD.  Cardiovascular:     Rate and Rhythm: Normal rate and regular rhythm.     Heart sounds: Normal heart sounds. No murmur heard.  No friction rub. No gallop.   Pulmonary:     Effort: Pulmonary effort is normal. No respiratory distress.     Breath sounds: Normal breath sounds. No wheezing or rales.  Chest:     Chest wall: No tenderness.  Abdominal:     General: Bowel sounds are normal. There is no distension.     Palpations: Abdomen is soft. There is no mass.     Tenderness: There is no abdominal tenderness. There is no guarding or rebound.  Musculoskeletal:        General: Tenderness present. Normal range of motion.     Cervical back: Normal range of motion.  Lymphadenopathy:     Cervical: No cervical adenopathy.  Skin:    General: Skin is warm and dry.     Findings: No rash.  Neurological:     Mental Status: He is alert and oriented to person, place, and time.     Cranial Nerves: No cranial nerve deficit.     Motor: No abnormal muscle tone.     Coordination: Coordination normal.     Gait: Gait normal.     Deep Tendon Reflexes: Reflexes are normal and symmetric.  Psychiatric:        Behavior: Behavior normal.        Thought Content: Thought content normal.        Judgment: Judgment normal.   LS stiff  Lab Results  Component Value Date   WBC 10.3 04/23/2018   HGB 15.0 04/23/2018   HCT 44.0 04/23/2018   PLT 206.0 04/23/2018   GLUCOSE 231 (H) 10/28/2018   CHOL 127 04/23/2018   TRIG 86.0 04/23/2018     HDL 44.70 04/23/2018   LDLDIRECT 116.6 05/20/2007   LDLCALC 66 04/23/2018   ALT 33 04/23/2018   AST 24 04/23/2018   NA 133 (L) 10/28/2018   K 4.0 10/28/2018   CL 97 10/28/2018  CREATININE 0.79 10/28/2018   BUN 11 10/28/2018   CO2 29 10/28/2018   TSH 2.64 04/23/2018   PSA 0.20 04/23/2018   INR 1.1 05/10/2008   HGBA1C 8.6 (A) 01/11/2020   MICROALBUR 1.7 04/23/2018    VAS US CAROTID  Result Date: 12/01/2019 Carotid Arterial Duplex Study Indications:  Mild bilateral bruits. Risk Factors: Hypertension, hyperlipidemia, Diabetes, current smoker, coronary               artery disease. Performing Technologist: Olegario Sheareranielle Schmitt RVT  Examination Guidelines: A complete evaluation includes B-mode imaging, spectral Doppler, color Doppler, and power Doppler as needed of all accessible portions of each vessel. Bilateral testing is considered an integral part of a complete examination. Limited examinations for reoccurring indications may be performed as noted.  Right Carotid Findings: +----------+--------+--------+--------+----------------------+--------+           PSV cm/sEDV cm/sStenosisPlaque Description    Comments +----------+--------+--------+--------+----------------------+--------+ CCA Prox  89      12                                             +----------+--------+--------+--------+----------------------+--------+ CCA Distal64      16                                             +----------+--------+--------+--------+----------------------+--------+ ICA Prox  52      14              focal and heterogenous         +----------+--------+--------+--------+----------------------+--------+ ICA Mid   55      22      1-39%                                  +----------+--------+--------+--------+----------------------+--------+ ICA Distal97      27                                    tortuous +----------+--------+--------+--------+----------------------+--------+ ECA        95      17                                             +----------+--------+--------+--------+----------------------+--------+ +----------+--------+-------+----------------+-------------------+           PSV cm/sEDV cmsDescribe        Arm Pressure (mmHG) +----------+--------+-------+----------------+-------------------+ Subclavian119            Multiphasic, [id number]                 +----------+--------+-------+----------------+-------------------+ +---------+--------+--+--------+--+---------+ VertebralPSV cm/s44EDV cm/s10Antegrade +---------+--------+--+--------+--+---------+  Left Carotid Findings: +----------+--------+--------+--------+----------------------+--------+           PSV cm/sEDV cm/sStenosisPlaque Description    Comments +----------+--------+--------+--------+----------------------+--------+ CCA Prox  94      18                                             +----------+--------+--------+--------+----------------------+--------+ CCA Distal58  18                                             +----------+--------+--------+--------+----------------------+--------+ ICA Prox  73      22      1-39%   focal and heterogenous         +----------+--------+--------+--------+----------------------+--------+ ICA Mid   70      26                                             +----------+--------+--------+--------+----------------------+--------+ ICA Distal101     28                                             +----------+--------+--------+--------+----------------------+--------+ ECA       110     21                                             +----------+--------+--------+--------+----------------------+--------+ +----------+--------+--------+----------------+-------------------+           PSV cm/sEDV cm/sDescribe        Arm Pressure (mmHG) +----------+--------+--------+----------------+-------------------+ [id number]              Multiphasic, [id number]                 +----------+--------+--------+----------------+-------------------+ +---------+--------+--+--------+--+---------+ VertebralPSV cm/s47EDV cm/s14Antegrade +---------+--------+--+--------+--+---------+   Summary: Right Carotid: Velocities in the right ICA are consistent with a 1-39% stenosis. Left Carotid: Velocities in the left ICA are consistent with a 1-39% stenosis. Vertebrals:  Bilateral vertebral arteries demonstrate antegrade flow. Subclavians: Normal flow hemodynamics were seen in bilateral subclavian              arteries. *See table(s) above for measurements and observations.  Electronically signed by Charlton Haws MD on 12/01/2019 at 5:00:21 PM.    Final     Assessment & Plan:   There are no diagnoses linked to this encounter.   Meds ordered this encounter  Medications  . morphine (MS CONTIN) 100 MG 12 hr tablet    Sig: Take 2 tablets (200 mg total) by mouth 3 (three) times daily.    Dispense:  180 tablet    Refill:  0    Fill on or after 05/02/20  . morphine (MS CONTIN) 100 MG 12 hr tablet    Sig: Take 2 tablets (200 mg total) by mouth 3 (three) times daily.    Dispense:  180 tablet    Refill:  0    Fill on or after 06/01/20. DX code:M54.5  . morphine (MS CONTIN) 100 MG 12 hr tablet    Sig: Take 2 tablets (200 mg total) by mouth 3 (three) times daily.    Dispense:  180 tablet    Refill:  0    Fill on or after 07/01/20  . morphine (MSIR) 15 MG tablet    Sig: Take 1 tablet (15 mg total) by mouth 2 (two) times daily as needed for severe pain.    Dispense:  60 tablet    Refill:  0  Please fill on or after 05/02/20  . morphine (MSIR) 15 MG tablet    Sig: Take 1 tablet (15 mg total) by mouth 2 (two) times daily as needed for severe pain.    Dispense:  60 tablet    Refill:  0    Please fill on or after 06/01/20  . morphine (MSIR) 15 MG tablet    Sig: Take 1 tablet (15 mg total) by mouth 2 (two) times daily as needed for severe pain.     Dispense:  60 tablet    Refill:  0    Please fill on or after 07/01/20     Follow-up: No follow-ups on file.  Sonda Primes, MD

## 2020-05-01 NOTE — Assessment & Plan Note (Signed)
No CP 

## 2020-05-01 NOTE — Assessment & Plan Note (Signed)
°  BP Readings from Last 3 Encounters:  04/26/20 (!) 148/78  02/01/20 122/80  01/11/20 134/70

## 2020-05-01 NOTE — Assessment & Plan Note (Signed)
On MSIR, MSContin   Potential benefits of a long term opioids  use as well as potential risks  and complications were explained to the patient and were aknowledged. 

## 2020-05-02 ENCOUNTER — Other Ambulatory Visit: Payer: Self-pay | Admitting: Internal Medicine

## 2020-05-03 ENCOUNTER — Ambulatory Visit: Payer: No Typology Code available for payment source | Admitting: Internal Medicine

## 2020-05-09 ENCOUNTER — Ambulatory Visit (INDEPENDENT_AMBULATORY_CARE_PROVIDER_SITE_OTHER): Payer: No Typology Code available for payment source | Admitting: Endocrinology

## 2020-05-09 ENCOUNTER — Other Ambulatory Visit: Payer: Self-pay

## 2020-05-09 ENCOUNTER — Encounter: Payer: Self-pay | Admitting: Endocrinology

## 2020-05-09 VITALS — BP 132/80 | HR 95 | Ht 69.0 in | Wt 250.8 lb

## 2020-05-09 DIAGNOSIS — E1165 Type 2 diabetes mellitus with hyperglycemia: Secondary | ICD-10-CM

## 2020-05-09 LAB — POCT GLYCOSYLATED HEMOGLOBIN (HGB A1C): Hemoglobin A1C: 7.9 % — AB (ref 4.0–5.6)

## 2020-05-09 MED ORDER — ACARBOSE 25 MG PO TABS: 25.0000 mg | ORAL_TABLET | Freq: Three times a day (TID) | ORAL | 11 refills | Status: DC

## 2020-05-09 NOTE — Patient Instructions (Addendum)
I have sent a prescription to your pharmacy, to add "acarbose," and: Please continue the same other diabetes medications.   If the constipation persists, we can increase the acarbose, or reduce the Rybelsus.  check your blood sugar twice a day.  vary the time of day when you check, between before the 3 meals, and at bedtime.  also check if you have symptoms of your blood sugar being too high or too low.  please keep a record of the readings and bring it to your next appointment here.  You can write it on any piece of paper.  please call us sooner if your blood sugar goes below 70, or if you have a lot of readings over 200.   On this type of insulin schedule, you should eat meals on a regular schedule (especially lunch).  If a meal is missed or significantly delayed, your blood sugar could go low.  Please come back for a follow-up appointment in 3 months.

## 2020-05-09 NOTE — Progress Notes (Signed)
Subjective:    Patient ID: [name] [name], male    DOB: 03-27-1966, 54 y.o.   MRN: [id number]  HPI Pt returns for f/u of diabetes mellitus:  DM type: Insulin-requiring type 2 Dx'ed: 2005 Complications: CAD Therapy: insulin since 2007, and 2 oral meds.   DKA: never Severe hypoglycemia: never.  Pancreatitis: never.   Other: he takes QD insulin, due to h/o noncompliance; he works M-F, as a Nutritional therapist; He says cbg's on work days=days off.  Interval history: no cbg record, but states cbg's are well-controlled, but he does not recall specifics.  It is in general lowest in the afternoon.  He says he never misses meds.  Pt says increased rybelsus dosage causes constipation.   Past Medical History:  Diagnosis Date  . COPD (chronic obstructive pulmonary disease) (HCC)   . Diabetes mellitus   . GERD (gastroesophageal reflux disease)   . Hyperlipidemia   . Hypertension   . Lower back pain   . Nephrolithiasis    Dr Annabell Howells    Past Surgical History:  Procedure Laterality Date  . KNEE SURGERY      Social History   Socioeconomic History  . Marital status: Married    Spouse name: Not on file  . Number of children: 2  . Years of education: Not on file  . Highest education level: Not on file  Occupational History  . Not on file  Tobacco Use  . Smoking status: Current Every Day Smoker    Packs/day: 2.00    Types: Cigarettes  . Smokeless tobacco: Never Used  Substance and Sexual Activity  . Alcohol use: No  . Drug use: No  . Sexual activity: Yes  Other Topics Concern  . Not on file  Social History Narrative   No regular exercise   Social Determinants of Health   Financial Resource Strain:   . Difficulty of Paying Living Expenses: Not on file  Food Insecurity:   . Worried About Programme researcher, broadcasting/film/video in the Last Year: Not on file  . Ran Out of Food in the Last Year: Not on file  Transportation Needs:   . Lack of Transportation (Medical): Not on file  . Lack of Transportation  (Non-Medical): Not on file  Physical Activity:   . Days of Exercise per Week: Not on file  . Minutes of Exercise per Session: Not on file  Stress:   . Feeling of Stress : Not on file  Social Connections:   . Frequency of Communication with Friends and Family: Not on file  . Frequency of Social Gatherings with Friends and Family: Not on file  . Attends Religious Services: Not on file  . Active Member of Clubs or Organizations: Not on file  . Attends Banker Meetings: Not on file  . Marital Status: Not on file  Intimate Partner Violence:   . Fear of Current or Ex-Partner: Not on file  . Emotionally Abused: Not on file  . Physically Abused: Not on file  . Sexually Abused: Not on file    Current Outpatient Medications on File Prior to Visit  Medication Sig Dispense Refill  . amLODipine-valsartan (EXFORGE) 10-320 MG tablet TAKE 1 TABLET BY MOUTH EVERY DAY 90 tablet 3  . ANORO ELLIPTA 62.5-25 MCG/INH AEPB INHALE 1 PUFF INTO THE LUNGS EVERY DAY 180 each 3  . aspirin EC 81 MG tablet Take 81 mg by mouth daily.    . B-D ULTRAFINE III SHORT PEN 31G X 8 MM MISC  USE AS DIRECTED THREE TIMES DAILY 100 each 0  . Cholecalciferol (VITAMIN D3) 1000 UNITS CAPS Take 1 capsule by mouth daily.      . fluticasone (FLONASE) 50 MCG/ACT nasal spray SPRAY 2 SPRAYS INTO EACH NOSTRIL EVERY DAY 48 mL 1  . glucose blood (ONETOUCH ULTRA) test strip 1 each by Other route 2 (two) times daily. And lancets 2/day 180 strip 3  . insulin glargine, 2 Unit Dial, (TOUJEO MAX SOLOSTAR) 300 UNIT/ML Solostar Pen Inject 70 Units into the skin every morning. And pen needles 1/day. 6 pen 3  . ketorolac (TORADOL) 10 MG tablet Take 1 tablet (10 mg total) by mouth every 8 (eight) hours as needed for severe pain (renal colic). 20 tablet 0  . lovastatin (MEVACOR) 20 MG tablet TAKE 1 TABLET BY MOUTH EVERY DAY 90 tablet 3  . metFORMIN (GLUCOPHAGE-XR) 500 MG 24 hr tablet Take 1 tablet (500 mg total) by mouth daily with  breakfast. 90 tablet 3  . morphine (MS CONTIN) 100 MG 12 hr tablet Take 2 tablets (200 mg total) by mouth 3 (three) times daily. 180 tablet 0  . morphine (MS CONTIN) 100 MG 12 hr tablet Take 2 tablets (200 mg total) by mouth 3 (three) times daily. 180 tablet 0  . morphine (MS CONTIN) 100 MG 12 hr tablet Take 2 tablets (200 mg total) by mouth 3 (three) times daily. 180 tablet 0  . morphine (MSIR) 15 MG tablet Take 1 tablet (15 mg total) by mouth 2 (two) times daily as needed for severe pain. 60 tablet 0  . morphine (MSIR) 15 MG tablet Take 1 tablet (15 mg total) by mouth 2 (two) times daily as needed for severe pain. 60 tablet 0  . morphine (MSIR) 15 MG tablet Take 1 tablet (15 mg total) by mouth 2 (two) times daily as needed for severe pain. 60 tablet 0  . morphine (MSIR) 15 MG tablet Take 1 tablet (15 mg total) by mouth 2 (two) times daily as needed for severe pain. 60 tablet 0  . morphine (MSIR) 15 MG tablet Take 1 tablet (15 mg total) by mouth 2 (two) times daily as needed for severe pain. 60 tablet 0  . morphine (MSIR) 15 MG tablet Take 1 tablet (15 mg total) by mouth 2 (two) times daily as needed for severe pain. 60 tablet 0  . OneTouch Delica Lancets 33G MISC USE TO CHECK BLOOD SUGAR TWICE DAILY 100 each 3  . Semaglutide (RYBELSUS) 14 MG TABS Take 14 mg by mouth daily. 90 tablet 3   No current facility-administered medications on file prior to visit.    Allergies  Allergen Reactions  . Belviq [Lorcaserin Hcl]     wierd  . Benazepril Hcl     REACTION: cough  . Nsaids     Can take short term Upset stomach    Family History  Problem Relation Age of Onset  . Hypertension Mother   . Diabetes Mother   . CAD Mother   . Heart disease Other        CAD male relative <60  . Vision loss Sister     BP 132/80   Pulse 95   Ht 5\' 9"  (1.753 m)   Wt 250 lb 12.8 oz (113.8 kg)   SpO2 94%   BMI 37.04 kg/m    Review of Systems He denies hypoglycemia.      Objective:   Physical  Exam VITAL SIGNS:  See vs page GENERAL: no distress  Pulses: dorsalis pedis intact bilat.   MSK: no deformity of the feet CV: 2+ bilat leg edema Skin:  no ulcer on the feet.  normal color and temp on the feet.  Neuro: sensation is intact to touch on the feet.     Lab Results  Component Value Date   HGBA1C 7.9 (A) 05/09/2020      Assessment & Plan:  Type 2 DM: uncontrolled Constipation: Side effect of Rybelsus + MSIR  Patient Instructions  I have sent a prescription to your pharmacy, to add "acarbose," and: Please continue the same other diabetes medications.   If the constipation persists, we can increase the acarbose, or reduce the Rybelsus.  check your blood sugar twice a day.  vary the time of day when you check, between before the 3 meals, and at bedtime.  also check if you have symptoms of your blood sugar being too high or too low.  please keep a record of the readings and bring it to your next appointment here.  You can write it on any piece of paper.  please call us sooner if your blood sugar goes below 70, or if you have a lot of readings over 200.   On this type of insulin schedule, you should eat meals on a regular schedule (especially lunch).  If a meal is missed or significantly delayed, your blood sugar could go low.  Please come back for a follow-up appointment in 3 months.

## 2020-06-27 ENCOUNTER — Telehealth: Payer: Self-pay | Admitting: Internal Medicine

## 2020-06-27 DIAGNOSIS — M545 Low back pain, unspecified: Secondary | ICD-10-CM

## 2020-06-27 DIAGNOSIS — M79605 Pain in left leg: Secondary | ICD-10-CM

## 2020-06-27 NOTE — Telephone Encounter (Signed)
Paper prescriptions for these medications dated back to 04/26/20 She said they are supposed to be sent electronically and needs clairification April from [facility] in [place] #[phone] Extension 2  morphine (MSIR) 15 MG tablet morphine (MS CONTIN) 100 MG 12 hr tablet

## 2020-06-29 NOTE — Telephone Encounter (Signed)
Called April w/[facility] she states pt ihas change pharmacy to them. They are no longer using [facility].  The pt brought in a paper rx for his pain med, but they can not take paper rx she states they must be sent electronically. Requesting rx for Nov 1st to be sent electronically.Marland KitchenRaechel Chute

## 2020-07-03 MED ORDER — MORPHINE SULFATE ER 100 MG PO TBCR
200.0000 mg | EXTENDED_RELEASE_TABLET | Freq: Three times a day (TID) | ORAL | 0 refills | Status: DC
Start: 2020-07-03 — End: 2020-07-31

## 2020-07-03 MED ORDER — MORPHINE SULFATE 15 MG PO TABS
15.0000 mg | ORAL_TABLET | Freq: Two times a day (BID) | ORAL | 0 refills | Status: DC | PRN
Start: 2020-07-03 — End: 2020-07-31

## 2020-07-03 NOTE — Telephone Encounter (Signed)
Ok Done Thx 

## 2020-07-04 ENCOUNTER — Telehealth: Payer: Self-pay | Admitting: *Deleted

## 2020-07-04 NOTE — Telephone Encounter (Signed)
Rec'd PA for Morphine Sulfate ER 100 mg. Completed via cover-my-meds w/ [facility] - Rx #: [id number]. Rec'd msg Pa has been sent to [facility] for approval. Will check tomorrow for status.Marland KitchenRaechel Chute

## 2020-07-09 NOTE — Telephone Encounter (Signed)
Rec'd PA back status of request Denied. It states " [facility] cannot perform PA because PA is not required for requested drug, and medication quantities above the benefit limit are excluded under the plan. Faxed letter two pharmacy as well.Marland KitchenRaechel Chute

## 2020-07-31 ENCOUNTER — Other Ambulatory Visit: Payer: Self-pay

## 2020-07-31 ENCOUNTER — Ambulatory Visit: Payer: No Typology Code available for payment source | Admitting: Internal Medicine

## 2020-07-31 ENCOUNTER — Encounter: Payer: Self-pay | Admitting: Internal Medicine

## 2020-07-31 DIAGNOSIS — I2583 Coronary atherosclerosis due to lipid rich plaque: Secondary | ICD-10-CM

## 2020-07-31 DIAGNOSIS — M79605 Pain in left leg: Secondary | ICD-10-CM

## 2020-07-31 DIAGNOSIS — M79604 Pain in right leg: Secondary | ICD-10-CM | POA: Diagnosis not present

## 2020-07-31 DIAGNOSIS — M545 Low back pain, unspecified: Secondary | ICD-10-CM

## 2020-07-31 DIAGNOSIS — I10 Essential (primary) hypertension: Secondary | ICD-10-CM

## 2020-07-31 DIAGNOSIS — I2510 Atherosclerotic heart disease of native coronary artery without angina pectoris: Secondary | ICD-10-CM

## 2020-07-31 MED ORDER — MORPHINE SULFATE ER 100 MG PO TBCR: 200.0000 mg | EXTENDED_RELEASE_TABLET | Freq: Three times a day (TID) | ORAL | 0 refills | Status: DC

## 2020-07-31 MED ORDER — MORPHINE SULFATE 15 MG PO TABS
15.0000 mg | ORAL_TABLET | Freq: Two times a day (BID) | ORAL | 0 refills | Status: DC | PRN
Start: 2020-07-31 — End: 2020-10-30

## 2020-07-31 MED ORDER — MORPHINE SULFATE ER 100 MG PO TBCR
200.0000 mg | EXTENDED_RELEASE_TABLET | Freq: Three times a day (TID) | ORAL | 0 refills | Status: DC
Start: 2020-07-31 — End: 2020-10-30

## 2020-07-31 NOTE — Assessment & Plan Note (Signed)
Chronic severe LBP, on opioids On MSIR, MSContin   Potential benefits of a long term opioids  use as well as potential risks  and complications were explained to the patient and were aknowledged. UDS

## 2020-07-31 NOTE — Assessment & Plan Note (Signed)
No angina On Lovastatin

## 2020-07-31 NOTE — Assessment & Plan Note (Signed)
BP Readings from Last 3 Encounters:  07/31/20 126/70  05/09/20 132/80  04/26/20 (!) 148/78

## 2020-07-31 NOTE — Progress Notes (Signed)
Subjective:  Patient ID: [name] [name], male    DOB: 19-Dec-1965  Age: 54 y.o. MRN: [id number]  CC: Follow-up (3 month f/u meds, no concerns)   HPI [name] [name] presents for chronic LBP, DM, HTN  Outpatient Medications Prior to Visit  Medication Sig Dispense Refill   acarbose (PRECOSE) 25 MG tablet Take 1 tablet (25 mg total) by mouth 3 (three) times daily with meals. 90 tablet 11   amLODipine-valsartan (EXFORGE) 10-320 MG tablet TAKE 1 TABLET BY MOUTH EVERY DAY 90 tablet 3   ANORO ELLIPTA 62.5-25 MCG/INH AEPB INHALE 1 PUFF INTO THE LUNGS EVERY DAY 180 each 3   aspirin EC 81 MG tablet Take 81 mg by mouth daily.     B-D ULTRAFINE III SHORT PEN 31G X 8 MM MISC USE AS DIRECTED THREE TIMES DAILY 100 each 0   fluticasone (FLONASE) 50 MCG/ACT nasal spray SPRAY 2 SPRAYS INTO EACH NOSTRIL EVERY DAY 48 mL 1   glucose blood (ONETOUCH ULTRA) test strip 1 each by Other route 2 (two) times daily. And lancets 2/day 180 strip 3   insulin glargine, 2 Unit Dial, (TOUJEO MAX SOLOSTAR) 300 UNIT/ML Solostar Pen Inject 70 Units into the skin every morning. And pen needles 1/day. 6 pen 3   lovastatin (MEVACOR) 20 MG tablet TAKE 1 TABLET BY MOUTH EVERY DAY 90 tablet 3   metFORMIN (GLUCOPHAGE-XR) 500 MG 24 hr tablet Take 1 tablet (500 mg total) by mouth daily with breakfast. 90 tablet 3   morphine (MS CONTIN) 100 MG 12 hr tablet Take 2 tablets (200 mg total) by mouth 3 (three) times daily. 180 tablet 0   morphine (MSIR) 15 MG tablet Take 1 tablet (15 mg total) by mouth 2 (two) times daily as needed for severe pain. 60 tablet 0   morphine (MSIR) 15 MG tablet Take 1 tablet (15 mg total) by mouth 2 (two) times daily as needed for severe pain. 60 tablet 0   morphine (MSIR) 15 MG tablet Take 1 tablet (15 mg total) by mouth 2 (two) times daily as needed for severe pain. 60 tablet 0   OneTouch Delica Lancets 33G MISC USE TO CHECK BLOOD SUGAR TWICE DAILY 100 each 3   Semaglutide (RYBELSUS) 14 MG  TABS Take 14 mg by mouth daily. 90 tablet 3   Cholecalciferol (VITAMIN D3) 1000 UNITS CAPS Take 1 capsule by mouth daily.   (Patient not taking: Reported on 07/31/2020)     ketorolac (TORADOL) 10 MG tablet Take 1 tablet (10 mg total) by mouth every 8 (eight) hours as needed for severe pain (renal colic). (Patient not taking: Reported on 07/31/2020) 20 tablet 0   No facility-administered medications prior to visit.    ROS: Review of Systems  Constitutional: Negative for appetite change, fatigue and unexpected weight change.  HENT: Negative for congestion, nosebleeds, sneezing, sore throat and trouble swallowing.   Eyes: Negative for itching and visual disturbance.  Respiratory: Negative for cough.   Cardiovascular: Negative for chest pain, palpitations and leg swelling.  Gastrointestinal: Negative for abdominal distention, blood in stool, diarrhea and nausea.  Genitourinary: Negative for frequency and hematuria.  Musculoskeletal: Positive for back pain. Negative for gait problem, joint swelling and neck pain.  Skin: Negative for rash.  Neurological: Negative for dizziness, tremors, speech difficulty and weakness.  Psychiatric/Behavioral: Negative for agitation, dysphoric mood, sleep disturbance and suicidal ideas. The patient is not nervous/anxious.     Objective:  BP 126/70    Pulse 83  Temp 98.2 F (36.8 C) (Oral)    Ht 5\' 9"  (1.753 m)    Wt 245 lb 12.8 oz (111.5 kg)    SpO2 97%    BMI 36.30 kg/m   BP Readings from Last 3 Encounters:  07/31/20 126/70  05/09/20 132/80  04/26/20 (!) 148/78    Wt Readings from Last 3 Encounters:  07/31/20 245 lb 12.8 oz (111.5 kg)  05/09/20 250 lb 12.8 oz (113.8 kg)  04/26/20 250 lb (113.4 kg)    Physical Exam Constitutional:      General: He is not in acute distress.    Appearance: He is well-developed. He is obese.     Comments: NAD  Eyes:     Conjunctiva/sclera: Conjunctivae normal.     Pupils: Pupils are equal, round, and reactive  to light.  Neck:     Thyroid: No thyromegaly.     Vascular: No JVD.  Cardiovascular:     Rate and Rhythm: Normal rate and regular rhythm.     Heart sounds: Normal heart sounds. No murmur heard.  No friction rub. No gallop.   Pulmonary:     Effort: Pulmonary effort is normal. No respiratory distress.     Breath sounds: Normal breath sounds. No wheezing or rales.  Chest:     Chest wall: No tenderness.  Abdominal:     General: Bowel sounds are normal. There is no distension.     Palpations: Abdomen is soft. There is no mass.     Tenderness: There is no abdominal tenderness. There is no guarding or rebound.  Musculoskeletal:        General: Tenderness present. Normal range of motion.     Cervical back: Normal range of motion.  Lymphadenopathy:     Cervical: No cervical adenopathy.  Skin:    General: Skin is warm and dry.     Findings: No rash.  Neurological:     Mental Status: He is alert and oriented to person, place, and time.     Cranial Nerves: No cranial nerve deficit.     Motor: No abnormal muscle tone.     Coordination: Coordination normal.     Gait: Gait normal.     Deep Tendon Reflexes: Reflexes are normal and symmetric.  Psychiatric:        Behavior: Behavior normal.        Thought Content: Thought content normal.        Judgment: Judgment normal.   LS w/pain  Lab Results  Component Value Date   WBC 10.3 04/23/2018   HGB 15.0 04/23/2018   HCT 44.0 04/23/2018   PLT 206.0 04/23/2018   GLUCOSE 231 (H) 10/28/2018   CHOL 127 04/23/2018   TRIG 86.0 04/23/2018   HDL 44.70 04/23/2018   LDLDIRECT 116.6 05/20/2007   LDLCALC 66 04/23/2018   ALT 33 04/23/2018   AST 24 04/23/2018   NA 133 (L) 10/28/2018   K 4.0 10/28/2018   CL 97 10/28/2018   CREATININE 0.79 10/28/2018   BUN 11 10/28/2018   CO2 29 10/28/2018   TSH 2.64 04/23/2018   PSA 0.20 04/23/2018   INR 1.1 05/10/2008   HGBA1C 7.9 (A) 05/09/2020   MICROALBUR 1.7 04/23/2018    VAS 04/25/2018 CAROTID  Result  Date: 12/01/2019 Carotid Arterial Duplex Study Indications:  Mild bilateral bruits. Risk Factors: Hypertension, hyperlipidemia, Diabetes, current smoker, coronary               artery disease. Performing Technologist: 01/31/2020 RVT  Examination Guidelines: A complete evaluation includes B-mode imaging, spectral Doppler, color Doppler, and power Doppler as needed of all accessible portions of each vessel. Bilateral testing is considered an integral part of a complete examination. Limited examinations for reoccurring indications may be performed as noted.  Right Carotid Findings: +----------+--------+--------+--------+----------------------+--------+             PSV cm/s EDV cm/s Stenosis Plaque Description     Comments  +----------+--------+--------+--------+----------------------+--------+  CCA Prox   89       12                                                 +----------+--------+--------+--------+----------------------+--------+  CCA Distal 64       16                                                 +----------+--------+--------+--------+----------------------+--------+  ICA Prox   52       14                focal and heterogenous           +----------+--------+--------+--------+----------------------+--------+  ICA Mid    55       22       1-39%                                     +----------+--------+--------+--------+----------------------+--------+  ICA Distal 97       27                                       tortuous  +----------+--------+--------+--------+----------------------+--------+  ECA        95       17                                                 +----------+--------+--------+--------+----------------------+--------+ +----------+--------+-------+----------------+-------------------+             PSV cm/s EDV cms Describe         Arm Pressure (mmHG)  +----------+--------+-------+----------------+-------------------+  Subclavian 119              Multiphasic, WNL 140                   +----------+--------+-------+----------------+-------------------+ +---------+--------+--+--------+--+---------+  Vertebral PSV cm/s 44 EDV cm/s 10 Antegrade  +---------+--------+--+--------+--+---------+  Left Carotid Findings: +----------+--------+--------+--------+----------------------+--------+             PSV cm/s EDV cm/s Stenosis Plaque Description     Comments  +----------+--------+--------+--------+----------------------+--------+  CCA Prox   94       18                                                 +----------+--------+--------+--------+----------------------+--------+  CCA Distal 58  18                                                 +----------+--------+--------+--------+----------------------+--------+  ICA Prox   73       22       1-39%    focal and heterogenous           +----------+--------+--------+--------+----------------------+--------+  ICA Mid    70       26                                                 +----------+--------+--------+--------+----------------------+--------+  ICA Distal 101      28                                                 +----------+--------+--------+--------+----------------------+--------+  ECA        110      21                                                 +----------+--------+--------+--------+----------------------+--------+ +----------+--------+--------+----------------+-------------------+             PSV cm/s EDV cm/s Describe         Arm Pressure (mmHG)  +----------+--------+--------+----------------+-------------------+  Subclavian 154               Multiphasic, WNL 140                  +----------+--------+--------+----------------+-------------------+ +---------+--------+--+--------+--+---------+  Vertebral PSV cm/s 47 EDV cm/s 14 Antegrade  +---------+--------+--+--------+--+---------+   Summary: Right Carotid: Velocities in the right ICA are consistent with a 1-39% stenosis. Left Carotid: Velocities in the left ICA are consistent with a 1-39%  stenosis. Vertebrals:  Bilateral vertebral arteries demonstrate antegrade flow. Subclavians: Normal flow hemodynamics were seen in bilateral subclavian              arteries. *See table(s) above for measurements and observations.  Electronically signed by Charlton HawsPeter Nishan MD on 12/01/2019 at 5:00:21 PM.    Final     Assessment & Plan:    [name] PrimesAlex Ettel Albergo, MD

## 2020-08-06 LAB — PMP SCREEN PROFILE (10S), URINE
Amphetamine Scrn, Ur: NEGATIVE ng/mL
BARBITURATE SCREEN URINE: NEGATIVE ng/mL
BENZODIAZEPINE SCREEN, URINE: NEGATIVE ng/mL
CANNABINOIDS UR QL SCN: NEGATIVE ng/mL
Cocaine (Metab) Scrn, Ur: NEGATIVE ng/mL
Creatinine(Crt), U: 48.6 mg/dL (ref 20.0–300.0)
Methadone Screen, Urine: NEGATIVE ng/mL
OXYCODONE+OXYMORPHONE UR QL SCN: NEGATIVE ng/mL
Opiate Scrn, Ur: POSITIVE ng/mL — AB
Ph of Urine: 8.5 (ref 4.5–8.9)
Phencyclidine Qn, Ur: NEGATIVE ng/mL
Propoxyphene Scrn, Ur: NEGATIVE ng/mL

## 2020-08-08 ENCOUNTER — Other Ambulatory Visit: Payer: Self-pay

## 2020-08-08 ENCOUNTER — Encounter: Payer: Self-pay | Admitting: Endocrinology

## 2020-08-08 ENCOUNTER — Ambulatory Visit: Payer: No Typology Code available for payment source | Admitting: Endocrinology

## 2020-08-08 VITALS — BP 160/82 | HR 45 | Ht 69.0 in | Wt 249.0 lb

## 2020-08-08 DIAGNOSIS — E1165 Type 2 diabetes mellitus with hyperglycemia: Secondary | ICD-10-CM | POA: Diagnosis not present

## 2020-08-08 LAB — POCT GLYCOSYLATED HEMOGLOBIN (HGB A1C): Hemoglobin A1C: 8.1 % — AB (ref 4.0–5.6)

## 2020-08-08 MED ORDER — TOUJEO MAX SOLOSTAR 300 UNIT/ML ~~LOC~~ SOPN
74.0000 [IU] | PEN_INJECTOR | SUBCUTANEOUS | 3 refills | Status: DC
Start: 2020-08-08 — End: 2021-03-12

## 2020-08-08 NOTE — Progress Notes (Signed)
Subjective:    Patient ID: [name] [name], male    DOB: 1966-02-10, 54 y.o.   MRN: [id number]  HPI Pt returns for f/u of diabetes mellitus:  DM type: Insulin-requiring type 2 Dx'ed: 2005 Complications: CAD Therapy: insulin since 2007, and 3 oral meds.   DKA: never Severe hypoglycemia: never.  Pancreatitis: never.   Other: he takes QD insulin, due to h/o noncompliance; he works M-F, as a Nutritional therapist; He says cbg's on work days=days off.  Interval history: no cbg record, but states cbg's vary from 92-180.  He seldom has hypoglycemia, and these episodes are mild.  It is in general lowest before lunch, and highest at HS.  He says he never misses meds.  Pt says constipation is less, with taking acarbose.  Past Medical History:  Diagnosis Date  . COPD (chronic obstructive pulmonary disease) (HCC)   . Diabetes mellitus   . GERD (gastroesophageal reflux disease)   . Hyperlipidemia   . Hypertension   . Lower back pain   . Nephrolithiasis    Dr Annabell Howells    Past Surgical History:  Procedure Laterality Date  . KNEE SURGERY      Social History   Socioeconomic History  . Marital status: Married    Spouse name: Not on file  . Number of children: 2  . Years of education: Not on file  . Highest education level: Not on file  Occupational History  . Not on file  Tobacco Use  . Smoking status: Current Every Day Smoker    Packs/day: 2.00    Types: Cigarettes  . Smokeless tobacco: Never Used  Substance and Sexual Activity  . Alcohol use: No  . Drug use: No  . Sexual activity: Yes  Other Topics Concern  . Not on file  Social History Narrative   No regular exercise   Social Determinants of Health   Financial Resource Strain: Not on file  Food Insecurity: Not on file  Transportation Needs: Not on file  Physical Activity: Not on file  Stress: Not on file  Social Connections: Not on file  Intimate Partner Violence: Not on file    Current Outpatient Medications on File Prior to  Visit  Medication Sig Dispense Refill  . acarbose (PRECOSE) 25 MG tablet Take 1 tablet (25 mg total) by mouth 3 (three) times daily with meals. 90 tablet 11  . amLODipine-valsartan (EXFORGE) 10-320 MG tablet TAKE 1 TABLET BY MOUTH EVERY DAY 90 tablet 3  . ANORO ELLIPTA 62.5-25 MCG/INH AEPB INHALE 1 PUFF INTO THE LUNGS EVERY DAY 180 each 3  . aspirin EC 81 MG tablet Take 81 mg by mouth daily.    . B-D ULTRAFINE III SHORT PEN 31G X 8 MM MISC USE AS DIRECTED THREE TIMES DAILY 100 each 0  . Cholecalciferol (VITAMIN D3) 1000 UNITS CAPS Take 1 capsule by mouth daily.      . fluticasone (FLONASE) 50 MCG/ACT nasal spray SPRAY 2 SPRAYS INTO EACH NOSTRIL EVERY DAY 48 mL 1  . glucose blood (ONETOUCH ULTRA) test strip 1 each by Other route 2 (two) times daily. And lancets 2/day 180 strip 3  . ketorolac (TORADOL) 10 MG tablet Take 1 tablet (10 mg total) by mouth every 8 (eight) hours as needed for severe pain (renal colic). 20 tablet 0  . lovastatin (MEVACOR) 20 MG tablet TAKE 1 TABLET BY MOUTH EVERY DAY 90 tablet 3  . metFORMIN (GLUCOPHAGE-XR) 500 MG 24 hr tablet Take 1 tablet (500 mg total) by  mouth daily with breakfast. 90 tablet 3  . morphine (MS CONTIN) 100 MG 12 hr tablet Take 2 tablets (200 mg total) by mouth 3 (three) times daily. 180 tablet 0  . morphine (MS CONTIN) 100 MG 12 hr tablet Take 2 tablets (200 mg total) by mouth 3 (three) times daily. 180 tablet 0  . morphine (MS CONTIN) 100 MG 12 hr tablet Take 2 tablets (200 mg total) by mouth 3 (three) times daily. 180 tablet 0  . morphine (MSIR) 15 MG tablet Take 1 tablet (15 mg total) by mouth 2 (two) times daily as needed for severe pain. 60 tablet 0  . morphine (MSIR) 15 MG tablet Take 1 tablet (15 mg total) by mouth 2 (two) times daily as needed for severe pain. 60 tablet 0  . morphine (MSIR) 15 MG tablet Take 1 tablet (15 mg total) by mouth 2 (two) times daily as needed for severe pain. 60 tablet 0  . OneTouch Delica Lancets 33G MISC USE TO CHECK  BLOOD SUGAR TWICE DAILY 100 each 3  . Semaglutide (RYBELSUS) 14 MG TABS Take 14 mg by mouth daily. 90 tablet 3   No current facility-administered medications on file prior to visit.    Allergies  Allergen Reactions  . Belviq [Lorcaserin Hcl]     wierd  . Benazepril Hcl     REACTION: cough  . Nsaids     Can take short term Upset stomach    Family History  Problem Relation Age of Onset  . Hypertension Mother   . Diabetes Mother   . CAD Mother   . Heart disease Other        CAD male relative <60  . Vision loss Sister     BP (!) 160/82   Pulse (!) 45   Ht 5\' 9"  (1.753 m)   Wt 249 lb (112.9 kg)   SpO2 98%   BMI 36.77 kg/m    Review of Systems Denies LOC    Objective:   Physical Exam VITAL SIGNS:  See vs page GENERAL: no distress Pulses: dorsalis pedis absent bilat (poss due to edema).    MSK: no deformity of the feet CV: 2+ bilat leg edema Skin:  no ulcer on the feet.  normal color and temp on the feet.  Neuro: sensation is intact to touch on the feet.    Lab Results  Component Value Date   HGBA1C 8.1 (A) 08/08/2020      Assessment & Plan:  Insulin-requiring type 2 DM, with CAD: uncontrolled Hypoglycemia, due to insulin: this limits aggressiveness of glycemic control Constipation: improved with acarbose.  HTN: recheck next time.  Patient Instructions  I have sent a prescription to your pharmacy, to increase the Toujeo to 74 units each morning Please continue the same other diabetes medications.   check your blood sugar twice a day.  vary the time of day when you check, between before the 3 meals, and at bedtime.  also check if you have symptoms of your blood sugar being too high or too low.  please keep a record of the readings and bring it to your next appointment here.  You can write it on any piece of paper.  please call 14/04/2020 sooner if your blood sugar goes below 70, or if you have a lot of readings over 200.   On this type of insulin schedule, you should  eat meals on a regular schedule (especially lunch).  If a meal is missed or significantly delayed,  your blood sugar could go low.   Please come back for a follow-up appointment in 3 months.

## 2020-08-08 NOTE — Patient Instructions (Addendum)
I have sent a prescription to your pharmacy, to increase the Toujeo to 74 units each morning Please continue the same other diabetes medications.   check your blood sugar twice a day.  vary the time of day when you check, between before the 3 meals, and at bedtime.  also check if you have symptoms of your blood sugar being too high or too low.  please keep a record of the readings and bring it to your next appointment here.  You can write it on any piece of paper.  please call us sooner if your blood sugar goes below 70, or if you have a lot of readings over 200.   On this type of insulin schedule, you should eat meals on a regular schedule (especially lunch).  If a meal is missed or significantly delayed, your blood sugar could go low.   Please come back for a follow-up appointment in 3 months.

## 2020-09-12 ENCOUNTER — Telehealth: Payer: Self-pay | Admitting: *Deleted

## 2020-09-12 NOTE — Telephone Encounter (Signed)
Started PA--[id number]--Rx RYBELSUS

## 2020-10-01 ENCOUNTER — Other Ambulatory Visit: Payer: Self-pay | Admitting: Endocrinology

## 2020-10-01 DIAGNOSIS — E1165 Type 2 diabetes mellitus with hyperglycemia: Secondary | ICD-10-CM

## 2020-10-30 ENCOUNTER — Other Ambulatory Visit: Payer: Self-pay

## 2020-10-30 ENCOUNTER — Encounter: Payer: Self-pay | Admitting: Internal Medicine

## 2020-10-30 ENCOUNTER — Ambulatory Visit: Payer: No Typology Code available for payment source | Admitting: Internal Medicine

## 2020-10-30 DIAGNOSIS — M79605 Pain in left leg: Secondary | ICD-10-CM

## 2020-10-30 DIAGNOSIS — I10 Essential (primary) hypertension: Secondary | ICD-10-CM

## 2020-10-30 DIAGNOSIS — E1165 Type 2 diabetes mellitus with hyperglycemia: Secondary | ICD-10-CM

## 2020-10-30 DIAGNOSIS — M545 Low back pain, unspecified: Secondary | ICD-10-CM

## 2020-10-30 DIAGNOSIS — I2510 Atherosclerotic heart disease of native coronary artery without angina pectoris: Secondary | ICD-10-CM | POA: Diagnosis not present

## 2020-10-30 DIAGNOSIS — M79604 Pain in right leg: Secondary | ICD-10-CM

## 2020-10-30 DIAGNOSIS — I2583 Coronary atherosclerosis due to lipid rich plaque: Secondary | ICD-10-CM

## 2020-10-30 MED ORDER — MORPHINE SULFATE ER 100 MG PO TBCR: 200.0000 mg | EXTENDED_RELEASE_TABLET | Freq: Three times a day (TID) | ORAL | 0 refills | Status: DC

## 2020-10-30 MED ORDER — MORPHINE SULFATE 15 MG PO TABS: 15.0000 mg | ORAL_TABLET | Freq: Two times a day (BID) | ORAL | 0 refills | Status: DC | PRN

## 2020-10-30 NOTE — Progress Notes (Signed)
Subjective:  Patient ID: [name] [name], male    DOB: 1966-05-01  Age: 54 y.o. MRN: [id number]  CC: Follow-up (3 month f/u)   HPI [name] [name] presents for LBP, DM, HTN   Outpatient Medications Prior to Visit  Medication Sig Dispense Refill  . acarbose (PRECOSE) 25 MG tablet Take 1 tablet (25 mg total) by mouth 3 (three) times daily with meals. 90 tablet 11  . amLODipine-valsartan (EXFORGE) 10-320 MG tablet TAKE 1 TABLET BY MOUTH EVERY DAY 90 tablet 3  . ANORO ELLIPTA 62.5-25 MCG/INH AEPB INHALE 1 PUFF INTO THE LUNGS EVERY DAY 180 each 3  . aspirin EC 81 MG tablet Take 81 mg by mouth daily.    . B-D ULTRAFINE III SHORT PEN 31G X 8 MM MISC USE AS DIRECTED THREE TIMES DAILY 100 each 0  . Cholecalciferol (VITAMIN D3) 1000 UNITS CAPS Take 1 capsule by mouth daily.      . fluticasone (FLONASE) 50 MCG/ACT nasal spray SPRAY 2 SPRAYS INTO EACH NOSTRIL EVERY DAY 48 mL 1  . insulin glargine, 2 Unit Dial, (TOUJEO MAX SOLOSTAR) 300 UNIT/ML Solostar Pen Inject 74 Units into the skin every morning. And pen needles 1/day. 30 mL 3  . ketorolac (TORADOL) 10 MG tablet Take 1 tablet (10 mg total) by mouth every 8 (eight) hours as needed for severe pain (renal colic). 20 tablet 0  . lovastatin (MEVACOR) 20 MG tablet TAKE 1 TABLET BY MOUTH EVERY DAY 90 tablet 3  . metFORMIN (GLUCOPHAGE-XR) 500 MG 24 hr tablet Take 1 tablet (500 mg total) by mouth daily with breakfast. 90 tablet 3  . morphine (MS CONTIN) 100 MG 12 hr tablet Take 2 tablets (200 mg total) by mouth 3 (three) times daily. 180 tablet 0  . morphine (MS CONTIN) 100 MG 12 hr tablet Take 2 tablets (200 mg total) by mouth 3 (three) times daily. 180 tablet 0  . morphine (MS CONTIN) 100 MG 12 hr tablet Take 2 tablets (200 mg total) by mouth 3 (three) times daily. 180 tablet 0  . morphine (MSIR) 15 MG tablet Take 1 tablet (15 mg total) by mouth 2 (two) times daily as needed for severe pain. 60 tablet 0  . morphine (MSIR) 15 MG tablet Take 1 tablet  (15 mg total) by mouth 2 (two) times daily as needed for severe pain. 60 tablet 0  . morphine (MSIR) 15 MG tablet Take 1 tablet (15 mg total) by mouth 2 (two) times daily as needed for severe pain. 60 tablet 0  . OneTouch Delica Lancets 33G MISC USE TO CHECK BLOOD SUGAR TWICE DAILY 100 each 3  . ONETOUCH ULTRA test strip test TWICE DAILY 100 strip 0  . Semaglutide (RYBELSUS) 14 MG TABS Take 14 mg by mouth daily. 90 tablet 3   No facility-administered medications prior to visit.    ROS: Review of Systems  Constitutional: Positive for fatigue. Negative for appetite change and unexpected weight change.  HENT: Negative for congestion, nosebleeds, sneezing, sore throat and trouble swallowing.   Eyes: Negative for itching and visual disturbance.  Respiratory: Negative for cough.   Cardiovascular: Negative for chest pain, palpitations and leg swelling.  Gastrointestinal: Negative for abdominal distention, blood in stool, diarrhea and nausea.  Genitourinary: Negative for frequency and hematuria.  Musculoskeletal: Positive for back pain. Negative for gait problem, joint swelling and neck pain.  Skin: Negative for rash.  Neurological: Negative for dizziness, tremors, speech difficulty and weakness.  Psychiatric/Behavioral: Negative for agitation,  dysphoric mood, sleep disturbance and suicidal ideas. The patient is not nervous/anxious.     Objective:  BP (!) 162/70 (BP Location: Left Arm)   Pulse 60   Temp 98.6 F (37 C) (Oral)   Ht 5\' 9"  (1.753 m)   Wt 250 lb (113.4 kg)   SpO2 94%   BMI 36.92 kg/m   BP Readings from Last 3 Encounters:  10/30/20 (!) 162/70  08/08/20 (!) 160/82  07/31/20 126/70    Wt Readings from Last 3 Encounters:  10/30/20 250 lb (113.4 kg)  08/08/20 249 lb (112.9 kg)  07/31/20 245 lb 12.8 oz (111.5 kg)    Physical Exam Constitutional:      General: He is not in acute distress.    Appearance: He is well-developed. He is obese.     Comments: NAD  HENT:      Mouth/Throat:     Mouth: Oropharynx is clear and moist.  Eyes:     Conjunctiva/sclera: Conjunctivae normal.     Pupils: Pupils are equal, round, and reactive to light.  Neck:     Thyroid: No thyromegaly.     Vascular: No JVD.  Cardiovascular:     Rate and Rhythm: Normal rate and regular rhythm.     Pulses: Intact distal pulses.     Heart sounds: Normal heart sounds. No murmur heard. No friction rub. No gallop.   Pulmonary:     Effort: Pulmonary effort is normal. No respiratory distress.     Breath sounds: Normal breath sounds. No wheezing or rales.  Chest:     Chest wall: No tenderness.  Abdominal:     General: Bowel sounds are normal. There is no distension.     Palpations: Abdomen is soft. There is no mass.     Tenderness: There is no abdominal tenderness. There is no guarding or rebound.  Musculoskeletal:        General: Tenderness present. No edema. Normal range of motion.     Cervical back: Normal range of motion.  Lymphadenopathy:     Cervical: No cervical adenopathy.  Skin:    General: Skin is warm and dry.     Findings: No rash.  Neurological:     Mental Status: He is alert and oriented to person, place, and time.     Cranial Nerves: No cranial nerve deficit.     Motor: No abnormal muscle tone.     Coordination: He displays a negative Romberg sign. Coordination normal.     Gait: Gait normal.     Deep Tendon Reflexes: Reflexes are normal and symmetric.  Psychiatric:        Mood and Affect: Mood and affect normal.        Behavior: Behavior normal.        Thought Content: Thought content normal.        Judgment: Judgment normal.    LS tender   Lab Results  Component Value Date   WBC 10.3 04/23/2018   HGB 15.0 04/23/2018   HCT 44.0 04/23/2018   PLT 206.0 04/23/2018   GLUCOSE 231 (H) 10/28/2018   CHOL 127 04/23/2018   TRIG 86.0 04/23/2018   HDL 44.70 04/23/2018   LDLDIRECT 116.6 05/20/2007   LDLCALC 66 04/23/2018   ALT 33 04/23/2018   AST 24 04/23/2018    NA 133 (L) 10/28/2018   K 4.0 10/28/2018   CL 97 10/28/2018   CREATININE 0.79 10/28/2018   BUN 11 10/28/2018   CO2 29 10/28/2018   TSH  2.64 04/23/2018   PSA 0.20 04/23/2018   INR 1.1 05/10/2008   HGBA1C 8.1 (A) 08/08/2020   MICROALBUR 1.7 04/23/2018    VAS US CAROTID  Result Date: 12/01/2019 Carotid Arterial Duplex Study Indications:  Mild bilateral bruits. Risk Factors: Hypertension, hyperlipidemia, Diabetes, current smoker, coronary               artery disease. Performing Technologist: Olegario Shearer RVT  Examination Guidelines: A complete evaluation includes B-mode imaging, spectral Doppler, color Doppler, and power Doppler as needed of all accessible portions of each vessel. Bilateral testing is considered an integral part of a complete examination. Limited examinations for reoccurring indications may be performed as noted.  Right Carotid Findings: +----------+--------+--------+--------+----------------------+--------+           PSV cm/sEDV cm/sStenosisPlaque Description    Comments +----------+--------+--------+--------+----------------------+--------+ CCA Prox  89      12                                             +----------+--------+--------+--------+----------------------+--------+ CCA Distal64      16                                             +----------+--------+--------+--------+----------------------+--------+ ICA Prox  52      14              focal and heterogenous         +----------+--------+--------+--------+----------------------+--------+ ICA Mid   55      22      1-39%                                  +----------+--------+--------+--------+----------------------+--------+ ICA Distal97      27                                    tortuous +----------+--------+--------+--------+----------------------+--------+ ECA       95      17                                              +----------+--------+--------+--------+----------------------+--------+ +----------+--------+-------+----------------+-------------------+           PSV cm/sEDV cmsDescribe        Arm Pressure (mmHG) +----------+--------+-------+----------------+-------------------+ Subclavian119            Multiphasic, SEG315                 +----------+--------+-------+----------------+-------------------+ +---------+--------+--+--------+--+---------+ VertebralPSV cm/s44EDV cm/s10Antegrade +---------+--------+--+--------+--+---------+  Left Carotid Findings: +----------+--------+--------+--------+----------------------+--------+           PSV cm/sEDV cm/sStenosisPlaque Description    Comments +----------+--------+--------+--------+----------------------+--------+ CCA Prox  94      18                                             +----------+--------+--------+--------+----------------------+--------+ CCA Distal58      18                                             +----------+--------+--------+--------+----------------------+--------+  ICA Prox  73      22      1-39%   focal and heterogenous         +----------+--------+--------+--------+----------------------+--------+ ICA Mid   70      26                                             +----------+--------+--------+--------+----------------------+--------+ ICA Distal101     28                                             +----------+--------+--------+--------+----------------------+--------+ ECA       110     21                                             +----------+--------+--------+--------+----------------------+--------+ +----------+--------+--------+----------------+-------------------+           PSV cm/sEDV cm/sDescribe        Arm Pressure (mmHG) +----------+--------+--------+----------------+-------------------+ [id number]             Multiphasic, [id number]                  +----------+--------+--------+----------------+-------------------+ +---------+--------+--+--------+--+---------+ VertebralPSV cm/s47EDV cm/s14Antegrade +---------+--------+--+--------+--+---------+   Summary: Right Carotid: Velocities in the right ICA are consistent with a 1-39% stenosis. Left Carotid: Velocities in the left ICA are consistent with a 1-39% stenosis. Vertebrals:  Bilateral vertebral arteries demonstrate antegrade flow. Subclavians: Normal flow hemodynamics were seen in bilateral subclavian              arteries. *See table(s) above for measurements and observations.  Electronically signed by Charlton HawsPeter Nishan MD on 12/01/2019 at 5:00:21 PM.    Final     Assessment & Plan:   There are no diagnoses linked to this encounter.   No orders of the defined types were placed in this encounter.    Follow-up: No follow-ups on file.  Sonda PrimesAlex Kahiau Schewe, MD

## 2020-10-30 NOTE — Assessment & Plan Note (Signed)
On MSIR, MSContin   Potential benefits of a long term opioids  use as well as potential risks  and complications were explained to the patient and were aknowledged.

## 2020-10-30 NOTE — Assessment & Plan Note (Signed)
Humalog, Basaglar, Rybelsus

## 2020-10-30 NOTE — Assessment & Plan Note (Signed)
Cont w/Amlod-Valsartan

## 2020-11-05 NOTE — Assessment & Plan Note (Signed)
No angina.  Continue with lovastatin

## 2020-11-06 ENCOUNTER — Ambulatory Visit: Payer: No Typology Code available for payment source | Admitting: Endocrinology

## 2020-11-06 ENCOUNTER — Other Ambulatory Visit: Payer: Self-pay

## 2020-11-06 VITALS — BP 140/70 | HR 42 | Ht 70.0 in | Wt 251.2 lb

## 2020-11-06 DIAGNOSIS — E1165 Type 2 diabetes mellitus with hyperglycemia: Secondary | ICD-10-CM

## 2020-11-06 LAB — POCT GLYCOSYLATED HEMOGLOBIN (HGB A1C): Hemoglobin A1C: 7.7 % — AB (ref 4.0–5.6)

## 2020-11-06 NOTE — Patient Instructions (Addendum)
Please let me know if an alternative to Toujeo (glargine, Semglee, or Basaglar) is cheaper, and i'll send a new prescription Please continue the same 4 diabetes medications.   check your blood sugar twice a day.  vary the time of day when you check, between before the 3 meals, and at bedtime.  also check if you have symptoms of your blood sugar being too high or too low.  please keep a record of the readings and bring it to your next appointment here.  You can write it on any piece of paper.  please call us sooner if your blood sugar goes below 70, or if you have a lot of readings over 200.   On this type of insulin schedule, you should eat meals on a regular schedule (especially lunch).  If a meal is missed or significantly delayed, your blood sugar could go low.   I have asked Dr Plotnikov's office to call you to set up and EKG.  you will receive a phone call, about a day and time for an appointment Please come back for a follow-up appointment in 4 months.

## 2020-11-06 NOTE — Progress Notes (Signed)
Subjective:    Patient ID: [name] [name], male    DOB: 1966-07-19, 55 y.o.   MRN: [id number]  HPI Pt returns for f/u of diabetes mellitus:  DM type: Insulin-requiring type 2.   Dx'ed: 2005 Complications: CAD Therapy: insulin since 2007, and 3 oral meds.    DKA: never Severe hypoglycemia: never.  Pancreatitis: never.   Other: he takes QD insulin, due to h/o noncompliance; he works M-F, as a Nutritional therapist; edema limited rx options; He says cbg's on work days=days off.  Interval history: no cbg record, but states cbg's vary from 64-160.  He seldom has hypoglycemia, and these episodes are mild.  It is in general lowest fasting  He says he never misses meds.   Past Medical History:  Diagnosis Date  . COPD (chronic obstructive pulmonary disease) (HCC)   . Diabetes mellitus   . GERD (gastroesophageal reflux disease)   . Hyperlipidemia   . Hypertension   . Lower back pain   . Nephrolithiasis    Dr Annabell Howells    Past Surgical History:  Procedure Laterality Date  . KNEE SURGERY      Social History   Socioeconomic History  . Marital status: Married    Spouse name: Not on file  . Number of children: 2  . Years of education: Not on file  . Highest education level: Not on file  Occupational History  . Not on file  Tobacco Use  . Smoking status: Current Every Day Smoker    Packs/day: 2.00    Types: Cigarettes  . Smokeless tobacco: Never Used  Substance and Sexual Activity  . Alcohol use: No  . Drug use: No  . Sexual activity: Yes  Other Topics Concern  . Not on file  Social History Narrative   No regular exercise   Social Determinants of Health   Financial Resource Strain: Not on file  Food Insecurity: Not on file  Transportation Needs: Not on file  Physical Activity: Not on file  Stress: Not on file  Social Connections: Not on file  Intimate Partner Violence: Not on file    Current Outpatient Medications on File Prior to Visit  Medication Sig Dispense Refill  .  acarbose (PRECOSE) 25 MG tablet Take 1 tablet (25 mg total) by mouth 3 (three) times daily with meals. 90 tablet 11  . amLODipine-valsartan (EXFORGE) 10-320 MG tablet TAKE 1 TABLET BY MOUTH EVERY DAY 90 tablet 3  . ANORO ELLIPTA 62.5-25 MCG/INH AEPB INHALE 1 PUFF INTO THE LUNGS EVERY DAY 180 each 3  . aspirin EC 81 MG tablet Take 81 mg by mouth daily.    . B-D ULTRAFINE III SHORT PEN 31G X 8 MM MISC USE AS DIRECTED THREE TIMES DAILY 100 each 0  . Cholecalciferol (VITAMIN D3) 1000 UNITS CAPS Take 1 capsule by mouth daily.      . fluticasone (FLONASE) 50 MCG/ACT nasal spray SPRAY 2 SPRAYS INTO EACH NOSTRIL EVERY DAY 48 mL 1  . insulin glargine, 2 Unit Dial, (TOUJEO MAX SOLOSTAR) 300 UNIT/ML Solostar Pen Inject 74 Units into the skin every morning. And pen needles 1/day. 30 mL 3  . ketorolac (TORADOL) 10 MG tablet Take 1 tablet (10 mg total) by mouth every 8 (eight) hours as needed for severe pain (renal colic). 20 tablet 0  . lovastatin (MEVACOR) 20 MG tablet TAKE 1 TABLET BY MOUTH EVERY DAY 90 tablet 3  . metFORMIN (GLUCOPHAGE-XR) 500 MG 24 hr tablet Take 1 tablet (500 mg total) by  mouth daily with breakfast. 90 tablet 3  . morphine (MS CONTIN) 100 MG 12 hr tablet Take 2 tablets (200 mg total) by mouth 3 (three) times daily. 180 tablet 0  . morphine (MS CONTIN) 100 MG 12 hr tablet Take 2 tablets (200 mg total) by mouth 3 (three) times daily. 180 tablet 0  . morphine (MS CONTIN) 100 MG 12 hr tablet Take 2 tablets (200 mg total) by mouth 3 (three) times daily. 180 tablet 0  . morphine (MSIR) 15 MG tablet Take 1 tablet (15 mg total) by mouth 2 (two) times daily as needed for severe pain. 60 tablet 0  . morphine (MSIR) 15 MG tablet Take 1 tablet (15 mg total) by mouth 2 (two) times daily as needed for severe pain. 60 tablet 0  . morphine (MSIR) 15 MG tablet Take 1 tablet (15 mg total) by mouth 2 (two) times daily as needed for severe pain. 60 tablet 0  . OneTouch Delica Lancets 33G MISC USE TO CHECK  BLOOD SUGAR TWICE DAILY 100 each 3  . ONETOUCH ULTRA test strip test TWICE DAILY 100 strip 0  . Semaglutide (RYBELSUS) 14 MG TABS Take 14 mg by mouth daily. 90 tablet 3   No current facility-administered medications on file prior to visit.    Allergies  Allergen Reactions  . Belviq [Lorcaserin Hcl]     wierd  . Benazepril Hcl     REACTION: cough  . Nsaids     Can take short term Upset stomach    Family History  Problem Relation Age of Onset  . Hypertension Mother   . Diabetes Mother   . CAD Mother   . Heart disease Other        CAD male relative <60  . Vision loss Sister     BP 140/70 (BP Location: Right Arm, Patient Position: Sitting, Cuff Size: Large)   Pulse (!) 42   Ht 5\' 10"  (1.778 m)   Wt 251 lb 3.2 oz (113.9 kg)   SpO2 96%   BMI 36.04 kg/m    Review of Systems Denies dizziness/LOC    Objective:   Physical Exam VITAL SIGNS:  See vs page GENERAL: no distress HEART: normal rate but irreg rhythm without murmurs noted. Normal S1,S2.   Pulses: dorsalis pedis absent bilat (poss due to edema).    MSK: no deformity of the feet CV: 2+ bilat leg edema Skin:  no ulcer on the feet.  normal color and temp on the feet.  Neuro: sensation is intact to touch on the feet.   Ext: there is bilateral onychomycosis of the toenails.     Lab Results  Component Value Date   HGBA1C 7.7 (A) 11/06/2020   Lab Results  Component Value Date   CREATININE 0.79 10/28/2018   BUN 11 10/28/2018   NA 133 (L) 10/28/2018   K 4.0 10/28/2018   CL 97 10/28/2018   CO2 29 10/28/2018      Assessment & Plan:  Insulin-requiring type 2 DM, with CAD: this is the best control this pt should aim for, given this regimen, which does match insulin to his changing needs throughout the day Bradycardia: asymptomatic.  Patient Instructions  Please let me know if an alternative to Toujeo (glargine, Semglee, or Basaglar) is cheaper, and i'll send a new prescription Please continue the same 4  diabetes medications.   check your blood sugar twice a day.  vary the time of day when you check, between before the 3  meals, and at bedtime.  also check if you have symptoms of your blood sugar being too high or too low.  please keep a record of the readings and bring it to your next appointment here.  You can write it on any piece of paper.  please call us sooner if your blood sugar goes below 70, or if you have a lot of readings over 200.   On this type of insulin schedule, you should eat meals on a regular schedule (especially lunch).  If a meal is missed or significantly delayed, your blood sugar could go low.   I have asked Dr Plotnikov's office to call you to set up and EKG.  you will receive a phone call, about a day and time for an appointment Please come back for a follow-up appointment in 4 months.

## 2020-11-09 ENCOUNTER — Telehealth: Payer: Self-pay | Admitting: Internal Medicine

## 2020-11-09 NOTE — Telephone Encounter (Signed)
Per Dr. Everardo All: HR is 42 here today.  On exam, normal rate, but irreg rhythm.  No sxs.  Our ECG machine is not working, so can your office call him to have an ECG there soon?   Lucy, Please schedule the patient with any provider for an office visit/EKG for bradycardia. Thanks, AP

## 2020-11-09 NOTE — Telephone Encounter (Signed)
Called pt there was no answer LMOM RTC for appt.Marland KitchenRaechel Chute

## 2020-11-12 ENCOUNTER — Ambulatory Visit: Payer: No Typology Code available for payment source | Admitting: Internal Medicine

## 2020-11-12 ENCOUNTER — Other Ambulatory Visit: Payer: Self-pay

## 2020-11-12 ENCOUNTER — Encounter: Payer: Self-pay | Admitting: Internal Medicine

## 2020-11-12 VITALS — BP 130/90 | HR 96 | Temp 98.1°F | Resp 18 | Ht 70.0 in | Wt 251.6 lb

## 2020-11-12 DIAGNOSIS — R001 Bradycardia, unspecified: Secondary | ICD-10-CM | POA: Diagnosis not present

## 2020-11-12 DIAGNOSIS — I10 Essential (primary) hypertension: Secondary | ICD-10-CM | POA: Diagnosis not present

## 2020-11-12 NOTE — Telephone Encounter (Signed)
Per chart pt called back and made appt w/Dr. Okey Dupre for today 11/12/20.Marland KitchenRaechel Chute

## 2020-11-12 NOTE — Progress Notes (Unsigned)
   Subjective:   Patient ID: [name] [name], male    DOB: 07-24-1966, 55 y.o.   MRN: [id number]  HPI The patient is a 55 YO man coming in for asymptomatic bradycardia at his endocrine doctor's office recently. He does take amlodipine/valsartan for BP and this has not been changed or increased recently. He is not on beta blocker. Last EKG from 2019 with cardiology without abnormality and rate normal then. He is a current smoker. Denies chest pains or SOB. Does have diabetes last HgA1c 7.7 which is improved from prior. He does not have symptoms such as dizziness, lightheadedness, syncope. He was not feeling badly in office with HR 42. Denies palpitations.  Review of Systems  Constitutional: Negative.   HENT: Negative.   Eyes: Negative.   Respiratory: Negative for cough, chest tightness and shortness of breath.   Cardiovascular: Negative for chest pain, palpitations and leg swelling.  Gastrointestinal: Negative for abdominal distention, abdominal pain, constipation, diarrhea, nausea and vomiting.  Musculoskeletal: Negative.   Skin: Negative.   Neurological: Negative.   Psychiatric/Behavioral: Negative.     Objective:  Physical Exam Constitutional:      Appearance: He is well-developed. He is obese.  HENT:     Head: Normocephalic and atraumatic.  Cardiovascular:     Rate and Rhythm: Normal rate and regular rhythm.  Pulmonary:     Effort: Pulmonary effort is normal. No respiratory distress.     Breath sounds: Normal breath sounds. No wheezing or rales.  Abdominal:     General: Bowel sounds are normal. There is no distension.     Palpations: Abdomen is soft.     Tenderness: There is no abdominal tenderness. There is no rebound.  Musculoskeletal:     Cervical back: Normal range of motion.  Skin:    General: Skin is warm and dry.  Neurological:     Mental Status: He is alert and oriented to person, place, and time.     Coordination: Coordination normal.     Vitals:   11/12/20  1509  BP: 130/90  Pulse: 96  Resp: 18  Temp: 98.1 F (36.7 C)  TempSrc: Oral  SpO2: 99%  Weight: 251 lb 9.6 oz (114.1 kg)  Height: 5\' 10"  (1.778 m)   EKG: Rate 87, axis normal, interval normal, sinus, frequent PVCs, no st or t wave changes, mild LVH, new LVH and PVCs 2019  This visit occurred during the SARS-CoV-2 public health emergency.  Safety protocols were in place, including screening questions prior to the visit, additional usage of staff PPE, and extensive cleaning of exam room while observing appropriate contact time as indicated for disinfecting solutions.   Assessment & Plan:

## 2020-11-12 NOTE — Patient Instructions (Signed)
Your EKG is normal and has a few early heart beats.   Work on stopping smoking for your health.

## 2020-11-13 DIAGNOSIS — R001 Bradycardia, unspecified: Secondary | ICD-10-CM | POA: Insufficient documentation

## 2020-11-13 NOTE — Assessment & Plan Note (Signed)
New LVH on EKG from 2019 and counseled about high risk for CV disease given smoking and diabetes concurrent and he should push for aggressive BP goal of 130/80 if possible. He is also asked to quit smoking.

## 2020-11-13 NOTE — Assessment & Plan Note (Addendum)
He had HR reported 42 from his endocrinologist office. HR is upper limit of normal today and there are some PVCs. Patient without symptoms of these. Is on calcium channel blocker which can be stopped if persistent bradycardia. He is asked to monitor HR with BP as he has ability to check this at home. If <60 call office. Can order holter monitor. He did not have symptoms associated with the bradycardia at endo office and this may have been spurious with frequent PVCs if done automated.

## 2020-11-15 ENCOUNTER — Other Ambulatory Visit: Payer: Self-pay | Admitting: Internal Medicine

## 2020-11-28 ENCOUNTER — Other Ambulatory Visit: Payer: Self-pay | Admitting: Endocrinology

## 2020-11-28 DIAGNOSIS — E1165 Type 2 diabetes mellitus with hyperglycemia: Secondary | ICD-10-CM

## 2020-12-06 ENCOUNTER — Telehealth: Payer: Self-pay

## 2020-12-06 NOTE — Telephone Encounter (Signed)
Prior Auth key: [id number]  Morphine Sulfate ER 100mg  was denied.   Please Advise

## 2020-12-06 NOTE — Telephone Encounter (Signed)
PA was denied, called pt to notify him of the decision.

## 2020-12-06 NOTE — Telephone Encounter (Signed)
Pls inform the pt Thx 

## 2020-12-25 ENCOUNTER — Other Ambulatory Visit: Payer: Self-pay | Admitting: Endocrinology

## 2021-01-01 ENCOUNTER — Telehealth: Payer: Self-pay | Admitting: *Deleted

## 2021-01-01 NOTE — Telephone Encounter (Signed)
Rec'd PA for pt Morphine sulfate 100 mg. Completed via cover-my-meds w/  (Key: [id number]). Rec'd msg stating " [employer] is reviewing your PA request. Typically an electronic response will be received within 24-72 hours."...Raechel Chute

## 2021-01-03 NOTE — Telephone Encounter (Signed)
Check status on PA received msg statting "  We received your request for prior authorization forMORPHINE SULFATE ER , for the above member; however, [employer] has a denied request on file for MORPHINE SULFATE ER for this member. "..Raechel Chute

## 2021-01-24 ENCOUNTER — Other Ambulatory Visit: Payer: Self-pay | Admitting: Endocrinology

## 2021-01-24 DIAGNOSIS — E1165 Type 2 diabetes mellitus with hyperglycemia: Secondary | ICD-10-CM

## 2021-01-29 ENCOUNTER — Other Ambulatory Visit: Payer: Self-pay

## 2021-01-30 ENCOUNTER — Encounter: Payer: Self-pay | Admitting: Internal Medicine

## 2021-01-30 ENCOUNTER — Ambulatory Visit: Payer: No Typology Code available for payment source | Admitting: Internal Medicine

## 2021-01-30 DIAGNOSIS — E1165 Type 2 diabetes mellitus with hyperglycemia: Secondary | ICD-10-CM | POA: Diagnosis not present

## 2021-01-30 DIAGNOSIS — M545 Low back pain, unspecified: Secondary | ICD-10-CM | POA: Diagnosis not present

## 2021-01-30 DIAGNOSIS — I2510 Atherosclerotic heart disease of native coronary artery without angina pectoris: Secondary | ICD-10-CM | POA: Diagnosis not present

## 2021-01-30 DIAGNOSIS — J449 Chronic obstructive pulmonary disease, unspecified: Secondary | ICD-10-CM

## 2021-01-30 DIAGNOSIS — Z6837 Body mass index (BMI) 37.0-37.9, adult: Secondary | ICD-10-CM

## 2021-01-30 DIAGNOSIS — I2583 Coronary atherosclerosis due to lipid rich plaque: Secondary | ICD-10-CM

## 2021-01-30 DIAGNOSIS — M79605 Pain in left leg: Secondary | ICD-10-CM

## 2021-01-30 DIAGNOSIS — M79604 Pain in right leg: Secondary | ICD-10-CM

## 2021-01-30 MED ORDER — MORPHINE SULFATE 15 MG PO TABS: 15.0000 mg | ORAL_TABLET | Freq: Two times a day (BID) | ORAL | 0 refills | Status: DC | PRN

## 2021-01-30 MED ORDER — MORPHINE SULFATE ER 100 MG PO TBCR: 200.0000 mg | EXTENDED_RELEASE_TABLET | Freq: Three times a day (TID) | ORAL | 0 refills | Status: DC

## 2021-01-30 NOTE — Assessment & Plan Note (Signed)
Stable.  Continue with lovastatin

## 2021-01-30 NOTE — Progress Notes (Signed)
Subjective:  Patient ID: IZSAK MEIR, male    DOB: October 12, 1965  Age: 55 y.o. MRN: [id number]  CC: Follow-up (3 month f/u)   HPI GIOVANIE LEFEBRE presents for chronic low back pain, diabetes, dyslipidemia  Outpatient Medications Prior to Visit  Medication Sig Dispense Refill  . acarbose (PRECOSE) 25 MG tablet Take 1 tablet (25 mg total) by mouth 3 (three) times daily with meals. 90 tablet 11  . amLODipine-valsartan (EXFORGE) 10-320 MG tablet TAKE 1 TABLET BY MOUTH EVERY DAY 90 tablet 3  . ANORO ELLIPTA 62.5-25 MCG/INH AEPB INHALE 1 PUFF INTO THE LUNGS EVERY DAY 180 each 3  . aspirin EC 81 MG tablet Take 81 mg by mouth daily.    . B-D ULTRAFINE III SHORT PEN 31G X 8 MM MISC USE AS DIRECTED THREE TIMES DAILY 100 each 0  . Cholecalciferol (VITAMIN D3) 1000 UNITS CAPS Take 1 capsule by mouth daily.      . fluticasone (FLONASE) 50 MCG/ACT nasal spray SPRAY 2 SPRAYS INTO EACH NOSTRIL EVERY DAY 48 mL 1  . insulin glargine, 2 Unit Dial, (TOUJEO MAX SOLOSTAR) 300 UNIT/ML Solostar Pen Inject 74 Units into the skin every morning. And pen needles 1/day. 30 mL 3  . Lancets (ONETOUCH DELICA PLUS LANCET33G) MISC USE TO check blood sugar TWICE DAILY 100 each 3  . lovastatin (MEVACOR) 20 MG tablet TAKE 1 TABLET BY MOUTH EVERY DAY 90 tablet 3  . metFORMIN (GLUCOPHAGE-XR) 500 MG 24 hr tablet TAKE ONE TABLET BY MOUTH EVERY MORNING with BREAKFAST 90 tablet 2  . morphine (MS CONTIN) 100 MG 12 hr tablet Take 2 tablets (200 mg total) by mouth 3 (three) times daily. 180 tablet 0  . morphine (MSIR) 15 MG tablet Take 1 tablet (15 mg total) by mouth 2 (two) times daily as needed for severe pain. 60 tablet 0  . ONETOUCH ULTRA test strip USE TO test TWICE DAILY 100 strip 0  . Semaglutide (RYBELSUS) 14 MG TABS Take 14 mg by mouth daily. 90 tablet 3  . morphine (MS CONTIN) 100 MG 12 hr tablet Take 2 tablets (200 mg total) by mouth 3 (three) times daily. 180 tablet 0  . morphine (MS CONTIN) 100 MG 12 hr tablet Take  2 tablets (200 mg total) by mouth 3 (three) times daily. 180 tablet 0  . morphine (MSIR) 15 MG tablet Take 1 tablet (15 mg total) by mouth 2 (two) times daily as needed for severe pain. 60 tablet 0  . morphine (MSIR) 15 MG tablet Take 1 tablet (15 mg total) by mouth 2 (two) times daily as needed for severe pain. 60 tablet 0  . ketorolac (TORADOL) 10 MG tablet Take 1 tablet (10 mg total) by mouth every 8 (eight) hours as needed for severe pain (renal colic). (Patient not taking: Reported on 01/30/2021) 20 tablet 0   No facility-administered medications prior to visit.    ROS: Review of Systems  Constitutional: Positive for unexpected weight change. Negative for appetite change and fatigue.  HENT: Negative for congestion, nosebleeds, sneezing, sore throat and trouble swallowing.   Eyes: Negative for itching and visual disturbance.  Respiratory: Negative for cough.   Cardiovascular: Negative for chest pain, palpitations and leg swelling.  Gastrointestinal: Negative for abdominal distention, blood in stool, diarrhea and nausea.  Genitourinary: Negative for frequency and hematuria.  Musculoskeletal: Positive for back pain. Negative for arthralgias, gait problem, joint swelling and neck pain.  Skin: Negative for rash.  Neurological: Negative for dizziness,  tremors, speech difficulty and weakness.  Psychiatric/Behavioral: Negative for agitation, dysphoric mood and sleep disturbance. The patient is not nervous/anxious.     Objective:  BP (!) 142/60 (BP Location: Left Arm)   Pulse (!) 42   Temp 98.4 F (36.9 C) (Oral)   Ht 5\' 10"  (1.778 m)   Wt 245 lb 9.6 oz (111.4 kg)   SpO2 98%   BMI 35.24 kg/m   BP Readings from Last 3 Encounters:  01/30/21 (!) 142/60  11/12/20 130/90  11/06/20 140/70    Wt Readings from Last 3 Encounters:  01/30/21 245 lb 9.6 oz (111.4 kg)  11/12/20 251 lb 9.6 oz (114.1 kg)  11/06/20 251 lb 3.2 oz (113.9 kg)    Physical Exam Constitutional:      General: He  is not in acute distress.    Appearance: He is well-developed. He is obese.     Comments: NAD  Eyes:     Conjunctiva/sclera: Conjunctivae normal.     Pupils: Pupils are equal, round, and reactive to light.  Neck:     Thyroid: No thyromegaly.     Vascular: No JVD.  Cardiovascular:     Rate and Rhythm: Normal rate and regular rhythm.     Heart sounds: Normal heart sounds. No murmur heard. No friction rub. No gallop.   Pulmonary:     Effort: Pulmonary effort is normal. No respiratory distress.     Breath sounds: Normal breath sounds. No wheezing or rales.  Chest:     Chest wall: No tenderness.  Abdominal:     General: Bowel sounds are normal. There is no distension.     Palpations: Abdomen is soft. There is no mass.     Tenderness: There is no abdominal tenderness. There is no guarding or rebound.  Musculoskeletal:        General: Tenderness present. Normal range of motion.     Cervical back: Normal range of motion.  Lymphadenopathy:     Cervical: No cervical adenopathy.  Skin:    General: Skin is warm and dry.     Findings: No rash.  Neurological:     Mental Status: He is alert and oriented to person, place, and time.     Cranial Nerves: No cranial nerve deficit.     Motor: No abnormal muscle tone.     Coordination: Coordination normal.     Gait: Gait normal.     Deep Tendon Reflexes: Reflexes are normal and symmetric.  Psychiatric:        Behavior: Behavior normal.        Thought Content: Thought content normal.        Judgment: Judgment normal.   LS spine w/pain Bent forward posture  Lab Results  Component Value Date   WBC 10.3 04/23/2018   HGB 15.0 04/23/2018   HCT 44.0 04/23/2018   PLT 206.0 04/23/2018   GLUCOSE 231 (H) 10/28/2018   CHOL 127 04/23/2018   TRIG 86.0 04/23/2018   HDL 44.70 04/23/2018   LDLDIRECT 116.6 05/20/2007   LDLCALC 66 04/23/2018   ALT 33 04/23/2018   AST 24 04/23/2018   NA 133 (L) 10/28/2018   K 4.0 10/28/2018   CL 97 10/28/2018    CREATININE 0.79 10/28/2018   BUN 11 10/28/2018   CO2 29 10/28/2018   TSH 2.64 04/23/2018   PSA 0.20 04/23/2018   INR 1.1 05/10/2008   HGBA1C 7.7 (A) 11/06/2020   MICROALBUR 1.7 04/23/2018    VAS 04/25/2018 CAROTID  Result  Date: 12/01/2019 Carotid Arterial Duplex Study Indications:  Mild bilateral bruits. Risk Factors: Hypertension, hyperlipidemia, Diabetes, current smoker, coronary               artery disease. Performing Technologist: Olegario Shearer RVT  Examination Guidelines: A complete evaluation includes B-mode imaging, spectral Doppler, color Doppler, and power Doppler as needed of all accessible portions of each vessel. Bilateral testing is considered an integral part of a complete examination. Limited examinations for reoccurring indications may be performed as noted.  Right Carotid Findings: +----------+--------+--------+--------+----------------------+--------+           PSV cm/sEDV cm/sStenosisPlaque Description    Comments +----------+--------+--------+--------+----------------------+--------+ CCA Prox  89      12                                             +----------+--------+--------+--------+----------------------+--------+ CCA Distal64      16                                             +----------+--------+--------+--------+----------------------+--------+ ICA Prox  52      14              focal and heterogenous         +----------+--------+--------+--------+----------------------+--------+ ICA Mid   55      22      1-39%                                  +----------+--------+--------+--------+----------------------+--------+ ICA Distal97      27                                    tortuous +----------+--------+--------+--------+----------------------+--------+ ECA       95      17                                             +----------+--------+--------+--------+----------------------+--------+  +----------+--------+-------+----------------+-------------------+           PSV cm/sEDV cmsDescribe        Arm Pressure (mmHG) +----------+--------+-------+----------------+-------------------+ Subclavian119            Multiphasic, [id number]                 +----------+--------+-------+----------------+-------------------+ +---------+--------+--+--------+--+---------+ VertebralPSV cm/s44EDV cm/s10Antegrade +---------+--------+--+--------+--+---------+  Left Carotid Findings: +----------+--------+--------+--------+----------------------+--------+           PSV cm/sEDV cm/sStenosisPlaque Description    Comments +----------+--------+--------+--------+----------------------+--------+ CCA Prox  94      18                                             +----------+--------+--------+--------+----------------------+--------+ CCA Distal58      18                                             +----------+--------+--------+--------+----------------------+--------+  ICA Prox  73      22      1-39%   focal and heterogenous         +----------+--------+--------+--------+----------------------+--------+ ICA Mid   70      26                                             +----------+--------+--------+--------+----------------------+--------+ ICA Distal101     28                                             +----------+--------+--------+--------+----------------------+--------+ ECA       110     21                                             +----------+--------+--------+--------+----------------------+--------+ +----------+--------+--------+----------------+-------------------+           PSV cm/sEDV cm/sDescribe        Arm Pressure (mmHG) +----------+--------+--------+----------------+-------------------+ [id number]             Multiphasic, [id number]                 +----------+--------+--------+----------------+-------------------+  +---------+--------+--+--------+--+---------+ VertebralPSV cm/s47EDV cm/s14Antegrade +---------+--------+--+--------+--+---------+   Summary: Right Carotid: Velocities in the right ICA are consistent with a 1-39% stenosis. Left Carotid: Velocities in the left ICA are consistent with a 1-39% stenosis. Vertebrals:  Bilateral vertebral arteries demonstrate antegrade flow. Subclavians: Normal flow hemodynamics were seen in bilateral subclavian              arteries. *See table(s) above for measurements and observations.  Electronically signed by Charlton HawsPeter Nishan MD on 12/01/2019 at 5:00:21 PM.    Final     Assessment & Plan:    Sonda PrimesAlex Tamel Abel, MD

## 2021-01-30 NOTE — Assessment & Plan Note (Signed)
Stable.  Continue with Humalog, Basaglar, Rybelsus.  Continue with regular appointment with Dr. Everardo All.  He is losing weight on Rybelsus

## 2021-01-30 NOTE — Assessment & Plan Note (Signed)
Stable.  [name] [name]'s lungs are clear now

## 2021-01-30 NOTE — Assessment & Plan Note (Signed)
Chronic ongoing severe LBP, on opioids Continue with MSIR, MSContin   Potential benefits of a long term opioids  use as well as potential risks  and complications were explained to the patient and were aknowledged. He is maintaining stooped over posture when standing/walking

## 2021-01-30 NOTE — Assessment & Plan Note (Signed)
[name] [name] has been losing weight on Rybelsus

## 2021-02-22 ENCOUNTER — Other Ambulatory Visit: Payer: Self-pay | Admitting: Endocrinology

## 2021-03-12 ENCOUNTER — Ambulatory Visit: Payer: No Typology Code available for payment source | Admitting: Endocrinology

## 2021-03-12 ENCOUNTER — Other Ambulatory Visit: Payer: Self-pay

## 2021-03-12 VITALS — BP 156/80 | HR 41 | Ht 69.0 in | Wt 246.6 lb

## 2021-03-12 DIAGNOSIS — E1165 Type 2 diabetes mellitus with hyperglycemia: Secondary | ICD-10-CM

## 2021-03-12 LAB — POCT GLYCOSYLATED HEMOGLOBIN (HGB A1C): Hemoglobin A1C: 7.5 % — AB (ref 4.0–5.6)

## 2021-03-12 MED ORDER — TOUJEO MAX SOLOSTAR 300 UNIT/ML ~~LOC~~ SOPN: 68.0000 [IU] | PEN_INJECTOR | SUBCUTANEOUS | 3 refills | Status: DC

## 2021-03-12 MED ORDER — EMPAGLIFLOZIN 10 MG PO TABS: 10.0000 mg | ORAL_TABLET | Freq: Every day | ORAL | 3 refills | Status: DC

## 2021-03-12 NOTE — Progress Notes (Signed)
Subjective:    Patient ID: [name] [name], male    DOB: 1966-02-24, 55 y.o.   MRN: [id number]  HPI Pt returns for f/u of diabetes mellitus:  DM type: Insulin-requiring type 2.   Dx'ed: 2005 Complications: CAD Therapy: insulin since 2007, and 3 oral meds.   DKA: never Severe hypoglycemia: never.  Pancreatitis: never.   Other: he takes QD insulin, due to h/o noncompliance; he works M-F, as a Nutritional therapist; edema limited rx options; He says cbg's on work days=days off.   Interval history: no cbg record, but states cbg's vary from 64-160.  He seldom has hypoglycemia, and these episodes are mild.  It is in general lowest fasting  He says he never misses meds.    Past Medical History:  Diagnosis Date   COPD (chronic obstructive pulmonary disease) (HCC)    Diabetes mellitus    GERD (gastroesophageal reflux disease)    Hyperlipidemia    Hypertension    Lower back pain    Nephrolithiasis    Dr Annabell Howells    Past Surgical History:  Procedure Laterality Date   KNEE SURGERY      Social History   Socioeconomic History   Marital status: Married    Spouse name: Not on file   Number of children: 2   Years of education: Not on file   Highest education level: Not on file  Occupational History   Not on file  Tobacco Use   Smoking status: Every Day    Packs/day: 2.00    Types: Cigarettes   Smokeless tobacco: Never  Substance and Sexual Activity   Alcohol use: No   Drug use: No   Sexual activity: Yes  Other Topics Concern   Not on file  Social History Narrative   No regular exercise   Social Determinants of Health   Financial Resource Strain: Not on file  Food Insecurity: Not on file  Transportation Needs: Not on file  Physical Activity: Not on file  Stress: Not on file  Social Connections: Not on file  Intimate Partner Violence: Not on file    Current Outpatient Medications on File Prior to Visit  Medication Sig Dispense Refill   acarbose (PRECOSE) 25 MG tablet Take 1  tablet (25 mg total) by mouth 3 (three) times daily with meals. 90 tablet 11   amLODipine-valsartan (EXFORGE) 10-320 MG tablet TAKE 1 TABLET BY MOUTH EVERY DAY 90 tablet 3   ANORO ELLIPTA 62.5-25 MCG/INH AEPB INHALE 1 PUFF INTO THE LUNGS EVERY DAY 180 each 3   aspirin EC 81 MG tablet Take 81 mg by mouth daily.     B-D ULTRAFINE III SHORT PEN 31G X 8 MM MISC USE AS DIRECTED THREE TIMES DAILY 100 each 0   Cholecalciferol (VITAMIN D3) 1000 UNITS CAPS Take 1 capsule by mouth daily.       fluticasone (FLONASE) 50 MCG/ACT nasal spray SPRAY 2 SPRAYS INTO EACH NOSTRIL EVERY DAY 48 mL 1   Lancets (ONETOUCH DELICA PLUS LANCET33G) MISC USE TO check blood sugar TWICE DAILY 100 each 3   lovastatin (MEVACOR) 20 MG tablet TAKE 1 TABLET BY MOUTH EVERY DAY 90 tablet 3   metFORMIN (GLUCOPHAGE-XR) 500 MG 24 hr tablet TAKE ONE TABLET BY MOUTH EVERY MORNING with BREAKFAST 90 tablet 2   morphine (MS CONTIN) 100 MG 12 hr tablet Take 2 tablets (200 mg total) by mouth 3 (three) times daily. 180 tablet 0   morphine (MS CONTIN) 100 MG 12 hr tablet Take 2  tablets (200 mg total) by mouth 3 (three) times daily. 180 tablet 0   morphine (MS CONTIN) 100 MG 12 hr tablet Take 2 tablets (200 mg total) by mouth 3 (three) times daily. 180 tablet 0   morphine (MSIR) 15 MG tablet Take 1 tablet (15 mg total) by mouth 2 (two) times daily as needed for severe pain. 60 tablet 0   morphine (MSIR) 15 MG tablet Take 1 tablet (15 mg total) by mouth 2 (two) times daily as needed for severe pain. 60 tablet 0   morphine (MSIR) 15 MG tablet Take 1 tablet (15 mg total) by mouth 2 (two) times daily as needed for severe pain. 60 tablet 0   ONETOUCH ULTRA test strip USE TO test TWICE DAILY 100 strip 0   Semaglutide (RYBELSUS) 14 MG TABS Take 14 mg by mouth daily. 90 tablet 3   No current facility-administered medications on file prior to visit.    Allergies  Allergen Reactions   Belviq [Lorcaserin Hcl]     wierd   Benazepril Hcl     REACTION:  cough   Nsaids     Can take short term Upset stomach    Family History  Problem Relation Age of Onset   Hypertension Mother    Diabetes Mother    CAD Mother    Heart disease Other        CAD male relative <60   Vision loss Sister     BP (!) 156/80   Pulse (!) 41   Ht 5\' 9"  (1.753 m)   Wt 246 lb 9.6 oz (111.9 kg)   SpO2 99%   BMI 36.42 kg/m    Review of Systems     Objective:   Physical Exam HEART: normal rate but irreg rhythm without murmurs noted. Normal S1,S2.   Pulses: dorsalis pedis absent bilat (poss due to edema).    MSK: no deformity of the feet CV: 2+ bilat leg edema Skin:  no ulcer on the feet.  normal color and temp on the feet.  Neuro: sensation is intact to touch on the feet.   Ext: there is bilateral onychomycosis of the toenails.     Lab Results  Component Value Date   HGBA1C 7.5 (A) 03/12/2021   Lab Results  Component Value Date   CREATININE 0.79 10/28/2018   BUN 11 10/28/2018   NA 133 (L) 10/28/2018   K 4.0 10/28/2018   CL 97 10/28/2018   CO2 29 10/28/2018     Assessment & Plan:  Insulin-requiring type 2 DM: uncontrolled.  Hypoglycemia, due to insulin: we'll favor SGLT  Patient Instructions  I have sent a prescription to your pharmacy, to add Jardiance, and:  Reduce the Toujeo to 68 units each morning, and: Please continue the same 3 other diabetes medications.   check your blood sugar twice a day.  vary the time of day when you check, between before the 3 meals, and at bedtime.  also check if you have symptoms of your blood sugar being too high or too low.  please keep a record of the readings and bring it to your next appointment here.  You can write it on any piece of paper.  please call 10/30/2018 sooner if your blood sugar goes below 70, or if you have a lot of readings over 200.   On this type of insulin schedule, you should eat meals on a regular schedule (especially lunch).  If a meal is missed or significantly delayed, your  blood sugar  could go low.   Please come back for a follow-up appointment in 4 months.

## 2021-03-12 NOTE — Patient Instructions (Addendum)
I have sent a prescription to your pharmacy, to add Jardiance, and:  Reduce the Toujeo to 68 units each morning, and: Please continue the same 3 other diabetes medications.   check your blood sugar twice a day.  vary the time of day when you check, between before the 3 meals, and at bedtime.  also check if you have symptoms of your blood sugar being too high or too low.  please keep a record of the readings and bring it to your next appointment here.  You can write it on any piece of paper.  please call us sooner if your blood sugar goes below 70, or if you have a lot of readings over 200.   On this type of insulin schedule, you should eat meals on a regular schedule (especially lunch).  If a meal is missed or significantly delayed, your blood sugar could go low.   Please come back for a follow-up appointment in 4 months.

## 2021-03-26 ENCOUNTER — Telehealth: Payer: Self-pay | Admitting: Internal Medicine

## 2021-03-26 ENCOUNTER — Other Ambulatory Visit: Payer: Self-pay | Admitting: Endocrinology

## 2021-03-26 NOTE — Telephone Encounter (Signed)
April from [facility] has called in regards to the patient not being able to refill his MORPHINE until July 31st but that is a Sunday and they are closed. Can they refill it on the 30th or do they have to wait untl the 1st?   Please advise- [phone] EXT 2 ([facility] location)

## 2021-03-27 NOTE — Telephone Encounter (Signed)
Notified pharmacy spoke w/ Denny Peon gave Md response ok to fill on the 30th.Marland KitchenRaechel Chute  Plotnikov, Georgina Quint, MD 10 hours ago (10:44 PM)     Okay to fill on the 30th. Thanks

## 2021-04-08 ENCOUNTER — Other Ambulatory Visit: Payer: Self-pay | Admitting: Endocrinology

## 2021-04-08 DIAGNOSIS — E1165 Type 2 diabetes mellitus with hyperglycemia: Secondary | ICD-10-CM

## 2021-04-23 ENCOUNTER — Telehealth: Payer: Self-pay | Admitting: Internal Medicine

## 2021-04-23 NOTE — Telephone Encounter (Signed)
Per chart med has not been refilled since 2020. Pls advise.Marland KitchenRaechel Chute

## 2021-04-24 MED ORDER — LOVASTATIN 20 MG PO TABS: 20.0000 mg | ORAL_TABLET | Freq: Every day | ORAL | 3 refills | Status: DC

## 2021-04-24 NOTE — Telephone Encounter (Signed)
MD sent../l,mb

## 2021-04-24 NOTE — Telephone Encounter (Signed)
Okay.  Thanks.

## 2021-04-30 ENCOUNTER — Ambulatory Visit: Payer: No Typology Code available for payment source | Admitting: Internal Medicine

## 2021-04-30 ENCOUNTER — Other Ambulatory Visit: Payer: Self-pay

## 2021-04-30 ENCOUNTER — Encounter: Payer: Self-pay | Admitting: Internal Medicine

## 2021-04-30 VITALS — BP 144/70 | HR 46 | Temp 98.4°F | Ht 69.0 in | Wt 241.8 lb

## 2021-04-30 DIAGNOSIS — M79604 Pain in right leg: Secondary | ICD-10-CM | POA: Diagnosis not present

## 2021-04-30 DIAGNOSIS — M79605 Pain in left leg: Secondary | ICD-10-CM | POA: Diagnosis not present

## 2021-04-30 DIAGNOSIS — I2510 Atherosclerotic heart disease of native coronary artery without angina pectoris: Secondary | ICD-10-CM | POA: Diagnosis not present

## 2021-04-30 DIAGNOSIS — L0390 Cellulitis, unspecified: Secondary | ICD-10-CM

## 2021-04-30 DIAGNOSIS — M545 Low back pain, unspecified: Secondary | ICD-10-CM | POA: Diagnosis not present

## 2021-04-30 DIAGNOSIS — R600 Localized edema: Secondary | ICD-10-CM

## 2021-04-30 DIAGNOSIS — E559 Vitamin D deficiency, unspecified: Secondary | ICD-10-CM

## 2021-04-30 DIAGNOSIS — E1165 Type 2 diabetes mellitus with hyperglycemia: Secondary | ICD-10-CM

## 2021-04-30 DIAGNOSIS — Z23 Encounter for immunization: Secondary | ICD-10-CM | POA: Diagnosis not present

## 2021-04-30 DIAGNOSIS — I2583 Coronary atherosclerosis due to lipid rich plaque: Secondary | ICD-10-CM

## 2021-04-30 DIAGNOSIS — E538 Deficiency of other specified B group vitamins: Secondary | ICD-10-CM

## 2021-04-30 DIAGNOSIS — Z6837 Body mass index (BMI) 37.0-37.9, adult: Secondary | ICD-10-CM

## 2021-04-30 MED ORDER — DOXYCYCLINE HYCLATE 100 MG PO TABS: 100.0000 mg | ORAL_TABLET | Freq: Two times a day (BID) | ORAL | 0 refills | Status: DC

## 2021-04-30 MED ORDER — MORPHINE SULFATE ER 100 MG PO TBCR: 200.0000 mg | EXTENDED_RELEASE_TABLET | Freq: Three times a day (TID) | ORAL | 0 refills | Status: DC

## 2021-04-30 MED ORDER — MORPHINE SULFATE 15 MG PO TABS: 15.0000 mg | ORAL_TABLET | Freq: Two times a day (BID) | ORAL | 0 refills | Status: DC | PRN

## 2021-04-30 MED ORDER — FUROSEMIDE 20 MG PO TABS: 20.0000 mg | ORAL_TABLET | Freq: Every day | ORAL | 3 refills | Status: DC | PRN

## 2021-04-30 NOTE — Assessment & Plan Note (Signed)
Continue with MSIR, MSContin   Potential benefits of a long term opioids  use as well as potential risks  and complications were explained to the patient and were aknowledged. He is maintaining stooped over posture when standing/walking

## 2021-04-30 NOTE — Patient Instructions (Addendum)
Go to ER if worse 

## 2021-04-30 NOTE — Assessment & Plan Note (Addendum)
Extensive new cellulitis R leg, L posterior arm/elbow Prescribed Doxy x 2 weeks To ER if worse Return to clinic in a couple days Will treat leg edema Wounds on the right lower leg were dressed with Band-Aid/antibiotic ointment

## 2021-04-30 NOTE — Progress Notes (Addendum)
Subjective:  Patient ID: [name] [name], male    DOB: Mar 15, 1966  Age: 55 y.o. MRN: [id number]  CC: Follow-up (3 month f/u)   HPI [name] [name] presents for LBP, DM, CAD C/o R foot and ankle painful rash for 1 week and L elbow painful redness, hurt x 2 d.  [name] [name] has been more scratching his ankle a lot.  He thought he had bug bites.  Outpatient Medications Prior to Visit  Medication Sig Dispense Refill   acarbose (PRECOSE) 25 MG tablet Take 1 tablet (25 mg total) by mouth 3 (three) times daily with meals. 90 tablet 11   amLODipine-valsartan (EXFORGE) 10-320 MG tablet TAKE 1 TABLET BY MOUTH EVERY DAY 90 tablet 3   ANORO ELLIPTA 62.5-25 MCG/INH AEPB INHALE 1 PUFF INTO THE LUNGS EVERY DAY 180 each 3   aspirin EC 81 MG tablet Take 81 mg by mouth daily.     B-D ULTRAFINE III SHORT PEN 31G X 8 MM MISC USE AS DIRECTED THREE TIMES DAILY 100 each 0   Cholecalciferol (VITAMIN D3) 1000 UNITS CAPS Take 1 capsule by mouth daily.       empagliflozin (JARDIANCE) 10 MG TABS tablet Take 1 tablet (10 mg total) by mouth daily before breakfast. 90 tablet 3   fluticasone (FLONASE) 50 MCG/ACT nasal spray SPRAY 2 SPRAYS INTO EACH NOSTRIL EVERY DAY 48 mL 1   insulin glargine, 2 Unit Dial, (TOUJEO MAX SOLOSTAR) 300 UNIT/ML Solostar Pen Inject 68 Units into the skin every morning. And pen needles 1/day. 72 mL 3   Lancets (ONETOUCH DELICA PLUS LANCET33G) MISC USE TO check blood sugar TWICE DAILY 100 each 3   lovastatin (MEVACOR) 20 MG tablet Take 1 tablet (20 mg total) by mouth daily. 90 tablet 3   metFORMIN (GLUCOPHAGE-XR) 500 MG 24 hr tablet TAKE ONE TABLET BY MOUTH EVERY MORNING with BREAKFAST 90 tablet 2   morphine (MS CONTIN) 100 MG 12 hr tablet Take 2 tablets (200 mg total) by mouth 3 (three) times daily. 180 tablet 0   morphine (MSIR) 15 MG tablet Take 1 tablet (15 mg total) by mouth 2 (two) times daily as needed for severe pain. 60 tablet 0   ONETOUCH ULTRA test strip USE TO TEST BLOOD SUGAR TWICE  DAILY 100 strip 0   RYBELSUS 14 MG TABS take ONE tablet daily 90 tablet 1   morphine (MS CONTIN) 100 MG 12 hr tablet Take 2 tablets (200 mg total) by mouth 3 (three) times daily. 180 tablet 0   morphine (MS CONTIN) 100 MG 12 hr tablet Take 2 tablets (200 mg total) by mouth 3 (three) times daily. 180 tablet 0   morphine (MSIR) 15 MG tablet Take 1 tablet (15 mg total) by mouth 2 (two) times daily as needed for severe pain. 60 tablet 0   morphine (MSIR) 15 MG tablet Take 1 tablet (15 mg total) by mouth 2 (two) times daily as needed for severe pain. 60 tablet 0   No facility-administered medications prior to visit.    ROS: Review of Systems  Constitutional:  Positive for chills and fatigue. Negative for appetite change and unexpected weight change.  HENT:  Negative for congestion, nosebleeds, sneezing, sore throat and trouble swallowing.   Eyes:  Negative for itching and visual disturbance.  Respiratory:  Negative for cough.   Cardiovascular:  Negative for chest pain, palpitations and leg swelling.  Gastrointestinal:  Negative for abdominal distention, blood in stool, diarrhea and nausea.  Genitourinary:  Negative  for frequency and hematuria.  Musculoskeletal:  Positive for arthralgias and gait problem. Negative for back pain, joint swelling and neck pain.  Skin:  Positive for color change, rash and wound.  Neurological:  Negative for dizziness, tremors, speech difficulty and weakness.  Hematological:  Negative for adenopathy. Bruises/bleeds easily.  Psychiatric/Behavioral:  Negative for agitation, dysphoric mood and sleep disturbance. The patient is not nervous/anxious.    Objective:  BP (!) 144/70 (BP Location: Left Arm)   Pulse (!) 46   Temp 98.4 F (36.9 C) (Oral)   Ht 5\' 9"  (1.753 m)   Wt 241 lb 12.8 oz (109.7 kg)   SpO2 97%   BMI 35.71 kg/m   BP Readings from Last 3 Encounters:  04/30/21 (!) 144/70  03/12/21 (!) 156/80  01/30/21 (!) 142/60    Wt Readings from Last 3  Encounters:  04/30/21 241 lb 12.8 oz (109.7 kg)  03/12/21 246 lb 9.6 oz (111.9 kg)  01/30/21 245 lb 9.6 oz (111.4 kg)    Physical Exam Constitutional:      General: He is not in acute distress.    Appearance: He is well-developed. He is obese.     Comments: NAD  Eyes:     Conjunctiva/sclera: Conjunctivae normal.     Pupils: Pupils are equal, round, and reactive to light.  Neck:     Thyroid: No thyromegaly.     Vascular: No JVD.  Cardiovascular:     Rate and Rhythm: Normal rate and regular rhythm.     Heart sounds: Normal heart sounds. No murmur heard.   No friction rub. No gallop.  Pulmonary:     Effort: Pulmonary effort is normal. No respiratory distress.     Breath sounds: Normal breath sounds. No wheezing or rales.  Chest:     Chest wall: No tenderness.  Abdominal:     General: Bowel sounds are normal. There is no distension.     Palpations: Abdomen is soft. There is no mass.     Tenderness: There is no abdominal tenderness. There is no guarding or rebound.  Musculoskeletal:        General: Tenderness present. Normal range of motion.     Cervical back: Normal range of motion.     Right lower leg: Edema present.  Lymphadenopathy:     Cervical: No cervical adenopathy.  Skin:    General: Skin is warm and dry.     Findings: Erythema and rash present.  Neurological:     Mental Status: He is alert and oriented to person, place, and time.     Cranial Nerves: No cranial nerve deficit.     Motor: No abnormal muscle tone.     Coordination: Coordination normal.     Gait: Gait abnormal.     Deep Tendon Reflexes: Reflexes are normal and symmetric.  Psychiatric:        Behavior: Behavior normal.        Thought Content: Thought content normal.        Judgment: Judgment normal.  Antalgic gait, he is stooped over Right ankle with trace to 1+ edema, erythema, warm skin and several erosions present.  Tender to touch Left posterior forearm with a area of erythema involving  posterior elbow.  There is probably minimal effusion present in the elbow bursa.  Tender to palpation  Lab Results  Component Value Date   WBC 10.3 04/23/2018   HGB 15.0 04/23/2018   HCT 44.0 04/23/2018   PLT 206.0 04/23/2018   GLUCOSE  231 (H) 10/28/2018   CHOL 127 04/23/2018   TRIG 86.0 04/23/2018   HDL 44.70 04/23/2018   LDLDIRECT 116.6 05/20/2007   LDLCALC 66 04/23/2018   ALT 33 04/23/2018   AST 24 04/23/2018   NA 133 (L) 10/28/2018   K 4.0 10/28/2018   CL 97 10/28/2018   CREATININE 0.79 10/28/2018   BUN 11 10/28/2018   CO2 29 10/28/2018   TSH 2.64 04/23/2018   PSA 0.20 04/23/2018   INR 1.1 05/10/2008   HGBA1C 7.5 (A) 03/12/2021   MICROALBUR 1.7 04/23/2018    VAS US CAROTID  Result Date: 12/01/2019 Carotid Arterial Duplex Study Indications:  Mild bilateral bruits. Risk Factors: Hypertension, hyperlipidemia, Diabetes, current smoker, coronary               artery disease. Performing Technologist: Olegario Sheareranielle Schmitt RVT  Examination Guidelines: A complete evaluation includes B-mode imaging, spectral Doppler, color Doppler, and power Doppler as needed of all accessible portions of each vessel. Bilateral testing is considered an integral part of a complete examination. Limited examinations for reoccurring indications may be performed as noted.  Right Carotid Findings: +----------+--------+--------+--------+----------------------+--------+           PSV cm/sEDV cm/sStenosisPlaque Description    Comments +----------+--------+--------+--------+----------------------+--------+ CCA Prox  89      12                                             +----------+--------+--------+--------+----------------------+--------+ CCA Distal64      16                                             +----------+--------+--------+--------+----------------------+--------+ ICA Prox  52      14              focal and heterogenous          +----------+--------+--------+--------+----------------------+--------+ ICA Mid   55      22      1-39%                                  +----------+--------+--------+--------+----------------------+--------+ ICA Distal97      27                                    tortuous +----------+--------+--------+--------+----------------------+--------+ ECA       95      17                                             +----------+--------+--------+--------+----------------------+--------+ +----------+--------+-------+----------------+-------------------+           PSV cm/sEDV cmsDescribe        Arm Pressure (mmHG) +----------+--------+-------+----------------+-------------------+ Subclavian119            Multiphasic, [id number]                 +----------+--------+-------+----------------+-------------------+ +---------+--------+--+--------+--+---------+ VertebralPSV cm/s44EDV cm/s10Antegrade +---------+--------+--+--------+--+---------+  Left Carotid Findings: +----------+--------+--------+--------+----------------------+--------+           PSV cm/sEDV cm/sStenosisPlaque Description    Comments +----------+--------+--------+--------+----------------------+--------+ CCA Prox  94      18                                             +----------+--------+--------+--------+----------------------+--------+ CCA Distal58      18                                             +----------+--------+--------+--------+----------------------+--------+ ICA Prox  73      22      1-39%   focal and heterogenous         +----------+--------+--------+--------+----------------------+--------+ ICA Mid   70      26                                             +----------+--------+--------+--------+----------------------+--------+ ICA Distal101     28                                             +----------+--------+--------+--------+----------------------+--------+ ECA        110     21                                             +----------+--------+--------+--------+----------------------+--------+ +----------+--------+--------+----------------+-------------------+           PSV cm/sEDV cm/sDescribe        Arm Pressure (mmHG) +----------+--------+--------+----------------+-------------------+ [id number]             Multiphasic, [id number]                 +----------+--------+--------+----------------+-------------------+ +---------+--------+--+--------+--+---------+ VertebralPSV cm/s47EDV cm/s14Antegrade +---------+--------+--+--------+--+---------+   Summary: Right Carotid: Velocities in the right ICA are consistent with a 1-39% stenosis. Left Carotid: Velocities in the left ICA are consistent with a 1-39% stenosis. Vertebrals:  Bilateral vertebral arteries demonstrate antegrade flow. Subclavians: Normal flow hemodynamics were seen in bilateral subclavian              arteries. *See table(s) above for measurements and observations.  Electronically signed by Charlton Haws MD on 12/01/2019 at 5:00:21 PM.    Final     Assessment & Plan:       Follow-up: No follow-ups on file.  Sonda Primes, MD

## 2021-04-30 NOTE — Assessment & Plan Note (Signed)
Better  

## 2021-04-30 NOTE — Assessment & Plan Note (Signed)
CT coronary calcium score is 245.  Continue with lovastatin Check lipids

## 2021-05-01 NOTE — Assessment & Plan Note (Signed)
Continue with Humalog, Basaglar, Rybelsus

## 2021-05-02 ENCOUNTER — Encounter: Payer: Self-pay | Admitting: Internal Medicine

## 2021-05-02 ENCOUNTER — Ambulatory Visit: Payer: No Typology Code available for payment source | Admitting: Internal Medicine

## 2021-05-02 ENCOUNTER — Other Ambulatory Visit: Payer: Self-pay

## 2021-05-02 VITALS — BP 142/76 | HR 41 | Temp 98.7°F | Ht 69.0 in | Wt 237.0 lb

## 2021-05-02 DIAGNOSIS — I10 Essential (primary) hypertension: Secondary | ICD-10-CM

## 2021-05-02 DIAGNOSIS — E1165 Type 2 diabetes mellitus with hyperglycemia: Secondary | ICD-10-CM

## 2021-05-02 DIAGNOSIS — L0390 Cellulitis, unspecified: Secondary | ICD-10-CM | POA: Diagnosis not present

## 2021-05-02 LAB — CBC WITH DIFFERENTIAL/PLATELET
Basophils Absolute: 0 10*3/uL (ref 0.0–0.1)
Basophils Relative: 0.5 % (ref 0.0–3.0)
Eosinophils Absolute: 0.3 10*3/uL (ref 0.0–0.7)
Eosinophils Relative: 3.3 % (ref 0.0–5.0)
HCT: 45 % (ref 39.0–52.0)
Hemoglobin: 15.1 g/dL (ref 13.0–17.0)
Lymphocytes Relative: 20 % (ref 12.0–46.0)
Lymphs Abs: 1.8 10*3/uL (ref 0.7–4.0)
MCHC: 33.5 g/dL (ref 30.0–36.0)
MCV: 89.1 fl (ref 78.0–100.0)
Monocytes Absolute: 0.9 10*3/uL (ref 0.1–1.0)
Monocytes Relative: 9.7 % (ref 3.0–12.0)
Neutro Abs: 6 10*3/uL (ref 1.4–7.7)
Neutrophils Relative %: 66.5 % (ref 43.0–77.0)
Platelets: 261 10*3/uL (ref 150.0–400.0)
RBC: 5.05 Mil/uL (ref 4.22–5.81)
RDW: 13.2 % (ref 11.5–15.5)
WBC: 8.9 10*3/uL (ref 4.0–10.5)

## 2021-05-02 LAB — URINALYSIS, ROUTINE W REFLEX MICROSCOPIC
Bilirubin Urine: NEGATIVE
Ketones, ur: NEGATIVE
Leukocytes,Ua: NEGATIVE
Nitrite: NEGATIVE
Specific Gravity, Urine: 1.015 (ref 1.000–1.030)
Total Protein, Urine: NEGATIVE
Urine Glucose: >=1000 — AB
Urobilinogen, UA: 0.2 (ref 0.0–1.0)
pH: 6 (ref 5.0–8.0)

## 2021-05-02 LAB — LIPID PANEL
Cholesterol: 129 mg/dL (ref 0–200)
HDL: 44.3 mg/dL (ref >39.00)
LDL Cholesterol: 63 mg/dL (ref 0–99)
NonHDL: 84.94
Total CHOL/HDL Ratio: 3
Triglycerides: 110 mg/dL (ref 0.0–149.0)
VLDL: 22 mg/dL (ref 0.0–40.0)

## 2021-05-02 LAB — COMPREHENSIVE METABOLIC PANEL
ALT: 13 U/L (ref 0–53)
AST: 15 U/L (ref 0–37)
Albumin: 3.8 g/dL (ref 3.5–5.2)
Alkaline Phosphatase: 73 U/L (ref 39–117)
BUN: 16 mg/dL (ref 6–23)
CO2: 30 mEq/L (ref 19–32)
Calcium: 9.5 mg/dL (ref 8.4–10.5)
Chloride: 95 mEq/L — ABNORMAL LOW (ref 96–112)
Creatinine, Ser: 0.82 mg/dL (ref 0.40–1.50)
GFR: 98 mL/min (ref >60.00)
Glucose, Bld: 173 mg/dL — ABNORMAL HIGH (ref 70–99)
Potassium: 4 mEq/L (ref 3.5–5.1)
Sodium: 133 mEq/L — ABNORMAL LOW (ref 135–145)
Total Bilirubin: 0.3 mg/dL (ref 0.2–1.2)
Total Protein: 7.6 g/dL (ref 6.0–8.3)

## 2021-05-02 LAB — MICROALBUMIN / CREATININE URINE RATIO
Creatinine,U: 57.2 mg/dL
Microalb Creat Ratio: 1.2 mg/g (ref 0.0–30.0)
Microalb, Ur: <0.7 mg/dL (ref 0.0–1.9)

## 2021-05-02 LAB — VITAMIN D 25 HYDROXY (VIT D DEFICIENCY, FRACTURES): VITD: 21.97 ng/mL — ABNORMAL LOW (ref 30.00–100.00)

## 2021-05-02 LAB — TSH: TSH: 4.18 u[IU]/mL (ref 0.35–5.50)

## 2021-05-02 LAB — VITAMIN B12: Vitamin B-12: 205 pg/mL — ABNORMAL LOW (ref 211–911)

## 2021-05-02 NOTE — Progress Notes (Signed)
Patient ID: [name] [name], male   DOB: [birth year], 55 y.o.   MRN: [id number]        Chief Complaint: follow up right elbow and right leg red, tender swelling x 2-3 days       HPI:  [name] [name] is a 55 y.o. male here with c/o above x 2-3 days, seemed to start about the same time, has been scratching at those places, no high fever, chills, and Pt denies chest pain, increased sob or doe, wheezing, orthopnea, PND, increased LE swelling, palpitations, dizziness or syncope.   Pt denies polydipsia, polyuria, or new focal neuro s/s.   Pt denies fever, wt loss, night sweats, loss of appetite, or other constitutional symptoms  No other new complaints.         Wt Readings from Last 3 Encounters:  05/02/21 237 lb (107.5 kg)  04/30/21 241 lb 12.8 oz (109.7 kg)  03/12/21 246 lb 9.6 oz (111.9 kg)   BP Readings from Last 3 Encounters:  05/02/21 (!) 142/76  04/30/21 (!) 144/70  03/12/21 (!) 156/80         Past Medical History:  Diagnosis Date   COPD (chronic obstructive pulmonary disease) (HCC)    Diabetes mellitus    GERD (gastroesophageal reflux disease)    Hyperlipidemia    Hypertension    Lower back pain    Nephrolithiasis    Dr Annabell Howells   Past Surgical History:  Procedure Laterality Date   KNEE SURGERY      reports that he has been smoking cigarettes. He has been smoking an average of 2 packs per day. He has never used smokeless tobacco. He reports that he does not drink alcohol and does not use drugs. family history includes CAD in his mother; Diabetes in his mother; Heart disease in an other family member; Hypertension in his mother; Vision loss in his sister. Allergies  Allergen Reactions   Belviq [Lorcaserin Hcl]     wierd   Benazepril Hcl     REACTION: cough   Nsaids     Can take short term Upset stomach   Current Outpatient Medications on File Prior to Visit  Medication Sig Dispense Refill   acarbose (PRECOSE) 25 MG tablet Take 1 tablet (25 mg total) by mouth 3 (three)  times daily with meals. 90 tablet 11   amLODipine-valsartan (EXFORGE) 10-320 MG tablet TAKE 1 TABLET BY MOUTH EVERY DAY 90 tablet 3   ANORO ELLIPTA 62.5-25 MCG/INH AEPB INHALE 1 PUFF INTO THE LUNGS EVERY DAY 180 each 3   aspirin EC 81 MG tablet Take 81 mg by mouth daily.     B-D ULTRAFINE III SHORT PEN 31G X 8 MM MISC USE AS DIRECTED THREE TIMES DAILY 100 each 0   doxycycline (VIBRA-TABS) 100 MG tablet Take 1 tablet (100 mg total) by mouth 2 (two) times daily. 28 tablet 0   empagliflozin (JARDIANCE) 10 MG TABS tablet Take 1 tablet (10 mg total) by mouth daily before breakfast. 90 tablet 3   fluticasone (FLONASE) 50 MCG/ACT nasal spray SPRAY 2 SPRAYS INTO EACH NOSTRIL EVERY DAY 48 mL 1   furosemide (LASIX) 20 MG tablet Take 1-2 tablets (20-40 mg total) by mouth daily as needed. 60 tablet 3   insulin glargine, 2 Unit Dial, (TOUJEO MAX SOLOSTAR) 300 UNIT/ML Solostar Pen Inject 68 Units into the skin every morning. And pen needles 1/day. 72 mL 3   Lancets (ONETOUCH DELICA PLUS LANCET33G) MISC USE TO check blood sugar TWICE DAILY  100 each 3   lovastatin (MEVACOR) 20 MG tablet Take 1 tablet (20 mg total) by mouth daily. 90 tablet 3   metFORMIN (GLUCOPHAGE-XR) 500 MG 24 hr tablet TAKE ONE TABLET BY MOUTH EVERY MORNING with BREAKFAST 90 tablet 2   morphine (MS CONTIN) 100 MG 12 hr tablet Take 2 tablets (200 mg total) by mouth 3 (three) times daily. 180 tablet 0   morphine (MS CONTIN) 100 MG 12 hr tablet Take 2 tablets (200 mg total) by mouth 3 (three) times daily. 180 tablet 0   morphine (MS CONTIN) 100 MG 12 hr tablet Take 2 tablets (200 mg total) by mouth 3 (three) times daily. 180 tablet 0   morphine (MSIR) 15 MG tablet Take 1 tablet (15 mg total) by mouth 2 (two) times daily as needed for severe pain. 60 tablet 0   morphine (MSIR) 15 MG tablet Take 1 tablet (15 mg total) by mouth 2 (two) times daily as needed for severe pain. 60 tablet 0   morphine (MSIR) 15 MG tablet Take 1 tablet (15 mg total) by  mouth 2 (two) times daily as needed for severe pain. 60 tablet 0   ONETOUCH ULTRA test strip USE TO TEST BLOOD SUGAR TWICE DAILY 100 strip 0   RYBELSUS 14 MG TABS take ONE tablet daily 90 tablet 1   Cholecalciferol (VITAMIN D3) 50 MCG (2000 UT) capsule Take 1 capsule (2,000 Units total) by mouth daily. 100 capsule 3   cyanocobalamin (,VITAMIN B-12,) 1000 MCG/ML injection Give 1000 mcg of Vitamin b12 sq daily x 1 week, then weekly for 1 month, then once every 2 weeks 10 mL 6   SYRINGE-NEEDLE, DISP, 3 ML (BD ECLIPSE SYRINGE) 25G X 1" 3 ML MISC Use for vitamin B12 shots as directed 50 each 3   Vitamin D, Ergocalciferol, (DRISDOL) 1.25 MG (50000 UNIT) CAPS capsule Take 1 capsule (50,000 Units total) by mouth every 7 (seven) days. 8 capsule 0   No current facility-administered medications on file prior to visit.        ROS:  All others reviewed and negative.  Objective        PE:  BP (!) 142/76 (BP Location: Right Arm, Patient Position: Sitting, Cuff Size: Large)   Pulse (!) 41   Temp 98.7 F (37.1 C) (Oral)   Ht 5\' 9"  (1.753 m)   Wt 237 lb (107.5 kg)   SpO2 99%   BMI 35.00 kg/m                 Constitutional: Pt appears in NAD               HENT: Head: NCAT.                Right Ear: External ear normal.                 Left Ear: External ear normal.                Eyes: . Pupils are equal, round, and reactive to light. Conjunctivae and EOM are normal               Nose: without d/c or deformity               Neck: Neck supple. Gross normal ROM               Cardiovascular: Normal rate and regular rhythm.  Pulmonary/Chest: Effort normal and breath sounds without rales or wheezing.                Abd:  Soft, NT, ND, + BS, no organomegaly               Neurological: Pt is alert. At baseline orientation, motor grossly intact               Skin:  LE edema - none; distal RLE with chronic stasis changes but new red, tender, swelling to the distal medial area for approx 3 cm  prox to the ankle, without red streak, ulcer or drainage; left elbow has olecranon area 3 cm red, tender swelling without red streak, ulcer or drainage               Psychiatric: Pt behavior is normal without agitation   Micro: none  Cardiac tracings I have personally interpreted today:  none  Pertinent Radiological findings (summarize): none   Lab Results  Component Value Date   WBC 8.9 05/02/2021   HGB 15.1 05/02/2021   HCT 45.0 05/02/2021   PLT 261.0 05/02/2021   GLUCOSE 173 (H) 05/02/2021   CHOL 129 05/02/2021   TRIG 110.0 05/02/2021   HDL 44.30 05/02/2021   LDLDIRECT 116.6 05/20/2007   LDLCALC 63 05/02/2021   ALT 13 05/02/2021   AST 15 05/02/2021   NA 133 (L) 05/02/2021   K 4.0 05/02/2021   CL 95 (L) 05/02/2021   CREATININE 0.82 05/02/2021   BUN 16 05/02/2021   CO2 30 05/02/2021   TSH 4.18 05/02/2021   PSA 0.20 04/23/2018   INR 1.1 05/10/2008   HGBA1C 7.5 (A) 03/12/2021   MICROALBUR <0.7 05/02/2021   Assessment/Plan:  JOSEPHINE RUDNICK is a 55 y.o. White or Caucasian [1] male with  has a past medical history of COPD (chronic obstructive pulmonary disease) (HCC), Diabetes mellitus, GERD (gastroesophageal reflux disease), Hyperlipidemia, Hypertension, Lower back pain, and Nephrolithiasis.  Cellulitis To left elbow and right leg - Mild to mod, for antibx course,  to f/u any worsening symptoms or concerns  Essential hypertension BP Readings from Last 3 Encounters:  05/02/21 (!) 142/76  04/30/21 (!) 144/70  03/12/21 (!) 156/80   Chronic uncontrolled, pt to continue medical treatment exforge, declines other change in tx, cont to follow next visit   Uncontrolled type 2 diabetes mellitus with hyperglycemia (HCC) Lab Results  Component Value Date   HGBA1C 7.5 (A) 03/12/2021   Mild uncontrolled, declines lab today, pt to continue current medical treatment jardiacne, toujeo, rybelsus  Followup: Return if symptoms worsen or fail to improve.  Oliver Barre, MD 05/05/2021  9:28 PM [facility]

## 2021-05-02 NOTE — Addendum Note (Signed)
Addended by: Madelon Lips on: 05/02/2021 02:43 PM   Modules accepted: Orders

## 2021-05-03 ENCOUNTER — Encounter: Payer: Self-pay | Admitting: Internal Medicine

## 2021-05-03 DIAGNOSIS — E538 Deficiency of other specified B group vitamins: Secondary | ICD-10-CM | POA: Insufficient documentation

## 2021-05-03 DIAGNOSIS — E559 Vitamin D deficiency, unspecified: Secondary | ICD-10-CM | POA: Insufficient documentation

## 2021-05-03 LAB — D-DIMER, QUANTITATIVE: D-Dimer, Quant: 0.84 mcg/mL FEU — ABNORMAL HIGH (ref <0.50)

## 2021-05-03 MED ORDER — VITAMIN D3 50 MCG (2000 UT) PO CAPS: 2000.0000 [IU] | ORAL_CAPSULE | Freq: Every day | ORAL | 3 refills | Status: AC

## 2021-05-03 MED ORDER — "BD ECLIPSE SYRINGE 25G X 1"" 3 ML MISC": 3 refills | Status: DC

## 2021-05-03 MED ORDER — CYANOCOBALAMIN 1000 MCG/ML IJ SOLN: INTRAMUSCULAR | 6 refills | Status: DC

## 2021-05-03 MED ORDER — VITAMIN D (ERGOCALCIFEROL) 1.25 MG (50000 UNIT) PO CAPS: 50000.0000 [IU] | ORAL_CAPSULE | ORAL | 0 refills | Status: DC

## 2021-05-03 NOTE — Assessment & Plan Note (Signed)
low Vitamin D levels - please, start Vit D prescription 50000 iu weekly (Rx emailed to your pharmacy) followed by over-the-counter Vit D 2000 iu daily.  

## 2021-05-03 NOTE — Addendum Note (Signed)
Addended by: Tresa Garter on: 05/03/2021 12:14 PM   Modules accepted: Orders

## 2021-05-03 NOTE — Assessment & Plan Note (Signed)
New. Give 1000 mcg of Vitamin b12 sq daily x 1 week, then weekly for 1 month, then once every 2 weeks

## 2021-05-05 ENCOUNTER — Encounter: Payer: Self-pay | Admitting: Internal Medicine

## 2021-05-05 NOTE — Patient Instructions (Signed)
Please take all new medication as prescribed - the antibiotic 

## 2021-05-05 NOTE — Assessment & Plan Note (Signed)
To left elbow and right leg - Mild to mod, for antibx course,  to f/u any worsening symptoms or concerns

## 2021-05-05 NOTE — Assessment & Plan Note (Signed)
Lab Results  Component Value Date   HGBA1C 7.5 (A) 03/12/2021   Mild uncontrolled, declines lab today, pt to continue current medical treatment jardiacne, toujeo, rybelsus

## 2021-05-05 NOTE — Assessment & Plan Note (Signed)
BP Readings from Last 3 Encounters:  05/02/21 (!) 142/76  04/30/21 (!) 144/70  03/12/21 (!) 156/80   Chronic uncontrolled, pt to continue medical treatment exforge, declines other change in tx, cont to follow next visit

## 2021-05-07 ENCOUNTER — Ambulatory Visit (HOSPITAL_COMMUNITY)
Admission: RE | Admit: 2021-05-07 | Discharge: 2021-05-07 | Disposition: A | Discharge status: Home / Self Care | Payer: No Typology Code available for payment source | Source: Ambulatory Visit | Attending: Cardiovascular Disease | Admitting: Cardiovascular Disease

## 2021-05-07 ENCOUNTER — Other Ambulatory Visit: Payer: Self-pay

## 2021-05-07 DIAGNOSIS — L03115 Cellulitis of right lower limb: Secondary | ICD-10-CM

## 2021-05-07 DIAGNOSIS — R600 Localized edema: Secondary | ICD-10-CM | POA: Diagnosis present

## 2021-05-07 DIAGNOSIS — L0390 Cellulitis, unspecified: Secondary | ICD-10-CM | POA: Diagnosis present

## 2021-06-13 ENCOUNTER — Other Ambulatory Visit: Payer: Self-pay | Admitting: Endocrinology

## 2021-06-13 DIAGNOSIS — E1165 Type 2 diabetes mellitus with hyperglycemia: Secondary | ICD-10-CM

## 2021-06-14 ENCOUNTER — Other Ambulatory Visit: Payer: Self-pay | Admitting: Endocrinology

## 2021-06-21 LAB — HM DIABETES EYE EXAM

## 2021-06-22 ENCOUNTER — Ambulatory Visit: Payer: No Typology Code available for payment source

## 2021-06-28 ENCOUNTER — Encounter: Payer: Self-pay | Admitting: Internal Medicine

## 2021-07-01 ENCOUNTER — Other Ambulatory Visit: Payer: Self-pay | Admitting: Internal Medicine

## 2021-07-10 ENCOUNTER — Other Ambulatory Visit: Payer: Self-pay | Admitting: Endocrinology

## 2021-07-16 ENCOUNTER — Other Ambulatory Visit: Payer: Self-pay

## 2021-07-16 ENCOUNTER — Ambulatory Visit (INDEPENDENT_AMBULATORY_CARE_PROVIDER_SITE_OTHER): Payer: No Typology Code available for payment source | Admitting: Endocrinology

## 2021-07-16 VITALS — BP 112/40 | HR 76 | Ht 69.0 in | Wt 238.4 lb

## 2021-07-16 DIAGNOSIS — E1165 Type 2 diabetes mellitus with hyperglycemia: Secondary | ICD-10-CM

## 2021-07-16 LAB — POCT GLYCOSYLATED HEMOGLOBIN (HGB A1C): Hemoglobin A1C: 7.8 % — AB (ref 4.0–5.6)

## 2021-07-16 NOTE — Progress Notes (Signed)
Subjective:    Patient ID: [name] [name], male    DOB: Aug 02, 1966, 55 y.o.   MRN: [id number]  HPI Pt returns for f/u of diabetes mellitus:  DM type: Insulin-requiring type 2.   Dx'ed: 2005 Complications: CAD Therapy: insulin since 2007, and 4 oral meds.   DKA: never Severe hypoglycemia: never.  Pancreatitis: never.   Other: he takes QD insulin, due to h/o noncompliance; he works M-F, as a Haematologist; edema limited rx options; He says cbg's on work days=days off.   Interval history: no cbg record, but states cbg's vary from 65-180.  He seldom has hypoglycemia, and these episodes are mild.  It is in general lowest fasting, or when a meal is delayed.  He says he never misses meds.   Past Medical History:  Diagnosis Date   COPD (chronic obstructive pulmonary disease) (HCC)    Diabetes mellitus    GERD (gastroesophageal reflux disease)    Hyperlipidemia    Hypertension    Lower back pain    Nephrolithiasis    Dr Annabell Howells    Past Surgical History:  Procedure Laterality Date   KNEE SURGERY      Social History   Socioeconomic History   Marital status: Married    Spouse name: Not on file   Number of children: 2   Years of education: Not on file   Highest education level: Not on file  Occupational History   Not on file  Tobacco Use   Smoking status: Every Day    Packs/day: 2.00    Types: Cigarettes   Smokeless tobacco: Never  Substance and Sexual Activity   Alcohol use: No   Drug use: No   Sexual activity: Yes  Other Topics Concern   Not on file  Social History Narrative   No regular exercise   Social Determinants of Health   Financial Resource Strain: Not on file  Food Insecurity: Not on file  Transportation Needs: Not on file  Physical Activity: Not on file  Stress: Not on file  Social Connections: Not on file  Intimate Partner Violence: Not on file    Current Outpatient Medications on File Prior to Visit  Medication Sig Dispense Refill    acarbose (PRECOSE) 25 MG tablet take ONE tablet by MOUTH THREE times daily with meals 90 tablet 1   amLODipine-valsartan (EXFORGE) 10-320 MG tablet TAKE ONE TABLET EVERY DAY 90 tablet 2   aspirin EC 81 MG tablet Take 81 mg by mouth daily.     B-D ULTRAFINE III SHORT PEN 31G X 8 MM MISC USE AS DIRECTED THREE TIMES DAILY 100 each 0   Cholecalciferol (VITAMIN D3) 50 MCG (2000 UT) capsule Take 1 capsule (2,000 Units total) by mouth daily. 100 capsule 3   cyanocobalamin (,VITAMIN B-12,) 1000 MCG/ML injection Give 1000 mcg of Vitamin b12 sq daily x 1 week, then weekly for 1 month, then once every 2 weeks 10 mL 6   doxycycline (VIBRA-TABS) 100 MG tablet Take 1 tablet (100 mg total) by mouth 2 (two) times daily. 28 tablet 0   empagliflozin (JARDIANCE) 10 MG TABS tablet Take 1 tablet (10 mg total) by mouth daily before breakfast. 90 tablet 3   fluticasone (FLONASE) 50 MCG/ACT nasal spray SPRAY 2 SPRAYS INTO EACH NOSTRIL EVERY DAY 48 mL 1   furosemide (LASIX) 20 MG tablet Take 1-2 tablets (20-40 mg total) by mouth daily as needed. 60 tablet 3   insulin glargine, 2 Unit Dial, (TOUJEO MAX SOLOSTAR)  300 UNIT/ML Solostar Pen Inject 68 Units into the skin every morning. And pen needles 1/day. 72 mL 3   Lancets (ONETOUCH DELICA PLUS LANCET33G) MISC USE TO check blood sugar TWICE DAILY 100 each 3   lovastatin (MEVACOR) 20 MG tablet Take 1 tablet (20 mg total) by mouth daily. 90 tablet 3   metFORMIN (GLUCOPHAGE-XR) 500 MG 24 hr tablet TAKE ONE TABLET BY MOUTH EVERY MORNING with BREAKFAST 90 tablet 2   ONETOUCH ULTRA test strip USE TO TEST BLOOD SUGAR TWICE DAILY 100 strip 0   RYBELSUS 14 MG TABS take ONE tablet daily 90 tablet 1   SYRINGE-NEEDLE, DISP, 3 ML (BD ECLIPSE SYRINGE) 25G X 1" 3 ML MISC Use for vitamin B12 shots as directed 50 each 3   umeclidinium-vilanterol (ANORO ELLIPTA) 62.5-25 MCG/ACT AEPB INHALE ONE PUFF BY MOUTH DAILY 60 each 5   Vitamin D, Ergocalciferol, (DRISDOL) 1.25 MG (50000 UNIT) CAPS  capsule Take 1 capsule (50,000 Units total) by mouth every 7 (seven) days. 8 capsule 0   No current facility-administered medications on file prior to visit.    Allergies  Allergen Reactions   Belviq [Lorcaserin Hcl]     wierd   Benazepril Hcl     REACTION: cough   Nsaids     Can take short term Upset stomach    Family History  Problem Relation Age of Onset   Hypertension Mother    Diabetes Mother    CAD Mother    Heart disease Other        CAD male relative <60   Vision loss Sister     BP (!) 112/40 (BP Location: Right Arm, Patient Position: Sitting, Cuff Size: Large)   Pulse 76   Ht 5\' 9"  (1.753 m)   Wt 238 lb 6.4 oz (108.1 kg)   SpO2 95%   BMI 35.21 kg/m    Review of Systems Denies N/V/HB.  He has lost a few more lbs.      Objective:   Physical Exam  Lab Results  Component Value Date   CREATININE 0.82 05/02/2021   BUN 16 05/02/2021   NA 133 (L) 05/02/2021   K 4.0 05/02/2021   CL 95 (L) 05/02/2021   CO2 30 05/02/2021    Lab Results  Component Value Date   HGBA1C 7.8 (A) 07/16/2021      Assessment & Plan:  Insulin-requiring type 2 DM: Hypoglycemia, due to insulin: this limits aggressiveness of glycemic control.   Patient Instructions  Please continue the same insulin and 4 other diabetes medications.   check your blood sugar twice a day.  vary the time of day when you check, between before the 3 meals, and at bedtime.  also check if you have symptoms of your blood sugar being too high or too low.  please keep a record of the readings and bring it to your next appointment here.  You can write it on any piece of paper.  please call 07/18/2021 sooner if your blood sugar goes below 70, or if you have a lot of readings over 200.   On this type of insulin schedule, you should eat meals on a regular schedule (especially lunch).  If a meal is missed or significantly delayed, your blood sugar could go low.   Please come back for a follow-up appointment in 4 months.

## 2021-07-16 NOTE — Patient Instructions (Addendum)
Please continue the same insulin and 4 other diabetes medications.   check your blood sugar twice a day.  vary the time of day when you check, between before the 3 meals, and at bedtime.  also check if you have symptoms of your blood sugar being too high or too low.  please keep a record of the readings and bring it to your next appointment here.  You can write it on any piece of paper.  please call us sooner if your blood sugar goes below 70, or if you have a lot of readings over 200.   On this type of insulin schedule, you should eat meals on a regular schedule (especially lunch).  If a meal is missed or significantly delayed, your blood sugar could go low.   Please come back for a follow-up appointment in 4 months.

## 2021-07-18 ENCOUNTER — Other Ambulatory Visit: Payer: Self-pay

## 2021-07-18 ENCOUNTER — Encounter: Payer: Self-pay | Admitting: Internal Medicine

## 2021-07-18 ENCOUNTER — Ambulatory Visit (INDEPENDENT_AMBULATORY_CARE_PROVIDER_SITE_OTHER): Payer: No Typology Code available for payment source | Admitting: Internal Medicine

## 2021-07-18 DIAGNOSIS — E538 Deficiency of other specified B group vitamins: Secondary | ICD-10-CM

## 2021-07-18 DIAGNOSIS — M79605 Pain in left leg: Secondary | ICD-10-CM

## 2021-07-18 DIAGNOSIS — J449 Chronic obstructive pulmonary disease, unspecified: Secondary | ICD-10-CM | POA: Diagnosis not present

## 2021-07-18 DIAGNOSIS — M545 Low back pain, unspecified: Secondary | ICD-10-CM

## 2021-07-18 DIAGNOSIS — I2510 Atherosclerotic heart disease of native coronary artery without angina pectoris: Secondary | ICD-10-CM

## 2021-07-18 DIAGNOSIS — I2583 Coronary atherosclerosis due to lipid rich plaque: Secondary | ICD-10-CM

## 2021-07-18 DIAGNOSIS — M79604 Pain in right leg: Secondary | ICD-10-CM

## 2021-07-18 DIAGNOSIS — E1165 Type 2 diabetes mellitus with hyperglycemia: Secondary | ICD-10-CM

## 2021-07-18 MED ORDER — MORPHINE SULFATE 15 MG PO TABS: 15.0000 mg | ORAL_TABLET | Freq: Two times a day (BID) | ORAL | 0 refills | Status: DC | PRN

## 2021-07-18 MED ORDER — MORPHINE SULFATE ER 100 MG PO TBCR: 200.0000 mg | EXTENDED_RELEASE_TABLET | Freq: Three times a day (TID) | ORAL | 0 refills | Status: DC

## 2021-07-18 NOTE — Assessment & Plan Note (Signed)
Continue with lovastatin 

## 2021-07-18 NOTE — Assessment & Plan Note (Signed)
Humalog, Basaglar, Rybelsus Stable.  Continue with Humalog, Basaglar, Rybelsus.  Continue with regular appointment with Dr. Everardo All.  He is losing weight on Rybelsus

## 2021-07-18 NOTE — Assessment & Plan Note (Signed)
2 ppd 

## 2021-07-18 NOTE — Assessment & Plan Note (Signed)
Cont on B12 ?

## 2021-07-18 NOTE — Progress Notes (Signed)
Subjective:  Patient ID: [name] [name], male    DOB: 14-Oct-1965  Age: 55 y.o. MRN: [id number]  CC: 3 month f/u (No concerns/)   HPI [name] [name] presents for LBP, DM, HTN  Outpatient Medications Prior to Visit  Medication Sig Dispense Refill   acarbose (PRECOSE) 25 MG tablet take ONE tablet by MOUTH THREE times daily with meals 90 tablet 1   amLODipine-valsartan (EXFORGE) 10-320 MG tablet TAKE ONE TABLET EVERY DAY 90 tablet 2   aspirin EC 81 MG tablet Take 81 mg by mouth daily.     B-D ULTRAFINE III SHORT PEN 31G X 8 MM MISC USE AS DIRECTED THREE TIMES DAILY 100 each 0   Cholecalciferol (VITAMIN D3) 50 MCG (2000 UT) capsule Take 1 capsule (2,000 Units total) by mouth daily. 100 capsule 3   cyanocobalamin (,VITAMIN B-12,) 1000 MCG/ML injection Give 1000 mcg of Vitamin b12 sq daily x 1 week, then weekly for 1 month, then once every 2 weeks 10 mL 6   doxycycline (VIBRA-TABS) 100 MG tablet Take 1 tablet (100 mg total) by mouth 2 (two) times daily. 28 tablet 0   empagliflozin (JARDIANCE) 10 MG TABS tablet Take 1 tablet (10 mg total) by mouth daily before breakfast. 90 tablet 3   fluticasone (FLONASE) 50 MCG/ACT nasal spray SPRAY 2 SPRAYS INTO EACH NOSTRIL EVERY DAY 48 mL 1   furosemide (LASIX) 20 MG tablet Take 1-2 tablets (20-40 mg total) by mouth daily as needed. 60 tablet 3   insulin glargine, 2 Unit Dial, (TOUJEO MAX SOLOSTAR) 300 UNIT/ML Solostar Pen Inject 68 Units into the skin every morning. And pen needles 1/day. 72 mL 3   Lancets (ONETOUCH DELICA PLUS 123XX123) MISC USE TO check blood sugar TWICE DAILY 100 each 3   lovastatin (MEVACOR) 20 MG tablet Take 1 tablet (20 mg total) by mouth daily. 90 tablet 3   metFORMIN (GLUCOPHAGE-XR) 500 MG 24 hr tablet TAKE ONE TABLET BY MOUTH EVERY MORNING with BREAKFAST 90 tablet 2   ONETOUCH ULTRA test strip USE TO TEST BLOOD SUGAR TWICE DAILY 100 strip 0   RYBELSUS 14 MG TABS take ONE tablet daily 90 tablet 1   SYRINGE-NEEDLE, DISP, 3 ML  (BD ECLIPSE SYRINGE) 25G X 1" 3 ML MISC Use for vitamin B12 shots as directed 50 each 3   umeclidinium-vilanterol (ANORO ELLIPTA) 62.5-25 MCG/ACT AEPB INHALE ONE PUFF BY MOUTH DAILY 60 each 5   Vitamin D, Ergocalciferol, (DRISDOL) 1.25 MG (50000 UNIT) CAPS capsule Take 1 capsule (50,000 Units total) by mouth every 7 (seven) days. 8 capsule 0   morphine (MS CONTIN) 100 MG 12 hr tablet Take 2 tablets (200 mg total) by mouth 3 (three) times daily. 180 tablet 0   morphine (MS CONTIN) 100 MG 12 hr tablet Take 2 tablets (200 mg total) by mouth 3 (three) times daily. 180 tablet 0   morphine (MS CONTIN) 100 MG 12 hr tablet Take 2 tablets (200 mg total) by mouth 3 (three) times daily. 180 tablet 0   morphine (MSIR) 15 MG tablet Take 1 tablet (15 mg total) by mouth 2 (two) times daily as needed for severe pain. 60 tablet 0   morphine (MSIR) 15 MG tablet Take 1 tablet (15 mg total) by mouth 2 (two) times daily as needed for severe pain. 60 tablet 0   morphine (MSIR) 15 MG tablet Take 1 tablet (15 mg total) by mouth 2 (two) times daily as needed for severe pain. 60 tablet 0  No facility-administered medications prior to visit.    ROS: Review of Systems  Constitutional:  Negative for appetite change, fatigue and unexpected weight change.  HENT:  Negative for congestion, nosebleeds, sneezing, sore throat and trouble swallowing.   Eyes:  Negative for itching and visual disturbance.  Respiratory:  Negative for cough.   Cardiovascular:  Negative for chest pain, palpitations and leg swelling.  Gastrointestinal:  Negative for abdominal distention, blood in stool, diarrhea and nausea.  Genitourinary:  Negative for frequency and hematuria.  Musculoskeletal:  Positive for arthralgias and back pain. Negative for gait problem, joint swelling and neck pain.  Skin:  Negative for rash.  Neurological:  Negative for dizziness, tremors, speech difficulty and weakness.  Psychiatric/Behavioral:  Negative for agitation,  dysphoric mood, sleep disturbance and suicidal ideas. The patient is not nervous/anxious.    Objective:  BP 128/70   Pulse 74   Resp 18   Ht 5\' 9"  (1.753 m)   Wt 236 lb 12.8 oz (107.4 kg)   SpO2 99%   BMI 34.97 kg/m   BP Readings from Last 3 Encounters:  07/18/21 128/70  07/16/21 (!) 112/40  05/02/21 (!) 142/76    Wt Readings from Last 3 Encounters:  07/18/21 236 lb 12.8 oz (107.4 kg)  07/16/21 238 lb 6.4 oz (108.1 kg)  05/02/21 237 lb (107.5 kg)    Physical Exam Constitutional:      General: He is not in acute distress.    Appearance: He is well-developed. He is obese.     Comments: NAD  Eyes:     Conjunctiva/sclera: Conjunctivae normal.     Pupils: Pupils are equal, round, and reactive to light.  Neck:     Thyroid: No thyromegaly.     Vascular: No JVD.  Cardiovascular:     Rate and Rhythm: Normal rate and regular rhythm.     Heart sounds: Normal heart sounds. No murmur heard.   No friction rub. No gallop.  Pulmonary:     Effort: Pulmonary effort is normal. No respiratory distress.     Breath sounds: Normal breath sounds. No wheezing or rales.  Chest:     Chest wall: No tenderness.  Abdominal:     General: Bowel sounds are normal. There is no distension.     Palpations: Abdomen is soft. There is no mass.     Tenderness: There is no abdominal tenderness. There is no guarding or rebound.  Musculoskeletal:        General: Tenderness present. Normal range of motion.     Cervical back: Normal range of motion.  Lymphadenopathy:     Cervical: No cervical adenopathy.  Skin:    General: Skin is warm and dry.     Findings: No rash.  Neurological:     Mental Status: He is alert and oriented to person, place, and time.     Cranial Nerves: No cranial nerve deficit.     Motor: No abnormal muscle tone.     Coordination: Coordination normal.     Gait: Gait normal.     Deep Tendon Reflexes: Reflexes are normal and symmetric.  Psychiatric:        Behavior: Behavior  normal.        Thought Content: Thought content normal.        Judgment: Judgment normal.  LS tender  Lab Results  Component Value Date   WBC 8.9 05/02/2021   HGB 15.1 05/02/2021   HCT 45.0 05/02/2021   PLT 261.0 05/02/2021  GLUCOSE 173 (H) 05/02/2021   CHOL 129 05/02/2021   TRIG 110.0 05/02/2021   HDL 44.30 05/02/2021   LDLDIRECT 116.6 05/20/2007   LDLCALC 63 05/02/2021   ALT 13 05/02/2021   AST 15 05/02/2021   NA 133 (L) 05/02/2021   K 4.0 05/02/2021   CL 95 (L) 05/02/2021   CREATININE 0.82 05/02/2021   BUN 16 05/02/2021   CO2 30 05/02/2021   TSH 4.18 05/02/2021   PSA 0.20 04/23/2018   INR 1.1 05/10/2008   HGBA1C 7.8 (A) 07/16/2021   MICROALBUR <0.7 05/02/2021    VAS [place] LOWER EXTREMITY VENOUS (DVT)  Result Date: 05/10/2021  Lower Venous DVT Study Patient Name:  CLARON CAPPELL  Date of Exam:   05/07/2021 Medical Rec #: [id number]         Accession #:    [id number] Date of Birth: [birth year]        Patient Gender: M Patient Age:   20 years Exam Location:  [facility] Procedure:      VAS [place] LOWER EXTREMITY VENOUS (DVT) Referring Phys: Tyrone Apple Bridgette Wolden --------------------------------------------------------------------------------  Indications: Patient sctratched anterior and posterior right lower leg approximately 3 weeks ago. The lower leg is now swollen and has erythema. Patient denies chest pain and SOB but does have a positive D-dimer test.  Comparison Study: None Performing Technologist: Alecia Mackin RVT, RDCS (AE), RDMS  Examination Guidelines: A complete evaluation includes B-mode imaging, spectral Doppler, color Doppler, and power Doppler as needed of all accessible portions of each vessel. Bilateral testing is considered an integral part of a complete examination. Limited examinations for reoccurring indications may be performed as noted. The reflux portion of the exam is performed with the patient in reverse Trendelenburg.   +---------+---------------+---------+-----------+----------+--------------+ RIGHT    CompressibilityPhasicitySpontaneityPropertiesThrombus Aging +---------+---------------+---------+-----------+----------+--------------+ CFV      Full           Yes      Yes                                 +---------+---------------+---------+-----------+----------+--------------+ SFJ      Full           Yes      Yes                                 +---------+---------------+---------+-----------+----------+--------------+ FV Prox  Full           Yes      Yes                                 +---------+---------------+---------+-----------+----------+--------------+ FV Mid   Full           Yes      Yes                                 +---------+---------------+---------+-----------+----------+--------------+ FV DistalFull           Yes      Yes                                 +---------+---------------+---------+-----------+----------+--------------+ PFV      Full                                                        +---------+---------------+---------+-----------+----------+--------------+  POP      Full           Yes      Yes                                 +---------+---------------+---------+-----------+----------+--------------+ PTV      Full           Yes      Yes                                 +---------+---------------+---------+-----------+----------+--------------+ PERO     Full           Yes      Yes                                 +---------+---------------+---------+-----------+----------+--------------+ Gastroc  Full                                                        +---------+---------------+---------+-----------+----------+--------------+ GSV      Full           Yes      Yes                                 +---------+---------------+---------+-----------+----------+--------------+    +---------+---------------+---------+-----------+----------+--------------+ LEFT     CompressibilityPhasicitySpontaneityPropertiesThrombus Aging +---------+---------------+---------+-----------+----------+--------------+ CFV      Full           Yes      Yes                                 +---------+---------------+---------+-----------+----------+--------------+ SFJ      Full           Yes      Yes                                 +---------+---------------+---------+-----------+----------+--------------+ FV Prox  Full           Yes      Yes                                 +---------+---------------+---------+-----------+----------+--------------+ FV Mid   Full           Yes      Yes                                 +---------+---------------+---------+-----------+----------+--------------+ FV DistalFull           Yes      Yes                                 +---------+---------------+---------+-----------+----------+--------------+ PFV      Full                                                        +---------+---------------+---------+-----------+----------+--------------+  POP      Full           Yes      Yes                                 +---------+---------------+---------+-----------+----------+--------------+ PTV      Full           Yes      Yes                                 +---------+---------------+---------+-----------+----------+--------------+ PERO     Full           Yes      Yes                                 +---------+---------------+---------+-----------+----------+--------------+ Gastroc  Full                                                        +---------+---------------+---------+-----------+----------+--------------+ GSV      Full           Yes      Yes                                 +---------+---------------+---------+-----------+----------+--------------+    Findings reported to Dr. Loren Racer email  through [facility] at 2:30 pm.  Summary: RIGHT: - No evidence of deep vein thrombosis in the lower extremity. No indirect evidence of obstruction proximal to the inguinal ligament. - No cystic structure found in the popliteal fossa. - Large groin lymph nodes, largest measuring 3.2 x .8 x 1.6 cm. Mild superficial edema noted throughout calf.  LEFT: - No evidence of deep vein thrombosis in the lower extremity. No indirect evidence of obstruction proximal to the inguinal ligament. - No cystic structure found in the popliteal fossa.  *See table(s) above for measurements and observations. Electronically signed by Nanetta Batty MD on 05/10/2021 at 7:31:55 AM.    Final     Assessment & Plan:   Problem List Items Addressed This Visit     B12 deficiency    Cont on B12      CAD (coronary artery disease)     Continue with lovastatin      COPD (chronic obstructive pulmonary disease) with chronic bronchitis (HCC)    2 ppd      Low back pain radiating to both legs    Chronic severe LBP Continue with MSIR, MSContin   Potential benefits of a long term opioids  use as well as potential risks  and complications were explained to the patient and were aknowledged. He is maintaining stooped over posture when standing/walking      Relevant Medications   morphine (MS CONTIN) 100 MG 12 hr tablet   morphine (MS CONTIN) 100 MG 12 hr tablet   morphine (MS CONTIN) 100 MG 12 hr tablet   morphine (MSIR) 15 MG tablet   morphine (MSIR) 15 MG tablet   morphine (MSIR) 15 MG tablet   Uncontrolled type 2 diabetes mellitus with hyperglycemia (HCC)    Humalog, Basaglar,  Rybelsus Stable.  Continue with Humalog, Basaglar, Rybelsus.  Continue with regular appointment with Dr. Loanne Drilling.  He is losing weight on Rybelsus         Meds ordered this encounter  Medications   morphine (MS CONTIN) 100 MG 12 hr tablet    Sig: Take 2 tablets (200 mg total) by mouth 3 (three) times daily.    Dispense:  180 tablet    Refill:  0     Fill on or after 09/30/21   morphine (MS CONTIN) 100 MG 12 hr tablet    Sig: Take 2 tablets (200 mg total) by mouth 3 (three) times daily.    Dispense:  180 tablet    Refill:  0    Fill on or after 08/31/21   morphine (MS CONTIN) 100 MG 12 hr tablet    Sig: Take 2 tablets (200 mg total) by mouth 3 (three) times daily.    Dispense:  180 tablet    Refill:  0    Fill on or after 08/01/21   morphine (MSIR) 15 MG tablet    Sig: Take 1 tablet (15 mg total) by mouth 2 (two) times daily as needed for severe pain.    Dispense:  60 tablet    Refill:  0    Please fill on or after 09/30/21   morphine (MSIR) 15 MG tablet    Sig: Take 1 tablet (15 mg total) by mouth 2 (two) times daily as needed for severe pain.    Dispense:  60 tablet    Refill:  0    Please fill on or after 08/31/21   morphine (MSIR) 15 MG tablet    Sig: Take 1 tablet (15 mg total) by mouth 2 (two) times daily as needed for severe pain.    Dispense:  60 tablet    Refill:  0    Please fill on or after 08/01/21      + Follow-up: Return in about 3 months (around 10/18/2021) for a follow-up visit.  Walker Kehr, MD

## 2021-07-18 NOTE — Assessment & Plan Note (Signed)
Chronic severe LBP Continue with MSIR, MSContin   Potential benefits of a long term opioids  use as well as potential risks  and complications were explained to the patient and were aknowledged. He is maintaining stooped over posture when standing/walking

## 2021-07-23 ENCOUNTER — Other Ambulatory Visit: Payer: Self-pay | Admitting: Endocrinology

## 2021-08-14 ENCOUNTER — Telehealth: Payer: Self-pay | Admitting: Pharmacy Technician

## 2021-08-14 ENCOUNTER — Other Ambulatory Visit (HOSPITAL_COMMUNITY): Payer: Self-pay

## 2021-08-14 NOTE — Telephone Encounter (Signed)
Patient Advocate Encounter  Received notification from [facility]) that RENEWAL prior authorization for RYBELSUS 3MG  is required.   RENEWAL PA submitted on 12.14.22 Key  [id number] Status is pending   [facility] will continue to follow  12.16.22, CPhT Patient Advocate [facility] Phone: [phone] Fax:  [phone]

## 2021-08-15 ENCOUNTER — Other Ambulatory Visit (HOSPITAL_COMMUNITY): Payer: Self-pay

## 2021-08-15 NOTE — Telephone Encounter (Signed)
Patient Advocate Encounter  Prior Authorization for Rybelsus 3mg  tabs has been approved.    PA#:   Effective dates: 08/14/21 through 08/14/22  Per Test Claim Patients co-pay is $25.   Spoke with Pharmacy to Process.  Patient Advocate Fax:  [phone]

## 2021-08-21 ENCOUNTER — Other Ambulatory Visit: Payer: Self-pay | Admitting: Internal Medicine

## 2021-08-28 ENCOUNTER — Ambulatory Visit: Payer: No Typology Code available for payment source | Admitting: Internal Medicine

## 2021-09-03 ENCOUNTER — Other Ambulatory Visit: Payer: Self-pay | Admitting: Endocrinology

## 2021-09-06 ENCOUNTER — Other Ambulatory Visit: Payer: Self-pay | Admitting: Endocrinology

## 2021-09-06 DIAGNOSIS — E1165 Type 2 diabetes mellitus with hyperglycemia: Secondary | ICD-10-CM

## 2021-09-09 ENCOUNTER — Telehealth: Payer: Self-pay | Admitting: Endocrinology

## 2021-09-09 NOTE — Telephone Encounter (Signed)
Wife of patient called stating husband is experiencing abdominal pain for the pass 3 days. Not sure if due to medication (Rybelsus). Please call [phone] and advise. Patient will stop taking Rybelsus until call is received.

## 2021-09-10 NOTE — Telephone Encounter (Signed)
Spoke with pt's wife Gavin Pound and advised her per Everardo All to D/c Rybelsus and contact us within 3-7 business days to let us know how the BS were doing.

## 2021-09-30 ENCOUNTER — Other Ambulatory Visit: Payer: Self-pay | Admitting: Endocrinology

## 2021-10-16 ENCOUNTER — Other Ambulatory Visit: Payer: Self-pay | Admitting: Internal Medicine

## 2021-10-16 ENCOUNTER — Other Ambulatory Visit: Payer: Self-pay | Admitting: Endocrinology

## 2021-10-23 ENCOUNTER — Other Ambulatory Visit: Payer: Self-pay

## 2021-10-23 ENCOUNTER — Ambulatory Visit: Payer: No Typology Code available for payment source | Admitting: Internal Medicine

## 2021-10-23 ENCOUNTER — Encounter: Payer: Self-pay | Admitting: Internal Medicine

## 2021-10-23 DIAGNOSIS — I2583 Coronary atherosclerosis due to lipid rich plaque: Secondary | ICD-10-CM

## 2021-10-23 DIAGNOSIS — M79605 Pain in left leg: Secondary | ICD-10-CM

## 2021-10-23 DIAGNOSIS — M79604 Pain in right leg: Secondary | ICD-10-CM

## 2021-10-23 DIAGNOSIS — I2510 Atherosclerotic heart disease of native coronary artery without angina pectoris: Secondary | ICD-10-CM

## 2021-10-23 DIAGNOSIS — M545 Low back pain, unspecified: Secondary | ICD-10-CM

## 2021-10-23 DIAGNOSIS — E538 Deficiency of other specified B group vitamins: Secondary | ICD-10-CM

## 2021-10-23 DIAGNOSIS — R609 Edema, unspecified: Secondary | ICD-10-CM

## 2021-10-23 MED ORDER — MORPHINE SULFATE 15 MG PO TABS: 15.0000 mg | ORAL_TABLET | Freq: Two times a day (BID) | ORAL | 0 refills | Status: DC | PRN

## 2021-10-23 MED ORDER — MORPHINE SULFATE ER 100 MG PO TBCR: 200.0000 mg | EXTENDED_RELEASE_TABLET | Freq: Three times a day (TID) | ORAL | 0 refills | Status: DC

## 2021-10-23 NOTE — Assessment & Plan Note (Signed)
Start on Vitamin B12 injections.  Give 1000 mcg of Vitamin b12 sq daily x 1 week, then weekly for 1 month, then once every 2 weeks. Cont on B12

## 2021-10-23 NOTE — Progress Notes (Signed)
Subjective:  Patient ID: [name] [name], male    DOB: 1966/06/07  Age: 56 y.o. MRN: [id number]  CC: Follow-up   HPI [name] [name] presents for LBP, diabetes, skin rashes on lower extremities  Outpatient Medications Prior to Visit  Medication Sig Dispense Refill   acarbose (PRECOSE) 25 MG tablet take ONE tablet by MOUTH THREE times daily with meals 90 tablet 1   amLODipine-valsartan (EXFORGE) 10-320 MG tablet TAKE ONE TABLET EVERY DAY 90 tablet 2   aspirin EC 81 MG tablet Take 81 mg by mouth daily.     B-D ULTRAFINE III SHORT PEN 31G X 8 MM MISC USE AS DIRECTED THREE TIMES DAILY 100 each 0   Cholecalciferol (VITAMIN D3) 50 MCG (2000 UT) capsule Take 1 capsule (2,000 Units total) by mouth daily. 100 capsule 3   cyanocobalamin (,VITAMIN B-12,) 1000 MCG/ML injection Give 1000 mcg of Vitamin b12 sq daily x 1 week, then weekly for 1 month, then once every 2 weeks 10 mL 6   empagliflozin (JARDIANCE) 10 MG TABS tablet Take 1 tablet (10 mg total) by mouth daily before breakfast. 90 tablet 3   fluticasone (FLONASE) 50 MCG/ACT nasal spray SPRAY 2 SPRAYS INTO EACH NOSTRIL EVERY DAY 48 mL 1   furosemide (LASIX) 20 MG tablet Take 1 to 2 tablets (20-40 mg total) by mouth daily as needed. 60 tablet 3   insulin glargine, 2 Unit Dial, (TOUJEO MAX SOLOSTAR) 300 UNIT/ML Solostar Pen Inject 68 Units into the skin every morning. And pen needles 1/day. 72 mL 3   Lancets (ONETOUCH DELICA PLUS LANCET33G) MISC USE TO check blood sugar TWICE DAILY 100 each 3   lovastatin (MEVACOR) 20 MG tablet Take 1 tablet (20 mg total) by mouth daily. 90 tablet 3   metFORMIN (GLUCOPHAGE-XR) 500 MG 24 hr tablet TAKE ONE TABLET BY MOUTH EVERY MORNING with BREAKFAST 90 tablet 2   SYRINGE-NEEDLE, DISP, 3 ML (BD ECLIPSE SYRINGE) 25G X 1" 3 ML MISC Use for vitamin B12 shots as directed 50 each 3   umeclidinium-vilanterol (ANORO ELLIPTA) 62.5-25 MCG/ACT AEPB INHALE ONE PUFF BY MOUTH DAILY 60 each 5   Vitamin D, Ergocalciferol,  (DRISDOL) 1.25 MG (50000 UNIT) CAPS capsule Take 1 capsule (50,000 Units total) by mouth every 7 (seven) days. 8 capsule 0   doxycycline (VIBRA-TABS) 100 MG tablet Take 1 tablet (100 mg total) by mouth 2 (two) times daily. 28 tablet 0   morphine (MS CONTIN) 100 MG 12 hr tablet Take 2 tablets (200 mg total) by mouth 3 (three) times daily. 180 tablet 0   morphine (MS CONTIN) 100 MG 12 hr tablet Take 2 tablets (200 mg total) by mouth 3 (three) times daily. 180 tablet 0   morphine (MS CONTIN) 100 MG 12 hr tablet Take 2 tablets (200 mg total) by mouth 3 (three) times daily. 180 tablet 0   morphine (MSIR) 15 MG tablet Take 1 tablet (15 mg total) by mouth 2 (two) times daily as needed for severe pain. 60 tablet 0   morphine (MSIR) 15 MG tablet Take 1 tablet (15 mg total) by mouth 2 (two) times daily as needed for severe pain. 60 tablet 0   morphine (MSIR) 15 MG tablet Take 1 tablet (15 mg total) by mouth 2 (two) times daily as needed for severe pain. 60 tablet 0   ONETOUCH ULTRA test strip USE TO TEST BLOOD SUGAR TWICE DAILY 100 strip 0   RYBELSUS 14 MG TABS TAKE ONE TABLET BY MOUTH  DAILY 90 tablet 1   No facility-administered medications prior to visit.    ROS: Review of Systems  Constitutional:  Positive for fatigue. Negative for appetite change and unexpected weight change.  HENT:  Negative for congestion, nosebleeds, sneezing, sore throat and trouble swallowing.   Eyes:  Negative for itching and visual disturbance.  Respiratory:  Negative for cough.   Cardiovascular:  Negative for chest pain, palpitations and leg swelling.  Gastrointestinal:  Negative for abdominal distention, blood in stool, diarrhea and nausea.  Genitourinary:  Negative for frequency and hematuria.  Musculoskeletal:  Positive for arthralgias and back pain. Negative for gait problem, joint swelling and neck pain.  Skin:  Positive for color change. Negative for rash.  Neurological:  Negative for dizziness, tremors, speech  difficulty and weakness.  Psychiatric/Behavioral:  Negative for agitation, dysphoric mood, sleep disturbance and suicidal ideas. The patient is not nervous/anxious.    Objective:  BP 124/68    Pulse 71    Temp 98.1 F (36.7 C) (Oral)    Ht 5\' 9"  (1.753 m)    Wt 236 lb 8 oz (107.3 kg)    SpO2 96%    BMI 34.92 kg/m   BP Readings from Last 3 Encounters:  10/30/21 134/60  10/23/21 124/68  07/18/21 128/70    Wt Readings from Last 3 Encounters:  10/30/21 237 lb 12.8 oz (107.9 kg)  10/23/21 236 lb 8 oz (107.3 kg)  07/18/21 236 lb 12.8 oz (107.4 kg)    Physical Exam Constitutional:      General: He is not in acute distress.    Appearance: He is well-developed. He is obese.     Comments: NAD  Eyes:     Conjunctiva/sclera: Conjunctivae normal.     Pupils: Pupils are equal, round, and reactive to light.  Neck:     Thyroid: No thyromegaly.     Vascular: No JVD.  Cardiovascular:     Rate and Rhythm: Normal rate and regular rhythm.     Heart sounds: Normal heart sounds. No murmur heard.   No friction rub. No gallop.  Pulmonary:     Effort: Pulmonary effort is normal. No respiratory distress.     Breath sounds: Normal breath sounds. No wheezing or rales.  Chest:     Chest wall: No tenderness.  Abdominal:     General: Bowel sounds are normal. There is no distension.     Palpations: Abdomen is soft. There is no mass.     Tenderness: There is no abdominal tenderness. There is no guarding or rebound.  Musculoskeletal:        General: Tenderness present. Normal range of motion.     Cervical back: Normal range of motion.  Lymphadenopathy:     Cervical: No cervical adenopathy.  Skin:    General: Skin is warm and dry.     Findings: Lesion present. No rash.  Neurological:     Mental Status: He is alert and oriented to person, place, and time.     Cranial Nerves: No cranial nerve deficit.     Motor: No abnormal muscle tone.     Coordination: Coordination normal.     Gait: Gait  abnormal.     Deep Tendon Reflexes: Reflexes are normal and symmetric.  Psychiatric:        Behavior: Behavior normal.        Thought Content: Thought content normal.        Judgment: Judgment normal.  Antalgic gait.  Lumbar spine tender with  range of motion.  She seems to be leaning forward while walking.  Excoriations on the lower shins.  Lab Results  Component Value Date   WBC 8.9 05/02/2021   HGB 15.1 05/02/2021   HCT 45.0 05/02/2021   PLT 261.0 05/02/2021   GLUCOSE 173 (H) 05/02/2021   CHOL 129 05/02/2021   TRIG 110.0 05/02/2021   HDL 44.30 05/02/2021   LDLDIRECT 116.6 05/20/2007   LDLCALC 63 05/02/2021   ALT 13 05/02/2021   AST 15 05/02/2021   NA 133 (L) 05/02/2021   K 4.0 05/02/2021   CL 95 (L) 05/02/2021   CREATININE 0.82 05/02/2021   BUN 16 05/02/2021   CO2 30 05/02/2021   TSH 4.18 05/02/2021   PSA 0.20 04/23/2018   INR 1.1 05/10/2008   HGBA1C 7.8 (A) 07/16/2021   MICROALBUR <0.7 05/02/2021    VAS [place] LOWER EXTREMITY VENOUS (DVT)  Result Date: 05/10/2021  Lower Venous DVT Study Patient Name:  HURIEL PORTER  Date of Exam:   05/07/2021 Medical Rec #: [id number]         Accession #:    [id number] Date of Birth: October 29, 1965        Patient Gender: M Patient Age:   34 years Exam Location:  [facility] Procedure:      VAS [place] LOWER EXTREMITY VENOUS (DVT) Referring Phys: Tyrone Apple Antonia Jicha --------------------------------------------------------------------------------  Indications: Patient sctratched anterior and posterior right lower leg approximately 3 weeks ago. The lower leg is now swollen and has erythema. Patient denies chest pain and SOB but does have a positive D-dimer test.  Comparison Study: None Performing Technologist: Alecia Mackin RVT, RDCS (AE), RDMS  Examination Guidelines: A complete evaluation includes B-mode imaging, spectral Doppler, color Doppler, and power Doppler as needed of all accessible portions of each vessel. Bilateral testing is considered an integral  part of a complete examination. Limited examinations for reoccurring indications may be performed as noted. The reflux portion of the exam is performed with the patient in reverse Trendelenburg.  +---------+---------------+---------+-----------+----------+--------------+  RIGHT     Compressibility Phasicity Spontaneity Properties Thrombus Aging  +---------+---------------+---------+-----------+----------+--------------+  CFV       Full            Yes       Yes                                    +---------+---------------+---------+-----------+----------+--------------+  SFJ       Full            Yes       Yes                                    +---------+---------------+---------+-----------+----------+--------------+  FV Prox   Full            Yes       Yes                                    +---------+---------------+---------+-----------+----------+--------------+  FV Mid    Full            Yes       Yes                                    +---------+---------------+---------+-----------+----------+--------------+  FV Distal Full            Yes       Yes                                    +---------+---------------+---------+-----------+----------+--------------+  PFV       Full                                                             +---------+---------------+---------+-----------+----------+--------------+  POP       Full            Yes       Yes                                    +---------+---------------+---------+-----------+----------+--------------+  PTV       Full            Yes       Yes                                    +---------+---------------+---------+-----------+----------+--------------+  PERO      Full            Yes       Yes                                    +---------+---------------+---------+-----------+----------+--------------+  Gastroc   Full                                                             +---------+---------------+---------+-----------+----------+--------------+  GSV        Full            Yes       Yes                                    +---------+---------------+---------+-----------+----------+--------------+   +---------+---------------+---------+-----------+----------+--------------+  LEFT      Compressibility Phasicity Spontaneity Properties Thrombus Aging  +---------+---------------+---------+-----------+----------+--------------+  CFV       Full            Yes       Yes                                    +---------+---------------+---------+-----------+----------+--------------+  SFJ       Full            Yes       Yes                                    +---------+---------------+---------+-----------+----------+--------------+  FV Prox  Full            Yes       Yes                                    +---------+---------------+---------+-----------+----------+--------------+  FV Mid    Full            Yes       Yes                                    +---------+---------------+---------+-----------+----------+--------------+  FV Distal Full            Yes       Yes                                    +---------+---------------+---------+-----------+----------+--------------+  PFV       Full                                                             +---------+---------------+---------+-----------+----------+--------------+  POP       Full            Yes       Yes                                    +---------+---------------+---------+-----------+----------+--------------+  PTV       Full            Yes       Yes                                    +---------+---------------+---------+-----------+----------+--------------+  PERO      Full            Yes       Yes                                    +---------+---------------+---------+-----------+----------+--------------+  Gastroc   Full                                                             +---------+---------------+---------+-----------+----------+--------------+  GSV       Full            Yes       Yes                                     +---------+---------------+---------+-----------+----------+--------------+    Findings reported to Dr. Judeen Hammans email through [facility] at 2:30 pm.  Summary: RIGHT: - No evidence of deep vein thrombosis in the lower extremity. No indirect evidence  of obstruction proximal to the inguinal ligament. - No cystic structure found in the popliteal fossa. - Large groin lymph nodes, largest measuring 3.2 x .8 x 1.6 cm. Mild superficial edema noted throughout calf.  LEFT: - No evidence of deep vein thrombosis in the lower extremity. No indirect evidence of obstruction proximal to the inguinal ligament. - No cystic structure found in the popliteal fossa.  *See table(s) above for measurements and observations. Electronically signed by Quay Burow MD on 05/10/2021 at 7:31:55 AM.    Final     Assessment & Plan:   Problem List Items Addressed This Visit     B12 deficiency    Start on Vitamin B12 injections.  Give 1000 mcg of Vitamin b12 sq daily x 1 week, then weekly for 1 month, then once every 2 weeks. Cont on B12      CAD (coronary artery disease)    No angina at present.  Continue with lovastatin      Edema    It is better.  Continue Lasix.      Low back pain radiating to both legs    Chronic severe LBP, on opioids Continue with MSIR, MSContin   Potential benefits of a long term opioids  use as well as potential risks  and complications were explained to the patient and were aknowledged. He is maintaining stooped over posture when standing/walking      Relevant Medications   morphine (MS CONTIN) 100 MG 12 hr tablet   morphine (MS CONTIN) 100 MG 12 hr tablet   morphine (MS CONTIN) 100 MG 12 hr tablet   morphine (MSIR) 15 MG tablet   morphine (MSIR) 15 MG tablet   morphine (MSIR) 15 MG tablet      Meds ordered this encounter  Medications   morphine (MS CONTIN) 100 MG 12 hr tablet    Sig: Take 2 tablets (200 mg total) by mouth 3 (three) times daily.    Dispense:  180  tablet    Refill:  0    Fill on or after 10/31/21   morphine (MS CONTIN) 100 MG 12 hr tablet    Sig: Take 2 tablets (200 mg total) by mouth 3 (three) times daily.    Dispense:  180 tablet    Refill:  0    Fill on or after 11/30/21   morphine (MS CONTIN) 100 MG 12 hr tablet    Sig: Take 2 tablets (200 mg total) by mouth 3 (three) times daily.    Dispense:  180 tablet    Refill:  0    Fill on or after 12/30/21   morphine (MSIR) 15 MG tablet    Sig: Take 1 tablet (15 mg total) by mouth 2 (two) times daily as needed for severe pain.    Dispense:  60 tablet    Refill:  0    Please fill on or after 10/31/21   morphine (MSIR) 15 MG tablet    Sig: Take 1 tablet (15 mg total) by mouth 2 (two) times daily as needed for severe pain.    Dispense:  60 tablet    Refill:  0    Please fill on or after 12/30/21   morphine (MSIR) 15 MG tablet    Sig: Take 1 tablet (15 mg total) by mouth 2 (two) times daily as needed for severe pain.    Dispense:  60 tablet    Refill:  0    Please fill on or after 11/30/21  Follow-up: Return in about 3 months (around 01/20/2022).  Walker Kehr, MD

## 2021-10-30 ENCOUNTER — Ambulatory Visit: Payer: No Typology Code available for payment source | Admitting: Internal Medicine

## 2021-10-30 ENCOUNTER — Encounter: Payer: Self-pay | Admitting: Internal Medicine

## 2021-10-30 DIAGNOSIS — R609 Edema, unspecified: Secondary | ICD-10-CM | POA: Diagnosis not present

## 2021-10-30 DIAGNOSIS — L0390 Cellulitis, unspecified: Secondary | ICD-10-CM

## 2021-10-30 DIAGNOSIS — E1165 Type 2 diabetes mellitus with hyperglycemia: Secondary | ICD-10-CM | POA: Diagnosis not present

## 2021-10-30 MED ORDER — MUPIROCIN 2 % EX OINT: TOPICAL_OINTMENT | CUTANEOUS | 0 refills | Status: DC

## 2021-10-30 MED ORDER — DOXYCYCLINE HYCLATE 100 MG PO TABS: 100.0000 mg | ORAL_TABLET | Freq: Two times a day (BID) | ORAL | 0 refills | Status: DC

## 2021-10-30 NOTE — Assessment & Plan Note (Addendum)
Worse ?On Furosemide 40 mg/d ?Foam wedge ?Compression sleeves ?

## 2021-10-30 NOTE — Patient Instructions (Signed)
Foam wedge ?Compression sleeves ?

## 2021-10-30 NOTE — Progress Notes (Signed)
Subjective:  Patient ID: [name] [name], male    DOB: Apr 28, 1966  Age: 56 y.o. MRN: [id number]  CC: Leg Swelling (Right leg swelling for about 2-3 days. Pt has used antibiotic ointment but has not seemed to help. )   HPI [name] [name] presents for R leg cellulitis, swelling  Outpatient Medications Prior to Visit  Medication Sig Dispense Refill   acarbose (PRECOSE) 25 MG tablet take ONE tablet by MOUTH THREE times daily with meals 90 tablet 1   amLODipine-valsartan (EXFORGE) 10-320 MG tablet TAKE ONE TABLET EVERY DAY 90 tablet 2   aspirin EC 81 MG tablet Take 81 mg by mouth daily.     B-D ULTRAFINE III SHORT PEN 31G X 8 MM MISC USE AS DIRECTED THREE TIMES DAILY 100 each 0   Cholecalciferol (VITAMIN D3) 50 MCG (2000 UT) capsule Take 1 capsule (2,000 Units total) by mouth daily. 100 capsule 3   cyanocobalamin (,VITAMIN B-12,) 1000 MCG/ML injection Give 1000 mcg of Vitamin b12 sq daily x 1 week, then weekly for 1 month, then once every 2 weeks 10 mL 6   empagliflozin (JARDIANCE) 10 MG TABS tablet Take 1 tablet (10 mg total) by mouth daily before breakfast. 90 tablet 3   fluticasone (FLONASE) 50 MCG/ACT nasal spray SPRAY 2 SPRAYS INTO EACH NOSTRIL EVERY DAY 48 mL 1   furosemide (LASIX) 20 MG tablet Take 1 to 2 tablets (20-40 mg total) by mouth daily as needed. 60 tablet 3   insulin glargine, 2 Unit Dial, (TOUJEO MAX SOLOSTAR) 300 UNIT/ML Solostar Pen Inject 68 Units into the skin every morning. And pen needles 1/day. 72 mL 3   Lancets (ONETOUCH DELICA PLUS LANCET33G) MISC USE TO check blood sugar TWICE DAILY 100 each 3   lovastatin (MEVACOR) 20 MG tablet Take 1 tablet (20 mg total) by mouth daily. 90 tablet 3   metFORMIN (GLUCOPHAGE-XR) 500 MG 24 hr tablet TAKE ONE TABLET BY MOUTH EVERY MORNING with BREAKFAST 90 tablet 2   morphine (MS CONTIN) 100 MG 12 hr tablet Take 2 tablets (200 mg total) by mouth 3 (three) times daily. 180 tablet 0   morphine (MS CONTIN) 100 MG 12 hr tablet Take 2  tablets (200 mg total) by mouth 3 (three) times daily. 180 tablet 0   morphine (MS CONTIN) 100 MG 12 hr tablet Take 2 tablets (200 mg total) by mouth 3 (three) times daily. 180 tablet 0   morphine (MSIR) 15 MG tablet Take 1 tablet (15 mg total) by mouth 2 (two) times daily as needed for severe pain. 60 tablet 0   morphine (MSIR) 15 MG tablet Take 1 tablet (15 mg total) by mouth 2 (two) times daily as needed for severe pain. 60 tablet 0   morphine (MSIR) 15 MG tablet Take 1 tablet (15 mg total) by mouth 2 (two) times daily as needed for severe pain. 60 tablet 0   ONETOUCH ULTRA test strip USE TO TEST BLOOD SUGAR TWICE DAILY 100 strip 0   RYBELSUS 14 MG TABS TAKE ONE TABLET BY MOUTH DAILY 90 tablet 1   SYRINGE-NEEDLE, DISP, 3 ML (BD ECLIPSE SYRINGE) 25G X 1" 3 ML MISC Use for vitamin B12 shots as directed 50 each 3   umeclidinium-vilanterol (ANORO ELLIPTA) 62.5-25 MCG/ACT AEPB INHALE ONE PUFF BY MOUTH DAILY 60 each 5   Vitamin D, Ergocalciferol, (DRISDOL) 1.25 MG (50000 UNIT) CAPS capsule Take 1 capsule (50,000 Units total) by mouth every 7 (seven) days. 8 capsule 0  doxycycline (VIBRA-TABS) 100 MG tablet Take 1 tablet (100 mg total) by mouth 2 (two) times daily. 28 tablet 0   No facility-administered medications prior to visit.    ROS: Review of Systems  Constitutional:  Positive for chills. Negative for appetite change, fatigue and unexpected weight change.  HENT:  Negative for congestion, nosebleeds, sneezing, sore throat and trouble swallowing.   Eyes:  Negative for itching and visual disturbance.  Respiratory:  Negative for cough.   Cardiovascular:  Positive for leg swelling. Negative for chest pain and palpitations.  Gastrointestinal:  Negative for abdominal distention, blood in stool, diarrhea and nausea.  Genitourinary:  Negative for frequency and hematuria.  Musculoskeletal:  Negative for back pain, gait problem, joint swelling and neck pain.  Skin:  Positive for color change, rash  and wound.  Neurological:  Negative for dizziness, tremors, speech difficulty and weakness.  Psychiatric/Behavioral:  Negative for agitation, dysphoric mood and sleep disturbance. The patient is not nervous/anxious.    Objective:  BP 134/60 (BP Location: Left Arm, Patient Position: Sitting, Cuff Size: Large)    Pulse (!) 38    Temp 97.9 F (36.6 C) (Oral)    Ht 5\' 9"  (1.753 m)    Wt 237 lb 12.8 oz (107.9 kg)    SpO2 100%    BMI 35.12 kg/m   BP Readings from Last 3 Encounters:  10/30/21 134/60  10/23/21 124/68  07/18/21 128/70    Wt Readings from Last 3 Encounters:  10/30/21 237 lb 12.8 oz (107.9 kg)  10/23/21 236 lb 8 oz (107.3 kg)  07/18/21 236 lb 12.8 oz (107.4 kg)    Physical Exam Constitutional:      General: He is not in acute distress.    Appearance: He is well-developed.     Comments: NAD  Eyes:     Conjunctiva/sclera: Conjunctivae normal.     Pupils: Pupils are equal, round, and reactive to light.  Neck:     Thyroid: No thyromegaly.     Vascular: No JVD.  Cardiovascular:     Rate and Rhythm: Normal rate and regular rhythm.     Heart sounds: Normal heart sounds. No murmur heard.   No friction rub. No gallop.  Pulmonary:     Effort: Pulmonary effort is normal. No respiratory distress.     Breath sounds: Normal breath sounds. No wheezing or rales.  Chest:     Chest wall: No tenderness.  Abdominal:     General: Bowel sounds are normal. There is no distension.     Palpations: Abdomen is soft. There is no mass.     Tenderness: There is no abdominal tenderness. There is no guarding or rebound.  Musculoskeletal:        General: No tenderness. Normal range of motion.     Cervical back: Normal range of motion.  Lymphadenopathy:     Cervical: No cervical adenopathy.  Skin:    General: Skin is warm and dry.     Findings: Erythema and lesion present. No rash.  Neurological:     Mental Status: He is alert and oriented to person, place, and time.     Cranial Nerves: No  cranial nerve deficit.     Motor: No abnormal muscle tone.     Coordination: Coordination normal.     Gait: Gait normal.     Deep Tendon Reflexes: Reflexes are normal and symmetric.  Psychiatric:        Behavior: Behavior normal.  Thought Content: Thought content normal.        Judgment: Judgment normal.   Erythematous right distal leg with four 2x1 cm erosions, scattered Lab Results  Component Value Date   WBC 8.9 05/02/2021   HGB 15.1 05/02/2021   HCT 45.0 05/02/2021   PLT 261.0 05/02/2021   GLUCOSE 173 (H) 05/02/2021   CHOL 129 05/02/2021   TRIG 110.0 05/02/2021   HDL 44.30 05/02/2021   LDLDIRECT 116.6 05/20/2007   LDLCALC 63 05/02/2021   ALT 13 05/02/2021   AST 15 05/02/2021   NA 133 (L) 05/02/2021   K 4.0 05/02/2021   CL 95 (L) 05/02/2021   CREATININE 0.82 05/02/2021   BUN 16 05/02/2021   CO2 30 05/02/2021   TSH 4.18 05/02/2021   PSA 0.20 04/23/2018   INR 1.1 05/10/2008   HGBA1C 7.8 (A) 07/16/2021   MICROALBUR <0.7 05/02/2021    VAS [place] LOWER EXTREMITY VENOUS (DVT)  Result Date: 05/10/2021  Lower Venous DVT Study Patient Name:  [name] [name]  Date of Exam:   05/07/2021 Medical Rec #: [id number]         Accession #:    [id number] Date of Birth: Jun 23, 1966        Patient Gender: M Patient Age:   37 years Exam Location:  [facility] Procedure:      VAS [place] LOWER EXTREMITY VENOUS (DVT) Referring Phys: Tyrone Apple Zacaria Pousson --------------------------------------------------------------------------------  Indications: Patient sctratched anterior and posterior right lower leg approximately 3 weeks ago. The lower leg is now swollen and has erythema. Patient denies chest pain and SOB but does have a positive D-dimer test.  Comparison Study: None Performing Technologist: Alecia Mackin RVT, RDCS (AE), RDMS  Examination Guidelines: A complete evaluation includes B-mode imaging, spectral Doppler, color Doppler, and power Doppler as needed of all accessible portions of each vessel.  Bilateral testing is considered an integral part of a complete examination. Limited examinations for reoccurring indications may be performed as noted. The reflux portion of the exam is performed with the patient in reverse Trendelenburg.  +---------+---------------+---------+-----------+----------+--------------+  RIGHT     Compressibility Phasicity Spontaneity Properties Thrombus Aging  +---------+---------------+---------+-----------+----------+--------------+  CFV       Full            Yes       Yes                                    +---------+---------------+---------+-----------+----------+--------------+  SFJ       Full            Yes       Yes                                    +---------+---------------+---------+-----------+----------+--------------+  FV Prox   Full            Yes       Yes                                    +---------+---------------+---------+-----------+----------+--------------+  FV Mid    Full            Yes       Yes                                    +---------+---------------+---------+-----------+----------+--------------+  FV Distal Full            Yes       Yes                                    +---------+---------------+---------+-----------+----------+--------------+  PFV       Full                                                             +---------+---------------+---------+-----------+----------+--------------+  POP       Full            Yes       Yes                                    +---------+---------------+---------+-----------+----------+--------------+  PTV       Full            Yes       Yes                                    +---------+---------------+---------+-----------+----------+--------------+  PERO      Full            Yes       Yes                                    +---------+---------------+---------+-----------+----------+--------------+  Gastroc   Full                                                              +---------+---------------+---------+-----------+----------+--------------+  GSV       Full            Yes       Yes                                    +---------+---------------+---------+-----------+----------+--------------+   +---------+---------------+---------+-----------+----------+--------------+  LEFT      Compressibility Phasicity Spontaneity Properties Thrombus Aging  +---------+---------------+---------+-----------+----------+--------------+  CFV       Full            Yes       Yes                                    +---------+---------------+---------+-----------+----------+--------------+  SFJ       Full            Yes       Yes                                    +---------+---------------+---------+-----------+----------+--------------+  FV Prox  Full            Yes       Yes                                    +---------+---------------+---------+-----------+----------+--------------+  FV Mid    Full            Yes       Yes                                    +---------+---------------+---------+-----------+----------+--------------+  FV Distal Full            Yes       Yes                                    +---------+---------------+---------+-----------+----------+--------------+  PFV       Full                                                             +---------+---------------+---------+-----------+----------+--------------+  POP       Full            Yes       Yes                                    +---------+---------------+---------+-----------+----------+--------------+  PTV       Full            Yes       Yes                                    +---------+---------------+---------+-----------+----------+--------------+  PERO      Full            Yes       Yes                                    +---------+---------------+---------+-----------+----------+--------------+  Gastroc   Full                                                              +---------+---------------+---------+-----------+----------+--------------+  GSV       Full            Yes       Yes                                    +---------+---------------+---------+-----------+----------+--------------+    Findings reported to Dr. Judeen Hammans email through [facility] at 2:30 pm.  Summary: RIGHT: - No evidence of deep vein thrombosis in the lower extremity. No indirect evidence  of obstruction proximal to the inguinal ligament. - No cystic structure found in the popliteal fossa. - Large groin lymph nodes, largest measuring 3.2 x .8 x 1.6 cm. Mild superficial edema noted throughout calf.  LEFT: - No evidence of deep vein thrombosis in the lower extremity. No indirect evidence of obstruction proximal to the inguinal ligament. - No cystic structure found in the popliteal fossa.  *See table(s) above for measurements and observations. Electronically signed by Quay Burow MD on 05/10/2021 at 7:31:55 AM.    Final     Assessment & Plan:   Problem List Items Addressed This Visit     Cellulitis    Foam wedge Compression sleeves Doxy x 2 weeks Mupirocin oint on ulcers Treat edema      Edema    Worse On Furosemide 40 mg/d Foam wedge Compression sleeves      Uncontrolled type 2 diabetes mellitus with hyperglycemia (HCC)    Monitor CBGs more closely this week.  Call if problems         Meds ordered this encounter  Medications   doxycycline (VIBRA-TABS) 100 MG tablet    Sig: Take 1 tablet (100 mg total) by mouth 2 (two) times daily.    Dispense:  28 tablet    Refill:  0   mupirocin ointment (BACTROBAN) 2 %    Sig: On leg wound w/dressing change bid    Dispense:  30 g    Refill:  0      Follow-up: Return for a follow-up visit.  Walker Kehr, MD

## 2021-10-30 NOTE — Assessment & Plan Note (Addendum)
Foam wedge ?Compression sleeves ?Doxy x 2 weeks ?Mupirocin oint on ulcers ?Treat edema ?

## 2021-10-31 ENCOUNTER — Ambulatory Visit: Payer: No Typology Code available for payment source | Admitting: Internal Medicine

## 2021-10-31 ENCOUNTER — Other Ambulatory Visit: Payer: Self-pay | Admitting: Endocrinology

## 2021-10-31 DIAGNOSIS — E1165 Type 2 diabetes mellitus with hyperglycemia: Secondary | ICD-10-CM

## 2021-10-31 NOTE — Assessment & Plan Note (Signed)
Monitor CBGs more closely this week.  Call if problems ?

## 2021-11-11 NOTE — Assessment & Plan Note (Signed)
It is better.  Continue Lasix. ?

## 2021-11-11 NOTE — Assessment & Plan Note (Signed)
No angina at present.  Continue with lovastatin ?

## 2021-11-11 NOTE — Assessment & Plan Note (Signed)
Chronic severe LBP, on opioids Continue with MSIR, MSContin   Potential benefits of a long term opioids  use as well as potential risks  and complications were explained to the patient and were aknowledged. He is maintaining stooped over posture when standing/walking 

## 2021-11-13 ENCOUNTER — Other Ambulatory Visit: Payer: Self-pay

## 2021-11-13 ENCOUNTER — Ambulatory Visit: Payer: No Typology Code available for payment source | Admitting: Endocrinology

## 2021-11-13 ENCOUNTER — Encounter: Payer: Self-pay | Admitting: Endocrinology

## 2021-11-13 VITALS — BP 140/70 | HR 38 | Ht 69.0 in | Wt 242.0 lb

## 2021-11-13 DIAGNOSIS — E1165 Type 2 diabetes mellitus with hyperglycemia: Secondary | ICD-10-CM | POA: Diagnosis not present

## 2021-11-13 MED ORDER — RYBELSUS 3 MG PO TABS: 3.0000 mg | ORAL_TABLET | ORAL | 3 refills | Status: DC

## 2021-11-13 NOTE — Progress Notes (Signed)
? ?Subjective:  ? ? Patient ID: [name] [name], male    DOB: 05-Jun-1966, 56 y.o.   MRN: [id number] ? ?HPI ?Pt returns for f/u of diabetes mellitus:  ?DM type: Insulin-requiring type 2.   ?Dx'ed: 2005 ?Complications: CAD ?Therapy: insulin since 2007, and 4 oral meds.   ?DKA: never ?Severe hypoglycemia: never.  ?Pancreatitis: never.   ?Other: he takes QD insulin, due to h/o noncompliance; he works M-F, as a Haematologist; edema limited rx options; He says cbg's on work days=days off; he did not tolerate Rybelsus (pain at lower abd).   ?Interval history: no cbg record, but states cbg's vary from 65-190.  He seldom has hypoglycemia, and these episodes are mild.  It is in general lowest fasting, or when a meal is delayed.  He says he never misses meds.  He would like to try resuming Rybelsus at a lower dosage.   ?Past Medical History:  ?Diagnosis Date  ? COPD (chronic obstructive pulmonary disease) (HCC)   ? Diabetes mellitus   ? GERD (gastroesophageal reflux disease)   ? Hyperlipidemia   ? Hypertension   ? Lower back pain   ? Nephrolithiasis   ? Dr Annabell Howells  ? ? ?Past Surgical History:  ?Procedure Laterality Date  ? KNEE SURGERY    ? ? ?Social History  ? ?Socioeconomic History  ? Marital status: Married  ?  Spouse name: Not on file  ? Number of children: 2  ? Years of education: Not on file  ? Highest education level: Not on file  ?Occupational History  ? Not on file  ?Tobacco Use  ? Smoking status: Every Day  ?  Packs/day: 2.00  ?  Types: Cigarettes  ? Smokeless tobacco: Never  ?Substance and Sexual Activity  ? Alcohol use: No  ? Drug use: No  ? Sexual activity: Yes  ?Other Topics Concern  ? Not on file  ?Social History Narrative  ? No regular exercise  ? ?Social Determinants of Health  ? ?Financial Resource Strain: Not on file  ?Food Insecurity: Not on file  ?Transportation Needs: Not on file  ?Physical Activity: Not on file  ?Stress: Not on file  ?Social Connections: Not on file  ?Intimate Partner Violence: Not  on file  ? ? ?Current Outpatient Medications on File Prior to Visit  ?Medication Sig Dispense Refill  ? acarbose (PRECOSE) 25 MG tablet take ONE tablet by MOUTH THREE times daily with meals 90 tablet 1  ? amLODipine-valsartan (EXFORGE) 10-320 MG tablet TAKE ONE TABLET EVERY DAY 90 tablet 2  ? aspirin EC 81 MG tablet Take 81 mg by mouth daily.    ? B-D ULTRAFINE III SHORT PEN 31G X 8 MM MISC USE AS DIRECTED THREE TIMES DAILY 100 each 0  ? Cholecalciferol (VITAMIN D3) 50 MCG (2000 UT) capsule Take 1 capsule (2,000 Units total) by mouth daily. 100 capsule 3  ? cyanocobalamin (,VITAMIN B-12,) 1000 MCG/ML injection Give 1000 mcg of Vitamin b12 sq daily x 1 week, then weekly for 1 month, then once every 2 weeks 10 mL 6  ? doxycycline (VIBRA-TABS) 100 MG tablet Take 1 tablet (100 mg total) by mouth 2 (two) times daily. 28 tablet 0  ? empagliflozin (JARDIANCE) 10 MG TABS tablet Take 1 tablet (10 mg total) by mouth daily before breakfast. 90 tablet 3  ? fluticasone (FLONASE) 50 MCG/ACT nasal spray SPRAY 2 SPRAYS INTO EACH NOSTRIL EVERY DAY 48 mL 1  ? furosemide (LASIX) 20 MG tablet Take 1 to  2 tablets (20-40 mg total) by mouth daily as needed. 60 tablet 3  ? insulin glargine, 2 Unit Dial, (TOUJEO MAX SOLOSTAR) 300 UNIT/ML Solostar Pen Inject 68 Units into the skin every morning. And pen needles 1/day. 72 mL 3  ? Lancets (ONETOUCH DELICA PLUS LANCET33G) MISC USE TO check blood sugar TWICE DAILY 100 each 3  ? lovastatin (MEVACOR) 20 MG tablet Take 1 tablet (20 mg total) by mouth daily. 90 tablet 3  ? metFORMIN (GLUCOPHAGE-XR) 500 MG 24 hr tablet TAKE ONE TABLET BY MOUTH EVERY MORNING with BREAKFAST 90 tablet 2  ? morphine (MS CONTIN) 100 MG 12 hr tablet Take 2 tablets (200 mg total) by mouth 3 (three) times daily. 180 tablet 0  ? morphine (MS CONTIN) 100 MG 12 hr tablet Take 2 tablets (200 mg total) by mouth 3 (three) times daily. 180 tablet 0  ? morphine (MS CONTIN) 100 MG 12 hr tablet Take 2 tablets (200 mg total) by mouth 3  (three) times daily. 180 tablet 0  ? morphine (MSIR) 15 MG tablet Take 1 tablet (15 mg total) by mouth 2 (two) times daily as needed for severe pain. 60 tablet 0  ? morphine (MSIR) 15 MG tablet Take 1 tablet (15 mg total) by mouth 2 (two) times daily as needed for severe pain. 60 tablet 0  ? morphine (MSIR) 15 MG tablet Take 1 tablet (15 mg total) by mouth 2 (two) times daily as needed for severe pain. 60 tablet 0  ? mupirocin ointment (BACTROBAN) 2 % On leg wound w/dressing change bid 30 g 0  ? ONETOUCH ULTRA test strip USE TO TEST BLOOD SUGAR TWICE DAILY 100 strip 0  ? SYRINGE-NEEDLE, DISP, 3 ML (BD ECLIPSE SYRINGE) 25G X 1" 3 ML MISC Use for vitamin B12 shots as directed 50 each 3  ? umeclidinium-vilanterol (ANORO ELLIPTA) 62.5-25 MCG/ACT AEPB INHALE ONE PUFF BY MOUTH DAILY 60 each 5  ? Vitamin D, Ergocalciferol, (DRISDOL) 1.25 MG (50000 UNIT) CAPS capsule Take 1 capsule (50,000 Units total) by mouth every 7 (seven) days. 8 capsule 0  ? ?No current facility-administered medications on file prior to visit.  ? ? ?Allergies  ?Allergen Reactions  ? Belviq [Lorcaserin Hcl]   ?  wierd  ? Benazepril Hcl   ?  REACTION: cough  ? Nsaids   ?  Can take short term ?Upset stomach  ? ? ?Family History  ?Problem Relation Age of Onset  ? Hypertension Mother   ? Diabetes Mother   ? CAD Mother   ? Heart disease Other   ?     CAD male relative <60  ? Vision loss Sister   ? ? ?BP 140/70 (BP Location: Left Arm, Patient Position: Sitting, Cuff Size: Normal)   Pulse (!) 38   Ht 5\' 9"  (1.753 m)   Wt 242 lb (109.8 kg)   SpO2 100%   BMI 35.74 kg/m?  ? ? ?Review of Systems ? ?   ?Objective:  ? Physical Exam ? ? ? ? ?   ?Assessment & Plan:  ?Insulin-requiring type 2 DM: uncontrolled.  ? ?Patient Instructions  ?I have sent a prescription to your pharmacy, to resume the Rybelsus at a lower amount.   ?Please continue the same insulin and other diabetes medications.   ?check your blood sugar twice a day.  vary the time of day when you  check, between before the 3 meals, and at bedtime.  also check if you have symptoms of your blood  sugar being too high or too low.  please keep a record of the readings and bring it to your next appointment here.  You can write it on any piece of paper.  please call us sooner if your blood sugar goes below 70, or if you have a lot of readings over 200.   ?On this type of insulin schedule, you should eat meals on a regular schedule (especially lunch).  If a meal is missed or significantly delayed, your blood sugar could go low.   ?Please come back for a follow-up appointment in 4 months.  ? ? ?

## 2021-11-13 NOTE — Patient Instructions (Addendum)
I have sent a prescription to your pharmacy, to resume the Rybelsus at a lower amount.   ?Please continue the same insulin and other diabetes medications.   ?check your blood sugar twice a day.  vary the time of day when you check, between before the 3 meals, and at bedtime.  also check if you have symptoms of your blood sugar being too high or too low.  please keep a record of the readings and bring it to your next appointment here.  You can write it on any piece of paper.  please call us sooner if your blood sugar goes below 70, or if you have a lot of readings over 200.   ?On this type of insulin schedule, you should eat meals on a regular schedule (especially lunch).  If a meal is missed or significantly delayed, your blood sugar could go low.   ?Please come back for a follow-up appointment in 4 months.  ?

## 2021-11-30 ENCOUNTER — Other Ambulatory Visit: Payer: Self-pay | Admitting: Endocrinology

## 2021-12-18 ENCOUNTER — Other Ambulatory Visit: Payer: Self-pay

## 2021-12-18 ENCOUNTER — Emergency Department (HOSPITAL_BASED_OUTPATIENT_CLINIC_OR_DEPARTMENT_OTHER)
Admission: EM | Admit: 2021-12-18 | Discharge: 2021-12-18 | Disposition: A | Discharge status: Home / Self Care | Payer: No Typology Code available for payment source | Attending: Emergency Medicine | Admitting: Emergency Medicine

## 2021-12-18 ENCOUNTER — Encounter (HOSPITAL_BASED_OUTPATIENT_CLINIC_OR_DEPARTMENT_OTHER): Payer: Self-pay | Admitting: Emergency Medicine

## 2021-12-18 DIAGNOSIS — Z7982 Long term (current) use of aspirin: Secondary | ICD-10-CM | POA: Insufficient documentation

## 2021-12-18 DIAGNOSIS — M25422 Effusion, left elbow: Secondary | ICD-10-CM | POA: Diagnosis present

## 2021-12-18 DIAGNOSIS — M7022 Olecranon bursitis, left elbow: Secondary | ICD-10-CM

## 2021-12-18 DIAGNOSIS — I10 Essential (primary) hypertension: Secondary | ICD-10-CM

## 2021-12-18 MED ORDER — IBUPROFEN 400 MG PO TABS
ORAL_TABLET | ORAL | Status: AC
  Filled 2021-12-18: qty 1

## 2021-12-18 MED ORDER — AMOXICILLIN-POT CLAVULANATE 875-125 MG PO TABS: 1.0000 | ORAL_TABLET | Freq: Two times a day (BID) | ORAL | 0 refills | Status: DC

## 2021-12-18 MED ORDER — IBUPROFEN 400 MG PO TABS
400.0000 mg | ORAL_TABLET | Freq: Once | ORAL | Status: AC
  Administered 2021-12-18: 400 mg via ORAL
  Filled 2021-12-18: qty 1

## 2021-12-18 MED ORDER — AMOXICILLIN-POT CLAVULANATE 875-125 MG PO TABS
1.0000 | ORAL_TABLET | Freq: Once | ORAL | Status: AC
Start: 2021-12-18 — End: 2021-12-18
  Administered 2021-12-18: 1 via ORAL
  Filled 2021-12-18: qty 1

## 2021-12-18 NOTE — Discharge Instructions (Addendum)
It was our pleasure to provide your ER care today - we hope that you feel better. ? ?Keep skin of area very clean, and avoid any pressure on elbow.  ? ?Icepack/coldpack to sore area.  ? ?Take antibiotic as prescribed.  ? ?Take acetaminophen or ibprofen as need.  ? ?Follow up with orthopedist in one week if symptoms fail to improve/resolve. ? ?Follow up with your doctor regarding your blood pressure which is mildly high today.  ? ?Return to ER if worse, new symptoms, fevers, spreading redness, severe/worsening pain, or other concern.  ?

## 2021-12-18 NOTE — ED Provider Notes (Signed)
?[facility]PT ?Provider Note ? ? ?CSN: [id number] ?Arrival date & time: 12/18/21  1847 ? ?  ? ?History ? ?Chief Complaint  ?Patient presents with  ? Joint Swelling  ? ? ?[name] [name] is a 56 y.o. male. ? ?Pt c/o pain, swelling to left elbow. Notes prior cellulitis to area. No recent antibiotic therapy. Symptoms present for past 2-3 weeks, gradual onset, moderate, persistent, slowly worse. No fever or chills. Systemically does not feel ill or sick, no chills or sweats, no weakness, no nausea/vomiting. Denies other joint pain or swelling. Denies abrasions/lacs to area. Works as Development worker, community.  ? ?The history is provided by the patient, the spouse and medical records.  ? ?  ? ?Home Medications ?Prior to Admission medications   ?Medication Sig Start Date End Date Taking? Authorizing Provider  ?acarbose (PRECOSE) 25 MG tablet take ONE tablet by MOUTH THREE times daily with meals 12/02/21   Renato Shin, MD  ?amLODipine-valsartan (EXFORGE) 10-320 MG tablet TAKE ONE TABLET EVERY DAY 07/01/21   Plotnikov, Evie Lacks, MD  ?aspirin EC 81 MG tablet Take 81 mg by mouth daily.    [provider]  ?B-D ULTRAFINE III SHORT PEN 31G X 8 MM MISC USE AS DIRECTED THREE TIMES DAILY 12/27/19   Renato Shin, MD  ?Cholecalciferol (VITAMIN D3) 50 MCG (2000 UT) capsule Take 1 capsule (2,000 Units total) by mouth daily. 05/03/21   Plotnikov, Evie Lacks, MD  ?cyanocobalamin (,VITAMIN B-12,) 1000 MCG/ML injection Give 1000 mcg of Vitamin b12 sq daily x 1 week, then weekly for 1 month, then once every 2 weeks 05/03/21   Plotnikov, Evie Lacks, MD  ?doxycycline (VIBRA-TABS) 100 MG tablet Take 1 tablet (100 mg total) by mouth 2 (two) times daily. 10/30/21   Plotnikov, Evie Lacks, MD  ?empagliflozin (JARDIANCE) 10 MG TABS tablet Take 1 tablet (10 mg total) by mouth daily before breakfast. 03/12/21   Renato Shin, MD  ?fluticasone [facility]) 50 MCG/ACT nasal spray SPRAY 2 SPRAYS INTO EACH NOSTRIL EVERY DAY 02/20/20   Plotnikov,  Evie Lacks, MD  ?furosemide (LASIX) 20 MG tablet Take 1 to 2 tablets (20-40 mg total) by mouth daily as needed. 08/21/21   Plotnikov, Evie Lacks, MD  ?insulin glargine, 2 Unit Dial, (TOUJEO MAX SOLOSTAR) 300 UNIT/ML Solostar Pen Inject 68 Units into the skin every morning. And pen needles 1/day. 03/12/21   Renato Shin, MD  ?Lancets [place] [place] DELICA PLUS [id number]) MISC USE TO check blood sugar TWICE DAILY 10/18/21   Plotnikov, Evie Lacks, MD  ?lovastatin (MEVACOR) 20 MG tablet Take 1 tablet (20 mg total) by mouth daily. 04/24/21   Plotnikov, Evie Lacks, MD  ?metFORMIN (GLUCOPHAGE-XR) 500 MG 24 hr tablet TAKE ONE TABLET BY MOUTH EVERY MORNING with BREAKFAST 10/16/21   Renato Shin, MD  ?morphine (MS CONTIN) 100 MG 12 hr tablet Take 2 tablets (200 mg total) by mouth 3 (three) times daily. 10/23/21   Plotnikov, Evie Lacks, MD  ?morphine (MS CONTIN) 100 MG 12 hr tablet Take 2 tablets (200 mg total) by mouth 3 (three) times daily. 10/23/21   Plotnikov, Evie Lacks, MD  ?morphine (MS CONTIN) 100 MG 12 hr tablet Take 2 tablets (200 mg total) by mouth 3 (three) times daily. 10/23/21   Plotnikov, Evie Lacks, MD  ?morphine (MSIR) 15 MG tablet Take 1 tablet (15 mg total) by mouth 2 (two) times daily as needed for severe pain. 10/23/21   Plotnikov, Evie Lacks, MD  ?morphine (MSIR) 15 MG tablet Take 1 tablet (  15 mg total) by mouth 2 (two) times daily as needed for severe pain. 10/23/21   Plotnikov, Evie Lacks, MD  ?morphine (MSIR) 15 MG tablet Take 1 tablet (15 mg total) by mouth 2 (two) times daily as needed for severe pain. 10/23/21   Plotnikov, Evie Lacks, MD  ?mupirocin ointment (BACTROBAN) 2 % On leg wound w/dressing change bid 10/30/21   Plotnikov, Evie Lacks, MD  ?[facility] ULTRA test strip USE TO TEST BLOOD SUGAR TWICE DAILY 10/31/21   Renato Shin, MD  ?Semaglutide (RYBELSUS) 3 MG TABS Take 3 mg by mouth every morning. 11/13/21   Renato Shin, MD  ?SYRINGE-NEEDLE, DISP, 3 ML (BD ECLIPSE SYRINGE) 25G X 1" 3 ML MISC Use for vitamin B12 shots as  directed 05/03/21   Plotnikov, Evie Lacks, MD  ?umeclidinium-vilanterol (ANORO ELLIPTA) 62.5-25 MCG/ACT AEPB INHALE ONE PUFF BY MOUTH DAILY 07/01/21   Plotnikov, Evie Lacks, MD  ?Vitamin D, Ergocalciferol, (DRISDOL) 1.25 MG (50000 UNIT) CAPS capsule Take 1 capsule (50,000 Units total) by mouth every 7 (seven) days. 05/03/21   Plotnikov, Evie Lacks, MD  ?   ? ?Allergies    ?Belviq [lorcaserin hcl], Benazepril hcl, and Nsaids   ? ?Review of Systems   ?Review of Systems  ?Constitutional:  Negative for chills and fever.  ?Gastrointestinal:  Negative for nausea and vomiting.  ?Musculoskeletal:   ?     Left elbow pain/swelling/redness in olecranon area.   ?Skin:  Negative for wound.  ?Neurological:  Negative for weakness and numbness.  ?Hematological:  Does not bruise/bleed easily.  ? ?Physical Exam ?Updated Vital Signs ?BP (!) 142/95 (BP Location: Right Arm)   Pulse 79   Temp 99.2 ?F (37.3 ?C)   Resp 16   SpO2 97%  ?Physical Exam ?Vitals and nursing note reviewed.  ?Constitutional:   ?   Appearance: Normal appearance. He is well-developed.  ?HENT:  ?   Head: Atraumatic.  ?   Nose: Nose normal.  ?   Mouth/Throat:  ?   Mouth: Mucous membranes are moist.  ?Eyes:  ?   General: No scleral icterus. ?   Conjunctiva/sclera: Conjunctivae normal.  ?Neck:  ?   Trachea: No tracheal deviation.  ?Cardiovascular:  ?   Rate and Rhythm: Normal rate.  ?   Pulses: Normal pulses.  ?   Heart sounds: Normal heart sounds. No murmur heard. ?Pulmonary:  ?   Effort: Pulmonary effort is normal. No accessory muscle usage or respiratory distress.  ?Genitourinary: ?   Comments: No cva tenderness. ?Musculoskeletal:     ?   General: No swelling.  ?   Cervical back: Neck supple.  ?   Comments: Swelling, erythema, and mild tenderness to left olecranon bursa area/posterior elbow. No crepitus to area. There is not pain w passive rom elbow joint/no septic arthritis. Radial pulse 2+. Skin intact.   ?Skin: ?   General: Skin is warm and dry.  ?   Findings: No  rash.  ?Neurological:  ?   Mental Status: He is alert.  ?   Comments: Alert, speech clear. LUE nvi.   ?Psychiatric:     ?   Mood and Affect: Mood normal.  ? ? ?ED Results / Procedures / Treatments   ?Labs ?(all labs ordered are listed, but only abnormal results are displayed) ?Labs Reviewed - No data to display ? ?EKG ?None ? ?Radiology ?No results found. ? ?Procedures ?Procedures  ? ? ?Medications Ordered in ED ?Medications  ?amoxicillin-clavulanate (AUGMENTIN) 875-125 MG per tablet 1 tablet (  has no administration in time range)  ?ibuprofen (ADVIL) tablet 400 mg (has no administration in time range)  ? ? ?ED Course/ Medical Decision Making/ A&P ?  ?                        ?Medical Decision Making ?Risk ?Prescription drug management. ? ? ?Exam c/w olecranon bursitis, ?infective, ?cellulitis.  ? ?Reviewed nursing notes and prior charts for additional history. Additional hx from sig other. ? ?Confirmed no abx allergies.  ? ?Augmentin po. Ibuprofen po. ? ?Pt appears stable for d/c. ? ?Rec pcp/ortho f/u. ? ?Return precautions provided. ? ? ? ? ? ? ? ? ? ?Final Clinical Impression(s) / ED Diagnoses ?Final diagnoses:  ?None  ? ? ?Rx / DC Orders ?ED Discharge Orders   ? ? None  ? ?  ? ? ?  ?Lajean Saver, MD ?12/19/21 1508 ? ?

## 2021-12-18 NOTE — ED Triage Notes (Signed)
Patient has had cellulitis in left elbow 2 times in past 6 months and was treated. His left elbow has been swollen for 3 weeks but was soft, now today is hard and red.  ?

## 2021-12-18 NOTE — ED Notes (Signed)
Pt observed ambulating in hunched position from hall bathroom to room -- gait otherwise steady; no obvious distress noted.    ?

## 2021-12-18 NOTE — ED Notes (Signed)
Pt agreeable with d/c plan as discussed by provider- this nurse has verbally reinforced d/c instructions and provided pt with written copy -pt acknowledges verbal understanding and denies any additional questions concerns needs- ambulatory at discharge with spouse - no distress- vitals stable.  ?

## 2021-12-18 NOTE — ED Notes (Signed)
Pt ambulated to the bathroom.  

## 2021-12-18 NOTE — ED Notes (Signed)
Late entry -- Initial 400mg  Ibuprofen pulled for administration was dropped on the floor by patient- cabinet override for 2nd motrin to replace the original tab that had to be wasted  ?

## 2021-12-23 DIAGNOSIS — M25522 Pain in left elbow: Secondary | ICD-10-CM | POA: Insufficient documentation

## 2021-12-23 DIAGNOSIS — M7022 Olecranon bursitis, left elbow: Secondary | ICD-10-CM | POA: Insufficient documentation

## 2021-12-23 DIAGNOSIS — M7030 Other bursitis of elbow, unspecified elbow: Secondary | ICD-10-CM | POA: Insufficient documentation

## 2021-12-30 ENCOUNTER — Other Ambulatory Visit: Payer: Self-pay | Admitting: Internal Medicine

## 2022-01-21 ENCOUNTER — Ambulatory Visit: Payer: No Typology Code available for payment source | Admitting: Internal Medicine

## 2022-01-21 ENCOUNTER — Encounter: Payer: Self-pay | Admitting: Internal Medicine

## 2022-01-21 VITALS — BP 128/70 | HR 67 | Temp 98.0°F | Ht 69.0 in | Wt 240.8 lb

## 2022-01-21 DIAGNOSIS — IMO0002 Reserved for concepts with insufficient information to code with codable children: Secondary | ICD-10-CM

## 2022-01-21 DIAGNOSIS — E1165 Type 2 diabetes mellitus with hyperglycemia: Secondary | ICD-10-CM

## 2022-01-21 DIAGNOSIS — E66812 Obesity, class 2: Secondary | ICD-10-CM

## 2022-01-21 DIAGNOSIS — E538 Deficiency of other specified B group vitamins: Secondary | ICD-10-CM

## 2022-01-21 DIAGNOSIS — L0390 Cellulitis, unspecified: Secondary | ICD-10-CM

## 2022-01-21 DIAGNOSIS — M545 Low back pain, unspecified: Secondary | ICD-10-CM | POA: Diagnosis not present

## 2022-01-21 DIAGNOSIS — R609 Edema, unspecified: Secondary | ICD-10-CM

## 2022-01-21 DIAGNOSIS — M79605 Pain in left leg: Secondary | ICD-10-CM

## 2022-01-21 DIAGNOSIS — Z0000 Encounter for general adult medical examination without abnormal findings: Secondary | ICD-10-CM

## 2022-01-21 DIAGNOSIS — K029 Dental caries, unspecified: Secondary | ICD-10-CM | POA: Diagnosis not present

## 2022-01-21 DIAGNOSIS — Z6837 Body mass index (BMI) 37.0-37.9, adult: Secondary | ICD-10-CM

## 2022-01-21 DIAGNOSIS — M79604 Pain in right leg: Secondary | ICD-10-CM

## 2022-01-21 DIAGNOSIS — F17200 Nicotine dependence, unspecified, uncomplicated: Secondary | ICD-10-CM

## 2022-01-21 LAB — CBC WITH DIFFERENTIAL/PLATELET
Basophils Absolute: 0 10*3/uL (ref 0.0–0.1)
Basophils Relative: 0.4 % (ref 0.0–3.0)
Eosinophils Absolute: 0.4 10*3/uL (ref 0.0–0.7)
Eosinophils Relative: 3.9 % (ref 0.0–5.0)
HCT: 43.2 % (ref 39.0–52.0)
Hemoglobin: 14.4 g/dL (ref 13.0–17.0)
Lymphocytes Relative: 22.9 % (ref 12.0–46.0)
Lymphs Abs: 2.5 10*3/uL (ref 0.7–4.0)
MCHC: 33.4 g/dL (ref 30.0–36.0)
MCV: 88.8 fl (ref 78.0–100.0)
Monocytes Absolute: 0.9 10*3/uL (ref 0.1–1.0)
Monocytes Relative: 8.2 % (ref 3.0–12.0)
Neutro Abs: 7.1 10*3/uL (ref 1.4–7.7)
Neutrophils Relative %: 64.6 % (ref 43.0–77.0)
Platelets: 186 10*3/uL (ref 150.0–400.0)
RBC: 4.87 Mil/uL (ref 4.22–5.81)
RDW: 14.1 % (ref 11.5–15.5)
WBC: 11 10*3/uL — ABNORMAL HIGH (ref 4.0–10.5)

## 2022-01-21 LAB — VITAMIN B12: Vitamin B-12: 309 pg/mL (ref 211–911)

## 2022-01-21 LAB — COMPREHENSIVE METABOLIC PANEL
ALT: 25 U/L (ref 0–53)
AST: 22 U/L (ref 0–37)
Albumin: 4 g/dL (ref 3.5–5.2)
Alkaline Phosphatase: 66 U/L (ref 39–117)
BUN: 16 mg/dL (ref 6–23)
CO2: 31 mEq/L (ref 19–32)
Calcium: 9.7 mg/dL (ref 8.4–10.5)
Chloride: 94 mEq/L — ABNORMAL LOW (ref 96–112)
Creatinine, Ser: 0.8 mg/dL (ref 0.40–1.50)
GFR: 98.23 mL/min (ref >60.00)
Glucose, Bld: 179 mg/dL — ABNORMAL HIGH (ref 70–99)
Potassium: 4.2 mEq/L (ref 3.5–5.1)
Sodium: 129 mEq/L — ABNORMAL LOW (ref 135–145)
Total Bilirubin: 0.2 mg/dL (ref 0.2–1.2)
Total Protein: 7.6 g/dL (ref 6.0–8.3)

## 2022-01-21 LAB — VITAMIN D 25 HYDROXY (VIT D DEFICIENCY, FRACTURES): VITD: 29.48 ng/mL — ABNORMAL LOW (ref 30.00–100.00)

## 2022-01-21 LAB — TSH: TSH: 3.88 u[IU]/mL (ref 0.35–5.50)

## 2022-01-21 LAB — HEMOGLOBIN A1C: Hgb A1c MFr Bld: 8.5 % — ABNORMAL HIGH (ref 4.6–6.5)

## 2022-01-21 MED ORDER — MORPHINE SULFATE 15 MG PO TABS: 15.0000 mg | ORAL_TABLET | Freq: Two times a day (BID) | ORAL | 0 refills | Status: DC | PRN

## 2022-01-21 MED ORDER — MORPHINE SULFATE ER 100 MG PO TBCR: 200.0000 mg | EXTENDED_RELEASE_TABLET | Freq: Three times a day (TID) | ORAL | 0 refills | Status: DC

## 2022-01-21 NOTE — Progress Notes (Signed)
Subjective:  Patient ID: [name] [name], male    DOB: 10-Jun-1966  Age: 56 y.o. MRN: [id number]  CC: Follow-up   HPI [name] [name] presents for L elbow cellulitis/bursitis F/u on LBP, DM, HTN  Outpatient Medications Prior to Visit  Medication Sig Dispense Refill   acarbose (PRECOSE) 25 MG tablet take ONE tablet by MOUTH THREE times daily with meals 90 tablet 1   amLODipine-valsartan (EXFORGE) 10-320 MG tablet TAKE ONE TABLET EVERY DAY 90 tablet 2   ANORO ELLIPTA 62.5-25 MCG/ACT AEPB INHALE ONE PUFF BY MOUTH DAILY 60 each 5   aspirin EC 81 MG tablet Take 81 mg by mouth daily.     B-D ULTRAFINE III SHORT PEN 31G X 8 MM MISC USE AS DIRECTED THREE TIMES DAILY 100 each 0   Cholecalciferol (VITAMIN D3) 50 MCG (2000 UT) capsule Take 1 capsule (2,000 Units total) by mouth daily. 100 capsule 3   cyanocobalamin (,VITAMIN B-12,) 1000 MCG/ML injection Give 1000 mcg of Vitamin b12 sq daily x 1 week, then weekly for 1 month, then once every 2 weeks 10 mL 6   empagliflozin (JARDIANCE) 10 MG TABS tablet Take 1 tablet (10 mg total) by mouth daily before breakfast. 90 tablet 3   fluticasone (FLONASE) 50 MCG/ACT nasal spray SPRAY 2 SPRAYS INTO EACH NOSTRIL EVERY DAY 48 mL 1   furosemide (LASIX) 20 MG tablet Take 1 to 2 tablets (20-40 mg total) by mouth daily as needed. 60 tablet 3   insulin glargine, 2 Unit Dial, (TOUJEO MAX SOLOSTAR) 300 UNIT/ML Solostar Pen Inject 68 Units into the skin every morning. And pen needles 1/day. 72 mL 3   Lancets (ONETOUCH DELICA PLUS 123XX123) MISC USE TO check blood sugar TWICE DAILY 100 each 3   lovastatin (MEVACOR) 20 MG tablet Take 1 tablet (20 mg total) by mouth daily. 90 tablet 3   metFORMIN (GLUCOPHAGE-XR) 500 MG 24 hr tablet TAKE ONE TABLET BY MOUTH EVERY MORNING with BREAKFAST 90 tablet 2   morphine (MS CONTIN) 100 MG 12 hr tablet Take 2 tablets (200 mg total) by mouth 3 (three) times daily. 180 tablet 0   morphine (MS CONTIN) 100 MG 12 hr tablet Take 2  tablets (200 mg total) by mouth 3 (three) times daily. 180 tablet 0   morphine (MS CONTIN) 100 MG 12 hr tablet Take 2 tablets (200 mg total) by mouth 3 (three) times daily. 180 tablet 0   morphine (MSIR) 15 MG tablet Take 1 tablet (15 mg total) by mouth 2 (two) times daily as needed for severe pain. 60 tablet 0   morphine (MSIR) 15 MG tablet Take 1 tablet (15 mg total) by mouth 2 (two) times daily as needed for severe pain. 60 tablet 0   morphine (MSIR) 15 MG tablet Take 1 tablet (15 mg total) by mouth 2 (two) times daily as needed for severe pain. 60 tablet 0   mupirocin ointment (BACTROBAN) 2 % On leg wound w/dressing change bid 30 g 0   ONETOUCH ULTRA test strip USE TO TEST BLOOD SUGAR TWICE DAILY 100 strip 0   Semaglutide (RYBELSUS) 3 MG TABS Take 3 mg by mouth every morning. 90 tablet 3   SYRINGE-NEEDLE, DISP, 3 ML (BD ECLIPSE SYRINGE) 25G X 1" 3 ML MISC Use for vitamin B12 shots as directed 50 each 3   Vitamin D, Ergocalciferol, (DRISDOL) 1.25 MG (50000 UNIT) CAPS capsule Take 1 capsule (50,000 Units total) by mouth every 7 (seven) days. 8 capsule 0  amoxicillin-clavulanate (AUGMENTIN) 875-125 MG tablet Take 1 tablet by mouth 2 (two) times daily. (Patient not taking: Reported on 01/21/2022) 14 tablet 0   doxycycline (VIBRA-TABS) 100 MG tablet Take 1 tablet (100 mg total) by mouth 2 (two) times daily. (Patient not taking: Reported on 01/21/2022) 28 tablet 0   No facility-administered medications prior to visit.    ROS: Review of Systems  Constitutional:  Negative for appetite change, fatigue and unexpected weight change.  HENT:  Negative for congestion, nosebleeds, sneezing, sore throat and trouble swallowing.   Eyes:  Negative for itching and visual disturbance.  Respiratory:  Negative for cough.   Cardiovascular:  Negative for chest pain, palpitations and leg swelling.  Gastrointestinal:  Negative for abdominal distention, blood in stool, diarrhea and nausea.  Genitourinary:  Negative  for frequency and hematuria.  Musculoskeletal:  Positive for arthralgias and back pain. Negative for gait problem, joint swelling and neck pain.  Skin:  Positive for color change. Negative for rash and wound.  Neurological:  Negative for dizziness, tremors, speech difficulty and weakness.  Psychiatric/Behavioral:  Negative for agitation, dysphoric mood and sleep disturbance. The patient is not nervous/anxious.     Objective:  BP 128/70 (BP Location: Left Arm, Patient Position: Sitting, Cuff Size: Normal)   Pulse 67   Temp 98 F (36.7 C) (Oral)   Ht 5\' 9"  (1.753 m)   Wt 240 lb 12.8 oz (109.2 kg)   SpO2 98%   BMI 35.56 kg/m   BP Readings from Last 3 Encounters:  01/21/22 128/70  12/18/21 123/72  11/13/21 140/70    Wt Readings from Last 3 Encounters:  01/21/22 240 lb 12.8 oz (109.2 kg)  11/13/21 242 lb (109.8 kg)  10/30/21 237 lb 12.8 oz (107.9 kg)    Physical Exam Constitutional:      General: He is not in acute distress.    Appearance: He is well-developed. He is obese.     Comments: NAD  Eyes:     Conjunctiva/sclera: Conjunctivae normal.     Pupils: Pupils are equal, round, and reactive to light.  Neck:     Thyroid: No thyromegaly.     Vascular: No JVD.  Cardiovascular:     Rate and Rhythm: Normal rate and regular rhythm.     Heart sounds: Normal heart sounds. No murmur heard.   No friction rub. No gallop.  Pulmonary:     Effort: Pulmonary effort is normal. No respiratory distress.     Breath sounds: Normal breath sounds. No wheezing or rales.  Chest:     Chest wall: No tenderness.  Abdominal:     General: Bowel sounds are normal. There is no distension.     Palpations: Abdomen is soft. There is no mass.     Tenderness: There is no abdominal tenderness. There is no guarding or rebound.  Musculoskeletal:        General: Tenderness present. Normal range of motion.     Cervical back: Normal range of motion.     Right lower leg: Edema present.     Left lower leg:  Edema present.  Lymphadenopathy:     Cervical: No cervical adenopathy.  Skin:    General: Skin is warm and dry.     Findings: Erythema present. No rash.  Neurological:     Mental Status: He is alert and oriented to person, place, and time.     Cranial Nerves: No cranial nerve deficit.     Motor: No abnormal muscle tone.  Coordination: Coordination normal.     Gait: Gait abnormal.     Deep Tendon Reflexes: Reflexes are normal and symmetric.  Psychiatric:        Behavior: Behavior normal.        Thought Content: Thought content normal.        Judgment: Judgment normal.  L elbow is large on the posterior Lower teeth w/caries   Lab Results  Component Value Date   WBC 8.9 05/02/2021   HGB 15.1 05/02/2021   HCT 45.0 05/02/2021   PLT 261.0 05/02/2021   GLUCOSE 173 (H) 05/02/2021   CHOL 129 05/02/2021   TRIG 110.0 05/02/2021   HDL 44.30 05/02/2021   LDLDIRECT 116.6 05/20/2007   LDLCALC 63 05/02/2021   ALT 13 05/02/2021   AST 15 05/02/2021   NA 133 (L) 05/02/2021   K 4.0 05/02/2021   CL 95 (L) 05/02/2021   CREATININE 0.82 05/02/2021   BUN 16 05/02/2021   CO2 30 05/02/2021   TSH 4.18 05/02/2021   PSA 0.20 04/23/2018   INR 1.1 05/10/2008   HGBA1C 7.8 (A) 07/16/2021   MICROALBUR <0.7 05/02/2021    No results found.  Assessment & Plan:   Problem List Items Addressed This Visit     Obesity    On Rybelsus Wt Readings from Last 3 Encounters:  01/21/22 240 lb 12.8 oz (109.2 kg)  11/13/21 242 lb (109.8 kg)  10/30/21 237 lb 12.8 oz (107.9 kg)        TOBACCO USE DISORDER/SMOKER-SMOKING CESSATION DISCUSSED    Refractory       Low back pain radiating to both legs    Continue with MSIR, MSContin   Potential benefits of a long term opioids  use as well as potential risks  and complications were explained to the patient and were aknowledged. He is maintaining stooped over posture when standing/walking      Edema    R>L Cont on Furosemide 40 mg/d      Caries  involving multiple surfaces of tooth    Discussed - needs to see his dentist       Cellulitis    F/u w/Ortho Finished Doxy       B12 deficiency    Cont on B12          No orders of the defined types were placed in this encounter.     Follow-up: No follow-ups on file.  Walker Kehr, MD

## 2022-01-21 NOTE — Assessment & Plan Note (Signed)
On Rybelsus Wt Readings from Last 3 Encounters:  01/21/22 240 lb 12.8 oz (109.2 kg)  11/13/21 242 lb (109.8 kg)  10/30/21 237 lb 12.8 oz (107.9 kg)

## 2022-01-21 NOTE — Assessment & Plan Note (Signed)
R>L Cont on Furosemide 40 mg/d

## 2022-01-21 NOTE — Assessment & Plan Note (Addendum)
Discussed - needs to see his dentist

## 2022-01-21 NOTE — Assessment & Plan Note (Signed)
Refractory  

## 2022-01-21 NOTE — Assessment & Plan Note (Signed)
F/u w/Ortho Bary Castilla Doxy

## 2022-01-21 NOTE — Assessment & Plan Note (Signed)
Continue with MSIR, MSContin   Potential benefits of a long term opioids  use as well as potential risks  and complications were explained to the patient and were aknowledged. He is maintaining stooped over posture when standing/walking 

## 2022-01-21 NOTE — Assessment & Plan Note (Signed)
Cont on B12 ?

## 2022-01-22 LAB — URINALYSIS, ROUTINE W REFLEX MICROSCOPIC
Bilirubin Urine: NEGATIVE
Ketones, ur: NEGATIVE
Nitrite: NEGATIVE
Specific Gravity, Urine: 1.01 (ref 1.000–1.030)
Total Protein, Urine: NEGATIVE
Urine Glucose: >=1000 — AB
Urobilinogen, UA: 0.2 (ref 0.0–1.0)
pH: 6 (ref 5.0–8.0)

## 2022-01-23 ENCOUNTER — Other Ambulatory Visit: Payer: Self-pay | Admitting: Internal Medicine

## 2022-01-23 MED ORDER — LEVOFLOXACIN 500 MG PO TABS: 500.0000 mg | ORAL_TABLET | Freq: Every day | ORAL | 0 refills | Status: DC

## 2022-01-28 ENCOUNTER — Encounter: Payer: Self-pay | Admitting: Internal Medicine

## 2022-02-04 ENCOUNTER — Other Ambulatory Visit: Payer: Self-pay | Admitting: Internal Medicine

## 2022-02-06 ENCOUNTER — Other Ambulatory Visit: Payer: Self-pay

## 2022-02-06 DIAGNOSIS — E1165 Type 2 diabetes mellitus with hyperglycemia: Secondary | ICD-10-CM

## 2022-02-06 MED ORDER — TOUJEO MAX SOLOSTAR 300 UNIT/ML ~~LOC~~ SOPN: 68.0000 [IU] | PEN_INJECTOR | SUBCUTANEOUS | 3 refills | Status: DC

## 2022-02-07 ENCOUNTER — Other Ambulatory Visit: Payer: Self-pay | Admitting: Internal Medicine

## 2022-02-07 DIAGNOSIS — E1165 Type 2 diabetes mellitus with hyperglycemia: Secondary | ICD-10-CM

## 2022-02-24 ENCOUNTER — Other Ambulatory Visit: Payer: Self-pay | Admitting: Internal Medicine

## 2022-03-11 ENCOUNTER — Telehealth: Payer: Self-pay

## 2022-03-11 NOTE — Telephone Encounter (Signed)
Attempted to contact the patient in regards to rescheduling with another provider, LVM for a call back. 

## 2022-03-29 ENCOUNTER — Other Ambulatory Visit: Payer: Self-pay | Admitting: Internal Medicine

## 2022-03-29 DIAGNOSIS — E1165 Type 2 diabetes mellitus with hyperglycemia: Secondary | ICD-10-CM

## 2022-04-21 ENCOUNTER — Other Ambulatory Visit: Payer: Self-pay | Admitting: Internal Medicine

## 2022-04-23 ENCOUNTER — Encounter: Payer: Self-pay | Admitting: Internal Medicine

## 2022-04-23 ENCOUNTER — Ambulatory Visit: Payer: No Typology Code available for payment source | Admitting: Internal Medicine

## 2022-04-23 DIAGNOSIS — M79605 Pain in left leg: Secondary | ICD-10-CM

## 2022-04-23 DIAGNOSIS — M545 Low back pain, unspecified: Secondary | ICD-10-CM

## 2022-04-23 DIAGNOSIS — I10 Essential (primary) hypertension: Secondary | ICD-10-CM | POA: Diagnosis not present

## 2022-04-23 DIAGNOSIS — Z6837 Body mass index (BMI) 37.0-37.9, adult: Secondary | ICD-10-CM

## 2022-04-23 DIAGNOSIS — I2510 Atherosclerotic heart disease of native coronary artery without angina pectoris: Secondary | ICD-10-CM | POA: Diagnosis not present

## 2022-04-23 DIAGNOSIS — I2583 Coronary atherosclerosis due to lipid rich plaque: Secondary | ICD-10-CM

## 2022-04-23 DIAGNOSIS — M79604 Pain in right leg: Secondary | ICD-10-CM

## 2022-04-23 MED ORDER — MORPHINE SULFATE ER 100 MG PO TBCR: 200.0000 mg | EXTENDED_RELEASE_TABLET | Freq: Three times a day (TID) | ORAL | 0 refills | Status: DC

## 2022-04-23 MED ORDER — MORPHINE SULFATE 15 MG PO TABS: 15.0000 mg | ORAL_TABLET | Freq: Two times a day (BID) | ORAL | 0 refills | Status: DC | PRN

## 2022-04-23 MED ORDER — NALOXONE HCL 4 MG/0.1ML NA LIQD: NASAL | 2 refills | Status: AC

## 2022-04-23 MED ORDER — ACARBOSE 25 MG PO TABS: 25.0000 mg | ORAL_TABLET | Freq: Three times a day (TID) | ORAL | 3 refills | Status: DC

## 2022-04-23 NOTE — Assessment & Plan Note (Signed)
Cont w/Amlodipine-Valsartan NAS diet 

## 2022-04-23 NOTE — Assessment & Plan Note (Signed)
Wt Readings from Last 3 Encounters:  04/23/22 237 lb 12.8 oz (107.9 kg)  01/21/22 240 lb 12.8 oz (109.2 kg)  11/13/21 242 lb (109.8 kg)

## 2022-04-23 NOTE — Assessment & Plan Note (Signed)
CT coronary calcium score is 245

## 2022-04-23 NOTE — Assessment & Plan Note (Signed)
Chronic severe LBP, on opioids Continue with MSIR, MSContin   Potential benefits of a long term opioids  use as well as potential risks  and complications were explained to the patient and were aknowledged. He is maintaining stooped over posture when standing/walking

## 2022-04-23 NOTE — Progress Notes (Signed)
Subjective:  Patient ID: [name] [name], male    DOB: 08-13-1966  Age: 56 y.o. MRN: [id number]  CC: Follow-up (3 month f/u)   HPI [name] [name] presents for LBP, DM, CAD  Outpatient Medications Prior to Visit  Medication Sig Dispense Refill   amLODipine-valsartan (EXFORGE) 10-320 MG tablet TAKE ONE TABLET EVERY DAY 90 tablet 2   ANORO ELLIPTA 62.5-25 MCG/ACT AEPB INHALE ONE PUFF BY MOUTH DAILY 60 each 5   aspirin EC 81 MG tablet Take 81 mg by mouth daily.     B-D ULTRAFINE III SHORT PEN 31G X 8 MM MISC USE AS DIRECTED THREE TIMES DAILY 100 each 0   Cholecalciferol (VITAMIN D3) 50 MCG (2000 UT) capsule Take 1 capsule (2,000 Units total) by mouth daily. 100 capsule 3   cyanocobalamin (,VITAMIN B-12,) 1000 MCG/ML injection Give 1000 mcg of Vitamin b12 sq daily x 1 week, then weekly for 1 month, then once every 2 weeks 10 mL 6   fluticasone (FLONASE) 50 MCG/ACT nasal spray SPRAY 2 SPRAYS INTO EACH NOSTRIL EVERY DAY 48 mL 1   furosemide (LASIX) 20 MG tablet TAKE 1 OR 2 TABLETS BY MOUTH DAILY AS NEEDED 60 tablet 3   insulin glargine, 2 Unit Dial, (TOUJEO MAX SOLOSTAR) 300 UNIT/ML Solostar Pen Inject 68 Units into the skin every morning. And pen needles 1/day. 72 mL 3   JARDIANCE 10 MG TABS tablet Take 1 tablet (10 mg total) by mouth daily before breakfast. 90 tablet 0   Lancets (ONETOUCH DELICA PLUS 123XX123) MISC USE TO check blood sugar TWICE DAILY 100 each 3   lovastatin (MEVACOR) 20 MG tablet Take 1 tablet (20 mg total) by mouth daily. 90 tablet 3   metFORMIN (GLUCOPHAGE-XR) 500 MG 24 hr tablet TAKE ONE TABLET BY MOUTH EVERY MORNING with BREAKFAST 90 tablet 2   mupirocin ointment (BACTROBAN) 2 % On leg wound w/dressing change bid 30 g 0   ONETOUCH ULTRA test strip USE TO TEST BLOOD SUGAR TWICE DAILY 100 strip 0   RYBELSUS 3 MG TABS TAKE ONE TABLET BY MOUTH EVERY MORNING 90 tablet 0   SYRINGE-NEEDLE, DISP, 3 ML (BD ECLIPSE SYRINGE) 25G X 1" 3 ML MISC Use for vitamin B12 shots as  directed 50 each 3   Vitamin D, Ergocalciferol, (DRISDOL) 1.25 MG (50000 UNIT) CAPS capsule Take 1 capsule (50,000 Units total) by mouth every 7 (seven) days. 8 capsule 0   acarbose (PRECOSE) 25 MG tablet take ONE tablet by MOUTH THREE times daily with meals 90 tablet 0   levofloxacin (LEVAQUIN) 500 MG tablet Take 1 tablet (500 mg total) by mouth daily. 14 tablet 0   morphine (MS CONTIN) 100 MG 12 hr tablet Take 2 tablets (200 mg total) by mouth 3 (three) times daily. 180 tablet 0   morphine (MS CONTIN) 100 MG 12 hr tablet Take 2 tablets (200 mg total) by mouth 3 (three) times daily. 180 tablet 0   morphine (MS CONTIN) 100 MG 12 hr tablet Take 2 tablets (200 mg total) by mouth 3 (three) times daily. 180 tablet 0   morphine (MSIR) 15 MG tablet Take 1 tablet (15 mg total) by mouth 2 (two) times daily as needed for severe pain. 60 tablet 0   morphine (MSIR) 15 MG tablet Take 1 tablet (15 mg total) by mouth 2 (two) times daily as needed for severe pain. 60 tablet 0   morphine (MSIR) 15 MG tablet Take 1 tablet (15 mg total) by mouth  2 (two) times daily as needed for severe pain. 60 tablet 0   No facility-administered medications prior to visit.    ROS: Review of Systems  Constitutional:  Positive for fatigue. Negative for appetite change and unexpected weight change.  HENT:  Negative for congestion, nosebleeds, sneezing, sore throat and trouble swallowing.   Eyes:  Negative for itching and visual disturbance.  Respiratory:  Negative for cough.   Cardiovascular:  Negative for chest pain, palpitations and leg swelling.  Gastrointestinal:  Negative for abdominal distention, blood in stool, diarrhea and nausea.  Genitourinary:  Negative for frequency and hematuria.  Musculoskeletal:  Positive for arthralgias and back pain. Negative for gait problem, joint swelling and neck pain.  Skin:  Negative for rash.  Neurological:  Negative for dizziness, tremors, speech difficulty and weakness.   Psychiatric/Behavioral:  Negative for agitation, dysphoric mood and sleep disturbance. The patient is not nervous/anxious.     Objective:  BP (!) 142/70 (BP Location: Left Arm)   Pulse 69   Temp 97.8 F (36.6 C) (Oral)   Ht 5\' 9"  (1.753 m)   Wt 237 lb 12.8 oz (107.9 kg)   SpO2 95%   BMI 35.12 kg/m   BP Readings from Last 3 Encounters:  04/23/22 (!) 142/70  01/21/22 128/70  12/18/21 123/72    Wt Readings from Last 3 Encounters:  04/23/22 237 lb 12.8 oz (107.9 kg)  01/21/22 240 lb 12.8 oz (109.2 kg)  11/13/21 242 lb (109.8 kg)    Physical Exam Constitutional:      General: He is not in acute distress.    Appearance: He is well-developed. He is obese.     Comments: NAD  Eyes:     Conjunctiva/sclera: Conjunctivae normal.     Pupils: Pupils are equal, round, and reactive to light.  Neck:     Thyroid: No thyromegaly.     Vascular: No JVD.  Cardiovascular:     Rate and Rhythm: Normal rate and regular rhythm.     Heart sounds: Normal heart sounds. No murmur heard.    No friction rub. No gallop.  Pulmonary:     Effort: Pulmonary effort is normal. No respiratory distress.     Breath sounds: Normal breath sounds. No wheezing or rales.  Chest:     Chest wall: No tenderness.  Abdominal:     General: Bowel sounds are normal. There is no distension.     Palpations: Abdomen is soft. There is no mass.     Tenderness: There is no abdominal tenderness. There is no guarding or rebound.  Musculoskeletal:        General: Tenderness present. Normal range of motion.     Cervical back: Normal range of motion.  Lymphadenopathy:     Cervical: No cervical adenopathy.  Skin:    General: Skin is warm and dry.     Findings: No rash.  Neurological:     Mental Status: He is alert and oriented to person, place, and time.     Cranial Nerves: No cranial nerve deficit.     Motor: No abnormal muscle tone.     Coordination: Coordination normal.     Gait: Gait normal.     Deep Tendon  Reflexes: Reflexes are normal and symmetric.  Psychiatric:        Behavior: Behavior normal.        Thought Content: Thought content normal.        Judgment: Judgment normal.   LS w/pain  Lab Results  Component Value Date   WBC 11.0 (H) 01/21/2022   HGB 14.4 01/21/2022   HCT 43.2 01/21/2022   PLT 186.0 01/21/2022   GLUCOSE 179 (H) 01/21/2022   CHOL 129 05/02/2021   TRIG 110.0 05/02/2021   HDL 44.30 05/02/2021   LDLDIRECT 116.6 05/20/2007   LDLCALC 63 05/02/2021   ALT 25 01/21/2022   AST 22 01/21/2022   NA 129 (L) 01/21/2022   K 4.2 01/21/2022   CL 94 (L) 01/21/2022   CREATININE 0.80 01/21/2022   BUN 16 01/21/2022   CO2 31 01/21/2022   TSH 3.88 01/21/2022   PSA 0.20 04/23/2018   INR 1.1 05/10/2008   HGBA1C 8.5 (H) 01/21/2022   MICROALBUR <0.7 05/02/2021    No results found.  Assessment & Plan:   Problem List Items Addressed This Visit     CAD (coronary artery disease)     CT coronary calcium score is 245      Essential hypertension    Cont w/Amlodipine-Valsartan NAS diet      Low back pain radiating to both legs    Chronic severe LBP, on opioids Continue with MSIR, MSContin   Potential benefits of a long term opioids  use as well as potential risks  and complications were explained to the patient and were aknowledged. He is maintaining stooped over posture when standing/walking      Relevant Medications   morphine (MS CONTIN) 100 MG 12 hr tablet   morphine (MS CONTIN) 100 MG 12 hr tablet   morphine (MS CONTIN) 100 MG 12 hr tablet   morphine (MSIR) 15 MG tablet   morphine (MSIR) 15 MG tablet   morphine (MSIR) 15 MG tablet   Obesity    Wt Readings from Last 3 Encounters:  04/23/22 237 lb 12.8 oz (107.9 kg)  01/21/22 240 lb 12.8 oz (109.2 kg)  11/13/21 242 lb (109.8 kg)        Relevant Medications   acarbose (PRECOSE) 25 MG tablet      Meds ordered this encounter  Medications   acarbose (PRECOSE) 25 MG tablet    Sig: Take 1 tablet (25 mg  total) by mouth 3 (three) times daily with meals.    Dispense:  90 tablet    Refill:  3    Contact office for follow up appointment before further refills   morphine (MS CONTIN) 100 MG 12 hr tablet    Sig: Take 2 tablets (200 mg total) by mouth 3 (three) times daily.    Dispense:  180 tablet    Refill:  0    Fill on or after 06/28/22   morphine (MS CONTIN) 100 MG 12 hr tablet    Sig: Take 2 tablets (200 mg total) by mouth 3 (three) times daily.    Dispense:  180 tablet    Refill:  0    Fill on or after 05/29/22   morphine (MS CONTIN) 100 MG 12 hr tablet    Sig: Take 2 tablets (200 mg total) by mouth 3 (three) times daily.    Dispense:  180 tablet    Refill:  0    Fill on or after 04/29/22   morphine (MSIR) 15 MG tablet    Sig: Take 1 tablet (15 mg total) by mouth 2 (two) times daily as needed for severe pain.    Dispense:  60 tablet    Refill:  0    Please fill on or after 06/28/22   morphine (MSIR) 15 MG tablet  Sig: Take 1 tablet (15 mg total) by mouth 2 (two) times daily as needed for severe pain.    Dispense:  60 tablet    Refill:  0    Please fill on or after 05/29/22   morphine (MSIR) 15 MG tablet    Sig: Take 1 tablet (15 mg total) by mouth 2 (two) times daily as needed for severe pain.    Dispense:  60 tablet    Refill:  0    Please fill on or after 04/29/22      Follow-up: Return in about 3 months (around 07/24/2022) for a follow-up visit.  Walker Kehr, MD

## 2022-05-15 ENCOUNTER — Other Ambulatory Visit: Payer: Self-pay | Admitting: *Deleted

## 2022-05-15 MED ORDER — LOVASTATIN 20 MG PO TABS: 20.0000 mg | ORAL_TABLET | Freq: Every day | ORAL | 3 refills | Status: DC

## 2022-05-20 ENCOUNTER — Other Ambulatory Visit: Payer: Self-pay | Admitting: Internal Medicine

## 2022-05-20 DIAGNOSIS — E1165 Type 2 diabetes mellitus with hyperglycemia: Secondary | ICD-10-CM

## 2022-05-26 ENCOUNTER — Other Ambulatory Visit: Payer: Self-pay | Admitting: Internal Medicine

## 2022-06-23 ENCOUNTER — Encounter: Payer: Self-pay | Admitting: Internal Medicine

## 2022-06-23 ENCOUNTER — Ambulatory Visit (INDEPENDENT_AMBULATORY_CARE_PROVIDER_SITE_OTHER): Payer: No Typology Code available for payment source | Admitting: Internal Medicine

## 2022-06-23 VITALS — BP 140/78 | HR 83 | Ht 69.0 in | Wt 234.0 lb

## 2022-06-23 DIAGNOSIS — E1165 Type 2 diabetes mellitus with hyperglycemia: Secondary | ICD-10-CM

## 2022-06-23 DIAGNOSIS — S80811A Abrasion, right lower leg, initial encounter: Secondary | ICD-10-CM

## 2022-06-23 DIAGNOSIS — Z794 Long term (current) use of insulin: Secondary | ICD-10-CM

## 2022-06-23 DIAGNOSIS — L089 Local infection of the skin and subcutaneous tissue, unspecified: Secondary | ICD-10-CM | POA: Diagnosis not present

## 2022-06-23 DIAGNOSIS — E1142 Type 2 diabetes mellitus with diabetic polyneuropathy: Secondary | ICD-10-CM

## 2022-06-23 LAB — POCT GLUCOSE (DEVICE FOR HOME USE): POC Glucose: 165 mg/dl — AB (ref 70–99)

## 2022-06-23 LAB — POCT GLYCOSYLATED HEMOGLOBIN (HGB A1C): Hemoglobin A1C: 7.3 % — AB (ref 4.0–5.6)

## 2022-06-23 MED ORDER — METFORMIN HCL ER 500 MG PO TB24: 500.0000 mg | ORAL_TABLET | Freq: Every day | ORAL | 3 refills | Status: DC

## 2022-06-23 MED ORDER — TOUJEO MAX SOLOSTAR 300 UNIT/ML ~~LOC~~ SOPN: 58.0000 [IU] | PEN_INJECTOR | SUBCUTANEOUS | 6 refills | Status: DC

## 2022-06-23 MED ORDER — BD PEN NEEDLE SHORT U/F 31G X 8 MM MISC: 1.0000 | Freq: Every day | 3 refills | Status: DC

## 2022-06-23 MED ORDER — RYBELSUS 7 MG PO TABS: 7.0000 mg | ORAL_TABLET | Freq: Every day | ORAL | 3 refills | Status: DC

## 2022-06-23 MED ORDER — DOXYCYCLINE HYCLATE 100 MG PO TABS: 100.0000 mg | ORAL_TABLET | Freq: Two times a day (BID) | ORAL | 0 refills | Status: DC

## 2022-06-23 MED ORDER — EMPAGLIFLOZIN 25 MG PO TABS: 25.0000 mg | ORAL_TABLET | Freq: Every day | ORAL | 3 refills | Status: DC

## 2022-06-23 NOTE — Progress Notes (Signed)
Name: [name] [name]  Age/ Sex: 56 y.o., male   MRN/ DOB: [id number], 03/28/1966     PCP: Cassandria Anger, MD   Reason for Endocrinology Evaluation: Type 2 Diabetes Mellitus  Initial Endocrine Consultative Visit: 09/17/2012    PATIENT IDENTIFIER: [name] is a 56 y.o. male with a past medical history of HTN, CAD, DM, COPD. The patient has followed with Endocrinology clinic since 09/17/2012 for consultative assistance with management of his diabetes.  DIABETIC HISTORY:  [name] [name] was diagnosed with DM 2005, and started insulin therapy in 2007. His hemoglobin A1c has ranged from 10.6% in 2020, peaking at 7.5% in 2022.  He was followed by Dr. Loanne Drilling from 2014 until March 2023  SUBJECTIVE:   During the last visit (11/13/2021): Saw Dr. Loanne Drilling  Today (06/23/2022): [name] [name] is here for follow-up on diabetes management,  he is accompanied by his spouse . He checks his blood sugars 1-2 times daily. The patient has  had hypoglycemic episodes since the last clinic visit, which typically occur in the evening before Supper , and at bedtime   Snacks    HOME DIABETES REGIMEN:  Acarbose 25 mg TID Jardiance 10 mg daily  Metformin 500 mg XR daily  Rybelsus 3 mg daily  Toujeo 68 units daily       Statin: yes ACE-I/ARB: yes    METER DOWNLOAD SUMMARY:n/a   DIABETIC COMPLICATIONS: Microvascular complications:  Neuropathy Denies: CKD Last Eye Exam: Completed 06/2021  Macrovascular complications:  CAD Denies:  CVA, PVD   HISTORY:  Past Medical History:  Past Medical History:  Diagnosis Date   COPD (chronic obstructive pulmonary disease) ([place])    Diabetes mellitus    GERD (gastroesophageal reflux disease)    Hyperlipidemia    Hypertension    Lower back pain    Nephrolithiasis    Dr Jeffie Pollock   Past Surgical History:  Past Surgical History:  Procedure Laterality Date   KNEE SURGERY     Social History:  reports that he has been smoking cigarettes.  He has been smoking an average of 2 packs per day. He has never used smokeless tobacco. He reports that he does not drink alcohol and does not use drugs. Family History:  Family History  Problem Relation Age of Onset   Hypertension Mother    Diabetes Mother    CAD Mother    Heart disease Other        CAD male relative <60   Vision loss Sister      HOME MEDICATIONS: Allergies as of 06/23/2022       Reactions   Belviq [lorcaserin Hcl]    wierd   Benazepril Hcl    REACTION: cough   Nsaids    Can take short term Upset stomach        Medication List        Accurate as of June 23, 2022 10:57 AM. If you have any questions, ask your nurse or doctor.          acarbose 25 MG tablet Commonly known as: PRECOSE Take 1 tablet (25 mg total) by mouth 3 (three) times daily with meals.   amLODipine-valsartan 10-320 MG tablet Commonly known as: EXFORGE TAKE ONE TABLET EVERY DAY   Anoro Ellipta 62.5-25 MCG/ACT Aepb Generic drug: umeclidinium-vilanterol INHALE ONE PUFF BY MOUTH DAILY   aspirin EC 81 MG tablet Take 81 mg by mouth daily.   B-D ULTRAFINE III SHORT PEN 31G X 8 MM Misc Generic drug:  Insulin Pen Needle USE AS DIRECTED THREE TIMES DAILY   BD Eclipse Syringe 25G X 1" 3 ML Misc Generic drug: SYRINGE-NEEDLE (DISP) 3 ML Use for vitamin B12 shots as directed   cyanocobalamin 1000 MCG/ML injection Commonly known as: VITAMIN B12 Give 1000 mcg of Vitamin b12 sq daily x 1 week, then weekly for 1 month, then once every 2 weeks   fluticasone 50 MCG/ACT nasal spray Commonly known as: FLONASE SPRAY 2 SPRAYS INTO EACH NOSTRIL EVERY DAY   furosemide 20 MG tablet Commonly known as: LASIX TAKE 1 OR 2 TABLETS BY MOUTH DAILY AS NEEDED   Jardiance 10 MG Tabs tablet Generic drug: empagliflozin TAKE ONE TABLET BY MOUTH DAILY BEFORE BREAKFAST   lovastatin 20 MG tablet Commonly known as: MEVACOR Take 1 tablet (20 mg total) by mouth daily.   metFORMIN 500 MG 24 hr  tablet Commonly known as: GLUCOPHAGE-XR TAKE ONE TABLET BY MOUTH EVERY MORNING with BREAKFAST   morphine 100 MG 12 hr tablet Commonly known as: MS Contin Take 2 tablets (200 mg total) by mouth 3 (three) times daily.   morphine 100 MG 12 hr tablet Commonly known as: MS Contin Take 2 tablets (200 mg total) by mouth 3 (three) times daily.   morphine 100 MG 12 hr tablet Commonly known as: MS Contin Take 2 tablets (200 mg total) by mouth 3 (three) times daily.   morphine 15 MG tablet Commonly known as: MSIR Take 1 tablet (15 mg total) by mouth 2 (two) times daily as needed for severe pain.   morphine 15 MG tablet Commonly known as: MSIR Take 1 tablet (15 mg total) by mouth 2 (two) times daily as needed for severe pain.   morphine 15 MG tablet Commonly known as: MSIR Take 1 tablet (15 mg total) by mouth 2 (two) times daily as needed for severe pain.   mupirocin ointment 2 % Commonly known as: BACTROBAN On leg wound w/dressing change bid   naloxone 4 MG/0.1ML Liqd nasal spray kit Commonly known as: NARCAN 1 actuation in one nostril once. May repeat in 2-3 min   OneTouch Delica Plus [id number] Misc USE TO check blood sugar TWICE DAILY   OneTouch Ultra test strip Generic drug: glucose blood USE TO TEST BLOOD SUGAR TWICE DAILY   Rybelsus 3 MG Tabs Generic drug: Semaglutide TAKE ONE TABLET BY MOUTH EVERY MORNING   Toujeo Max SoloStar 300 UNIT/ML Solostar Pen Generic drug: insulin glargine (2 Unit Dial) Inject 68 Units into the skin every morning. And pen needles 1/day.   Vitamin D (Ergocalciferol) 1.25 MG (50000 UNIT) Caps capsule Commonly known as: DRISDOL Take 1 capsule (50,000 Units total) by mouth every 7 (seven) days.   Vitamin D3 50 MCG (2000 UT) capsule Take 1 capsule (2,000 Units total) by mouth daily.         OBJECTIVE:   Vital Signs: BP (!) 140/78 (BP Location: Left Arm, Patient Position: Sitting, Cuff Size: Large)   Pulse 83   Ht $R'5\' 9"'WG$  (1.753 m)   Wt  234 lb (106.1 kg)   SpO2 95%   BMI 34.56 kg/m   Wt Readings from Last 3 Encounters:  06/23/22 234 lb (106.1 kg)  04/23/22 237 lb 12.8 oz (107.9 kg)  01/21/22 240 lb 12.8 oz (109.2 kg)     Exam: General: Pt appears well and is in NAD  Neck: General: Supple without adenopathy. Thyroid: Thyroid size normal.  No goiter or nodules appreciated.   Lungs: Clear with good BS bilat  Heart: RRR   Abdomen:  soft, nontender  Extremities: No pretibial edema.   Skin: Right calf erythema with sloughing of the skin   Neuro: MS is good with appropriate affect, pt is alert and Ox3    DM foot exam: 06/23/2022  The skin of the feet is intact without sores or ulcerations. The pedal pulses are 2+ on right and 2+ on left. The sensation is decreased  to a screening 5.07, 10 gram monofilament on the right      DATA REVIEWED:  Lab Results  Component Value Date   HGBA1C 8.5 (H) 01/21/2022   HGBA1C 7.8 (A) 07/16/2021   HGBA1C 7.5 (A) 03/12/2021    Latest Reference Range & Units 01/21/22 15:57  Sodium 135 - 145 mEq/L 129 (L)  Potassium 3.5 - 5.1 mEq/L 4.2  Chloride 96 - 112 mEq/L 94 (L)  CO2 19 - 32 mEq/L 31  Glucose 70 - 99 mg/dL 179 (H)  BUN 6 - 23 mg/dL 16  Creatinine 0.40 - 1.50 mg/dL 0.80  Calcium 8.4 - 10.5 mg/dL 9.7  Alkaline Phosphatase 39 - 117 U/L 66  Albumin 3.5 - 5.2 g/dL 4.0  AST 0 - 37 U/L 22  ALT 0 - 53 U/L 25  Total Protein 6.0 - 8.3 g/dL 7.6  Total Bilirubin 0.2 - 1.2 mg/dL 0.2  GFR >60.00 mL/min 98.23    In office BG 165 mg/dL   ASSESSMENT / PLAN / RECOMMENDATIONS:   1) Type 2 Diabetes Mellitus, Sub-OPtimally controlled, With Neuropathic and macrovascular  complications - Most recent A1c of 7.3 %. Goal A1c < 7.0 %.    - Praised  pt on  improved glycemic control from 8.5 % to 7.3 %  -We discussed the importance of avoiding sugar sweetened beverages and limiting snacks -I have encouraged the patient that the better he does with low-carb diet the less medication he  will be on -Given hypoglycemic episodes, will reduce Toujeo as below -I will increase Jardiance and Rybelsus as he is tolerating them well -He does not recall being on higher doses of metformin, no change at this time   MEDICATIONS: Increase Jardiance 25 mg daily Increase Rybelsus 7 mg daily Continue metformin 500 mg daily Continue carbose 25 mg 3 times daily Decrease Toujeo 58 units daily  EDUCATION / INSTRUCTIONS: BG monitoring instructions: Patient is instructed to check his blood sugars 1 times a day, fasting . Call [facility] if: BG persistently < 70  I reviewed the Rule of 15 for the treatment of hypoglycemia in detail with the patient. Literature supplied.    2) Diabetic complications:  Eye: Does not have known diabetic retinopathy.  Neuro/ Feet: Does  have known diabetic peripheral neuropathy .  Renal: Patient does not have known baseline CKD. He   is  on an ACEI/ARB at present.    3) Right LE infection :   -Patient was prescribed doxycycline 100 mg twice daily x10 days  F/U in 6 months   Signed electronically by: Mack Guise, MD  [facility]  [address] [place], [place] [address] Phone: [phone] FAX: [phone]   CC: Cassandria Anger, MD [place] [address] Phone: [phone]  Fax: [phone]  Return to Endocrinology clinic as below: Future Appointments  Date Time Provider [place]  07/23/2022  3:20 PM Plotnikov, Evie Lacks, MD [id number] None

## 2022-06-23 NOTE — Patient Instructions (Signed)
Decrease Toujeo to 58 units daily  Increase Jardiance 25 mg, 1 tablet daily  Increase Rybelsus 7 mg, 1 tablet daily  Continue Metformin 500 mg, 1 tablet daily  Continue Acarbose 25 mg , 1 tablet three times a day      HOW TO TREAT LOW BLOOD SUGARS (Blood sugar LESS THAN 70 MG/DL) Please follow the RULE OF 15 for the treatment of hypoglycemia treatment (when your (blood sugars are less than 70 mg/dL)   STEP 1: Take 15 grams of carbohydrates when your blood sugar is low, which includes:  3-4 GLUCOSE TABS  OR 3-4 OZ OF JUICE OR REGULAR SODA OR ONE TUBE OF GLUCOSE GEL    STEP 2: RECHECK blood sugar in 15 MINUTES STEP 3: If your blood sugar is still low at the 15 minute recheck --> then, go back to STEP 1 and treat AGAIN with another 15 grams of carbohydrates.

## 2022-06-28 ENCOUNTER — Other Ambulatory Visit: Payer: Self-pay | Admitting: Internal Medicine

## 2022-06-30 ENCOUNTER — Telehealth: Payer: Self-pay

## 2022-06-30 NOTE — Telephone Encounter (Signed)
Patient spouse advised on medication changes and verbalized understanding

## 2022-06-30 NOTE — Telephone Encounter (Signed)
Patient spouse states that the patient BS have been dropping low all throughout the day since the medication adjustments. She didn't have exact readings to give me as patient is at work. She states that he has been in the 70-80 range in the evenings.

## 2022-07-23 ENCOUNTER — Encounter: Payer: Self-pay | Admitting: Internal Medicine

## 2022-07-23 ENCOUNTER — Ambulatory Visit: Payer: No Typology Code available for payment source | Admitting: Internal Medicine

## 2022-07-23 VITALS — BP 142/72 | HR 76 | Temp 98.2°F | Ht 69.0 in | Wt 229.0 lb

## 2022-07-23 DIAGNOSIS — E1165 Type 2 diabetes mellitus with hyperglycemia: Secondary | ICD-10-CM

## 2022-07-23 DIAGNOSIS — E785 Hyperlipidemia, unspecified: Secondary | ICD-10-CM | POA: Diagnosis not present

## 2022-07-23 DIAGNOSIS — E538 Deficiency of other specified B group vitamins: Secondary | ICD-10-CM

## 2022-07-23 DIAGNOSIS — I10 Essential (primary) hypertension: Secondary | ICD-10-CM | POA: Diagnosis not present

## 2022-07-23 MED ORDER — MORPHINE SULFATE ER 100 MG PO TBCR: 200.0000 mg | EXTENDED_RELEASE_TABLET | Freq: Three times a day (TID) | ORAL | 0 refills | Status: DC

## 2022-07-23 MED ORDER — MORPHINE SULFATE 15 MG PO TABS: 15.0000 mg | ORAL_TABLET | Freq: Two times a day (BID) | ORAL | 0 refills | Status: DC | PRN

## 2022-07-23 NOTE — Assessment & Plan Note (Signed)
Cont w/Amlodipine-Valsartan NAS diet Wt loss

## 2022-07-23 NOTE — Progress Notes (Signed)
Subjective:  Patient ID: [name] [name], male    DOB: 04/20/1966  Age: 56 y.o. MRN: [id number]  CC: Follow-up (3 MONTH F/U)   HPI [name] [name] presents for LBP, DM, HTN  Outpatient Medications Prior to Visit  Medication Sig Dispense Refill   acarbose (PRECOSE) 25 MG tablet Take 1 tablet (25 mg total) by mouth 3 (three) times daily with meals. 90 tablet 3   amLODipine-valsartan (EXFORGE) 10-320 MG tablet TAKE ONE TABLET EVERY DAY 90 tablet 2   ANORO ELLIPTA 62.5-25 MCG/ACT AEPB INHALE ONE PUFF BY MOUTH DAILY 60 each 5   aspirin EC 81 MG tablet Take 81 mg by mouth daily.     Cholecalciferol (VITAMIN D3) 50 MCG (2000 UT) capsule Take 1 capsule (2,000 Units total) by mouth daily. 100 capsule 3   cyanocobalamin (,VITAMIN B-12,) 1000 MCG/ML injection Give 1000 mcg of Vitamin b12 sq daily x 1 week, then weekly for 1 month, then once every 2 weeks 10 mL 6   empagliflozin (JARDIANCE) 25 MG TABS tablet Take 1 tablet (25 mg total) by mouth daily before breakfast. 90 tablet 3   fluticasone (FLONASE) 50 MCG/ACT nasal spray SPRAY 2 SPRAYS INTO EACH NOSTRIL EVERY DAY 48 mL 1   furosemide (LASIX) 20 MG tablet TAKE 1 OR 2 TABLETS BY MOUTH DAILY AS NEEDED 60 tablet 3   insulin glargine, 2 Unit Dial, (TOUJEO MAX SOLOSTAR) 300 UNIT/ML Solostar Pen Inject 58 Units into the skin every morning. And pen needles 1/day. 15 mL 6   Insulin Pen Needle (B-D ULTRAFINE III SHORT PEN) 31G X 8 MM MISC 1 Device by Other route daily in the afternoon. as directed 100 each 3   Lancets (ONETOUCH DELICA PLUS LANCET33G) MISC USE TO check blood sugar TWICE DAILY 100 each 3   lovastatin (MEVACOR) 20 MG tablet Take 1 tablet (20 mg total) by mouth daily. 90 tablet 3   metFORMIN (GLUCOPHAGE-XR) 500 MG 24 hr tablet Take 1 tablet (500 mg total) by mouth daily in the afternoon. 90 tablet 3   mupirocin ointment (BACTROBAN) 2 % On leg wound w/dressing change bid 30 g 0   naloxone (NARCAN) nasal spray 4 mg/0.1 mL 1 actuation in one  nostril once. May repeat in 2-3 min 1 each 2   ONETOUCH ULTRA test strip USE TO TEST BLOOD SUGAR TWICE DAILY 100 strip 2   Semaglutide (RYBELSUS) 7 MG TABS Take 7 mg by mouth daily. 90 tablet 3   SYRINGE-NEEDLE, DISP, 3 ML (BD ECLIPSE SYRINGE) 25G X 1" 3 ML MISC Use for vitamin B12 shots as directed 50 each 3   Vitamin D, Ergocalciferol, (DRISDOL) 1.25 MG (50000 UNIT) CAPS capsule Take 1 capsule (50,000 Units total) by mouth every 7 (seven) days. 8 capsule 0   morphine (MS CONTIN) 100 MG 12 hr tablet Take 2 tablets (200 mg total) by mouth 3 (three) times daily. 180 tablet 0   morphine (MS CONTIN) 100 MG 12 hr tablet Take 2 tablets (200 mg total) by mouth 3 (three) times daily. 180 tablet 0   morphine (MS CONTIN) 100 MG 12 hr tablet Take 2 tablets (200 mg total) by mouth 3 (three) times daily. 180 tablet 0   morphine (MSIR) 15 MG tablet Take 1 tablet (15 mg total) by mouth 2 (two) times daily as needed for severe pain. 60 tablet 0   morphine (MSIR) 15 MG tablet Take 1 tablet (15 mg total) by mouth 2 (two) times daily as needed for  severe pain. 60 tablet 0   morphine (MSIR) 15 MG tablet Take 1 tablet (15 mg total) by mouth 2 (two) times daily as needed for severe pain. 60 tablet 0   doxycycline (VIBRA-TABS) 100 MG tablet Take 1 tablet (100 mg total) by mouth 2 (two) times daily. (Patient not taking: Reported on 07/23/2022) 20 tablet 0   No facility-administered medications prior to visit.    ROS: Review of Systems  Constitutional:  Negative for appetite change, fatigue and unexpected weight change.  HENT:  Negative for congestion, nosebleeds, sneezing, sore throat and trouble swallowing.   Eyes:  Negative for itching and visual disturbance.  Respiratory:  Negative for cough.   Cardiovascular:  Negative for chest pain, palpitations and leg swelling.  Gastrointestinal:  Negative for abdominal distention, blood in stool, diarrhea and nausea.  Genitourinary:  Negative for frequency and hematuria.   Musculoskeletal:  Positive for arthralgias, back pain and gait problem. Negative for joint swelling and neck pain.  Skin:  Negative for rash.  Neurological:  Negative for dizziness, tremors, speech difficulty and weakness.  Psychiatric/Behavioral:  Negative for agitation, dysphoric mood, sleep disturbance and suicidal ideas. The patient is not nervous/anxious.     Objective:  BP (!) 142/72 (BP Location: Left Arm)   Pulse 76   Temp 98.2 F (36.8 C) (Oral)   Ht 5\' 9"  (1.753 m)   Wt 229 lb (103.9 kg)   SpO2 97%   BMI 33.82 kg/m   BP Readings from Last 3 Encounters:  07/23/22 (!) 142/72  06/23/22 (!) 140/78  04/23/22 (!) 142/70    Wt Readings from Last 3 Encounters:  07/23/22 229 lb (103.9 kg)  06/23/22 234 lb (106.1 kg)  04/23/22 237 lb 12.8 oz (107.9 kg)    Physical Exam Constitutional:      General: He is not in acute distress.    Appearance: He is well-developed. He is obese.     Comments: NAD  Eyes:     Conjunctiva/sclera: Conjunctivae normal.     Pupils: Pupils are equal, round, and reactive to light.  Neck:     Thyroid: No thyromegaly.     Vascular: No JVD.  Cardiovascular:     Rate and Rhythm: Normal rate and regular rhythm.     Heart sounds: Normal heart sounds. No murmur heard.    No friction rub. No gallop.  Pulmonary:     Effort: Pulmonary effort is normal. No respiratory distress.     Breath sounds: Normal breath sounds. No wheezing or rales.  Chest:     Chest wall: No tenderness.  Abdominal:     General: Bowel sounds are normal. There is no distension.     Palpations: Abdomen is soft. There is no mass.     Tenderness: There is no abdominal tenderness. There is no guarding or rebound.  Musculoskeletal:        General: No tenderness. Normal range of motion.     Cervical back: Normal range of motion.  Lymphadenopathy:     Cervical: No cervical adenopathy.  Skin:    General: Skin is warm and dry.     Findings: No rash.  Neurological:     Mental  Status: He is alert and oriented to person, place, and time.     Cranial Nerves: No cranial nerve deficit.     Motor: No abnormal muscle tone.     Coordination: Coordination normal.     Gait: Gait normal.     Deep Tendon Reflexes: Reflexes  are normal and symmetric.  Psychiatric:        Behavior: Behavior normal.        Thought Content: Thought content normal.        Judgment: Judgment normal.     Lab Results  Component Value Date   WBC 11.0 (H) 01/21/2022   HGB 14.4 01/21/2022   HCT 43.2 01/21/2022   PLT 186.0 01/21/2022   GLUCOSE 179 (H) 01/21/2022   CHOL 129 05/02/2021   TRIG 110.0 05/02/2021   HDL 44.30 05/02/2021   LDLDIRECT 116.6 05/20/2007   LDLCALC 63 05/02/2021   ALT 25 01/21/2022   AST 22 01/21/2022   NA 129 (L) 01/21/2022   K 4.2 01/21/2022   CL 94 (L) 01/21/2022   CREATININE 0.80 01/21/2022   BUN 16 01/21/2022   CO2 31 01/21/2022   TSH 3.88 01/21/2022   PSA 0.20 04/23/2018   INR 1.1 05/10/2008   HGBA1C 7.3 (A) 06/23/2022   MICROALBUR <0.7 05/02/2021    No results found.  Assessment & Plan:   Problem List Items Addressed This Visit     Uncontrolled type 2 diabetes mellitus with hyperglycemia ([place]) - Primary    Dr Kelton Pillar Better w/wt loss Humalog, Basaglar, Rybelsus Stable.  Continue with Humalog, Basaglar, Rybelsus.  Continue with regular appointment with Dr. Loanne Drilling.  He is losing weight on Rybelsus      Essential hypertension    Cont w/Amlodipine-Valsartan NAS diet Wt loss      Dyslipidemia    Chronic  Declined statins. Lovastatin Dr Stanford Breed      B12 deficiency    On B12         Meds ordered this encounter  Medications   morphine (MS CONTIN) 100 MG 12 hr tablet    Sig: Take 2 tablets (200 mg total) by mouth 3 (three) times daily.    Dispense:  180 tablet    Refill:  0    Fill on or after 07/28/22   morphine (MS CONTIN) 100 MG 12 hr tablet    Sig: Take 2 tablets (200 mg total) by mouth 3 (three) times daily.    Dispense:   180 tablet    Refill:  0    Fill on or after 08/27/22   morphine (MS CONTIN) 100 MG 12 hr tablet    Sig: Take 2 tablets (200 mg total) by mouth 3 (three) times daily.    Dispense:  180 tablet    Refill:  0    Fill on or after 09/26/22   morphine (MSIR) 15 MG tablet    Sig: Take 1 tablet (15 mg total) by mouth 2 (two) times daily as needed for severe pain.    Dispense:  60 tablet    Refill:  0    Please fill on or after 07/28/22   morphine (MSIR) 15 MG tablet    Sig: Take 1 tablet (15 mg total) by mouth 2 (two) times daily as needed for severe pain.    Dispense:  60 tablet    Refill:  0    Please fill on or after 08/27/22   morphine (MSIR) 15 MG tablet    Sig: Take 1 tablet (15 mg total) by mouth 2 (two) times daily as needed for severe pain.    Dispense:  60 tablet    Refill:  0    Please fill on or after 09/26/22      Follow-up: Return in about 3 months (around 10/23/2022) for a follow-up visit.  Walker Kehr, MD

## 2022-07-23 NOTE — Assessment & Plan Note (Signed)
On B12 

## 2022-07-23 NOTE — Assessment & Plan Note (Signed)
Chronic  Declined statins. Lovastatin Dr Jens Som

## 2022-07-23 NOTE — Assessment & Plan Note (Signed)
Dr Lonzo Cloud Better w/wt loss Humalog, Basaglar, Rybelsus Stable.  Continue with Humalog, Basaglar, Rybelsus.  Continue with regular appointment with Dr. Everardo All.  He is losing weight on Rybelsus

## 2022-08-11 ENCOUNTER — Other Ambulatory Visit (HOSPITAL_COMMUNITY): Payer: Self-pay

## 2022-08-11 ENCOUNTER — Telehealth: Payer: Self-pay

## 2022-08-11 NOTE — Telephone Encounter (Signed)
Pharmacy Patient Advocate Encounter  Prior Authorization for RYBELSUS 7MG  has been approved.    Effective dates: 08/11/22 through 08/10/23  Received notification from [facility] that prior authorization for RYBELSUS 7MG  is needed.    PA submitted on 08/11/22 Key [id number] Status is pending  , CPhT Pharmacy Patient Advocate Specialist Direct Number: [phone] Fax: [phone]

## 2022-08-12 NOTE — Telephone Encounter (Signed)
Patient spouse [name] notified

## 2022-08-29 ENCOUNTER — Telehealth: Payer: Self-pay | Admitting: *Deleted

## 2022-08-29 NOTE — Telephone Encounter (Signed)
Pt need PA for Morphine Sulfate 100 mg submitted w/ Key: [id number]. Waiting on approval status.[name] Kitchenlmb  Pt also have PA for Morphine 15mg  submitted w/ (Key: [id number]). [name] [name]

## 2022-09-02 NOTE — Telephone Encounter (Signed)
Check status on coverage Morphine Sulfate has been on August 29, 2022 it states will mail denial letter. Check status for Morphine 15 mg rx denied on August 29, 2022.Marland KitchenChryl Heck

## 2022-10-02 ENCOUNTER — Other Ambulatory Visit (HOSPITAL_COMMUNITY): Payer: Self-pay

## 2022-10-07 ENCOUNTER — Other Ambulatory Visit: Payer: Self-pay | Admitting: Internal Medicine

## 2022-10-07 DIAGNOSIS — E1165 Type 2 diabetes mellitus with hyperglycemia: Secondary | ICD-10-CM

## 2022-10-09 ENCOUNTER — Telehealth: Payer: Self-pay | Admitting: *Deleted

## 2022-10-09 NOTE — Telephone Encounter (Signed)
Pt need PA on Morphine 15mg . Submitted w/ (Key: [id number]) PA has been submitted to [place].Marland KitchenJohny Chess

## 2022-10-09 NOTE — Telephone Encounter (Signed)
Rec'd msg This request has received a Cancelled outcome.This may mean either your patient does not have active coverage with this plan, this authorization was processed as a duplicate request, or an authorization was not needed for this medication.

## 2022-10-10 NOTE — Telephone Encounter (Signed)
Pharmacy Patient Advocate Encounter  Prior Authorization for Morphine Sulfate ER 100MG has been approved.    PA# [id number] Effective dates: 10/05/2022 through 10/04/2023

## 2022-10-23 ENCOUNTER — Encounter: Payer: Self-pay | Admitting: Internal Medicine

## 2022-10-23 ENCOUNTER — Ambulatory Visit: Payer: BC Managed Care – PPO | Admitting: Internal Medicine

## 2022-10-23 VITALS — BP 120/68 | HR 73 | Temp 97.8°F | Ht 69.0 in | Wt 226.0 lb

## 2022-10-23 DIAGNOSIS — M79604 Pain in right leg: Secondary | ICD-10-CM

## 2022-10-23 DIAGNOSIS — M545 Low back pain, unspecified: Secondary | ICD-10-CM | POA: Diagnosis not present

## 2022-10-23 DIAGNOSIS — I10 Essential (primary) hypertension: Secondary | ICD-10-CM | POA: Diagnosis not present

## 2022-10-23 DIAGNOSIS — M79605 Pain in left leg: Secondary | ICD-10-CM

## 2022-10-23 DIAGNOSIS — I2510 Atherosclerotic heart disease of native coronary artery without angina pectoris: Secondary | ICD-10-CM

## 2022-10-23 DIAGNOSIS — E1165 Type 2 diabetes mellitus with hyperglycemia: Secondary | ICD-10-CM

## 2022-10-23 DIAGNOSIS — Z0000 Encounter for general adult medical examination without abnormal findings: Secondary | ICD-10-CM

## 2022-10-23 DIAGNOSIS — E559 Vitamin D deficiency, unspecified: Secondary | ICD-10-CM

## 2022-10-23 DIAGNOSIS — I2583 Coronary atherosclerosis due to lipid rich plaque: Secondary | ICD-10-CM

## 2022-10-23 MED ORDER — MORPHINE SULFATE 15 MG PO TABS: 15.0000 mg | ORAL_TABLET | Freq: Two times a day (BID) | ORAL | 0 refills | Status: DC | PRN

## 2022-10-23 MED ORDER — MORPHINE SULFATE ER 100 MG PO TBCR: 200.0000 mg | EXTENDED_RELEASE_TABLET | Freq: Three times a day (TID) | ORAL | 0 refills | Status: DC

## 2022-10-23 NOTE — Assessment & Plan Note (Signed)
Much better! On Rybelsus 7 mg/d - tolerating well

## 2022-10-23 NOTE — Assessment & Plan Note (Signed)
Continue with MSIR, MSContin   Potential benefits of a long term opioids  use as well as potential risks  and complications were explained to the patient and were aknowledged. He is maintaining stooped over posture when standing/walking UDS

## 2022-10-23 NOTE — Assessment & Plan Note (Signed)
Cont w/Amlodipine-Valsartan NAS diet

## 2022-10-23 NOTE — Progress Notes (Signed)
Subjective:  Patient ID: [name] [name], male    DOB: May 30, 1966  Age: 57 y.o. MRN: [id number]  CC: No chief complaint on file.   HPI SYMEON SCHEID presents for LBP, DM, HTN  Outpatient Medications Prior to Visit  Medication Sig Dispense Refill   amLODipine-valsartan (EXFORGE) 10-320 MG tablet TAKE ONE TABLET EVERY DAY 90 tablet 2   ANORO ELLIPTA 62.5-25 MCG/ACT AEPB INHALE ONE PUFF BY MOUTH DAILY 60 each 5   aspirin EC 81 MG tablet Take 81 mg by mouth daily.     Cholecalciferol (VITAMIN D3) 50 MCG (2000 UT) capsule Take 1 capsule (2,000 Units total) by mouth daily. 100 capsule 3   cyanocobalamin (,VITAMIN B-12,) 1000 MCG/ML injection Give 1000 mcg of Vitamin b12 sq daily x 1 week, then weekly for 1 month, then once every 2 weeks 10 mL 6   empagliflozin (JARDIANCE) 25 MG TABS tablet Take 1 tablet (25 mg total) by mouth daily before breakfast. 90 tablet 3   fluticasone (FLONASE) 50 MCG/ACT nasal spray SPRAY 2 SPRAYS INTO EACH NOSTRIL EVERY DAY 48 mL 1   furosemide (LASIX) 20 MG tablet TAKE 1 OR 2 TABLETS BY MOUTH DAILY AS NEEDED 60 tablet 3   insulin glargine, 2 Unit Dial, (TOUJEO MAX SOLOSTAR) 300 UNIT/ML Solostar Pen Inject 58 Units into the skin every morning. And pen needles 1/day. 15 mL 6   Insulin Pen Needle (B-D ULTRAFINE III SHORT PEN) 31G X 8 MM MISC 1 Device by Other route daily in the afternoon. as directed 100 each 3   Lancets (ONETOUCH DELICA PLUS 123XX123) MISC USE TO check blood sugar TWICE DAILY 100 each 3   lovastatin (MEVACOR) 20 MG tablet Take 1 tablet (20 mg total) by mouth daily. 90 tablet 3   metFORMIN (GLUCOPHAGE-XR) 500 MG 24 hr tablet Take 1 tablet (500 mg total) by mouth daily in the afternoon. 90 tablet 3   mupirocin ointment (BACTROBAN) 2 % On leg wound w/dressing change bid 30 g 0   naloxone (NARCAN) nasal spray 4 mg/0.1 mL 1 actuation in one nostril once. May repeat in 2-3 min 1 each 2   ONETOUCH ULTRA test strip USE TO TEST BLOOD SUGAR TWICE DAILY 100  strip 2   Semaglutide (RYBELSUS) 7 MG TABS Take 7 mg by mouth daily. 90 tablet 3   SYRINGE-NEEDLE, DISP, 3 ML (BD ECLIPSE SYRINGE) 25G X 1" 3 ML MISC Use for vitamin B12 shots as directed 50 each 3   Vitamin D, Ergocalciferol, (DRISDOL) 1.25 MG (50000 UNIT) CAPS capsule Take 1 capsule (50,000 Units total) by mouth every 7 (seven) days. 8 capsule 0   acarbose (PRECOSE) 25 MG tablet Take 1 tablet (25 mg total) by mouth 3 (three) times daily with meals. 90 tablet 3   morphine (MS CONTIN) 100 MG 12 hr tablet Take 2 tablets (200 mg total) by mouth 3 (three) times daily. 180 tablet 0   morphine (MS CONTIN) 100 MG 12 hr tablet Take 2 tablets (200 mg total) by mouth 3 (three) times daily. 180 tablet 0   morphine (MS CONTIN) 100 MG 12 hr tablet Take 2 tablets (200 mg total) by mouth 3 (three) times daily. 180 tablet 0   morphine (MSIR) 15 MG tablet Take 1 tablet (15 mg total) by mouth 2 (two) times daily as needed for severe pain. 60 tablet 0   morphine (MSIR) 15 MG tablet Take 1 tablet (15 mg total) by mouth 2 (two) times daily as needed  for severe pain. 60 tablet 0   morphine (MSIR) 15 MG tablet Take 1 tablet (15 mg total) by mouth 2 (two) times daily as needed for severe pain. 60 tablet 0   No facility-administered medications prior to visit.    ROS: Review of Systems  Constitutional:  Negative for appetite change, fatigue and unexpected weight change.  HENT:  Negative for congestion, nosebleeds, sneezing, sore throat and trouble swallowing.   Eyes:  Negative for itching and visual disturbance.  Respiratory:  Negative for cough.   Cardiovascular:  Negative for chest pain, palpitations and leg swelling.  Gastrointestinal:  Negative for abdominal distention, blood in stool, diarrhea and nausea.  Genitourinary:  Negative for frequency and hematuria.  Musculoskeletal:  Positive for arthralgias and back pain. Negative for gait problem, joint swelling and neck pain.  Skin:  Negative for rash.   Neurological:  Negative for dizziness, tremors, speech difficulty and weakness.  Psychiatric/Behavioral:  Negative for agitation, dysphoric mood, sleep disturbance and suicidal ideas. The patient is not nervous/anxious.     Objective:  BP 120/68 (BP Location: Left Arm, Patient Position: Sitting, Cuff Size: Normal)   Pulse 73   Temp 97.8 F (36.6 C) (Oral)   Ht 5' 9"$  (1.753 m)   Wt 226 lb (102.5 kg)   SpO2 96%   BMI 33.37 kg/m   BP Readings from Last 3 Encounters:  10/23/22 120/68  07/23/22 (!) 142/72  06/23/22 (!) 140/78    Wt Readings from Last 3 Encounters:  10/23/22 226 lb (102.5 kg)  07/23/22 229 lb (103.9 kg)  06/23/22 234 lb (106.1 kg)    Physical Exam Constitutional:      General: He is not in acute distress.    Appearance: He is well-developed. He is obese.     Comments: NAD  Eyes:     Conjunctiva/sclera: Conjunctivae normal.     Pupils: Pupils are equal, round, and reactive to light.  Neck:     Thyroid: No thyromegaly.     Vascular: No JVD.  Cardiovascular:     Rate and Rhythm: Normal rate and regular rhythm.     Heart sounds: Normal heart sounds. No murmur heard.    No friction rub. No gallop.  Pulmonary:     Effort: Pulmonary effort is normal. No respiratory distress.     Breath sounds: Normal breath sounds. No wheezing, rhonchi or rales.  Chest:     Chest wall: No tenderness.  Abdominal:     General: Bowel sounds are normal. There is no distension.     Palpations: Abdomen is soft. There is no mass.     Tenderness: There is no abdominal tenderness. There is no guarding or rebound.  Musculoskeletal:        General: Tenderness present. Normal range of motion.     Cervical back: Normal range of motion.     Right lower leg: No edema.     Left lower leg: No edema.  Lymphadenopathy:     Cervical: No cervical adenopathy.  Skin:    General: Skin is warm and dry.     Findings: No rash.  Neurological:     Mental Status: He is alert and oriented to  person, place, and time.     Cranial Nerves: No cranial nerve deficit.     Motor: No abnormal muscle tone.     Coordination: Coordination normal.     Gait: Gait normal.     Deep Tendon Reflexes: Reflexes are normal and symmetric.  Psychiatric:  Behavior: Behavior normal.        Thought Content: Thought content normal.        Judgment: Judgment normal.     Lab Results  Component Value Date   WBC 11.0 (H) 01/21/2022   HGB 14.4 01/21/2022   HCT 43.2 01/21/2022   PLT 186.0 01/21/2022   GLUCOSE 179 (H) 01/21/2022   CHOL 129 05/02/2021   TRIG 110.0 05/02/2021   HDL 44.30 05/02/2021   LDLDIRECT 116.6 05/20/2007   LDLCALC 63 05/02/2021   ALT 25 01/21/2022   AST 22 01/21/2022   NA 129 (L) 01/21/2022   K 4.2 01/21/2022   CL 94 (L) 01/21/2022   CREATININE 0.80 01/21/2022   BUN 16 01/21/2022   CO2 31 01/21/2022   TSH 3.88 01/21/2022   PSA 0.20 04/23/2018   INR 1.1 05/10/2008   HGBA1C 7.3 (A) 06/23/2022   MICROALBUR <0.7 05/02/2021    No results found.  Assessment & Plan:   Problem List Items Addressed This Visit       Cardiovascular and Mediastinum   Essential hypertension - Primary    Cont w/Amlodipine-Valsartan NAS diet      Relevant Orders   TSH   Comprehensive metabolic panel   Microalbumin / creatinine urine ratio   CAD (coronary artery disease)    No angina        Endocrine   Uncontrolled type 2 diabetes mellitus with hyperglycemia ([place])    Much better! On Rybelsus 7 mg/d - tolerating well        Other   Vitamin D deficiency    On Vit D      Low back pain radiating to both legs    Continue with MSIR, MSContin   Potential benefits of a long term opioids  use as well as potential risks  and complications were explained to the patient and were aknowledged. He is maintaining stooped over posture when standing/walking UDS      Relevant Medications   morphine (MS CONTIN) 100 MG 12 hr tablet   morphine (MS CONTIN) 100 MG 12 hr tablet    morphine (MS CONTIN) 100 MG 12 hr tablet   morphine (MSIR) 15 MG tablet   morphine (MSIR) 15 MG tablet   morphine (MSIR) 15 MG tablet   Other Relevant Orders   Pain Management Screening Profile (10S)   Other Visit Diagnoses     Well adult exam       Relevant Orders   TSH   Urinalysis   CBC with Differential/Platelet   Lipid panel   Comprehensive metabolic panel   PSA   Microalbumin / creatinine urine ratio         Meds ordered this encounter  Medications   morphine (MS CONTIN) 100 MG 12 hr tablet    Sig: Take 2 tablets (200 mg total) by mouth 3 (three) times daily.    Dispense:  180 tablet    Refill:  0    Fill on or after 10/26/22   morphine (MS CONTIN) 100 MG 12 hr tablet    Sig: Take 2 tablets (200 mg total) by mouth 3 (three) times daily.    Dispense:  180 tablet    Refill:  0    Fill on or after 11/25/22   morphine (MS CONTIN) 100 MG 12 hr tablet    Sig: Take 2 tablets (200 mg total) by mouth 3 (three) times daily.    Dispense:  180 tablet    Refill:  0  Fill on or after 12/25/22   morphine (MSIR) 15 MG tablet    Sig: Take 1 tablet (15 mg total) by mouth 2 (two) times daily as needed for severe pain.    Dispense:  60 tablet    Refill:  0    Fill on or after 10/26/22   morphine (MSIR) 15 MG tablet    Sig: Take 1 tablet (15 mg total) by mouth 2 (two) times daily as needed for severe pain.    Dispense:  60 tablet    Refill:  0    Fill on or after 11/25/22   morphine (MSIR) 15 MG tablet    Sig: Take 1 tablet (15 mg total) by mouth 2 (two) times daily as needed for severe pain.    Dispense:  60 tablet    Refill:  0    Fill on or after 12/25/22      Follow-up: Return in about 3 months (around 01/21/2023) for Wellness Exam.  Walker Kehr, MD

## 2022-10-23 NOTE — Assessment & Plan Note (Signed)
On Vit D 

## 2022-10-23 NOTE — Assessment & Plan Note (Signed)
No angina 

## 2022-10-28 ENCOUNTER — Telehealth: Payer: Self-pay | Admitting: Internal Medicine

## 2022-10-28 NOTE — Telephone Encounter (Signed)
Patient's wife called and said they need a prior authorization for morphine (MS CONTIN) 100 MG 12 hr tablet . It has only been approved to have 90 tablets and they need to have approval for 180 tablets. Best callback number is [phone].

## 2022-10-31 ENCOUNTER — Telehealth: Payer: Self-pay

## 2022-10-31 ENCOUNTER — Other Ambulatory Visit (HOSPITAL_COMMUNITY): Payer: Self-pay

## 2022-10-31 NOTE — Telephone Encounter (Signed)
Pharmacy Patient Advocate Encounter   Received notification that prior authorization for Morphine Sulfate ER '100MG'$  er tablets is required/requested.  Per Test Claim: Maximum daily dose is 3.00   PA submitted on 10/31/22 to (ins) [place] [employer] via [employer] or [facility]) confirmation # [id number] Status is pending

## 2022-10-31 NOTE — Telephone Encounter (Signed)
PA submitted via CMM. [id number]. Status pending

## 2022-11-03 ENCOUNTER — Other Ambulatory Visit (HOSPITAL_COMMUNITY): Payer: Self-pay

## 2022-11-03 NOTE — Telephone Encounter (Signed)
PA denied. Routed pool in telephone encounter for PA

## 2022-11-03 NOTE — Telephone Encounter (Signed)
Pharmacy Patient Advocate Encounter  Received notification from [facility] that the request for prior authorization for Morphine Sulfate ER '100MG'$  er tablets has been denied due to Not meeting the definition of medical necessity.      Please be advised we currently do not have a Pharmacist to review denials, therefore you will need to process appeals accordingly as needed. Thanks for your support at this time.   You may fax [phone] to appeal.

## 2022-11-05 NOTE — Telephone Encounter (Signed)
Called pt spoke w/ wife gave her information on the morphine. The wife states yes there interest in MD prescribing the 200 mg TID.Marland KitchenJohny Chess

## 2022-11-05 NOTE — Telephone Encounter (Signed)
Noted.  Please inform the patient.  Thanks

## 2022-11-07 MED ORDER — MORPHINE SULFATE ER 200 MG PO TBCR: 200.0000 mg | EXTENDED_RELEASE_TABLET | Freq: Three times a day (TID) | ORAL | 0 refills | Status: DC

## 2022-11-07 NOTE — Addendum Note (Signed)
Addended by: Cassandria Anger on: 11/07/2022 10:33 AM   Modules accepted: Orders

## 2022-11-07 NOTE — Telephone Encounter (Signed)
Okay.  Thanks.

## 2022-11-15 ENCOUNTER — Other Ambulatory Visit: Payer: Self-pay | Admitting: Internal Medicine

## 2022-11-26 ENCOUNTER — Telehealth: Payer: Self-pay | Admitting: Internal Medicine

## 2022-11-26 NOTE — Telephone Encounter (Signed)
Patient's wife called and said that his morphine prescription for this month was written for just 60 instead of 90.  The other two months were correct.

## 2022-11-27 MED ORDER — MORPHINE SULFATE ER 200 MG PO TBCR: 200.0000 mg | EXTENDED_RELEASE_TABLET | Freq: Three times a day (TID) | ORAL | 0 refills | Status: DC

## 2022-11-27 NOTE — Telephone Encounter (Signed)
Notified pt wife MD corrected the quantity. New rx has been sent to [facility].Marland KitchenJohny Chess

## 2022-11-27 NOTE — Telephone Encounter (Signed)
Quantitative corrected to 90.  Thank you

## 2022-12-24 ENCOUNTER — Ambulatory Visit: Payer: BC Managed Care – PPO | Admitting: Internal Medicine

## 2022-12-24 ENCOUNTER — Encounter: Payer: Self-pay | Admitting: Internal Medicine

## 2022-12-24 VITALS — BP 126/80 | HR 67 | Ht 69.0 in | Wt 226.0 lb

## 2022-12-24 DIAGNOSIS — Z794 Long term (current) use of insulin: Secondary | ICD-10-CM | POA: Diagnosis not present

## 2022-12-24 DIAGNOSIS — E1165 Type 2 diabetes mellitus with hyperglycemia: Secondary | ICD-10-CM | POA: Diagnosis not present

## 2022-12-24 DIAGNOSIS — Z7984 Long term (current) use of oral hypoglycemic drugs: Secondary | ICD-10-CM | POA: Diagnosis not present

## 2022-12-24 DIAGNOSIS — Z7985 Long-term (current) use of injectable non-insulin antidiabetic drugs: Secondary | ICD-10-CM

## 2022-12-24 DIAGNOSIS — R32 Unspecified urinary incontinence: Secondary | ICD-10-CM

## 2022-12-24 DIAGNOSIS — N390 Urinary tract infection, site not specified: Secondary | ICD-10-CM

## 2022-12-24 LAB — POCT GLYCOSYLATED HEMOGLOBIN (HGB A1C): Hemoglobin A1C: 7.9 % — AB (ref 4.0–5.6)

## 2022-12-24 LAB — POCT GLUCOSE (DEVICE FOR HOME USE): POC Glucose: 158 mg/dl — AB (ref 70–99)

## 2022-12-24 MED ORDER — TOUJEO MAX SOLOSTAR 300 UNIT/ML ~~LOC~~ SOPN: 54.0000 [IU] | PEN_INJECTOR | SUBCUTANEOUS | 6 refills | Status: DC

## 2022-12-24 MED ORDER — DEXCOM G7 SENSOR MISC: 1.0000 | 3 refills | Status: DC

## 2022-12-24 MED ORDER — SEMAGLUTIDE (1 MG/DOSE) 4 MG/3ML ~~LOC~~ SOPN: 1.0000 mg | PEN_INJECTOR | SUBCUTANEOUS | 11 refills | Status: DC

## 2022-12-24 NOTE — Patient Instructions (Addendum)
Switch Rybelsus to Ozempic 1 mg weekly  Decrease Toujeo to 54 units daily  Continue  Jardiance 25 mg, 1 tablet daily  Continue Metformin 500 mg, 1 tablet daily      HOW TO TREAT LOW BLOOD SUGARS (Blood sugar LESS THAN 70 MG/DL) Please follow the RULE OF 15 for the treatment of hypoglycemia treatment (when your (blood sugars are less than 70 mg/dL)   STEP 1: Take 15 grams of carbohydrates when your blood sugar is low, which includes:  3-4 GLUCOSE TABS  OR 3-4 OZ OF JUICE OR REGULAR SODA OR ONE TUBE OF GLUCOSE GEL    STEP 2: RECHECK blood sugar in 15 MINUTES STEP 3: If your blood sugar is still low at the 15 minute recheck --> then, go back to STEP 1 and treat AGAIN with another 15 grams of carbohydrates.

## 2022-12-24 NOTE — Progress Notes (Unsigned)
Name: [name] [name]  Age/ Sex: 57 y.o., male   MRN/ DOB: [id number], Jul 27, 1966     PCP: Tresa Garter, MD   Reason for Endocrinology Evaluation: Type 2 Diabetes Mellitus  Initial Endocrine Consultative Visit: 09/17/2012    PATIENT IDENTIFIER: [name] [name] is a 57 y.o. male with a past medical history of HTN, CAD, DM, COPD. The patient has followed with Endocrinology clinic since 09/17/2012 for consultative assistance with management of his diabetes.  DIABETIC HISTORY:  [name] [name] was diagnosed with DM 2005, and started insulin therapy in 2007. His hemoglobin A1c has ranged from 10.6% in 2020, peaking at 7.5% in 2022.  He was followed by Dr. Everardo All from 2014 until March 2023   Stopped Acabose 07/2023  SUBJECTIVE:   During the last visit (06/23/2022):A1c 7.3%  Today (12/24/2022): [name] [name] is here for follow-up on diabetes management,  he is accompanied by his spouse . He checks his blood sugars 2 times daily. The patient has not had hypoglycemic episodes since the last clinic visit, which typically occur in the evening before Supper , and at bedtime   Snacks  occasionally  Denies nausea, vomiting or diarrhea  Has been noted with  urinary incontinent for the past months , denies nausea   HOME DIABETES REGIMEN:  Acarbose 25 mg BID- not taking  Jardiance 25 mg daily  Metformin 500 mg XR daily  Rybelsus 7 mg daily  Toujeo 58 units daily       Statin: yes ACE-I/ARB: yes    METER DOWNLOAD SUMMARY:n/a   DIABETIC COMPLICATIONS: Microvascular complications:  Neuropathy Denies: CKD Last Eye Exam: Completed 06/2021  Macrovascular complications:  CAD Denies:  CVA, PVD   HISTORY:  Past Medical History:  Past Medical History:  Diagnosis Date   COPD (chronic obstructive pulmonary disease)    Diabetes mellitus    GERD (gastroesophageal reflux disease)    Hyperlipidemia    Hypertension    Lower back pain    Nephrolithiasis    Dr Annabell Howells    Past Surgical History:  Past Surgical History:  Procedure Laterality Date   KNEE SURGERY     Social History:  reports that he has been smoking cigarettes. He has been smoking an average of 2 packs per day. He has never used smokeless tobacco. He reports that he does not drink alcohol and does not use drugs. Family History:  Family History  Problem Relation Age of Onset   Hypertension Mother    Diabetes Mother    CAD Mother    Heart disease Other        CAD male relative <60   Vision loss Sister      HOME MEDICATIONS: Allergies as of 12/24/2022       Reactions   Belviq [lorcaserin Hcl]    wierd   Benazepril Hcl    REACTION: cough   Nsaids    Can take short term Upset stomach        Medication List        Accurate as of December 24, 2022  3:46 PM. If you have any questions, ask your nurse or doctor.          STOP taking these medications    Rybelsus 7 MG Tabs Generic drug: Semaglutide Replaced by: Semaglutide (1 MG/DOSE) 4 MG/3ML Sopn Stopped by: Scarlette Shorts, MD   Vitamin D (Ergocalciferol) 1.25 MG (50000 UNIT) Caps capsule Commonly known as: DRISDOL Stopped by: Scarlette Shorts, MD  TAKE these medications    amLODipine-valsartan 10-320 MG tablet Commonly known as: EXFORGE TAKE ONE TABLET EVERY DAY   Anoro Ellipta 62.5-25 MCG/ACT Aepb Generic drug: umeclidinium-vilanterol INHALE ONE PUFF BY MOUTH DAILY   aspirin EC 81 MG tablet Take 81 mg by mouth daily.   B-D ULTRAFINE III SHORT PEN 31G X 8 MM Misc Generic drug: Insulin Pen Needle 1 Device by Other route daily in the afternoon. as directed   BD Eclipse Syringe 25G X 1" 3 ML Misc Generic drug: SYRINGE-NEEDLE (DISP) 3 ML Use for vitamin B12 shots as directed   cyanocobalamin 1000 MCG/ML injection Commonly known as: VITAMIN B12 Give 1000 mcg of Vitamin b12 sq daily x 1 week, then weekly for 1 month, then once every 2 weeks   Dexcom G7 Sensor Misc 1 Device by Does not  apply route as directed. Started by: Scarlette Shorts, MD   empagliflozin 25 MG Tabs tablet Commonly known as: Jardiance Take 1 tablet (25 mg total) by mouth daily before breakfast.   fluticasone 50 MCG/ACT nasal spray Commonly known as: FLONASE SPRAY 2 SPRAYS INTO EACH NOSTRIL EVERY DAY   furosemide 20 MG tablet Commonly known as: LASIX TAKE 1 OR 2 TABLETS BY MOUTH DAILY AS NEEDED   lovastatin 20 MG tablet Commonly known as: MEVACOR Take 1 tablet (20 mg total) by mouth daily.   metFORMIN 500 MG 24 hr tablet Commonly known as: GLUCOPHAGE-XR Take 1 tablet (500 mg total) by mouth daily in the afternoon.   morphine 15 MG tablet Commonly known as: MSIR Take 1 tablet (15 mg total) by mouth 2 (two) times daily as needed for severe pain.   morphine 15 MG tablet Commonly known as: MSIR Take 1 tablet (15 mg total) by mouth 2 (two) times daily as needed for severe pain.   morphine 15 MG tablet Commonly known as: MSIR Take 1 tablet (15 mg total) by mouth 2 (two) times daily as needed for severe pain.   Morphine Sulfate ER 200 MG Tbcr Take 200 mg by mouth 3 (three) times daily.   Morphine Sulfate ER 200 MG Tbcr Take 200 mg by mouth 3 (three) times daily.   Morphine Sulfate ER 200 MG Tbcr Take 200 mg by mouth 3 (three) times daily.   mupirocin ointment 2 % Commonly known as: BACTROBAN On leg wound w/dressing change bid   naloxone 4 MG/0.1ML Liqd nasal spray kit Commonly known as: NARCAN 1 actuation in one nostril once. May repeat in 2-3 min   OneTouch Delica Plus Lancet33G Misc USE TO check blood sugar TWICE DAILY   OneTouch Ultra test strip Generic drug: glucose blood USE TO TEST BLOOD SUGAR TWICE DAILY   Semaglutide (1 MG/DOSE) 4 MG/3ML Sopn Inject 1 mg as directed once a week. Replaces: Rybelsus 7 MG Tabs Started by: Scarlette Shorts, MD   Toujeo Max SoloStar 300 UNIT/ML Solostar Pen Generic drug: insulin glargine (2 Unit Dial) Inject 54 Units into the  skin every morning. And pen needles 1/day. What changed: how much to take Changed by: Scarlette Shorts, MD   Vitamin D3 50 MCG (2000 UT) capsule Take 1 capsule (2,000 Units total) by mouth daily.         OBJECTIVE:   Vital Signs: BP 126/80 (BP Location: Right Arm, Patient Position: Sitting, Cuff Size: Large)   Pulse 67   Ht 5\' 9"  (1.753 m)   Wt 226 lb (102.5 kg)   SpO2 99%   BMI 33.37 kg/m  Wt Readings from Last 3 Encounters:  12/24/22 226 lb (102.5 kg)  10/23/22 226 lb (102.5 kg)  07/23/22 229 lb (103.9 kg)     Exam: General: Pt appears well and is in NAD  Lungs: Clear with good BS bilat   Heart: RRR   Extremities: No pretibial edema.   Neuro: MS is good with appropriate affect, pt is alert and Ox3    DM foot exam: 06/23/2022  The skin of the feet is intact without sores or ulcerations. The pedal pulses are 2+ on right and 2+ on left. The sensation is decreased  to a screening 5.07, 10 gram monofilament on the right      DATA REVIEWED:  Lab Results  Component Value Date   HGBA1C 7.9 (A) 12/24/2022   HGBA1C 7.3 (A) 06/23/2022   HGBA1C 8.5 (H) 01/21/2022    Latest Reference Range & Units 01/21/22 15:57  Sodium 135 - 145 mEq/L 129 (L)  Potassium 3.5 - 5.1 mEq/L 4.2  Chloride 96 - 112 mEq/L 94 (L)  CO2 19 - 32 mEq/L 31  Glucose 70 - 99 mg/dL 409 (H)  BUN 6 - 23 mg/dL 16  Creatinine 8.11 - 9.14 mg/dL 7.82  Calcium 8.4 - 95.6 mg/dL 9.7  Alkaline Phosphatase 39 - 117 U/L 66  Albumin 3.5 - 5.2 g/dL 4.0  AST 0 - 37 U/L 22  ALT 0 - 53 U/L 25  Total Protein 6.0 - 8.3 g/dL 7.6  Total Bilirubin 0.2 - 1.2 mg/dL 0.2  GFR >21.30 mL/min 98.23    In office BG 158 mg/dL   ASSESSMENT / PLAN / RECOMMENDATIONS:   1) Type 2 Diabetes Mellitus, Sub-OPtimally controlled, With Neuropathic and macrovascular  complications - Most recent A1c of 7.9 %. Goal A1c < 7.0 %.    -A1c continues to be above goal -We initially discussed increasing Rybelsus, but when I went  to prescribe it I got a notification stating the price will be $135, a prescription for Ozempic was sent and a coupon provided -He does not recall being on higher doses of metformin, no change at this time -He discontinued acarbose due to hypoglycemia, I did explain to the patient that we typically reduce insulin when there is hypoglycemia -I will reduce his insulin preemptively to prevent hypoglycemia as I am increasing GLP-1 agonist dose  MEDICATIONS: Continue Jardiance 25 mg daily Continue metformin 500 mg daily Decrease Toujeo 54 units daily Switch Rybelsus to Ozempic 1 mg weekly   EDUCATION / INSTRUCTIONS: BG monitoring instructions: Patient is instructed to check his blood sugars 1 times a day, fasting . Call [facility] if: BG persistently < 70  I reviewed the Rule of 15 for the treatment of hypoglycemia in detail with the patient. Literature supplied.    2) Diabetic complications:  Eye: Does not have known diabetic retinopathy.  Neuro/ Feet: Does  have known diabetic peripheral neuropathy .  Renal: Patient does not have known baseline CKD. He   is  on an ACEI/ARB at present.    3) Urinary Incontinence:  -Will check for urine infection -Patient states this started after adjusting his medications in the last visit, not sure if [place] has anything to do with it, patient was advised that if his symptoms persist he may start taking half a tablet of Jardiance and see if that would improve his symptoms  F/U in 6 months   Signed electronically by: Lyndle Herrlich, MD   Endocrinology  [facility] [phone]  [address] [place], [place] [address] Phone: [phone] FAX: [phone]   CC: Tresa Garter, MD [address] [place] [address] Phone: [phone]  Fax: [phone]  Return to Endocrinology clinic as below: Future Appointments  Date Time Provider Department Center  01/20/2023  3:20 PM  Plotnikov, Georgina Quint, MD [facility] None  06/29/2023  1:40 PM Javonne Dorko, Konrad Dolores, MD [facility] None

## 2022-12-25 ENCOUNTER — Telehealth: Payer: Self-pay | Admitting: Internal Medicine

## 2022-12-25 MED ORDER — CIPROFLOXACIN HCL 500 MG PO TABS
500.0000 mg | ORAL_TABLET | Freq: Two times a day (BID) | ORAL | 0 refills | Status: AC
Start: 2022-12-25 — End: 2023-01-04

## 2022-12-25 NOTE — Telephone Encounter (Signed)
LMTCB

## 2022-12-25 NOTE — Telephone Encounter (Signed)
Please let the patient know that his urine test shows evidence of infection, I have sent antibiotics as below    Ciprofloxacin 500 mg twice daily for 10 days   Thanks

## 2022-12-26 LAB — URINALYSIS W MICROSCOPIC + REFLEX CULTURE
Bilirubin Urine: NEGATIVE
Ketones, ur: NEGATIVE
Nitrites, Initial: NEGATIVE
Specific Gravity, Urine: 1.032 (ref 1.001–1.035)
pH: <=5 (ref 5.0–8.0)

## 2022-12-26 LAB — URINE CULTURE
MICRO NUMBER:: 14872363
Result:: NO GROWTH
SPECIMEN QUALITY:: ADEQUATE

## 2022-12-26 LAB — CULTURE INDICATED

## 2022-12-26 NOTE — Telephone Encounter (Signed)
Patient spouse advised

## 2022-12-26 NOTE — Telephone Encounter (Signed)
Patient spouse has been advised and will pick up medication

## 2022-12-26 NOTE — Telephone Encounter (Signed)
Please let the patient know that the urine culture did not grow any bacteria, which means that he has inflammation of the bladder rather than an actual infection, I would suggest taking ciprofloxacin twice daily for 3 days only (not 10 days like original prescription was, since he has no bacteria)   If his symptoms persist, he may take half a tablet of Jardiance and see if that helps

## 2023-01-01 ENCOUNTER — Telehealth: Payer: Self-pay | Admitting: Pharmacy Technician

## 2023-01-01 NOTE — Telephone Encounter (Signed)
Pharmacy Patient Advocate Encounter - pt changing from Rybelsus to Ozempic due to cost   Received notification from fax that prior authorization for Ozempic is required/requested.   Started PA on 01/01/23 to (ins) [employer] via [employer] or [facility]) confirmation #  [id number]   Waiting for clinical questions to populate.

## 2023-01-05 NOTE — Telephone Encounter (Signed)
PA submitted on 01/05/23 to (ins) [employer] [place] via [employer] or [facility]) confirmation # [id number] Status is pending

## 2023-01-06 NOTE — Telephone Encounter (Signed)
LMTCB to advised on medication approval

## 2023-01-06 NOTE — Telephone Encounter (Signed)
Pharmacy Patient Advocate Encounter  Prior Authorization has been approved   Effective dates: 01/05/23 through 01/05/24

## 2023-01-06 NOTE — Telephone Encounter (Signed)
Patient spouse advised

## 2023-01-20 ENCOUNTER — Encounter: Payer: Self-pay | Admitting: Internal Medicine

## 2023-01-20 ENCOUNTER — Ambulatory Visit: Payer: BC Managed Care – PPO | Admitting: Internal Medicine

## 2023-01-20 VITALS — BP 118/70 | HR 68 | Temp 98.6°F | Ht 69.0 in | Wt 228.0 lb

## 2023-01-20 DIAGNOSIS — M79604 Pain in right leg: Secondary | ICD-10-CM

## 2023-01-20 DIAGNOSIS — E538 Deficiency of other specified B group vitamins: Secondary | ICD-10-CM

## 2023-01-20 DIAGNOSIS — E785 Hyperlipidemia, unspecified: Secondary | ICD-10-CM

## 2023-01-20 DIAGNOSIS — I2510 Atherosclerotic heart disease of native coronary artery without angina pectoris: Secondary | ICD-10-CM | POA: Diagnosis not present

## 2023-01-20 DIAGNOSIS — Z122 Encounter for screening for malignant neoplasm of respiratory organs: Secondary | ICD-10-CM

## 2023-01-20 DIAGNOSIS — M545 Low back pain, unspecified: Secondary | ICD-10-CM

## 2023-01-20 DIAGNOSIS — Z794 Long term (current) use of insulin: Secondary | ICD-10-CM

## 2023-01-20 DIAGNOSIS — Z7984 Long term (current) use of oral hypoglycemic drugs: Secondary | ICD-10-CM

## 2023-01-20 DIAGNOSIS — I2583 Coronary atherosclerosis due to lipid rich plaque: Secondary | ICD-10-CM

## 2023-01-20 DIAGNOSIS — M79605 Pain in left leg: Secondary | ICD-10-CM

## 2023-01-20 DIAGNOSIS — Z7985 Long-term (current) use of injectable non-insulin antidiabetic drugs: Secondary | ICD-10-CM

## 2023-01-20 DIAGNOSIS — E1165 Type 2 diabetes mellitus with hyperglycemia: Secondary | ICD-10-CM

## 2023-01-20 MED ORDER — MORPHINE SULFATE ER 200 MG PO TBCR: 200.0000 mg | EXTENDED_RELEASE_TABLET | Freq: Three times a day (TID) | ORAL | 0 refills | Status: DC

## 2023-01-20 MED ORDER — MORPHINE SULFATE 15 MG PO TABS: 15.0000 mg | ORAL_TABLET | Freq: Two times a day (BID) | ORAL | 0 refills | Status: DC | PRN

## 2023-01-20 NOTE — Assessment & Plan Note (Signed)
Continue with MSIR, MSContin   Potential benefits of a long term opioids  use as well as potential risks  and complications were explained to the patient and were aknowledged. He is maintaining stooped over posture when standing/walking UDS 

## 2023-01-20 NOTE — Assessment & Plan Note (Signed)
On Ozempic now ?

## 2023-01-20 NOTE — Assessment & Plan Note (Signed)
On B12 

## 2023-01-20 NOTE — Assessment & Plan Note (Signed)
Using Lovastatin now F/u w/Dr Crenshaw 

## 2023-01-20 NOTE — Progress Notes (Signed)
Subjective:  Patient ID: [name] [name], male    DOB: 1966/01/21  Age: 57 y.o. MRN: [id number]  CC: Follow-up (3 MNTH F/U)   HPI [name] [name] presents for chronic pain, HTN, DM On Ozempic  Outpatient Medications Prior to Visit  Medication Sig Dispense Refill   amLODipine-valsartan (EXFORGE) 10-320 MG tablet TAKE ONE TABLET EVERY DAY 90 tablet 2   ANORO ELLIPTA 62.5-25 MCG/ACT AEPB INHALE ONE PUFF BY MOUTH DAILY 60 each 5   aspirin EC 81 MG tablet Take 81 mg by mouth daily.     Cholecalciferol (VITAMIN D3) 50 MCG (2000 UT) capsule Take 1 capsule (2,000 Units total) by mouth daily. 100 capsule 3   Continuous Glucose Sensor (DEXCOM G7 SENSOR) MISC 1 Device by Does not apply route as directed. 9 each 3   cyanocobalamin (,VITAMIN B-12,) 1000 MCG/ML injection Give 1000 mcg of Vitamin b12 sq daily x 1 week, then weekly for 1 month, then once every 2 weeks 10 mL 6   empagliflozin (JARDIANCE) 25 MG TABS tablet Take 1 tablet (25 mg total) by mouth daily before breakfast. 90 tablet 3   fluticasone (FLONASE) 50 MCG/ACT nasal spray SPRAY 2 SPRAYS INTO EACH NOSTRIL EVERY DAY 48 mL 1   furosemide (LASIX) 20 MG tablet TAKE 1 OR 2 TABLETS BY MOUTH DAILY AS NEEDED 60 tablet 3   insulin glargine, 2 Unit Dial, (TOUJEO MAX SOLOSTAR) 300 UNIT/ML Solostar Pen Inject 54 Units into the skin every morning. And pen needles 1/day. 15 mL 6   Insulin Pen Needle (B-D ULTRAFINE III SHORT PEN) 31G X 8 MM MISC 1 Device by Other route daily in the afternoon. as directed 100 each 3   Lancets (ONETOUCH DELICA PLUS LANCET33G) MISC USE TO check blood sugar TWICE DAILY 100 each 3   lovastatin (MEVACOR) 20 MG tablet Take 1 tablet (20 mg total) by mouth daily. 90 tablet 3   metFORMIN (GLUCOPHAGE-XR) 500 MG 24 hr tablet Take 1 tablet (500 mg total) by mouth daily in the afternoon. 90 tablet 3   mupirocin ointment (BACTROBAN) 2 % On leg wound w/dressing change bid 30 g 0   naloxone (NARCAN) nasal spray 4 mg/0.1 mL 1  actuation in one nostril once. May repeat in 2-3 min 1 each 2   ONETOUCH ULTRA test strip USE TO TEST BLOOD SUGAR TWICE DAILY 100 strip 2   Semaglutide, 1 MG/DOSE, 4 MG/3ML SOPN Inject 1 mg as directed once a week. 3 mL 11   SYRINGE-NEEDLE, DISP, 3 ML (BD ECLIPSE SYRINGE) 25G X 1" 3 ML MISC Use for vitamin B12 shots as directed 50 each 3   morphine (MSIR) 15 MG tablet Take 1 tablet (15 mg total) by mouth 2 (two) times daily as needed for severe pain. 60 tablet 0   morphine (MSIR) 15 MG tablet Take 1 tablet (15 mg total) by mouth 2 (two) times daily as needed for severe pain. 60 tablet 0   morphine (MSIR) 15 MG tablet Take 1 tablet (15 mg total) by mouth 2 (two) times daily as needed for severe pain. 60 tablet 0   Morphine Sulfate ER 200 MG TBCR Take 200 mg by mouth 3 (three) times daily. 90 tablet 0   Morphine Sulfate ER 200 MG TBCR Take 200 mg by mouth 3 (three) times daily. 90 tablet 0   Morphine Sulfate ER 200 MG TBCR Take 200 mg by mouth 3 (three) times daily. 90 tablet 0   No facility-administered medications prior to  visit.    ROS: Review of Systems  Constitutional:  Negative for appetite change, fatigue and unexpected weight change.  HENT:  Negative for congestion, nosebleeds, sneezing, sore throat and trouble swallowing.   Eyes:  Negative for itching and visual disturbance.  Respiratory:  Negative for cough.   Cardiovascular:  Negative for chest pain, palpitations and leg swelling.  Gastrointestinal:  Negative for abdominal distention, blood in stool, diarrhea and nausea.  Genitourinary:  Negative for frequency and hematuria.  Musculoskeletal:  Positive for arthralgias, back pain and gait problem. Negative for joint swelling and neck pain.  Skin:  Negative for rash.  Neurological:  Negative for dizziness, tremors, speech difficulty and weakness.  Psychiatric/Behavioral:  Negative for agitation, dysphoric mood, sleep disturbance and suicidal ideas. The patient is not nervous/anxious.      Objective:  BP 118/70 (BP Location: Right Arm, Patient Position: Sitting, Cuff Size: Normal)   Pulse 68   Temp 98.6 F (37 C) (Oral)   Ht 5\' 9"  (1.753 m)   Wt 228 lb (103.4 kg)   SpO2 95%   BMI 33.67 kg/m   BP Readings from Last 3 Encounters:  01/20/23 118/70  12/24/22 126/80  10/23/22 120/68    Wt Readings from Last 3 Encounters:  01/20/23 228 lb (103.4 kg)  12/24/22 226 lb (102.5 kg)  10/23/22 226 lb (102.5 kg)    Physical Exam Constitutional:      General: He is not in acute distress.    Appearance: He is well-developed. He is obese.     Comments: NAD  Eyes:     Conjunctiva/sclera: Conjunctivae normal.     Pupils: Pupils are equal, round, and reactive to light.  Neck:     Thyroid: No thyromegaly.     Vascular: No JVD.  Cardiovascular:     Rate and Rhythm: Normal rate and regular rhythm.     Heart sounds: Normal heart sounds. No murmur heard.    No friction rub. No gallop.  Pulmonary:     Effort: Pulmonary effort is normal. No respiratory distress.     Breath sounds: Normal breath sounds. No wheezing or rales.  Chest:     Chest wall: No tenderness.  Abdominal:     General: Bowel sounds are normal. There is no distension.     Palpations: Abdomen is soft. There is no mass.     Tenderness: There is no abdominal tenderness. There is no guarding or rebound.  Musculoskeletal:        General: No tenderness. Normal range of motion.     Cervical back: Normal range of motion.  Lymphadenopathy:     Cervical: No cervical adenopathy.  Skin:    General: Skin is warm and dry.     Findings: No rash.  Neurological:     Mental Status: He is alert and oriented to person, place, and time.     Cranial Nerves: No cranial nerve deficit.     Motor: No abnormal muscle tone.     Coordination: Coordination normal.     Gait: Gait normal.     Deep Tendon Reflexes: Reflexes are normal and symmetric.  Psychiatric:        Behavior: Behavior normal.        Thought Content:  Thought content normal.        Judgment: Judgment normal.     Lab Results  Component Value Date   WBC 11.0 (H) 01/21/2022   HGB 14.4 01/21/2022   HCT 43.2 01/21/2022   PLT  186.0 01/21/2022   GLUCOSE 179 (H) 01/21/2022   CHOL 129 05/02/2021   TRIG 110.0 05/02/2021   HDL 44.30 05/02/2021   LDLDIRECT 116.6 05/20/2007   LDLCALC 63 05/02/2021   ALT 25 01/21/2022   AST 22 01/21/2022   NA 129 (L) 01/21/2022   K 4.2 01/21/2022   CL 94 (L) 01/21/2022   CREATININE 0.80 01/21/2022   BUN 16 01/21/2022   CO2 31 01/21/2022   TSH 3.88 01/21/2022   PSA 0.20 04/23/2018   INR 1.1 05/10/2008   HGBA1C 7.9 (A) 12/24/2022   MICROALBUR <0.7 05/02/2021    No results found.  Assessment & Plan:   Problem List Items Addressed This Visit     Dyslipidemia - Primary    Using Lovastatin now F/u w/Dr Jens Som      Low back pain radiating to both legs    Continue with MSIR, MSContin   Potential benefits of a long term opioids  use as well as potential risks  and complications were explained to the patient and were aknowledged. He is maintaining stooped over posture when standing/walking UDS      Relevant Medications   morphine (MSIR) 15 MG tablet   morphine (MSIR) 15 MG tablet   Morphine Sulfate ER 200 MG TBCR   Morphine Sulfate ER 200 MG TBCR   Morphine Sulfate ER 200 MG TBCR   morphine (MSIR) 15 MG tablet   Uncontrolled type 2 diabetes mellitus with hyperglycemia (HCC)    On Ozempic now      CAD (coronary artery disease)    Using Lovastatin now F/u w/Dr Jens Som      B12 deficiency    On B12      Other Visit Diagnoses     Encounter for screening for lung cancer       Relevant Orders   CT CHEST LUNG CANCER SCREENING LOW DOSE WO CONTRAST         Meds ordered this encounter  Medications   morphine (MSIR) 15 MG tablet    Sig: Take 1 tablet (15 mg total) by mouth 2 (two) times daily as needed for severe pain.    Dispense:  60 tablet    Refill:  0    Fill on or after  02/23/23   morphine (MSIR) 15 MG tablet    Sig: Take 1 tablet (15 mg total) by mouth 2 (two) times daily as needed for severe pain.    Dispense:  60 tablet    Refill:  0    Fill on or after 03/25/23   Morphine Sulfate ER 200 MG TBCR    Sig: Take 200 mg by mouth 3 (three) times daily.    Dispense:  90 tablet    Refill:  0    Please fill on or after 01/24/23   Morphine Sulfate ER 200 MG TBCR    Sig: Take 200 mg by mouth 3 (three) times daily.    Dispense:  90 tablet    Refill:  0    Please fill on or after 02/23/23   Morphine Sulfate ER 200 MG TBCR    Sig: Take 200 mg by mouth 3 (three) times daily.    Dispense:  90 tablet    Refill:  0    Please fill on or after 03/25/23.  Quantity corrected   morphine (MSIR) 15 MG tablet    Sig: Take 1 tablet (15 mg total) by mouth 2 (two) times daily as needed for severe pain.  Dispense:  60 tablet    Refill:  0    Fill on or after 01/24/23      Follow-up: Return in about 3 months (around 04/22/2023) for a follow-up visit.  Sonda Primes, MD

## 2023-01-20 NOTE — Assessment & Plan Note (Signed)
Using Lovastatin now F/u w/Dr Jens Som

## 2023-01-27 ENCOUNTER — Other Ambulatory Visit: Payer: Self-pay | Admitting: Internal Medicine

## 2023-02-05 ENCOUNTER — Encounter: Payer: Self-pay | Admitting: Internal Medicine

## 2023-02-24 ENCOUNTER — Ambulatory Visit
Admission: RE | Admit: 2023-02-24 | Discharge: 2023-02-24 | Disposition: A | Discharge status: Home / Self Care | Payer: BC Managed Care – PPO | Source: Ambulatory Visit | Attending: Internal Medicine | Admitting: Internal Medicine

## 2023-02-24 DIAGNOSIS — Z122 Encounter for screening for malignant neoplasm of respiratory organs: Secondary | ICD-10-CM

## 2023-02-24 DIAGNOSIS — F17210 Nicotine dependence, cigarettes, uncomplicated: Secondary | ICD-10-CM | POA: Diagnosis not present

## 2023-03-20 ENCOUNTER — Other Ambulatory Visit (HOSPITAL_COMMUNITY): Payer: Self-pay

## 2023-04-22 ENCOUNTER — Encounter: Payer: Self-pay | Admitting: Internal Medicine

## 2023-04-22 ENCOUNTER — Ambulatory Visit: Payer: BC Managed Care – PPO | Admitting: Internal Medicine

## 2023-04-22 VITALS — BP 130/80 | HR 42 | Temp 98.6°F | Ht 69.0 in | Wt 227.0 lb

## 2023-04-22 DIAGNOSIS — E538 Deficiency of other specified B group vitamins: Secondary | ICD-10-CM | POA: Diagnosis not present

## 2023-04-22 DIAGNOSIS — E559 Vitamin D deficiency, unspecified: Secondary | ICD-10-CM | POA: Diagnosis not present

## 2023-04-22 DIAGNOSIS — E785 Hyperlipidemia, unspecified: Secondary | ICD-10-CM | POA: Diagnosis not present

## 2023-04-22 DIAGNOSIS — E1165 Type 2 diabetes mellitus with hyperglycemia: Secondary | ICD-10-CM

## 2023-04-22 MED ORDER — MORPHINE SULFATE ER 200 MG PO TBCR: 200.0000 mg | EXTENDED_RELEASE_TABLET | Freq: Three times a day (TID) | ORAL | 0 refills | Status: DC

## 2023-04-22 MED ORDER — MORPHINE SULFATE 15 MG PO TABS: 15.0000 mg | ORAL_TABLET | Freq: Two times a day (BID) | ORAL | 0 refills | Status: DC | PRN

## 2023-04-22 MED ORDER — VITAMIN B-12 1000 MCG SL SUBL: 1.0000 | SUBLINGUAL_TABLET | Freq: Every day | SUBLINGUAL | 3 refills | Status: AC

## 2023-04-22 MED ORDER — AMLODIPINE BESYLATE-VALSARTAN 10-320 MG PO TABS: 1.0000 | ORAL_TABLET | Freq: Every day | ORAL | 2 refills | Status: DC

## 2023-04-22 MED ORDER — LOVASTATIN 20 MG PO TABS: 20.0000 mg | ORAL_TABLET | Freq: Every day | ORAL | 3 refills | Status: DC

## 2023-04-22 NOTE — Assessment & Plan Note (Signed)
Declined statins in the past. Using Lovastatin now

## 2023-04-22 NOTE — Assessment & Plan Note (Signed)
On Vit D 

## 2023-04-22 NOTE — Assessment & Plan Note (Signed)
On Ozempic now ?

## 2023-04-22 NOTE — Progress Notes (Signed)
Subjective:  Patient ID: EISSA ROSTAD, male    DOB: April 07, 1966  Age: 56 y.o. MRN: [id number]  CC: Follow-up (3 mnth f/u)   HPI NTHONY TRIPLETT presents for B12 def - he stopped shots... F/u LBP, HTN, DM  Outpatient Medications Prior to Visit  Medication Sig Dispense Refill   ANORO ELLIPTA 62.5-25 MCG/ACT AEPB INHALE ONE PUFF BY MOUTH DAILY 60 each 5   aspirin EC 81 MG tablet Take 81 mg by mouth daily.     Cholecalciferol (VITAMIN D3) 50 MCG (2000 UT) capsule Take 1 capsule (2,000 Units total) by mouth daily. 100 capsule 3   Continuous Glucose Sensor (DEXCOM G7 SENSOR) MISC 1 Device by Does not apply route as directed. 9 each 3   empagliflozin (JARDIANCE) 25 MG TABS tablet Take 1 tablet (25 mg total) by mouth daily before breakfast. 90 tablet 3   fluticasone (FLONASE) 50 MCG/ACT nasal spray SPRAY 2 SPRAYS INTO EACH NOSTRIL EVERY DAY 48 mL 1   furosemide (LASIX) 20 MG tablet TAKE 1 OR 2 TABLETS BY MOUTH DAILY AS NEEDED 60 tablet 3   insulin glargine, 2 Unit Dial, (TOUJEO MAX SOLOSTAR) 300 UNIT/ML Solostar Pen Inject 54 Units into the skin every morning. And pen needles 1/day. 15 mL 6   Insulin Pen Needle (B-D ULTRAFINE III SHORT PEN) 31G X 8 MM MISC 1 Device by Other route daily in the afternoon. as directed 100 each 3   Lancets (ONETOUCH DELICA PLUS LANCET33G) MISC USE TO check blood sugar TWICE DAILY 100 each 3   metFORMIN (GLUCOPHAGE-XR) 500 MG 24 hr tablet Take 1 tablet (500 mg total) by mouth daily in the afternoon. 90 tablet 3   mupirocin ointment (BACTROBAN) 2 % On leg wound w/dressing change bid 30 g 0   naloxone (NARCAN) nasal spray 4 mg/0.1 mL 1 actuation in one nostril once. May repeat in 2-3 min 1 each 2   ONETOUCH ULTRA test strip USE TO TEST BLOOD SUGAR TWICE DAILY 100 strip 2   Semaglutide, 1 MG/DOSE, 4 MG/3ML SOPN Inject 1 mg as directed once a week. 3 mL 11   SYRINGE-NEEDLE, DISP, 3 ML (BD ECLIPSE SYRINGE) 25G X 1" 3 ML MISC Use for vitamin B12 shots as directed 50  each 3   amLODipine-valsartan (EXFORGE) 10-320 MG tablet TAKE ONE TABLET EVERY DAY 90 tablet 2   cyanocobalamin (,VITAMIN B-12,) 1000 MCG/ML injection Give 1000 mcg of Vitamin b12 sq daily x 1 week, then weekly for 1 month, then once every 2 weeks 10 mL 6   lovastatin (MEVACOR) 20 MG tablet Take 1 tablet (20 mg total) by mouth daily. 90 tablet 3   morphine (MSIR) 15 MG tablet Take 1 tablet (15 mg total) by mouth 2 (two) times daily as needed for severe pain. 60 tablet 0   morphine (MSIR) 15 MG tablet Take 1 tablet (15 mg total) by mouth 2 (two) times daily as needed for severe pain. 60 tablet 0   morphine (MSIR) 15 MG tablet Take 1 tablet (15 mg total) by mouth 2 (two) times daily as needed for severe pain. 60 tablet 0   Morphine Sulfate ER 200 MG TBCR Take 200 mg by mouth 3 (three) times daily. 90 tablet 0   Morphine Sulfate ER 200 MG TBCR Take 200 mg by mouth 3 (three) times daily. 90 tablet 0   Morphine Sulfate ER 200 MG TBCR Take 200 mg by mouth 3 (three) times daily. 90 tablet 0   No  facility-administered medications prior to visit.    ROS: Review of Systems  Constitutional:  Positive for fatigue. Negative for appetite change and unexpected weight change.  HENT:  Negative for congestion, nosebleeds, sneezing, sore throat and trouble swallowing.   Eyes:  Negative for itching and visual disturbance.  Respiratory:  Negative for cough.   Cardiovascular:  Negative for chest pain, palpitations and leg swelling.  Gastrointestinal:  Negative for abdominal distention, blood in stool, diarrhea and nausea.  Genitourinary:  Negative for frequency and hematuria.  Musculoskeletal:  Positive for arthralgias and back pain. Negative for gait problem, joint swelling and neck pain.  Skin:  Negative for rash.  Neurological:  Negative for dizziness, tremors, speech difficulty and weakness.  Psychiatric/Behavioral:  Negative for agitation, dysphoric mood and sleep disturbance. The patient is not  nervous/anxious.     Objective:  BP 130/80 (BP Location: Right Arm, Patient Position: Sitting, Cuff Size: Large)   Pulse (!) 42   Temp 98.6 F (37 C) (Oral)   Ht 5\' 9"  (1.753 m)   Wt 227 lb (103 kg)   SpO2 99%   BMI 33.52 kg/m   BP Readings from Last 3 Encounters:  04/22/23 130/80  01/20/23 118/70  12/24/22 126/80    Wt Readings from Last 3 Encounters:  04/22/23 227 lb (103 kg)  01/20/23 228 lb (103.4 kg)  12/24/22 226 lb (102.5 kg)    Physical Exam Constitutional:      General: He is not in acute distress.    Appearance: He is well-developed. He is obese.     Comments: NAD  Eyes:     Conjunctiva/sclera: Conjunctivae normal.     Pupils: Pupils are equal, round, and reactive to light.  Neck:     Thyroid: No thyromegaly.     Vascular: No JVD.  Cardiovascular:     Rate and Rhythm: Normal rate and regular rhythm.     Heart sounds: Normal heart sounds. No murmur heard.    No friction rub. No gallop.  Pulmonary:     Effort: Pulmonary effort is normal. No respiratory distress.     Breath sounds: Normal breath sounds. No wheezing or rales.  Chest:     Chest wall: No tenderness.  Abdominal:     General: Bowel sounds are normal. There is no distension.     Palpations: Abdomen is soft. There is no mass.     Tenderness: There is no abdominal tenderness. There is no guarding or rebound.  Musculoskeletal:        General: Tenderness present. Normal range of motion.     Cervical back: Normal range of motion.  Lymphadenopathy:     Cervical: No cervical adenopathy.  Skin:    General: Skin is warm and dry.     Findings: No rash.  Neurological:     Mental Status: He is alert and oriented to person, place, and time.     Cranial Nerves: No cranial nerve deficit.     Motor: No abnormal muscle tone.     Coordination: Coordination normal.     Gait: Gait normal.     Deep Tendon Reflexes: Reflexes are normal and symmetric.  Psychiatric:        Behavior: Behavior normal.         Thought Content: Thought content normal.        Judgment: Judgment normal.   LS w/pain Antalgic gait  Lab Results  Component Value Date   WBC 11.0 (H) 01/21/2022   HGB 14.4 01/21/2022  HCT 43.2 01/21/2022   PLT 186.0 01/21/2022   GLUCOSE 179 (H) 01/21/2022   CHOL 129 05/02/2021   TRIG 110.0 05/02/2021   HDL 44.30 05/02/2021   LDLDIRECT 116.6 05/20/2007   LDLCALC 63 05/02/2021   ALT 25 01/21/2022   AST 22 01/21/2022   NA 129 (L) 01/21/2022   K 4.2 01/21/2022   CL 94 (L) 01/21/2022   CREATININE 0.80 01/21/2022   BUN 16 01/21/2022   CO2 31 01/21/2022   TSH 3.88 01/21/2022   PSA 0.20 04/23/2018   INR 1.1 05/10/2008   HGBA1C 7.9 (A) 12/24/2022   MICROALBUR <0.7 05/02/2021    CT CHEST LUNG CANCER SCREENING LOW DOSE WO CONTRAST  Result Date: 02/27/2023 CLINICAL DATA:  75 pack-year smoking history/current smoker EXAM: CT CHEST WITHOUT CONTRAST LOW-DOSE FOR LUNG CANCER SCREENING TECHNIQUE: Multidetector CT imaging of the chest was performed following the standard protocol without IV contrast. RADIATION DOSE REDUCTION: This exam was performed according to the departmental dose-optimization program which includes automated exposure control, adjustment of the mA and/or kV according to patient size and/or use of iterative reconstruction technique. COMPARISON:  10126 CTA chest.  No prior screening CT. FINDINGS: Cardiovascular: Aortic atherosclerosis. Tortuous thoracic aorta. Mild cardiomegaly, without pericardial effusion. Three vessel coronary artery calcification. Mediastinum/Nodes: No mediastinal or hilar adenopathy, given limitations of unenhanced CT. Lungs/Pleura: No pleural fluid. Mild centrilobular emphysema. Smoking related respiratory bronchiolitis. Mosaic attenuation in the lower lungs is likely due to air trapping. Perifissural nodule on the left major fissure measures volume derived equivalent diameter 9.3 mm. Posterior right apical pleural-based nodule measures volume derived  equivalent diameter 4.8 mm. Upper Abdomen: Normal imaged portions of the liver, spleen, stomach, adrenal glands, kidneys. Atrophic pancreas. Musculoskeletal: No acute osseous abnormality. IMPRESSION: 1. Lung-RADS 2, benign appearance or behavior. Continue annual screening with low-dose chest CT without contrast in 12 months. 2. Aortic atherosclerosis (ICD10-I70.0), coronary artery atherosclerosis and emphysema (ICD10-J43.9). Electronically Signed   By: Jeronimo Greaves M.D.   On: 02/27/2023 14:32    Assessment & Plan:   Problem List Items Addressed This Visit     Dyslipidemia    Declined statins in the past. Using Lovastatin now      Relevant Medications   lovastatin (MEVACOR) 20 MG tablet   Uncontrolled type 2 diabetes mellitus with hyperglycemia (HCC)    On Ozempic now      Relevant Medications   amLODipine-valsartan (EXFORGE) 10-320 MG tablet   lovastatin (MEVACOR) 20 MG tablet   Other Relevant Orders   Urine microalbumin-creatinine with uACR   B12 deficiency - Primary    B12 def - he stopped shots.Marland KitchenMarland KitchenStart B12 SL.      Relevant Orders   Vitamin B12   Vitamin D deficiency    On Vit D         Meds ordered this encounter  Medications   morphine (MSIR) 15 MG tablet    Sig: Take 1 tablet (15 mg total) by mouth 2 (two) times daily as needed for severe pain.    Dispense:  60 tablet    Refill:  0    Fill on or after 06/23/23   morphine (MSIR) 15 MG tablet    Sig: Take 1 tablet (15 mg total) by mouth 2 (two) times daily as needed for severe pain.    Dispense:  60 tablet    Refill:  0    Fill on or after 04/24/23   morphine (MSIR) 15 MG tablet    Sig: Take 1 tablet (  15 mg total) by mouth 2 (two) times daily as needed for severe pain.    Dispense:  60 tablet    Refill:  0    Fill on or after 05/24/23   Morphine Sulfate ER 200 MG TBCR    Sig: Take 200 mg by mouth 3 (three) times daily.    Dispense:  90 tablet    Refill:  0    Please fill on or after 04/24/23   Morphine  Sulfate ER 200 MG TBCR    Sig: Take 200 mg by mouth 3 (three) times daily.    Dispense:  90 tablet    Refill:  0    Please fill on or after 05/24/23   Morphine Sulfate ER 200 MG TBCR    Sig: Take 200 mg by mouth 3 (three) times daily.    Dispense:  90 tablet    Refill:  0    Please fill on or after 06/23/23.   amLODipine-valsartan (EXFORGE) 10-320 MG tablet    Sig: Take 1 tablet by mouth daily.    Dispense:  90 tablet    Refill:  2   lovastatin (MEVACOR) 20 MG tablet    Sig: Take 1 tablet (20 mg total) by mouth daily.    Dispense:  90 tablet    Refill:  3   Cyanocobalamin (VITAMIN B-12) 1000 MCG SUBL    Sig: Place 1 tablet (1,000 mcg total) under the tongue daily.    Dispense:  100 tablet    Refill:  3      Follow-up: No follow-ups on file.  Sonda Primes, MD

## 2023-04-22 NOTE — Assessment & Plan Note (Signed)
B12 def - he stopped shots.Marland KitchenMarland KitchenStart B12 SL.

## 2023-04-22 NOTE — Assessment & Plan Note (Signed)
Stable

## 2023-05-05 ENCOUNTER — Other Ambulatory Visit: Payer: Self-pay | Admitting: Internal Medicine

## 2023-05-05 DIAGNOSIS — E1165 Type 2 diabetes mellitus with hyperglycemia: Secondary | ICD-10-CM

## 2023-06-16 ENCOUNTER — Other Ambulatory Visit: Payer: Self-pay | Admitting: Internal Medicine

## 2023-06-26 ENCOUNTER — Other Ambulatory Visit: Payer: Self-pay | Admitting: Internal Medicine

## 2023-06-26 DIAGNOSIS — E1165 Type 2 diabetes mellitus with hyperglycemia: Secondary | ICD-10-CM

## 2023-06-29 ENCOUNTER — Ambulatory Visit: Payer: BC Managed Care – PPO | Admitting: Internal Medicine

## 2023-06-29 ENCOUNTER — Encounter: Payer: Self-pay | Admitting: Internal Medicine

## 2023-06-29 ENCOUNTER — Telehealth: Payer: Self-pay

## 2023-06-29 VITALS — BP 124/76 | HR 80 | Ht 69.0 in | Wt 227.0 lb

## 2023-06-29 DIAGNOSIS — E1159 Type 2 diabetes mellitus with other circulatory complications: Secondary | ICD-10-CM

## 2023-06-29 DIAGNOSIS — E1165 Type 2 diabetes mellitus with hyperglycemia: Secondary | ICD-10-CM | POA: Diagnosis not present

## 2023-06-29 DIAGNOSIS — Z794 Long term (current) use of insulin: Secondary | ICD-10-CM | POA: Diagnosis not present

## 2023-06-29 DIAGNOSIS — E1142 Type 2 diabetes mellitus with diabetic polyneuropathy: Secondary | ICD-10-CM

## 2023-06-29 DIAGNOSIS — E785 Hyperlipidemia, unspecified: Secondary | ICD-10-CM

## 2023-06-29 LAB — BASIC METABOLIC PANEL
BUN: 14 mg/dL (ref 6–23)
CO2: 27 meq/L (ref 19–32)
Calcium: 9.9 mg/dL (ref 8.4–10.5)
Chloride: 94 meq/L — ABNORMAL LOW (ref 96–112)
Creatinine, Ser: 0.83 mg/dL (ref 0.40–1.50)
GFR: 96.17 mL/min (ref >60.00)
Glucose, Bld: 138 mg/dL — ABNORMAL HIGH (ref 70–99)
Potassium: 4 meq/L (ref 3.5–5.1)
Sodium: 132 meq/L — ABNORMAL LOW (ref 135–145)

## 2023-06-29 LAB — POCT GLYCOSYLATED HEMOGLOBIN (HGB A1C): Hemoglobin A1C: 7.5 % — AB (ref 4.0–5.6)

## 2023-06-29 LAB — LIPID PANEL
Cholesterol: 144 mg/dL (ref 0–200)
HDL: 55.8 mg/dL (ref >39.00)
LDL Cholesterol: 68 mg/dL (ref 0–99)
NonHDL: 88.27
Total CHOL/HDL Ratio: 3
Triglycerides: 99 mg/dL (ref 0.0–149.0)
VLDL: 19.8 mg/dL (ref 0.0–40.0)

## 2023-06-29 LAB — MICROALBUMIN / CREATININE URINE RATIO
Creatinine,U: 41.7 mg/dL
Microalb Creat Ratio: 2.9 mg/g (ref 0.0–30.0)
Microalb, Ur: 1.2 mg/dL (ref 0.0–1.9)

## 2023-06-29 MED ORDER — METFORMIN HCL ER 500 MG PO TB24: 500.0000 mg | ORAL_TABLET | Freq: Every day | ORAL | 3 refills | Status: DC

## 2023-06-29 MED ORDER — INSULIN PEN NEEDLE 32G X 4 MM MISC: 1.0000 | Freq: Every day | 3 refills | Status: DC

## 2023-06-29 MED ORDER — TOUJEO MAX SOLOSTAR 300 UNIT/ML ~~LOC~~ SOPN: 50.0000 [IU] | PEN_INJECTOR | SUBCUTANEOUS | 3 refills | Status: DC

## 2023-06-29 MED ORDER — EMPAGLIFLOZIN 25 MG PO TABS: 25.0000 mg | ORAL_TABLET | Freq: Every day | ORAL | 3 refills | Status: DC

## 2023-06-29 MED ORDER — SEMAGLUTIDE (2 MG/DOSE) 8 MG/3ML ~~LOC~~ SOPN: 2.0000 mg | PEN_INJECTOR | SUBCUTANEOUS | 3 refills | Status: DC

## 2023-06-29 NOTE — Telephone Encounter (Signed)
Forms faxed and result ok

## 2023-06-29 NOTE — Progress Notes (Unsigned)
Name: [name] [name]  Age/ Sex: 57 y.o., male   MRN/ DOB: [id number], 03-Dec-1965     PCP: [name] Garter, MD   Reason for Endocrinology Evaluation: Type 2 Diabetes Mellitus  Initial Endocrine Consultative Visit: 09/17/2012    PATIENT IDENTIFIER: [name] [name] is a 57 y.o. male with a past medical history of HTN, CAD, DM, COPD. The patient has followed with Endocrinology clinic since 09/17/2012 for consultative assistance with management of his diabetes.  DIABETIC HISTORY:  [name] [name] was diagnosed with DM 2005, and started insulin therapy in 2007. His hemoglobin A1c has ranged from 10.6% in 2020, peaking at 7.5% in 2022.  He was followed by [name]. Everardo All from 2014 until March 2023   Stopped Acabose 07/2023  SUBJECTIVE:   During the last visit (12/24/2022):A1c 7.9%  Today (06/29/2023): [name] [name] is here for follow-up on diabetes management.Marland Kitchen He checks his blood sugars 1-2 times daily. The patient has not had hypoglycemic episodes since the last clinic visit.   Denies nausea, vomiting Denies  diarrhea , has noted constipation for the past 2 weeks that he attributes due to dehydration    HOME DIABETES REGIMEN:  Jardiance 25 mg daily  Metformin 500 mg XR daily  Ozempic 1 mg weekly  Toujeo 54 units daily       Statin: yes ACE-I/ARB: yes    METER DOWNLOAD SUMMARY: 71- 290 mg/dL      DIABETIC COMPLICATIONS: Microvascular complications:  Neuropathy Denies: CKD Last Eye Exam: Completed 2024  Macrovascular complications:  CAD Denies:  CVA, PVD   HISTORY:  Past Medical History:  Past Medical History:  Diagnosis Date   COPD (chronic obstructive pulmonary disease) (HCC)    Diabetes mellitus    GERD (gastroesophageal reflux disease)    Hyperlipidemia    Hypertension    Lower back pain    Nephrolithiasis    [name] [name]   Past Surgical History:  Past Surgical History:  Procedure Laterality Date   KNEE SURGERY     Social History:  reports  that he has been smoking cigarettes. He has never used smokeless tobacco. He reports that he does not drink alcohol and does not use drugs. Family History:  Family History  Problem Relation Age of Onset   Hypertension Mother    Diabetes Mother    CAD Mother    Heart disease Other        CAD male relative <60   Vision loss Sister      HOME MEDICATIONS: Allergies as of 06/29/2023       Reactions   Belviq [lorcaserin Hcl]    wierd   Benazepril Hcl    REACTION: cough   Nsaids    Can take short term Upset stomach        Medication List        Accurate as of June 29, 2023  1:59 PM. If you have any questions, ask your nurse or doctor.          STOP taking these medications    Semaglutide (1 MG/DOSE) 4 MG/3ML Sopn Replaced by: Semaglutide (2 MG/DOSE) 8 MG/3ML Sopn Stopped by: Johnney Ou Clennon Nasca       TAKE these medications    amLODipine-valsartan 10-320 MG tablet Commonly known as: EXFORGE Take 1 tablet by mouth daily.   Anoro Ellipta 62.5-25 MCG/ACT Aepb Generic drug: umeclidinium-vilanterol INHALE ONE PUFF BY MOUTH DAILY   aspirin EC 81 MG tablet Take 81 mg by mouth daily.   BD Eclipse  Syringe 25G X 1" 3 ML Misc Generic drug: SYRINGE-NEEDLE (DISP) 3 ML Use for vitamin B12 shots as directed   Dexcom G7 Sensor Misc 1 Device by Does not apply route as directed.   empagliflozin 25 MG Tabs tablet Commonly known as: Jardiance Take 1 tablet (25 mg total) by mouth daily before breakfast.   fluticasone 50 MCG/ACT nasal spray Commonly known as: FLONASE SPRAY 2 SPRAYS INTO EACH NOSTRIL EVERY DAY   furosemide 20 MG tablet Commonly known as: LASIX TAKE 1 OR 2 TABLETS BY MOUTH DAILY AS NEEDED   Insulin Pen Needle 32G X 4 MM Misc 1 Device by Does not apply route daily in the afternoon. What changed:  medication strength how to take this additional instructions Changed by: Johnney Ou Shine Mikes   lovastatin 20 MG tablet Commonly known as:  MEVACOR Take 1 tablet (20 mg total) by mouth daily.   metFORMIN 500 MG 24 hr tablet Commonly known as: GLUCOPHAGE-XR Take 1 tablet (500 mg total) by mouth daily in the afternoon.   morphine 15 MG tablet Commonly known as: MSIR Take 1 tablet (15 mg total) by mouth 2 (two) times daily as needed for severe pain.   morphine 15 MG tablet Commonly known as: MSIR Take 1 tablet (15 mg total) by mouth 2 (two) times daily as needed for severe pain.   morphine 15 MG tablet Commonly known as: MSIR Take 1 tablet (15 mg total) by mouth 2 (two) times daily as needed for severe pain.   Morphine Sulfate ER 200 MG Tbcr Take 200 mg by mouth 3 (three) times daily.   Morphine Sulfate ER 200 MG Tbcr Take 200 mg by mouth 3 (three) times daily.   Morphine Sulfate ER 200 MG Tbcr Take 200 mg by mouth 3 (three) times daily.   mupirocin ointment 2 % Commonly known as: BACTROBAN On leg wound w/dressing change bid   naloxone 4 MG/0.1ML Liqd nasal spray kit Commonly known as: NARCAN 1 actuation in one nostril once. May repeat in 2-3 min   OneTouch Delica Plus Lancet33G Misc USE TO check blood sugar TWICE DAILY   OneTouch Ultra test strip Generic drug: glucose blood USE TO TEST BLOOD SUGAR TWICE DAILY   Semaglutide (2 MG/DOSE) 8 MG/3ML Sopn Inject 2 mg as directed once a week. Replaces: Semaglutide (1 MG/DOSE) 4 MG/3ML Sopn Started by: Johnney Ou Asar Evilsizer   Toujeo Max SoloStar 300 UNIT/ML Solostar Pen Generic drug: insulin glargine (2 Unit Dial) Inject 50 Units into the skin every morning. And pen needles 1/day. What changed: how much to take Changed by: Johnney Ou Chistopher Mangino   Vitamin B-12 1000 MCG Subl Place 1 tablet (1,000 mcg total) under the tongue daily.   Vitamin D3 50 MCG (2000 UT) capsule Take 1 capsule (2,000 Units total) by mouth daily.         OBJECTIVE:   Vital Signs: BP 124/76 (BP Location: Left Arm, Patient Position: Sitting, Cuff Size: Large)   Pulse 80   Ht 5\' 9"   (1.753 m)   Wt 227 lb (103 kg)   SpO2 96%   BMI 33.52 kg/m   Wt Readings from Last 3 Encounters:  06/29/23 227 lb (103 kg)  04/22/23 227 lb (103 kg)  01/20/23 228 lb (103.4 kg)     Exam: General: Pt appears well and is in NAD  Lungs: Clear with good BS bilat   Heart: RRR   Extremities: Trace  pretibial edema.   Neuro: MS is good with appropriate  affect, pt is alert and Ox3    DM foot exam: 06/29/2023  The skin of the feet is intact without sores or ulcerations. The pedal pulses are 1+ on right and 1+ on left. The sensation is intact   to a screening 5.07, 10 gram monofilament on the right      DATA REVIEWED:  Lab Results  Component Value Date   HGBA1C 7.5 (A) 06/29/2023   HGBA1C 7.9 (A) 12/24/2022   HGBA1C 7.3 (A) 06/23/2022    ASSESSMENT / PLAN / RECOMMENDATIONS:   1) Type 2 Diabetes Mellitus, Sub-OPtimally controlled, With Neuropathic and macrovascular  complications - Most recent A1c of 7.5 %. Goal A1c < 7.0 %.    -A1c continues to trend down, but remains above goal -He does not recall being on higher doses of metformin, no change at this time -He discontinued acarbose due to hypoglycemia -Will increase Ozempic as below and decrease insulin preemptively to prevent hypoglycemia   MEDICATIONS: Continue Jardiance 25 mg daily Continue metformin 500 mg daily Decrease Toujeo 50 units daily Increase Ozempic 2 mg weekly   EDUCATION / INSTRUCTIONS: BG monitoring instructions: Patient is instructed to check his blood sugars 1 times a day, fasting . Call [facility] if: BG persistently < 70  I reviewed the Rule of 15 for the treatment of hypoglycemia in detail with the patient. Literature supplied.    2) Diabetic complications:  Eye: Does not have known diabetic retinopathy.  Neuro/ Feet: Does  have known diabetic peripheral neuropathy .  Renal: Patient does not have known baseline CKD. He   is  on an ACEI/ARB at present.     F/U in 6  months   Signed electronically by: Lyndle Herrlich, MD  [facility] Endocr[facility]ology  [facility] [address] [place], [place] [address] Phone: [phone] FAX: [phone]   CC: [name] Garter, MD [name] [place] [address] Phone: [phone]  Fax: [phone]  Return to Endocrinology clinic as below: Future Appointments  Date Time Provider Department Center  07/22/2023  1:40 PM Plotnikov, Georgina Quint, MD [id number] None

## 2023-06-29 NOTE — Patient Instructions (Addendum)
Increase  Ozempic 2 mg weekly  Decrease Toujeo to 50 units daily  Continue  Jardiance 25 mg, 1 tablet daily  Continue Metformin 500 mg, 1 tablet daily      HOW TO TREAT LOW BLOOD SUGARS (Blood sugar LESS THAN 70 MG/DL) Please follow the RULE OF 15 for the treatment of hypoglycemia treatment (when your (blood sugars are less than 70 mg/dL)   STEP 1: Take 15 grams of carbohydrates when your blood sugar is low, which includes:  3-4 GLUCOSE TABS  OR 3-4 OZ OF JUICE OR REGULAR SODA OR ONE TUBE OF GLUCOSE GEL    STEP 2: RECHECK blood sugar in 15 MINUTES STEP 3: If your blood sugar is still low at the 15 minute recheck --> then, go back to STEP 1 and treat AGAIN with another 15 grams of carbohydrates.

## 2023-06-30 ENCOUNTER — Encounter: Payer: Self-pay | Admitting: Internal Medicine

## 2023-06-30 DIAGNOSIS — Z794 Long term (current) use of insulin: Secondary | ICD-10-CM | POA: Insufficient documentation

## 2023-06-30 DIAGNOSIS — E119 Type 2 diabetes mellitus without complications: Secondary | ICD-10-CM | POA: Insufficient documentation

## 2023-07-16 ENCOUNTER — Other Ambulatory Visit: Payer: Self-pay | Admitting: Internal Medicine

## 2023-07-17 ENCOUNTER — Emergency Department (HOSPITAL_BASED_OUTPATIENT_CLINIC_OR_DEPARTMENT_OTHER): Payer: BC Managed Care – PPO | Admitting: Radiology

## 2023-07-17 ENCOUNTER — Emergency Department (HOSPITAL_BASED_OUTPATIENT_CLINIC_OR_DEPARTMENT_OTHER)
Admission: EM | Admit: 2023-07-17 | Discharge: 2023-07-17 | Disposition: A | Discharge status: Home / Self Care | Payer: BC Managed Care – PPO

## 2023-07-17 ENCOUNTER — Other Ambulatory Visit: Payer: Self-pay

## 2023-07-17 DIAGNOSIS — Z7951 Long term (current) use of inhaled steroids: Secondary | ICD-10-CM | POA: Insufficient documentation

## 2023-07-17 DIAGNOSIS — J449 Chronic obstructive pulmonary disease, unspecified: Secondary | ICD-10-CM | POA: Diagnosis not present

## 2023-07-17 DIAGNOSIS — Z7982 Long term (current) use of aspirin: Secondary | ICD-10-CM | POA: Diagnosis not present

## 2023-07-17 DIAGNOSIS — R0789 Other chest pain: Secondary | ICD-10-CM | POA: Diagnosis not present

## 2023-07-17 DIAGNOSIS — I498 Other specified cardiac arrhythmias: Secondary | ICD-10-CM | POA: Diagnosis not present

## 2023-07-17 DIAGNOSIS — I472 Ventricular tachycardia, unspecified: Secondary | ICD-10-CM | POA: Diagnosis not present

## 2023-07-17 DIAGNOSIS — F17210 Nicotine dependence, cigarettes, uncomplicated: Secondary | ICD-10-CM | POA: Insufficient documentation

## 2023-07-17 DIAGNOSIS — I517 Cardiomegaly: Secondary | ICD-10-CM | POA: Diagnosis not present

## 2023-07-17 DIAGNOSIS — I10 Essential (primary) hypertension: Secondary | ICD-10-CM | POA: Diagnosis not present

## 2023-07-17 DIAGNOSIS — Z794 Long term (current) use of insulin: Secondary | ICD-10-CM | POA: Insufficient documentation

## 2023-07-17 DIAGNOSIS — R0989 Other specified symptoms and signs involving the circulatory and respiratory systems: Secondary | ICD-10-CM | POA: Diagnosis not present

## 2023-07-17 DIAGNOSIS — Z79899 Other long term (current) drug therapy: Secondary | ICD-10-CM | POA: Diagnosis not present

## 2023-07-17 DIAGNOSIS — Z7984 Long term (current) use of oral hypoglycemic drugs: Secondary | ICD-10-CM | POA: Diagnosis not present

## 2023-07-17 DIAGNOSIS — E119 Type 2 diabetes mellitus without complications: Secondary | ICD-10-CM | POA: Insufficient documentation

## 2023-07-17 DIAGNOSIS — R008 Other abnormalities of heart beat: Secondary | ICD-10-CM | POA: Diagnosis not present

## 2023-07-17 DIAGNOSIS — R079 Chest pain, unspecified: Secondary | ICD-10-CM

## 2023-07-17 LAB — CBC
HCT: 48.1 % (ref 39.0–52.0)
Hemoglobin: 16.7 g/dL (ref 13.0–17.0)
MCH: 30.1 pg (ref 26.0–34.0)
MCHC: 34.7 g/dL (ref 30.0–36.0)
MCV: 86.7 fL (ref 80.0–100.0)
Platelets: 229 10*3/uL (ref 150–400)
RBC: 5.55 MIL/uL (ref 4.22–5.81)
RDW: 13.4 % (ref 11.5–15.5)
WBC: 9.7 10*3/uL (ref 4.0–10.5)
nRBC: 0 % (ref 0.0–0.2)

## 2023-07-17 LAB — TROPONIN I (HIGH SENSITIVITY)
Troponin I (High Sensitivity): 5 ng/L (ref <18)
Troponin I (High Sensitivity): 5 ng/L (ref <18)

## 2023-07-17 LAB — BASIC METABOLIC PANEL
Anion gap: 9 (ref 5–15)
BUN: 10 mg/dL (ref 6–20)
CO2: 26 mmol/L (ref 22–32)
Calcium: 9.5 mg/dL (ref 8.9–10.3)
Chloride: 96 mmol/L — ABNORMAL LOW (ref 98–111)
Creatinine, Ser: 0.85 mg/dL (ref 0.61–1.24)
GFR, Estimated: >60 mL/min (ref >60)
Glucose, Bld: 142 mg/dL — ABNORMAL HIGH (ref 70–99)
Potassium: 4.3 mmol/L (ref 3.5–5.1)
Sodium: 131 mmol/L — ABNORMAL LOW (ref 135–145)

## 2023-07-17 NOTE — ED Notes (Signed)
Swollen mass on right lower chest.

## 2023-07-17 NOTE — Discharge Instructions (Addendum)
Thank you for allowing [place] to be a part of your care today.  Your workup is overall very reassuring.  You do not have evidence of a heart attack.  You do have a new irregular cardiac rhythm.  I have placed a referral to the cardiologist office you have seen in the past.  They should call you to schedule a follow-up appointment.    I have attached helpful information about things you can do to help with your chest wall pain.  Return to the ED if develop sudden worsening of your symptoms or if you have any new concerns.

## 2023-07-17 NOTE — ED Triage Notes (Signed)
Chest pain starting around 1300. Central. Non radiating. -SOB. -N/-V. Pain woke his from nap.

## 2023-07-17 NOTE — ED Provider Notes (Signed)
[place] EMERGENCY DEPARTMENT AT [facility] Provider Note   CSN: [id number] Arrival date & time: 07/17/23  1700     History  No chief complaint on file.   [name] [name] is a 57 y.o. male with past medical history significant for COPD, hyperlipidemia, hypertension, diabetes, GERD, tobacco abuse presents to the ED complaining of central chest pain that began while he was taking a nap.  Patient states that the pain woke him up.  Pain is worse with palpation of the chest as well as taking a deep breath.  Denies shortness of breath, chest tightness, palpitations, lightheadedness, syncope.  Patient reports he smokes 2 packs of cigarettes per day.     Home Medications Prior to Admission medications   Medication Sig Start Date End Date Taking? Authorizing Provider  amLODipine-valsartan (EXFORGE) 10-320 MG tablet Take 1 tablet by mouth daily. 04/22/23   Plotnikov, Georgina Quint, MD  ANORO ELLIPTA 62.5-25 MCG/ACT AEPB INHALE ONE PUFF BY MOUTH DAILY 06/30/22   Plotnikov, Georgina Quint, MD  aspirin EC 81 MG tablet Take 81 mg by mouth daily.    [provider]  Cholecalciferol (VITAMIN D3) 50 MCG (2000 UT) capsule Take 1 capsule (2,000 Units total) by mouth daily. 05/03/21   Plotnikov, Georgina Quint, MD  Continuous Glucose Sensor (DEXCOM G7 SENSOR) MISC 1 Device by Does not apply route as directed. 12/24/22   Shamleffer, Konrad Dolores, MD  Cyanocobalamin (VITAMIN B-12) 1000 MCG SUBL Place 1 tablet (1,000 mcg total) under the tongue daily. 04/22/23   Plotnikov, Georgina Quint, MD  empagliflozin (JARDIANCE) 25 MG TABS tablet Take 1 tablet (25 mg total) by mouth daily before breakfast. 06/29/23   Shamleffer, Konrad Dolores, MD  fluticasone (FLONASE) 50 MCG/ACT nasal spray SPRAY 2 SPRAYS INTO EACH NOSTRIL EVERY DAY 02/20/20   Plotnikov, Georgina Quint, MD  furosemide (LASIX) 20 MG tablet TAKE 1 OR 2 TABLETS BY MOUTH DAILY AS NEEDED 02/04/22   Plotnikov, Georgina Quint, MD  insulin glargine, 2 Unit Dial,  (TOUJEO MAX SOLOSTAR) 300 UNIT/ML Solostar Pen Inject 50 Units into the skin every morning. And pen needles 1/day. 06/29/23   Shamleffer, Konrad Dolores, MD  Insulin Pen Needle 32G X 4 MM MISC 1 Device by Does not apply route daily in the afternoon. 06/29/23   Shamleffer, Konrad Dolores, MD  Lancets (ONETOUCH DELICA PLUS LANCET33G) MISC USE TO check blood sugar TWICE DAILY 10/18/21   Plotnikov, Georgina Quint, MD  lovastatin (MEVACOR) 20 MG tablet Take 1 tablet (20 mg total) by mouth daily. 04/22/23   Plotnikov, Georgina Quint, MD  metFORMIN (GLUCOPHAGE-XR) 500 MG 24 hr tablet Take 1 tablet (500 mg total) by mouth daily in the afternoon. 06/29/23   Shamleffer, Konrad Dolores, MD  morphine (MSIR) 15 MG tablet Take 1 tablet (15 mg total) by mouth 2 (two) times daily as needed for severe pain. 04/22/23   Plotnikov, Georgina Quint, MD  morphine (MSIR) 15 MG tablet Take 1 tablet (15 mg total) by mouth 2 (two) times daily as needed for severe pain. 04/22/23   Plotnikov, Georgina Quint, MD  morphine (MSIR) 15 MG tablet Take 1 tablet (15 mg total) by mouth 2 (two) times daily as needed for severe pain. 07/17/23   Plotnikov, Georgina Quint, MD  Morphine Sulfate ER 200 MG TBCR Take 200 mg by mouth 3 (three) times daily. 04/22/23   Plotnikov, Georgina Quint, MD  Morphine Sulfate ER 200 MG TBCR Take 200 mg by mouth 3 (three) times daily. 04/22/23   Plotnikov,  Georgina Quint, MD  Morphine Sulfate ER 200 MG TBCR TAKE ONE TABLET BY MOUTH THREE TIMES DAILY 07/17/23   Plotnikov, Georgina Quint, MD  mupirocin ointment (BACTROBAN) 2 % On leg wound w/dressing change bid 10/30/21   Plotnikov, Georgina Quint, MD  naloxone [facility]) nasal spray 4 mg/0.1 mL 1 actuation in one nostril once. May repeat in 2-3 min 04/23/22   Plotnikov, Georgina Quint, MD  [facility] ULTRA test strip USE TO TEST BLOOD SUGAR TWICE DAILY 05/06/23   Shamleffer, Konrad Dolores, MD  Semaglutide, 2 MG/DOSE, 8 MG/3ML SOPN Inject 2 mg as directed once a week. 06/29/23   Shamleffer, Konrad Dolores, MD   SYRINGE-NEEDLE, DISP, 3 ML (BD ECLIPSE SYRINGE) 25G X 1" 3 ML MISC Use for vitamin B12 shots as directed 05/03/21   Plotnikov, Georgina Quint, MD      Allergies    Belviq [lorcaserin hcl], Benazepril hcl, and Nsaids    Review of Systems   Review of Systems  Respiratory:  Negative for chest tightness and shortness of breath.   Cardiovascular:  Positive for chest pain. Negative for palpitations.  Neurological:  Negative for syncope and light-headedness.    Physical Exam Updated Vital Signs BP 135/85   Pulse 72   Temp (!) 97.4 F (36.3 C)   Resp 20   Ht 5\' 9"  (1.753 m)   Wt 104.3 kg   SpO2 96%   BMI 33.97 kg/m  Physical Exam Vitals and nursing note reviewed.  Constitutional:      General: He is not in acute distress.    Appearance: Normal appearance. He is not ill-appearing or diaphoretic.  Cardiovascular:     Rate and Rhythm: Normal rate and regular rhythm.     Heart sounds: Normal heart sounds.  Pulmonary:     Effort: Pulmonary effort is normal. No tachypnea or respiratory distress.     Breath sounds: Normal breath sounds and air entry.     Comments: Patient has increased pain with deep breaths Chest:     Chest wall: Mass and tenderness present. No swelling.    Skin:    General: Skin is warm and dry.     Capillary Refill: Capillary refill takes less than 2 seconds.  Neurological:     Mental Status: He is alert. Mental status is at baseline.  Psychiatric:        Mood and Affect: Mood normal.        Behavior: Behavior normal.     ED Results / Procedures / Treatments   Labs (all labs ordered are listed, but only abnormal results are displayed) Labs Reviewed  BASIC METABOLIC PANEL - Abnormal; Notable for the following components:      Result Value   Sodium 131 (*)    Chloride 96 (*)    Glucose, Bld 142 (*)    All other components within normal limits  CBC  TROPONIN I (HIGH SENSITIVITY)  TROPONIN I (HIGH SENSITIVITY)    EKG EKG  Interpretation Date/Time:  Friday July 17 2023 17:08:35 EST Ventricular Rate:  85 PR Interval:  158 QRS Duration:  86 QT Interval:  390 QTC Calculation: 464 R Axis:   33  Text Interpretation: Sinus rhythm with frequent Premature ventricular complexes in a pattern of bigeminy Cannot rule out Anterior infarct , age undetermined Abnormal ECG Confirmed by Estanislado Pandy [id number]) on 07/17/2023 7:51:27 PM  Radiology DG Chest 2 View  Result Date: 07/17/2023 CLINICAL DATA:  Chest pain. EXAM: CHEST - 2 VIEW COMPARISON:  Chest radiograph  dated 06/29/2014. FINDINGS: There is mild cardiomegaly with mild central vascular congestion. No focal consolidation, pleural effusion, or pneumothorax. No acute osseous pathology. IMPRESSION: Mild cardiomegaly with mild central vascular congestion. Electronically Signed   By: Elgie Collard M.D.   On: 07/17/2023 19:17    Procedures Procedures    Medications Ordered in ED Medications - No data to display  ED Course/ Medical Decision Making/ A&P                                 Medical Decision Making Amount and/or Complexity of Data Reviewed Labs: ordered. Radiology: ordered.   This patient presents to the ED with chief complaint(s) of chest pain with pertinent past medical history of hypertension, hyperlipidemia, GERD, tobacco abuse.  The complaint involves an extensive differential diagnosis and also carries with it a high risk of complications and morbidity.    The differential diagnosis includes ACS, chest wall strain or other injury, costochondritis, pleurisy   The initial plan is to obtain chest pain workup  Initial Assessment:   Exam significant for overall well-appearing patient who is not in acute respiratory distress.  Lungs are clear to auscultation bilaterally.  Patient does experience some increased chest wall pain with a deep breath.  There is a palpable, nonmobile tender mass to the anterior chest wall, just to the right of the  sternum.  No other chest wall abnormalities.  Heart rate is in the 70s with regular rhythm.  Patient on cardiac monitor alternating between sinus rhythm and ventricular bigeminy.  Independent ECG/labs interpretation:  The following labs were independently interpreted:  ECG shows ventricular bigeminy.   CBC without leukocytosis or anemia.  Metabolic panel with mild hyponatremia and hypochloremia.  No other major electrolyte disturbance.  Renal function is normal.  Troponin and repeat both 5.  Independent visualization and interpretation of imaging: I independently visualized the following imaging with scope of interpretation limited to determining acute life threatening conditions related to emergency care: Chest x-ray, which revealed mild cardiomegaly with central vascular congestion.  No infiltrate, pleural effusion, pneumothorax.  Consultations obtained:   I requested consultation with on-call cardiologist and spoke with Dr. Hyacinth Meeker.  He recommended patient follow-up outpatient with cardiologist.   Disposition:   Patient has seen [facility] health cardiologist in the past.  Referral placed for office to call him to set up a follow-up appointment.  Suspect the patient symptoms are musculoskeletal in nature.  Chest pain is atypical and reproducible.  Advised patient to take Tylenol and/or ibuprofen for symptoms and to use warm compresses to the chest wall.  The patient has been appropriately medically screened and/or stabilized in the ED. I have low suspicion for any other emergent medical condition which would require further screening, evaluation or treatment in the ED or require inpatient management. At time of discharge the patient is hemodynamically stable and in no acute distress. I have discussed work-up results and diagnosis with patient and answered all questions. Patient is agreeable with discharge plan. We discussed strict return precautions for returning to the emergency department and they  verbalized understanding.    Social Determinants of Health:   Patient's  tobacco abuse  increases the complexity of managing their presentation         Final Clinical Impression(s) / ED Diagnoses Final diagnoses:  Chest pain, unspecified type  Ventricular bigeminy    Rx / DC Orders ED Discharge Orders     None  Melton Alar R, PA-C 07/17/23 2353    Coral Spikes, DO 07/18/23 Reola Mosher

## 2023-07-17 NOTE — ED Notes (Signed)
 RN reviewed discharge instructions with pt. Pt verbalized understanding and had no further questions. VSS upon discharge.  

## 2023-07-18 ENCOUNTER — Other Ambulatory Visit: Payer: Self-pay | Admitting: Internal Medicine

## 2023-07-22 ENCOUNTER — Encounter: Payer: Self-pay | Admitting: Internal Medicine

## 2023-07-22 ENCOUNTER — Ambulatory Visit: Payer: BC Managed Care – PPO | Admitting: Internal Medicine

## 2023-07-22 VITALS — BP 112/60 | HR 76 | Temp 98.7°F | Ht 69.0 in | Wt 228.0 lb

## 2023-07-22 DIAGNOSIS — I10 Essential (primary) hypertension: Secondary | ICD-10-CM

## 2023-07-22 DIAGNOSIS — M545 Low back pain, unspecified: Secondary | ICD-10-CM | POA: Diagnosis not present

## 2023-07-22 DIAGNOSIS — E1165 Type 2 diabetes mellitus with hyperglycemia: Secondary | ICD-10-CM | POA: Diagnosis not present

## 2023-07-22 DIAGNOSIS — E66812 Obesity, class 2: Secondary | ICD-10-CM

## 2023-07-22 DIAGNOSIS — M79604 Pain in right leg: Secondary | ICD-10-CM

## 2023-07-22 DIAGNOSIS — E785 Hyperlipidemia, unspecified: Secondary | ICD-10-CM | POA: Diagnosis not present

## 2023-07-22 DIAGNOSIS — Z7985 Long-term (current) use of injectable non-insulin antidiabetic drugs: Secondary | ICD-10-CM

## 2023-07-22 DIAGNOSIS — E538 Deficiency of other specified B group vitamins: Secondary | ICD-10-CM

## 2023-07-22 DIAGNOSIS — E559 Vitamin D deficiency, unspecified: Secondary | ICD-10-CM

## 2023-07-22 DIAGNOSIS — M79605 Pain in left leg: Secondary | ICD-10-CM

## 2023-07-22 DIAGNOSIS — Z6837 Body mass index (BMI) 37.0-37.9, adult: Secondary | ICD-10-CM

## 2023-07-22 MED ORDER — MORPHINE SULFATE 15 MG PO TABS: 15.0000 mg | ORAL_TABLET | Freq: Two times a day (BID) | ORAL | 0 refills | Status: DC | PRN

## 2023-07-22 MED ORDER — MORPHINE SULFATE ER 200 MG PO TBCR: 1.0000 | EXTENDED_RELEASE_TABLET | Freq: Three times a day (TID) | ORAL | 0 refills | Status: DC

## 2023-07-22 MED ORDER — MORPHINE SULFATE ER 200 MG PO TBCR: 200.0000 mg | EXTENDED_RELEASE_TABLET | Freq: Three times a day (TID) | ORAL | 0 refills | Status: DC

## 2023-07-22 NOTE — Assessment & Plan Note (Signed)
Declined statins in the past. Using Lovastatin now

## 2023-07-22 NOTE — Progress Notes (Signed)
Subjective:  Patient ID: [name] [name], male    DOB: 1966/06/14  Age: 57 y.o. MRN: [id number]  CC: Follow-up ( 3 Month Follow Up. PT had hospital visit 11/15 for chest pain. Found PT had irregular heartbeat (ventricular bigeminy) and will follow up with Cardiology) F/u on chronic pain, DM  HPI [name] [name] presents for CP  on 11/15 2024 - s/p ER visit: "Patient has seen [facility] health cardiologist in the past.  Referral placed for office to call him to set up a follow-up appointment.  Suspect the patient symptoms are musculoskeletal in nature.  Chest pain is atypical and reproducible.  Advised patient to take Tylenol and/or ibuprofen for symptoms and to use warm compresses to the chest wall."    Outpatient Medications Prior to Visit  Medication Sig Dispense Refill   amLODipine-valsartan (EXFORGE) 10-320 MG tablet Take 1 tablet by mouth daily. 90 tablet 2   ANORO ELLIPTA 62.5-25 MCG/ACT AEPB INHALE ONE PUFF BY MOUTH DAILY 60 each 5   aspirin EC 81 MG tablet Take 81 mg by mouth daily.     Cholecalciferol (VITAMIN D3) 50 MCG (2000 UT) capsule Take 1 capsule (2,000 Units total) by mouth daily. 100 capsule 3   Continuous Glucose Sensor (DEXCOM G7 SENSOR) MISC 1 Device by Does not apply route as directed. 9 each 3   Cyanocobalamin (VITAMIN B-12) 1000 MCG SUBL Place 1 tablet (1,000 mcg total) under the tongue daily. 100 tablet 3   empagliflozin (JARDIANCE) 25 MG TABS tablet Take 1 tablet (25 mg total) by mouth daily before breakfast. 90 tablet 3   fluticasone (FLONASE) 50 MCG/ACT nasal spray SPRAY 2 SPRAYS INTO EACH NOSTRIL EVERY DAY 48 mL 1   furosemide (LASIX) 20 MG tablet TAKE 1 OR 2 TABLETS BY MOUTH DAILY AS NEEDED 60 tablet 3   insulin glargine, 2 Unit Dial, (TOUJEO MAX SOLOSTAR) 300 UNIT/ML Solostar Pen Inject 50 Units into the skin every morning. And pen needles 1/day. 15 mL 3   Insulin Pen Needle 32G X 4 MM MISC 1 Device by Does not apply route daily in the afternoon. 100 each 3    Lancets (ONETOUCH DELICA PLUS LANCET33G) MISC USE TO check blood sugar TWICE DAILY 100 each 3   lovastatin (MEVACOR) 20 MG tablet Take 1 tablet (20 mg total) by mouth daily. 90 tablet 3   metFORMIN (GLUCOPHAGE-XR) 500 MG 24 hr tablet Take 1 tablet (500 mg total) by mouth daily in the afternoon. 90 tablet 3   mupirocin ointment (BACTROBAN) 2 % On leg wound w/dressing change bid 30 g 0   naloxone (NARCAN) nasal spray 4 mg/0.1 mL 1 actuation in one nostril once. May repeat in 2-3 min 1 each 2   ONETOUCH ULTRA test strip USE TO TEST BLOOD SUGAR TWICE DAILY 100 strip 2   Semaglutide, 2 MG/DOSE, 8 MG/3ML SOPN Inject 2 mg as directed once a week. 9 mL 3   SYRINGE-NEEDLE, DISP, 3 ML (BD ECLIPSE SYRINGE) 25G X 1" 3 ML MISC Use for vitamin B12 shots as directed 50 each 3   morphine (MSIR) 15 MG tablet Take 1 tablet (15 mg total) by mouth 2 (two) times daily as needed for severe pain. 60 tablet 0   morphine (MSIR) 15 MG tablet Take 1 tablet (15 mg total) by mouth 2 (two) times daily as needed for severe pain. 60 tablet 0   morphine (MSIR) 15 MG tablet Take 1 tablet (15 mg total) by mouth 2 (two) times daily  as needed for severe pain. 60 tablet 0   Morphine Sulfate ER 200 MG TBCR Take 200 mg by mouth 3 (three) times daily. 90 tablet 0   Morphine Sulfate ER 200 MG TBCR Take 200 mg by mouth 3 (three) times daily. 90 tablet 0   Morphine Sulfate ER 200 MG TBCR TAKE ONE TABLET BY MOUTH THREE TIMES DAILY 90 tablet 0   No facility-administered medications prior to visit.    ROS: Review of Systems  Constitutional:  Negative for appetite change, fatigue and unexpected weight change.  HENT:  Negative for congestion, nosebleeds, sneezing, sore throat and trouble swallowing.   Eyes:  Negative for itching and visual disturbance.  Respiratory:  Negative for cough.   Cardiovascular:  Negative for chest pain, palpitations and leg swelling.  Gastrointestinal:  Negative for abdominal distention, blood in stool, diarrhea  and nausea.  Genitourinary:  Negative for frequency and hematuria.  Musculoskeletal:  Positive for back pain and gait problem. Negative for joint swelling and neck pain.  Skin:  Negative for rash.  Neurological:  Negative for dizziness, tremors, speech difficulty and weakness.  Psychiatric/Behavioral:  Negative for agitation, dysphoric mood, sleep disturbance and suicidal ideas. The patient is not nervous/anxious.     Objective:  BP 112/60   Pulse 76   Temp 98.7 F (37.1 C) (Oral)   Ht 5\' 9"  (1.753 m)   Wt 228 lb (103.4 kg)   SpO2 97%   BMI 33.67 kg/m   BP Readings from Last 3 Encounters:  07/22/23 112/60  07/17/23 (!) 141/73  06/29/23 124/76    Wt Readings from Last 3 Encounters:  07/22/23 228 lb (103.4 kg)  07/17/23 230 lb (104.3 kg)  06/29/23 227 lb (103 kg)    Physical Exam Constitutional:      General: He is not in acute distress.    Appearance: He is well-developed. He is obese.     Comments: NAD  Eyes:     Conjunctiva/sclera: Conjunctivae normal.     Pupils: Pupils are equal, round, and reactive to light.  Neck:     Thyroid: No thyromegaly.     Vascular: No JVD.  Cardiovascular:     Rate and Rhythm: Normal rate and regular rhythm.     Heart sounds: Normal heart sounds. No murmur heard.    No friction rub. No gallop.  Pulmonary:     Effort: Pulmonary effort is normal. No respiratory distress.     Breath sounds: Normal breath sounds. No wheezing or rales.  Chest:     Chest wall: No tenderness.  Abdominal:     General: Bowel sounds are normal. There is no distension.     Palpations: Abdomen is soft. There is no mass.     Tenderness: There is no abdominal tenderness. There is no guarding or rebound.  Musculoskeletal:        General: Tenderness present. Normal range of motion.     Cervical back: Normal range of motion.     Right lower leg: Edema present.     Left lower leg: No edema.  Lymphadenopathy:     Cervical: No cervical adenopathy.  Skin:     General: Skin is warm and dry.     Findings: No rash.  Neurological:     Mental Status: He is alert and oriented to person, place, and time.     Cranial Nerves: No cranial nerve deficit.     Motor: Weakness present. No abnormal muscle tone.     Coordination:  Coordination normal.     Gait: Gait abnormal.     Deep Tendon Reflexes: Reflexes are normal and symmetric.  Psychiatric:        Behavior: Behavior normal.        Thought Content: Thought content normal.        Judgment: Judgment normal.     Lab Results  Component Value Date   WBC 9.7 07/17/2023   HGB 16.7 07/17/2023   HCT 48.1 07/17/2023   PLT 229 07/17/2023   GLUCOSE 142 (H) 07/17/2023   CHOL 144 06/29/2023   TRIG 99.0 06/29/2023   HDL 55.80 06/29/2023   LDLDIRECT 116.6 05/20/2007   LDLCALC 68 06/29/2023   ALT 25 01/21/2022   AST 22 01/21/2022   NA 131 (L) 07/17/2023   K 4.3 07/17/2023   CL 96 (L) 07/17/2023   CREATININE 0.85 07/17/2023   BUN 10 07/17/2023   CO2 26 07/17/2023   TSH 3.88 01/21/2022   PSA 0.20 04/23/2018   INR 1.1 05/10/2008   HGBA1C 7.5 (A) 06/29/2023   MICROALBUR 1.2 06/29/2023    DG Chest 2 View  Result Date: 07/17/2023 CLINICAL DATA:  Chest pain. EXAM: CHEST - 2 VIEW COMPARISON:  Chest radiograph dated 06/29/2014. FINDINGS: There is mild cardiomegaly with mild central vascular congestion. No focal consolidation, pleural effusion, or pneumothorax. No acute osseous pathology. IMPRESSION: Mild cardiomegaly with mild central vascular congestion. Electronically Signed   By: Elgie Collard M.D.   On: 07/17/2023 19:17    Assessment & Plan:   Problem List Items Addressed This Visit     Dyslipidemia    Declined statins in the past. Using Lovastatin now      Obesity    On Ozempic Wt Readings from Last 3 Encounters:  07/22/23 228 lb (103.4 kg)  07/17/23 230 lb (104.3 kg)  06/29/23 227 lb (103 kg)         Low back pain radiating to both legs - Primary    Chronic severe LBP, on  opioids Continue with MSIR, MSContin   Potential benefits of a long term opioids  use as well as potential risks  and complications were explained to the patient and were aknowledged.       Relevant Medications   morphine (MSIR) 15 MG tablet   morphine (MSIR) 15 MG tablet   Morphine Sulfate ER 200 MG TBCR   Morphine Sulfate ER 200 MG TBCR   Morphine Sulfate ER 200 MG TBCR   morphine (MSIR) 15 MG tablet   Essential hypertension    Cont w/Amlodipine-Valsartan NAS diet      Uncontrolled type 2 diabetes mellitus with hyperglycemia (HCC)    On Ozempic now  Humalog, Basaglar,      B12 deficiency    On B12 SL.      Vitamin D deficiency    On Vit D         Meds ordered this encounter  Medications   morphine (MSIR) 15 MG tablet    Sig: Take 1 tablet (15 mg total) by mouth 2 (two) times daily as needed for severe pain (pain score 7-10).    Dispense:  60 tablet    Refill:  0    Fill on or after 07/24/23   morphine (MSIR) 15 MG tablet    Sig: Take 1 tablet (15 mg total) by mouth 2 (two) times daily as needed for severe pain (pain score 7-10).    Dispense:  60 tablet    Refill:  0  Fill on or after 08/23/23   Morphine Sulfate ER 200 MG TBCR    Sig: Take 200 mg by mouth 3 (three) times daily.    Dispense:  90 tablet    Refill:  0    Please fill on or after 09/22/23   Morphine Sulfate ER 200 MG TBCR    Sig: Take 200 mg by mouth 3 (three) times daily.    Dispense:  90 tablet    Refill:  0    Please fill on or after 07/24/23.   Morphine Sulfate ER 200 MG TBCR    Sig: Take 1 tablet by mouth 3 (three) times daily. Please fill on or after 08/23/23    Dispense:  90 tablet    Refill:  0   morphine (MSIR) 15 MG tablet    Sig: Take 1 tablet (15 mg total) by mouth 2 (two) times daily as needed for severe pain (pain score 7-10).    Dispense:  60 tablet    Refill:  0    Please fill on or after 09/22/23      Follow-up: Return in about 3 months (around 10/22/2023) for a  follow-up visit.  Sonda Primes, MD

## 2023-07-22 NOTE — Assessment & Plan Note (Addendum)
On Ozempic now  Humalog, Basaglar,

## 2023-07-22 NOTE — Assessment & Plan Note (Signed)
On Ozempic Wt Readings from Last 3 Encounters:  07/22/23 228 lb (103.4 kg)  07/17/23 230 lb (104.3 kg)  06/29/23 227 lb (103 kg)

## 2023-07-22 NOTE — Assessment & Plan Note (Signed)
Chronic severe LBP, on opioids Continue with MSIR, MSContin   Potential benefits of a long term opioids  use as well as potential risks  and complications were explained to the patient and were aknowledged.

## 2023-07-22 NOTE — Assessment & Plan Note (Signed)
On Vit D 

## 2023-07-22 NOTE — Assessment & Plan Note (Signed)
Cont w/Amlodipine-Valsartan NAS diet 

## 2023-07-22 NOTE — Assessment & Plan Note (Signed)
On B12 SL

## 2023-07-29 DIAGNOSIS — H04123 Dry eye syndrome of bilateral lacrimal glands: Secondary | ICD-10-CM | POA: Diagnosis not present

## 2023-07-29 DIAGNOSIS — H18833 Recurrent erosion of cornea, bilateral: Secondary | ICD-10-CM | POA: Diagnosis not present

## 2023-08-17 ENCOUNTER — Telehealth: Payer: Self-pay | Admitting: Internal Medicine

## 2023-08-17 NOTE — Telephone Encounter (Signed)
Patients morphine 200 mg. Should have refilled 08/17/2023 and the rx was put in for 12/22/;2024.  Can you correct for patient.

## 2023-08-19 NOTE — Telephone Encounter (Signed)
I think the date is correct.  Please check.  Thanks

## 2023-08-20 ENCOUNTER — Telehealth: Payer: Self-pay

## 2023-08-20 ENCOUNTER — Other Ambulatory Visit (HOSPITAL_COMMUNITY): Payer: Self-pay

## 2023-08-20 NOTE — Telephone Encounter (Signed)
Copied from CRM [id number]. Topic: Clinical - Prescription Issue >> Aug 20, 2023 12:47 PM Denese Killings wrote: Reason for CRM: Patient wife is calling to check on the prior Authorization for husbands Morphine 200 MG.

## 2023-08-20 NOTE — Telephone Encounter (Signed)
Pharmacy Patient Advocate Encounter   Received notification from CoverMyMeds that prior authorization for Morphine Sulfate 15MG  tablets is required/requested.   Insurance verification completed.   The patient is insured through [facility] .   Per test claim: PA required; PA submitted to above mentioned insurance via CoverMyMeds Key/confirmation #/EOC [id number] Status is pending

## 2023-08-20 NOTE — Telephone Encounter (Signed)
Pharmacy Patient Advocate Encounter   Received notification from CoverMyMeds that prior authorization for Morphine Sulfate ER 200MG  er tablets is required/requested.   Insurance verification completed.   The patient is insured through [facility] .   Per test claim: PA required; PA submitted to above mentioned insurance via CoverMyMeds Key/confirmation #/EOC [facility] Status is pending

## 2023-08-21 ENCOUNTER — Other Ambulatory Visit (HOSPITAL_COMMUNITY): Payer: Self-pay

## 2023-08-21 NOTE — Telephone Encounter (Signed)
PCP has stated "I think the date is correct." Tried calling pt no answer.

## 2023-08-24 ENCOUNTER — Other Ambulatory Visit (HOSPITAL_COMMUNITY): Payer: Self-pay

## 2023-08-24 NOTE — Telephone Encounter (Signed)
Pharmacy Patient Advocate Encounter  Received notification from [facility] that Prior Authorization for Morphine 15mg  tabs has been APPROVED from 08/22/23 to 09/19/23   PA #/Case ID/Reference #: [id number]

## 2023-08-24 NOTE — Telephone Encounter (Signed)
Pharmacy Patient Advocate Encounter  Received notification from [facility] that Prior Authorization for Morphine Sulfate ER 200mg   has been APPROVED from 08/20/23 to 08/19/24   PA #/Case ID/Reference #: [id number]

## 2023-08-25 NOTE — Telephone Encounter (Signed)
Noted! Thank you

## 2023-08-28 ENCOUNTER — Telehealth: Payer: Self-pay

## 2023-08-28 ENCOUNTER — Other Ambulatory Visit: Payer: Self-pay

## 2023-08-28 MED ORDER — DEXCOM G7 SENSOR MISC: 1.0000 | 3 refills | Status: DC

## 2023-08-28 NOTE — Telephone Encounter (Signed)
 Patient needs a PA for Dexcom G7 sensors

## 2023-08-31 ENCOUNTER — Telehealth: Payer: Self-pay

## 2023-08-31 ENCOUNTER — Other Ambulatory Visit (HOSPITAL_COMMUNITY): Payer: Self-pay

## 2023-08-31 NOTE — Telephone Encounter (Signed)
Pharmacy Patient Advocate Encounter   Received notification from Pt Calls Messages that prior authorization for Dexcom G7 sensor is required/requested.   Insurance verification completed.   The patient is insured through [facility] .   Per test claim: PA required; PA submitted to above mentioned insurance via CoverMyMeds Key/confirmation #/EOC [id number] Status is pending

## 2023-08-31 NOTE — Telephone Encounter (Signed)
Pharmacy Patient Advocate Encounter   Received notification from CoverMyMeds that prior authorization for Ozempic is required/requested.   Insurance verification completed.   The patient is insured through [facility] .   Per test claim: PA required; PA submitted to above mentioned insurance via CoverMyMeds Key/confirmation #/EOC [id number] Status is pending

## 2023-09-07 NOTE — Telephone Encounter (Signed)
 Pharmacy Patient Advocate Encounter  Received notification from [facility] that Prior Authorization for Dexcom G7 sensor has been APPROVED through 08/30/2024   PA #/Case ID/Reference #: [id number]

## 2023-09-07 NOTE — Telephone Encounter (Signed)
 Patient spouse notified.

## 2023-09-07 NOTE — Telephone Encounter (Signed)
 Pharmacy Patient Advocate Encounter  Received notification from [facility] that Prior Authorization for Ozempic has been APPROVED through 08/30/2024

## 2023-09-07 NOTE — Telephone Encounter (Signed)
Patient spouse is aware

## 2023-10-22 ENCOUNTER — Ambulatory Visit: Payer: BC Managed Care – PPO | Admitting: Internal Medicine

## 2023-10-23 ENCOUNTER — Ambulatory Visit: Payer: Medicare HMO | Admitting: Internal Medicine

## 2023-10-23 ENCOUNTER — Encounter: Payer: Self-pay | Admitting: Internal Medicine

## 2023-10-23 VITALS — BP 122/84 | HR 74 | Temp 98.0°F | Ht 69.0 in | Wt 220.0 lb

## 2023-10-23 DIAGNOSIS — E291 Testicular hypofunction: Secondary | ICD-10-CM

## 2023-10-23 DIAGNOSIS — E1165 Type 2 diabetes mellitus with hyperglycemia: Secondary | ICD-10-CM | POA: Diagnosis not present

## 2023-10-23 DIAGNOSIS — I2510 Atherosclerotic heart disease of native coronary artery without angina pectoris: Secondary | ICD-10-CM

## 2023-10-23 DIAGNOSIS — M79605 Pain in left leg: Secondary | ICD-10-CM

## 2023-10-23 DIAGNOSIS — E785 Hyperlipidemia, unspecified: Secondary | ICD-10-CM | POA: Diagnosis not present

## 2023-10-23 DIAGNOSIS — Z7985 Long-term (current) use of injectable non-insulin antidiabetic drugs: Secondary | ICD-10-CM

## 2023-10-23 DIAGNOSIS — M545 Low back pain, unspecified: Secondary | ICD-10-CM

## 2023-10-23 DIAGNOSIS — M79604 Pain in right leg: Secondary | ICD-10-CM

## 2023-10-23 DIAGNOSIS — Z6837 Body mass index (BMI) 37.0-37.9, adult: Secondary | ICD-10-CM

## 2023-10-23 DIAGNOSIS — E66812 Obesity, class 2: Secondary | ICD-10-CM | POA: Diagnosis not present

## 2023-10-23 DIAGNOSIS — I2583 Coronary atherosclerosis due to lipid rich plaque: Secondary | ICD-10-CM

## 2023-10-23 MED ORDER — MORPHINE SULFATE 15 MG PO TABS: 15.0000 mg | ORAL_TABLET | Freq: Two times a day (BID) | ORAL | 0 refills | Status: DC | PRN

## 2023-10-23 MED ORDER — MORPHINE SULFATE ER 200 MG PO TBCR: 1.0000 | EXTENDED_RELEASE_TABLET | Freq: Three times a day (TID) | ORAL | 0 refills | Status: DC

## 2023-10-23 MED ORDER — MORPHINE SULFATE ER 200 MG PO TBCR: 200.0000 mg | EXTENDED_RELEASE_TABLET | Freq: Three times a day (TID) | ORAL | 0 refills | Status: DC

## 2023-10-23 NOTE — Assessment & Plan Note (Signed)
  Off Rx - pt took it only for a short period of time - it did not help

## 2023-10-23 NOTE — Assessment & Plan Note (Signed)
Lost 50 lbs Good wt now - 220 lbs

## 2023-10-23 NOTE — Progress Notes (Signed)
Subjective:  Patient ID: [name] [name], male    DOB: 05/22/1966  Age: 58 y.o. MRN: [id number]  CC: Medical Management of Chronic Issues (3 mnth f/u)   HPI JORAN KALLAL presents for LBP, DM, HTN, CAD  Outpatient Medications Prior to Visit  Medication Sig Dispense Refill   amLODipine-valsartan (EXFORGE) 10-320 MG tablet Take 1 tablet by mouth daily. 90 tablet 2   ANORO ELLIPTA 62.5-25 MCG/ACT AEPB INHALE ONE PUFF BY MOUTH DAILY 60 each 5   aspirin EC 81 MG tablet Take 81 mg by mouth daily.     Cholecalciferol (VITAMIN D3) 50 MCG (2000 UT) capsule Take 1 capsule (2,000 Units total) by mouth daily. 100 capsule 3   Continuous Glucose Sensor (DEXCOM G7 SENSOR) MISC 1 Device by Does not apply route as directed. 9 each 3   Cyanocobalamin (VITAMIN B-12) 1000 MCG SUBL Place 1 tablet (1,000 mcg total) under the tongue daily. 100 tablet 3   empagliflozin (JARDIANCE) 25 MG TABS tablet Take 1 tablet (25 mg total) by mouth daily before breakfast. 90 tablet 3   fluticasone (FLONASE) 50 MCG/ACT nasal spray SPRAY 2 SPRAYS INTO EACH NOSTRIL EVERY DAY 48 mL 1   furosemide (LASIX) 20 MG tablet TAKE 1 OR 2 TABLETS BY MOUTH DAILY AS NEEDED 60 tablet 3   insulin glargine, 2 Unit Dial, (TOUJEO MAX SOLOSTAR) 300 UNIT/ML Solostar Pen Inject 50 Units into the skin every morning. And pen needles 1/day. 15 mL 3   Insulin Pen Needle 32G X 4 MM MISC 1 Device by Does not apply route daily in the afternoon. 100 each 3   Lancets (ONETOUCH DELICA PLUS LANCET33G) MISC USE TO check blood sugar TWICE DAILY 100 each 3   lovastatin (MEVACOR) 20 MG tablet Take 1 tablet (20 mg total) by mouth daily. 90 tablet 3   metFORMIN (GLUCOPHAGE-XR) 500 MG 24 hr tablet Take 1 tablet (500 mg total) by mouth daily in the afternoon. 90 tablet 3   mupirocin ointment (BACTROBAN) 2 % On leg wound w/dressing change bid 30 g 0   naloxone (NARCAN) nasal spray 4 mg/0.1 mL 1 actuation in one nostril once. May repeat in 2-3 min 1 each 2    ONETOUCH ULTRA test strip USE TO TEST BLOOD SUGAR TWICE DAILY 100 strip 2   Semaglutide, 2 MG/DOSE, 8 MG/3ML SOPN Inject 2 mg as directed once a week. 9 mL 3   SYRINGE-NEEDLE, DISP, 3 ML (BD ECLIPSE SYRINGE) 25G X 1" 3 ML MISC Use for vitamin B12 shots as directed 50 each 3   morphine (MSIR) 15 MG tablet Take 1 tablet (15 mg total) by mouth 2 (two) times daily as needed for severe pain (pain score 7-10). 60 tablet 0   morphine (MSIR) 15 MG tablet Take 1 tablet (15 mg total) by mouth 2 (two) times daily as needed for severe pain (pain score 7-10). 60 tablet 0   morphine (MSIR) 15 MG tablet Take 1 tablet (15 mg total) by mouth 2 (two) times daily as needed for severe pain (pain score 7-10). 60 tablet 0   Morphine Sulfate ER 200 MG TBCR Take 200 mg by mouth 3 (three) times daily. 90 tablet 0   Morphine Sulfate ER 200 MG TBCR Take 200 mg by mouth 3 (three) times daily. 90 tablet 0   Morphine Sulfate ER 200 MG TBCR Take 1 tablet by mouth 3 (three) times daily. Please fill on or after 08/23/23 90 tablet 0   No facility-administered medications  prior to visit.    ROS: Review of Systems  Constitutional:  Negative for appetite change, fatigue and unexpected weight change.  HENT:  Negative for congestion, nosebleeds, sneezing, sore throat and trouble swallowing.   Eyes:  Negative for itching and visual disturbance.  Respiratory:  Negative for cough.   Cardiovascular:  Negative for chest pain, palpitations and leg swelling.  Gastrointestinal:  Negative for abdominal distention, blood in stool, diarrhea and nausea.  Genitourinary:  Negative for frequency and hematuria.  Musculoskeletal:  Positive for arthralgias, back pain and gait problem. Negative for joint swelling and neck pain.  Skin:  Negative for rash.  Neurological:  Negative for dizziness, tremors, speech difficulty and weakness.  Psychiatric/Behavioral:  Negative for agitation, dysphoric mood and sleep disturbance. The patient is not  nervous/anxious.     Objective:  BP 122/84   Pulse 74   Temp 98 F (36.7 C) (Oral)   Ht 5\' 9"  (1.753 m)   Wt 220 lb (99.8 kg)   SpO2 96%   BMI 32.49 kg/m   BP Readings from Last 3 Encounters:  10/23/23 122/84  07/22/23 112/60  07/17/23 (!) 141/73    Wt Readings from Last 3 Encounters:  10/23/23 220 lb (99.8 kg)  07/22/23 228 lb (103.4 kg)  07/17/23 230 lb (104.3 kg)    Physical Exam Constitutional:      General: He is not in acute distress.    Appearance: He is well-developed. He is obese.     Comments: NAD  Eyes:     Conjunctiva/sclera: Conjunctivae normal.     Pupils: Pupils are equal, round, and reactive to light.  Neck:     Thyroid: No thyromegaly.     Vascular: No JVD.  Cardiovascular:     Rate and Rhythm: Normal rate and regular rhythm.     Heart sounds: Normal heart sounds. No murmur heard.    No friction rub. No gallop.  Pulmonary:     Effort: Pulmonary effort is normal. No respiratory distress.     Breath sounds: Normal breath sounds. No wheezing or rales.  Chest:     Chest wall: No tenderness.  Abdominal:     General: Bowel sounds are normal. There is no distension.     Palpations: Abdomen is soft. There is no mass.     Tenderness: There is no abdominal tenderness. There is no guarding or rebound.  Musculoskeletal:        General: Tenderness present. Normal range of motion.     Cervical back: Normal range of motion.  Lymphadenopathy:     Cervical: No cervical adenopathy.  Skin:    General: Skin is warm and dry.     Findings: No rash.  Neurological:     Mental Status: He is alert and oriented to person, place, and time.     Cranial Nerves: No cranial nerve deficit.     Motor: No abnormal muscle tone.     Coordination: Coordination normal.     Gait: Gait normal.     Deep Tendon Reflexes: Reflexes are normal and symmetric.  Psychiatric:        Behavior: Behavior normal.        Thought Content: Thought content normal.        Judgment: Judgment  normal.   Stiff LS w/pain  Lab Results  Component Value Date   WBC 9.7 07/17/2023   HGB 16.7 07/17/2023   HCT 48.1 07/17/2023   PLT 229 07/17/2023   GLUCOSE 142 (H) 07/17/2023  CHOL 144 06/29/2023   TRIG 99.0 06/29/2023   HDL 55.80 06/29/2023   LDLDIRECT 116.6 05/20/2007   LDLCALC 68 06/29/2023   ALT 25 01/21/2022   AST 22 01/21/2022   NA 131 (L) 07/17/2023   K 4.3 07/17/2023   CL 96 (L) 07/17/2023   CREATININE 0.85 07/17/2023   BUN 10 07/17/2023   CO2 26 07/17/2023   TSH 3.88 01/21/2022   PSA 0.20 04/23/2018   INR 1.1 05/10/2008   HGBA1C 7.5 (A) 06/29/2023   MICROALBUR 1.2 06/29/2023    DG Chest 2 View Result Date: 07/17/2023 CLINICAL DATA:  Chest pain. EXAM: CHEST - 2 VIEW COMPARISON:  Chest radiograph dated 06/29/2014. FINDINGS: There is mild cardiomegaly with mild central vascular congestion. No focal consolidation, pleural effusion, or pneumothorax. No acute osseous pathology. IMPRESSION: Mild cardiomegaly with mild central vascular congestion. Electronically Signed   By: Elgie Collard M.D.   On: 07/17/2023 19:17    Assessment & Plan:   Problem List Items Addressed This Visit     Hypogonadism in male    Off Rx - pt took it only for a short period of time - it did not help      Dyslipidemia - Primary   Taking Lovastatin now      Obesity   Lost 50 lbs Good wt now - 220 lbs      Low back pain radiating to both legs   Chronic severe LBP, on opioids Continue with MSIR, MSContin   Potential benefits of a long term opioids  use as well as potential risks  and complications were explained to the patient and were aknowledged.       Relevant Medications   morphine (MSIR) 15 MG tablet   morphine (MSIR) 15 MG tablet   morphine (MSIR) 15 MG tablet   Morphine Sulfate ER 200 MG TBCR   Morphine Sulfate ER 200 MG TBCR   Morphine Sulfate ER 200 MG TBCR   Uncontrolled type 2 diabetes mellitus with hyperglycemia (HCC)   Stable.  Continue with Humalog,  Basaglar, Rybelsus. On Ozempic now Endo appt/labs is pending       CAD (coronary artery disease)   Using Lovastatin now F/u w/Dr Jens Som         Meds ordered this encounter  Medications   morphine (MSIR) 15 MG tablet    Sig: Take 1 tablet (15 mg total) by mouth 2 (two) times daily as needed for severe pain (pain score 7-10).    Dispense:  60 tablet    Refill:  0    Fill on or after 11/18/23   morphine (MSIR) 15 MG tablet    Sig: Take 1 tablet (15 mg total) by mouth 2 (two) times daily as needed for severe pain (pain score 7-10).    Dispense:  60 tablet    Refill:  0    Fill on or after 12/18/23   morphine (MSIR) 15 MG tablet    Sig: Take 1 tablet (15 mg total) by mouth 2 (two) times daily as needed for severe pain (pain score 7-10).    Dispense:  60 tablet    Refill:  0    Fill on or after 01/17/24   Morphine Sulfate ER 200 MG TBCR    Sig: Take 200 mg by mouth 3 (three) times daily.    Dispense:  90 tablet    Refill:  0    Fill on or after 11/18/23   Morphine Sulfate ER 200 MG TBCR  Sig: Take 200 mg by mouth 3 (three) times daily.    Dispense:  90 tablet    Refill:  0    Fill on or after 12/18/23   Morphine Sulfate ER 200 MG TBCR    Sig: Take 1 tablet by mouth 3 (three) times daily.    Dispense:  90 tablet    Refill:  0    Fill on or after 01/17/24      Follow-up: Return in about 3 months (around 01/20/2024) for a follow-up visit.  Sonda Primes, MD

## 2023-10-23 NOTE — Assessment & Plan Note (Signed)
 Chronic severe LBP, on opioids Continue with MSIR, MSContin   Potential benefits of a long term opioids  use as well as potential risks  and complications were explained to the patient and were aknowledged.

## 2023-10-23 NOTE — Assessment & Plan Note (Signed)
Using Lovastatin now F/u w/Dr Crenshaw 

## 2023-10-23 NOTE — Assessment & Plan Note (Addendum)
Taking Lovastatin now

## 2023-10-23 NOTE — Assessment & Plan Note (Addendum)
Stable.  Continue with Humalog, Basaglar, Rybelsus. On Ozempic now Endo appt/labs is pending

## 2023-11-12 ENCOUNTER — Ambulatory Visit: Admitting: Family Medicine

## 2023-11-12 ENCOUNTER — Telehealth: Payer: Self-pay

## 2023-11-12 NOTE — Telephone Encounter (Signed)
 attempt to reach pt to see if pt can come in early for his appt. wife, on DPR, answered and reports she was about to call us to cancel appt and states pt told her he feels better and doesn't need the appt.   Appt is canceled.   Forwarding to provider for FYI.

## 2023-12-24 ENCOUNTER — Other Ambulatory Visit (HOSPITAL_COMMUNITY): Payer: Self-pay

## 2023-12-28 ENCOUNTER — Encounter: Payer: Self-pay | Admitting: Internal Medicine

## 2023-12-28 ENCOUNTER — Ambulatory Visit: Payer: BC Managed Care – PPO | Admitting: Internal Medicine

## 2023-12-28 VITALS — BP 138/80 | HR 53 | Ht 69.0 in | Wt 211.0 lb

## 2023-12-28 DIAGNOSIS — E1142 Type 2 diabetes mellitus with diabetic polyneuropathy: Secondary | ICD-10-CM

## 2023-12-28 DIAGNOSIS — E785 Hyperlipidemia, unspecified: Secondary | ICD-10-CM | POA: Diagnosis not present

## 2023-12-28 DIAGNOSIS — Z794 Long term (current) use of insulin: Secondary | ICD-10-CM

## 2023-12-28 LAB — POCT GLYCOSYLATED HEMOGLOBIN (HGB A1C): Hemoglobin A1C: 6.3 % — AB (ref 4.0–5.6)

## 2023-12-28 LAB — POCT GLUCOSE (DEVICE FOR HOME USE): POC Glucose: 118 mg/dL — AB (ref 70–99)

## 2023-12-28 MED ORDER — INSULIN PEN NEEDLE 32G X 4 MM MISC: 1.0000 | Freq: Every day | 3 refills | Status: DC

## 2023-12-28 MED ORDER — SEMAGLUTIDE (2 MG/DOSE) 8 MG/3ML ~~LOC~~ SOPN: 2.0000 mg | PEN_INJECTOR | SUBCUTANEOUS | 3 refills | Status: DC

## 2023-12-28 MED ORDER — LOVASTATIN 20 MG PO TABS: 20.0000 mg | ORAL_TABLET | Freq: Every day | ORAL | 3 refills | Status: DC

## 2023-12-28 MED ORDER — TOUJEO MAX SOLOSTAR 300 UNIT/ML ~~LOC~~ SOPN: 40.0000 [IU] | PEN_INJECTOR | SUBCUTANEOUS | 3 refills | Status: DC

## 2023-12-28 MED ORDER — EMPAGLIFLOZIN 25 MG PO TABS: 25.0000 mg | ORAL_TABLET | Freq: Every day | ORAL | 3 refills | Status: DC

## 2023-12-28 MED ORDER — METFORMIN HCL ER 500 MG PO TB24: 500.0000 mg | ORAL_TABLET | Freq: Every day | ORAL | 3 refills | Status: DC

## 2023-12-28 NOTE — Patient Instructions (Signed)
 Continue Ozempic  2 mg weekly  Decrease Toujeo  to 40 units daily  Continue  Jardiance  25 mg, 1 tablet daily  Continue Metformin  500 mg, 1 tablet daily      HOW TO TREAT LOW BLOOD SUGARS (Blood sugar LESS THAN 70 MG/DL) Please follow the RULE OF 15 for the treatment of hypoglycemia treatment (when your (blood sugars are less than 70 mg/dL)   STEP 1: Take 15 grams of carbohydrates when your blood sugar is low, which includes:  3-4 GLUCOSE TABS  OR 3-4 OZ OF JUICE OR REGULAR SODA OR ONE TUBE OF GLUCOSE GEL    STEP 2: RECHECK blood sugar in 15 MINUTES STEP 3: If your blood sugar is still low at the 15 minute recheck --> then, go back to STEP 1 and treat AGAIN with another 15 grams of carbohydrates.

## 2023-12-28 NOTE — Progress Notes (Signed)
 Name: [name] [name]  Age/ Sex: 58 y.o., male   MRN/ DOB: [id number], 1965-09-13     PCP: Genia Kettering, MD   Reason for Endocrinology Evaluation: Type 2 Diabetes Mellitus  Initial Endocrine Consultative Visit: 09/17/2012    PATIENT IDENTIFIER: [name] [name] is a 58 y.o. male with a past medical history of HTN, CAD, DM, COPD. The patient has followed with Endocrinology clinic since 09/17/2012 for consultative assistance with management of his diabetes.  DIABETIC HISTORY:  Mr. Padfield was diagnosed with DM 2005, and started insulin  therapy in 2007. His hemoglobin A1c has ranged from 10.6% in 2020, peaking at 7.5% in 2022.  He was followed by Dr. Washington Hacker from 2014 until March 2023   Stopped Acabose 07/2023  Decreased Jardiance  due to urinary incontinence 06/2023  SUBJECTIVE:   During the last visit (06/29/2023):A1c 7.5%  Today (12/28/2023): [name] is here for follow-up on diabetes management.. He has not used the Dexcom in months, he was having difficulty with the sensor.Checks glucose 1x daily, patient does endorse hypoglycemic episodes in the upper 60s.  Patient has been noted weight loss of approximately 16 LB since his last visit here Denies nausea, vomiting Denies  diarrhea , has noted constipation for the past 2 weeks that he attributes due to dehydration  Has occasional eye pain, typically sees the ophthalmologist a few times a year     HOME DIABETES REGIMEN:  Jardiance  25 mg, half a tablet daily  Metformin  500 mg XR daily  Ozempic  2 mg weekly  Toujeo  50 units daily     Statin: yes ACE-I/ARB: yes    METER DOWNLOAD SUMMARY: n/a    DIABETIC COMPLICATIONS: Microvascular complications:  Neuropathy Denies: CKD Last Eye Exam: Completed 2024  Macrovascular complications:  CAD Denies:  CVA, PVD   HISTORY:  Past Medical History:  Past Medical History:  Diagnosis Date   COPD (chronic obstructive pulmonary disease) (HCC)    Diabetes  mellitus    GERD (gastroesophageal reflux disease)    Hyperlipidemia    Hypertension    Lower back pain    Nephrolithiasis    Dr Inga Manges   Past Surgical History:  Past Surgical History:  Procedure Laterality Date   KNEE SURGERY     Social History:  reports that he has been smoking cigarettes. He has never used smokeless tobacco. He reports that he does not drink alcohol and does not use drugs. Family History:  Family History  Problem Relation Age of Onset   Hypertension Mother    Diabetes Mother    CAD Mother    Heart disease Other        CAD male relative <60   Vision loss Sister      HOME MEDICATIONS: Allergies as of 12/28/2023       Reactions   Belviq [lorcaserin  Hcl]    wierd   Benazepril Hcl    REACTION: cough   Nsaids    Can take short term Upset stomach        Medication List        Accurate as of December 28, 2023  1:59 PM. If you have any questions, ask your nurse or doctor.          amLODipine -valsartan  10-320 MG tablet Commonly known as: EXFORGE  Take 1 tablet by mouth daily.   Anoro Ellipta  62.5-25 MCG/ACT Aepb Generic drug: umeclidinium-vilanterol INHALE ONE PUFF BY MOUTH DAILY   aspirin EC 81 MG tablet Take 81 mg by mouth daily.  BD Eclipse Syringe 25G X 1" 3 ML Misc Generic drug: SYRINGE-NEEDLE (DISP) 3 ML Use for vitamin B12 shots as directed   Dexcom G7 Sensor Misc 1 Device by Does not apply route as directed.   empagliflozin  25 MG Tabs tablet Commonly known as: Jardiance  Take 1 tablet (25 mg total) by mouth daily before breakfast. What changed: how much to take   fluticasone  50 MCG/ACT nasal spray Commonly known as: FLONASE  SPRAY 2 SPRAYS INTO EACH NOSTRIL EVERY DAY   furosemide  20 MG tablet Commonly known as: LASIX  TAKE 1 OR 2 TABLETS BY MOUTH DAILY AS NEEDED   Insulin  Pen Needle 32G X 4 MM Misc 1 Device by Does not apply route daily in the afternoon.   lovastatin  20 MG tablet Commonly known as: MEVACOR  Take 1  tablet (20 mg total) by mouth daily.   metFORMIN  500 MG 24 hr tablet Commonly known as: GLUCOPHAGE -XR Take 1 tablet (500 mg total) by mouth daily in the afternoon.   morphine  15 MG tablet Commonly known as: MSIR Take 1 tablet (15 mg total) by mouth 2 (two) times daily as needed for severe pain (pain score 7-10).   morphine  15 MG tablet Commonly known as: MSIR Take 1 tablet (15 mg total) by mouth 2 (two) times daily as needed for severe pain (pain score 7-10).   morphine  15 MG tablet Commonly known as: MSIR Take 1 tablet (15 mg total) by mouth 2 (two) times daily as needed for severe pain (pain score 7-10).   Morphine  Sulfate ER 200 MG Tbcr Take 200 mg by mouth 3 (three) times daily.   Morphine  Sulfate ER 200 MG Tbcr Take 200 mg by mouth 3 (three) times daily.   Morphine  Sulfate ER 200 MG Tbcr Take 1 tablet by mouth 3 (three) times daily.   mupirocin  ointment 2 % Commonly known as: BACTROBAN  On leg wound w/dressing change bid   naloxone  4 MG/0.1ML Liqd nasal spray kit Commonly known as: NARCAN  1 actuation in one nostril once. May repeat in 2-3 min   OneTouch Delica Plus Lancet33G Misc USE TO check blood sugar TWICE DAILY   OneTouch Ultra test strip Generic drug: glucose blood USE TO TEST BLOOD SUGAR TWICE DAILY   Semaglutide  (2 MG/DOSE) 8 MG/3ML Sopn Inject 2 mg as directed once a week.   Toujeo  Max SoloStar 300 UNIT/ML Solostar Pen Generic drug: insulin  glargine (2 Unit Dial) Inject 50 Units into the skin every morning. And pen needles 1/day.   Vitamin B-12 1000 MCG Subl Place 1 tablet (1,000 mcg total) under the tongue daily.   Vitamin D3 50 MCG (2000 UT) capsule Take 1 capsule (2,000 Units total) by mouth daily.         OBJECTIVE:   Vital Signs: BP 138/80 (BP Location: Left Arm, Patient Position: Sitting, Cuff Size: Normal)   Pulse (!) 53   Ht 5\' 9"  (1.753 m)   Wt 211 lb (95.7 kg)   SpO2 99%   BMI 31.16 kg/m   Wt Readings from Last 3 Encounters:   12/28/23 211 lb (95.7 kg)  10/23/23 220 lb (99.8 kg)  07/22/23 228 lb (103.4 kg)     Exam: General: Pt appears well and is in NAD  Lungs: Clear with good BS bilat   Heart: RRR   Extremities: Trace  pretibial edema.   Neuro: MS is good with appropriate affect, pt is alert and Ox3    DM foot exam: 06/29/2023  The skin of the feet is intact  without sores or ulcerations. The pedal pulses are 1+ on right and 1+ on left. The sensation is intact   to a screening 5.07, 10 gram monofilament on the right      DATA REVIEWED:  Lab Results  Component Value Date   HGBA1C 7.5 (A) 06/29/2023   HGBA1C 7.9 (A) 12/24/2022   HGBA1C 7.3 (A) 06/23/2022     Latest Reference Range & Units 06/29/23 14:13  Sodium 135 - 145 mEq/L 132 (L)  Potassium 3.5 - 5.1 mEq/L 4.0  Chloride 96 - 112 mEq/L 94 (L)  CO2 19 - 32 mEq/L 27  Glucose 70 - 99 mg/dL 161 (H)  BUN 6 - 23 mg/dL 14  Creatinine 0.96 - 0.45 mg/dL 4.09  Calcium 8.4 - 81.1 mg/dL 9.9  GFR >91.47 mL/min 96.17     Latest Reference Range & Units 06/29/23 14:13  Total CHOL/HDL Ratio  3  Cholesterol 0 - 200 mg/dL 829  HDL Cholesterol >56.21 mg/dL 30.86  LDL (calc) 0 - 99 mg/dL 68  MICROALB/CREAT RATIO 0.0 - 30.0 mg/g 2.9  NonHDL  88.27  Triglycerides 0.0 - 149.0 mg/dL 57.8  VLDL 0.0 - 46.9 mg/dL 62.9    Latest Reference Range & Units 06/29/23 14:13  Creatinine,U mg/dL 52.8  Microalb, Ur 0.0 - 1.9 mg/dL 1.2  MICROALB/CREAT RATIO 0.0 - 30.0 mg/g 2.9     In office BG 118 mg/DL  ASSESSMENT / PLAN / RECOMMENDATIONS:   1) Type 2 Diabetes Mellitus, OPtimally controlled, With Neuropathic and macrovascular  complications - Most recent A1c of 6.3 %. Goal A1c < 7.0 %.    -I have praised the patient on improving glycemic control and weight loss -He does not recall being on higher doses of metformin , no change at this time -He discontinued acarbose  due to hypoglycemia - Intolerant to higher doses of Jardiance  due to urinary  incontinence - Will decrease insulin  due to hypoglycemia as below  MEDICATIONS: Continue Jardiance  25 mg, half a tablet daily Continue metformin  500 mg daily Decrease Toujeo  40 units daily Continue Ozempic  2 mg weekly   EDUCATION / INSTRUCTIONS: BG monitoring instructions: Patient is instructed to check his blood sugars 1 times a day, fasting . Call [facility] if: BG persistently < 70  I reviewed the Rule of 15 for the treatment of hypoglycemia in detail with the patient. Literature supplied.    2) Diabetic complications:  Eye: Does not have known diabetic retinopathy.  Neuro/ Feet: Does  have known diabetic peripheral neuropathy .  Renal: Patient does not have known baseline CKD. He   is  on an ACEI/ARB at present.   3) Dyslipidemia:  -Lipid panel at goal -Continue lovastatin  20 mg daily   F/U in 6 months   Signed electronically by: Natale Bail, MD  [facility]  [facility] [address] [place], [place] [address] Phone: [phone] FAX: [phone]   CC: Genia Kettering, MD [address] [place] [address] Phone: [phone]  Fax: [phone]  Return to Endocrinology clinic as below: Future Appointments  Date Time Provider Department Center  12/28/2023  2:00 PM Posey Petrik, Julian Obey, MD [facility] None  01/21/2024  2:40 PM Plotnikov, Oakley Bellman, MD [facility] None  01/27/2024  1:40 PM Wendie Hamburg, MD [facility]

## 2024-01-21 ENCOUNTER — Telehealth: Payer: Self-pay | Admitting: Internal Medicine

## 2024-01-21 ENCOUNTER — Ambulatory Visit (INDEPENDENT_AMBULATORY_CARE_PROVIDER_SITE_OTHER): Payer: Medicare HMO | Admitting: Internal Medicine

## 2024-01-21 ENCOUNTER — Encounter: Payer: Self-pay | Admitting: Internal Medicine

## 2024-01-21 VITALS — BP 160/82 | HR 74 | Temp 98.0°F | Ht 69.0 in | Wt 212.0 lb

## 2024-01-21 DIAGNOSIS — Z6837 Body mass index (BMI) 37.0-37.9, adult: Secondary | ICD-10-CM

## 2024-01-21 DIAGNOSIS — K219 Gastro-esophageal reflux disease without esophagitis: Secondary | ICD-10-CM

## 2024-01-21 DIAGNOSIS — I10 Essential (primary) hypertension: Secondary | ICD-10-CM

## 2024-01-21 DIAGNOSIS — E538 Deficiency of other specified B group vitamins: Secondary | ICD-10-CM

## 2024-01-21 DIAGNOSIS — M545 Low back pain, unspecified: Secondary | ICD-10-CM | POA: Diagnosis not present

## 2024-01-21 DIAGNOSIS — M79604 Pain in right leg: Secondary | ICD-10-CM

## 2024-01-21 DIAGNOSIS — R1319 Other dysphagia: Secondary | ICD-10-CM | POA: Insufficient documentation

## 2024-01-21 DIAGNOSIS — M79605 Pain in left leg: Secondary | ICD-10-CM | POA: Diagnosis not present

## 2024-01-21 DIAGNOSIS — E66812 Obesity, class 2: Secondary | ICD-10-CM

## 2024-01-21 MED ORDER — MORPHINE SULFATE 15 MG PO TABS: 15.0000 mg | ORAL_TABLET | Freq: Two times a day (BID) | ORAL | 0 refills | Status: DC | PRN

## 2024-01-21 MED ORDER — PANTOPRAZOLE SODIUM 40 MG PO TBEC: 40.0000 mg | DELAYED_RELEASE_TABLET | Freq: Two times a day (BID) | ORAL | 11 refills | Status: AC

## 2024-01-21 MED ORDER — MORPHINE SULFATE ER 200 MG PO TBCR: 1.0000 | EXTENDED_RELEASE_TABLET | Freq: Three times a day (TID) | ORAL | 0 refills | Status: DC

## 2024-01-21 MED ORDER — MORPHINE SULFATE ER 200 MG PO TBCR: 200.0000 mg | EXTENDED_RELEASE_TABLET | Freq: Three times a day (TID) | ORAL | 0 refills | Status: DC

## 2024-01-21 MED ORDER — AMLODIPINE BESYLATE-VALSARTAN 10-320 MG PO TABS: 1.0000 | ORAL_TABLET | Freq: Every day | ORAL | 2 refills | Status: AC

## 2024-01-21 NOTE — Assessment & Plan Note (Signed)
 Chronic severe LBP, on opioids Continue with MSIR, MSContin   Potential benefits of a long term opioids  use as well as potential risks  and complications were explained to the patient and were aknowledged.

## 2024-01-21 NOTE — Assessment & Plan Note (Signed)
On B12 SL

## 2024-01-21 NOTE — Assessment & Plan Note (Signed)
 Wt Readings from Last 3 Encounters:  01/21/24 212 lb (96.2 kg)  12/28/23 211 lb (95.7 kg)  10/23/23 220 lb (99.8 kg)  On Ozempic 

## 2024-01-21 NOTE — Assessment & Plan Note (Signed)
Cont w/Amlodipine-Valsartan NAS diet 

## 2024-01-21 NOTE — Assessment & Plan Note (Signed)
 Worse Start Protonix 40 mg bid GI ref

## 2024-01-21 NOTE — Telephone Encounter (Signed)
 Copied from CRM [id number]. Topic: Clinical - Prescription Issue >> Jan 21, 2024  3:45 PM Albertha Alosa wrote: Reason for CRM: [facility] called in regarding patient prescriptions Morphine  Sulfate ER 200 MG TBCR refill morphine  (MSIR) 15 MG tablet , stated they usually  are refilled on the same dates but the dates are different   [phone]

## 2024-01-21 NOTE — Progress Notes (Signed)
 Subjective:  Patient ID: [name] [name], male    DOB: 31-Jan-1966  Age: 59 y.o. MRN: [id number]  CC: Medical Management of Chronic Issues (3 mnth f/u)   HPI [name] [name] presents for LBP, DM, HTN, pain C/o difficulty swallowing and GERD sx's x 1 year  Outpatient Medications Prior to Visit  Medication Sig Dispense Refill   ANORO ELLIPTA  62.5-25 MCG/ACT AEPB INHALE ONE PUFF BY MOUTH DAILY 60 each 5   aspirin EC 81 MG tablet Take 81 mg by mouth daily.     Cholecalciferol (VITAMIN D3) 50 MCG (2000 UT) capsule Take 1 capsule (2,000 Units total) by mouth daily. 100 capsule 3   Continuous Glucose Sensor (DEXCOM G7 SENSOR) MISC 1 Device by Does not apply route as directed. 9 each 3   Cyanocobalamin  (VITAMIN B-12) 1000 MCG SUBL Place 1 tablet (1,000 mcg total) under the tongue daily. 100 tablet 3   empagliflozin  (JARDIANCE ) 25 MG TABS tablet Take 1 tablet (25 mg total) by mouth daily before breakfast. 90 tablet 3   fluticasone  (FLONASE ) 50 MCG/ACT nasal spray SPRAY 2 SPRAYS INTO EACH NOSTRIL EVERY DAY 48 mL 1   furosemide  (LASIX ) 20 MG tablet TAKE 1 OR 2 TABLETS BY MOUTH DAILY AS NEEDED 60 tablet 3   insulin  glargine, 2 Unit Dial, (TOUJEO  MAX SOLOSTAR) 300 UNIT/ML Solostar Pen Inject 40 Units into the skin every morning. And pen needles 1/day. 15 mL 3   Insulin  Pen Needle 32G X 4 MM MISC 1 Device by Does not apply route daily in the afternoon. 100 each 3   Lancets (ONETOUCH DELICA PLUS LANCET33G) MISC USE TO check blood sugar TWICE DAILY 100 each 3   lovastatin  (MEVACOR ) 20 MG tablet Take 1 tablet (20 mg total) by mouth daily. 90 tablet 3   metFORMIN  (GLUCOPHAGE -XR) 500 MG 24 hr tablet Take 1 tablet (500 mg total) by mouth daily in the afternoon. 90 tablet 3   morphine  (MSIR) 15 MG tablet Take 1 tablet (15 mg total) by mouth 2 (two) times daily as needed for severe pain (pain score 7-10). 60 tablet 0   mupirocin  ointment (BACTROBAN ) 2 % On leg wound w/dressing change bid 30 g 0   naloxone   (NARCAN ) nasal spray 4 mg/0.1 mL 1 actuation in one nostril once. May repeat in 2-3 min 1 each 2   ONETOUCH ULTRA test strip USE TO TEST BLOOD SUGAR TWICE DAILY 100 strip 2   Semaglutide , 2 MG/DOSE, 8 MG/3ML SOPN Inject 2 mg as directed once a week. 9 mL 3   SYRINGE-NEEDLE, DISP, 3 ML (BD ECLIPSE SYRINGE) 25G X 1" 3 ML MISC Use for vitamin B12 shots as directed 50 each 3   amLODipine -valsartan  (EXFORGE ) 10-320 MG tablet Take 1 tablet by mouth daily. 90 tablet 2   morphine  (MSIR) 15 MG tablet Take 1 tablet (15 mg total) by mouth 2 (two) times daily as needed for severe pain (pain score 7-10). 60 tablet 0   morphine  (MSIR) 15 MG tablet Take 1 tablet (15 mg total) by mouth 2 (two) times daily as needed for severe pain (pain score 7-10). 60 tablet 0   Morphine  Sulfate ER 200 MG TBCR Take 200 mg by mouth 3 (three) times daily. 90 tablet 0   Morphine  Sulfate ER 200 MG TBCR Take 200 mg by mouth 3 (three) times daily. 90 tablet 0   Morphine  Sulfate ER 200 MG TBCR Take 1 tablet by mouth 3 (three) times daily. 90 tablet 0  No facility-administered medications prior to visit.    ROS: Review of Systems  Constitutional:  Positive for fatigue and unexpected weight change. Negative for appetite change.  HENT:  Negative for congestion, nosebleeds, sneezing, sore throat and trouble swallowing.   Eyes:  Negative for itching and visual disturbance.  Respiratory:  Negative for cough.   Cardiovascular:  Negative for chest pain, palpitations and leg swelling.  Gastrointestinal:  Negative for abdominal distention, blood in stool, diarrhea and nausea.  Genitourinary:  Negative for frequency and hematuria.  Musculoskeletal:  Positive for arthralgias, back pain and gait problem. Negative for joint swelling and neck pain.  Skin:  Negative for rash.  Neurological:  Negative for dizziness, tremors, speech difficulty and weakness.  Psychiatric/Behavioral:  Negative for agitation, dysphoric mood, sleep disturbance and  suicidal ideas. The patient is not nervous/anxious.     Objective:  BP (!) 160/82 (BP Location: Left Arm, Patient Position: Sitting)   Pulse 74   Temp 98 F (36.7 C) (Temporal)   Ht 5\' 9"  (1.753 m)   Wt 212 lb (96.2 kg)   SpO2 99%   BMI 31.31 kg/m   BP Readings from Last 3 Encounters:  01/21/24 (!) 160/82  12/28/23 138/80  10/23/23 122/84    Wt Readings from Last 3 Encounters:  01/21/24 212 lb (96.2 kg)  12/28/23 211 lb (95.7 kg)  10/23/23 220 lb (99.8 kg)    Physical Exam Constitutional:      General: He is not in acute distress.    Appearance: He is well-developed. He is obese.     Comments: NAD  Eyes:     Conjunctiva/sclera: Conjunctivae normal.     Pupils: Pupils are equal, round, and reactive to light.  Neck:     Thyroid : No thyromegaly.     Vascular: No JVD.  Cardiovascular:     Rate and Rhythm: Normal rate and regular rhythm.     Heart sounds: Normal heart sounds. No murmur heard.    No friction rub. No gallop.  Pulmonary:     Effort: Pulmonary effort is normal. No respiratory distress.     Breath sounds: Normal breath sounds. No wheezing or rales.  Chest:     Chest wall: No tenderness.  Abdominal:     General: Bowel sounds are normal. There is no distension.     Palpations: Abdomen is soft. There is no mass.     Tenderness: There is no abdominal tenderness. There is no guarding or rebound.  Musculoskeletal:        General: Tenderness present. Normal range of motion.     Cervical back: Normal range of motion.     Right lower leg: No edema.     Left lower leg: No edema.  Lymphadenopathy:     Cervical: No cervical adenopathy.  Skin:    General: Skin is warm and dry.     Findings: No rash.  Neurological:     Mental Status: He is alert and oriented to person, place, and time.     Cranial Nerves: No cranial nerve deficit.     Motor: No abnormal muscle tone.     Coordination: Coordination normal.     Gait: Gait abnormal.     Deep Tendon Reflexes:  Reflexes are normal and symmetric.  Psychiatric:        Behavior: Behavior normal.        Thought Content: Thought content normal.        Judgment: Judgment normal.    Antalgic gait  Lab Results  Component Value Date   WBC 9.7 07/17/2023   HGB 16.7 07/17/2023   HCT 48.1 07/17/2023   PLT 229 07/17/2023   GLUCOSE 142 (H) 07/17/2023   CHOL 144 06/29/2023   TRIG 99.0 06/29/2023   HDL 55.80 06/29/2023   LDLDIRECT 116.6 05/20/2007   LDLCALC 68 06/29/2023   ALT 25 01/21/2022   AST 22 01/21/2022   NA 131 (L) 07/17/2023   K 4.3 07/17/2023   CL 96 (L) 07/17/2023   CREATININE 0.85 07/17/2023   BUN 10 07/17/2023   CO2 26 07/17/2023   TSH 3.88 01/21/2022   PSA 0.20 04/23/2018   INR 1.1 05/10/2008   HGBA1C 6.3 (A) 12/28/2023   MICROALBUR 1.2 06/29/2023    DG Chest 2 View Result Date: 07/17/2023 CLINICAL DATA:  Chest pain. EXAM: CHEST - 2 VIEW COMPARISON:  Chest radiograph dated 06/29/2014. FINDINGS: There is mild cardiomegaly with mild central vascular congestion. No focal consolidation, pleural effusion, or pneumothorax. No acute osseous pathology. IMPRESSION: Mild cardiomegaly with mild central vascular congestion. Electronically Signed   By: Angus Bark M.D.   On: 07/17/2023 19:17    Assessment & Plan:   Problem List Items Addressed This Visit     Obesity   Wt Readings from Last 3 Encounters:  01/21/24 212 lb (96.2 kg)  12/28/23 211 lb (95.7 kg)  10/23/23 220 lb (99.8 kg)  On Ozempic        GERD (gastroesophageal reflux disease)   Worse Start Protonix 40 mg bid GI ref      Relevant Medications   pantoprazole (PROTONIX) 40 MG tablet   Low back pain radiating to both legs   Chronic severe LBP,  on opioids Continue with MSIR, MSContin   Potential benefits of a long term opioids  use as well as potential risks  and complications were explained to the patient and were aknowledged.       Relevant Medications   morphine  (MSIR) 15 MG tablet   morphine  (MSIR)  15 MG tablet   Morphine  Sulfate ER 200 MG TBCR   Morphine  Sulfate ER 200 MG TBCR   Morphine  Sulfate ER 200 MG TBCR   morphine  (MSIR) 15 MG tablet   Essential hypertension   Cont w/Amlodipine -Valsartan  NAS diet      Relevant Medications   amLODipine -valsartan  (EXFORGE ) 10-320 MG tablet   B12 deficiency   On B12 SL.      Esophageal dysphagia - Primary   Relevant Orders   Ambulatory referral to Gastroenterology      Meds ordered this encounter  Medications   amLODipine -valsartan  (EXFORGE ) 10-320 MG tablet    Sig: Take 1 tablet by mouth daily.    Dispense:  90 tablet    Refill:  2   pantoprazole (PROTONIX) 40 MG tablet    Sig: Take 1 tablet (40 mg total) by mouth 2 (two) times daily.    Dispense:  60 tablet    Refill:  11   morphine  (MSIR) 15 MG tablet    Sig: Take 1 tablet (15 mg total) by mouth 2 (two) times daily as needed for severe pain (pain score 7-10).    Dispense:  60 tablet    Refill:  0    Fill on or after 01/17/24   morphine  (MSIR) 15 MG tablet    Sig: Take 1 tablet (15 mg total) by mouth 2 (two) times daily as needed for severe pain (pain score 7-10).    Dispense:  60 tablet  Refill:  0    Fill on or after 02/16/24   Morphine  Sulfate ER 200 MG TBCR    Sig: Take 200 mg by mouth 3 (three) times daily.    Dispense:  90 tablet    Refill:  0    Fill on or after 03/17/24   Morphine  Sulfate ER 200 MG TBCR    Sig: Take 200 mg by mouth 3 (three) times daily.    Dispense:  90 tablet    Refill:  0    Fill on or after 02/16/24   Morphine  Sulfate ER 200 MG TBCR    Sig: Take 1 tablet by mouth 3 (three) times daily.    Dispense:  90 tablet    Refill:  0    Fill on or after 03/17/24   morphine  (MSIR) 15 MG tablet    Sig: Take 1 tablet (15 mg total) by mouth 2 (two) times daily as needed for severe pain (pain score 7-10).    Dispense:  60 tablet    Refill:  0    Please fill on or after 03/17/24      Follow-up: Return in about 3 months (around 04/22/2024) for a  follow-up visit.  Anitra Barn, MD

## 2024-01-22 NOTE — Telephone Encounter (Signed)
 Copied from CRM [id number]. Topic: Clinical - Prescription Issue >> Jan 22, 2024 10:56 AM Dimple Francis wrote: Reason for CRM: Prescription was called in with the incorrect date listed.. it says 7/17 instead of 5/17, they need a new one to be sent in as soon as possible.  ---  Sent direct message to DOD, Adella Agee and was given permission to reach out to the pharmacy and change the fill date.

## 2024-01-26 NOTE — Progress Notes (Unsigned)
 Cardiology Office Note:    Date:  01/27/2024   ID:  [name] [name], DOB 03-30-1966, MRN [id number]  PCP:  [name] Kettering, MD  Cardiologist:  None  Electrophysiologist:  None   Referring MD: [name] Goon, PA-C   Chief Complaint  Patient presents with   Chest Pain    History of Present Illness:    [name] [name] is a 58 y.o. male with a hx of COPD, hypertension, hyperlipidemia, tobacco use, diabetes who presents as an ED follow-up for chest pain.  Presented to ED with chest pain in 07/2023.  Workup unremarkable.  He had previously followed with Dr. Audery Blazing, last seen in 2019.  Echocardiogram 03/2018 showed EF 60 to 65%, normal RV function, no significant valvular disease.  Lexiscan  Myoview  03/2018 showed normal perfusion, LVEF 62%.  Calcium score 11/09/2019 was 245 (90th percentile).  Carotid duplex 12/2019 showed mild bilateral stenosis (1 to 39%).  He reports no recent chest pain, states that when he went to the ED he had a superficial lump in his chest that was likely causing his pain, resolved and pain went away.  Does report that he is short of breath with exertion.  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  He denies any lightheadedness, syncope, edema, or palpitations.  Smokes 2 packs/day, smoked for 40 years.  No known family history of heart disease.   Past Medical History:  Diagnosis Date   COPD (chronic obstructive pulmonary disease) (HCC)    Diabetes mellitus    GERD (gastroesophageal reflux disease)    Hyperlipidemia    Hypertension    Lower back pain    Nephrolithiasis    Dr [name] [name]    Past Surgical History:  Procedure Laterality Date   KNEE SURGERY      Current Medications: Current Meds  Medication Sig   amLODipine -valsartan  (EXFORGE ) 10-320 MG tablet Take 1 tablet by mouth daily.   ANORO ELLIPTA  62.5-25 MCG/ACT AEPB INHALE ONE PUFF BY MOUTH DAILY   aspirin EC 81 MG  tablet Take 81 mg by mouth daily.   Cholecalciferol (VITAMIN D3) 50 MCG (2000 UT) capsule Take 1 capsule (2,000 Units total) by mouth daily.   Cyanocobalamin  (VITAMIN B-12) 1000 MCG SUBL Place 1 tablet (1,000 mcg total) under the tongue daily.   empagliflozin  (JARDIANCE ) 25 MG TABS tablet Take 1 tablet (25 mg total) by mouth daily before breakfast.   fluticasone  (FLONASE ) 50 MCG/ACT nasal spray SPRAY 2 SPRAYS INTO EACH NOSTRIL EVERY DAY   furosemide  (LASIX ) 20 MG tablet TAKE 1 OR 2 TABLETS BY MOUTH DAILY AS NEEDED   insulin  glargine, 2 Unit Dial, (TOUJEO  MAX SOLOSTAR) 300 UNIT/ML Solostar Pen Inject 40 Units into the skin every morning. And pen needles 1/day.   lovastatin  (MEVACOR ) 20 MG tablet Take 1 tablet (20 mg total) by mouth daily.   metFORMIN  (GLUCOPHAGE -XR) 500 MG 24 hr tablet Take 1 tablet (500 mg total) by mouth daily in the afternoon.   metoprolol tartrate (LOPRESSOR) 25 MG tablet Take 2 hours before CT scan.   morphine  (MSIR) 15 MG tablet Take 1 tablet (15 mg total) by mouth 2 (two) times daily as needed for severe pain (pain score 7-10).   Morphine  Sulfate ER 200 MG TBCR Take 200 mg by mouth 3 (three) times daily.   naloxone  (NARCAN ) nasal spray 4 mg/0.1 mL 1 actuation in one nostril once. May repeat in 2-3  min   ONETOUCH ULTRA test strip USE TO TEST BLOOD SUGAR TWICE DAILY   pantoprazole (PROTONIX) 40 MG tablet Take 1 tablet (40 mg total) by mouth 2 (two) times daily.   Semaglutide , 2 MG/DOSE, 8 MG/3ML SOPN Inject 2 mg as directed once a week.     Allergies:   Belviq [lorcaserin  hcl], Benazepril hcl, and Nsaids   Social History   Socioeconomic History   Marital status: Married    Spouse name: Not on file   Number of children: 2   Years of education: Not on file   Highest education level: Not on file  Occupational History   Not on file  Tobacco Use   Smoking status: Every Day    Current packs/day: 2.00    Types: Cigarettes   Smokeless tobacco: Never  Substance and  Sexual Activity   Alcohol use: Never   Drug use: No   Sexual activity: Yes  Other Topics Concern   Not on file  Social History Narrative   No regular exercise   Social Drivers of Health   Financial Resource Strain: Not on file  Food Insecurity: Not on file  Transportation Needs: Not on file  Physical Activity: Not on file  Stress: Not on file  Social Connections: Not on file     Family History: The patient's family history includes CAD in his mother; Diabetes in his mother; Heart disease in an other family member; Hypertension in his mother; Vision loss in his sister.  ROS:   Please see the history of present illness.     All other systems reviewed and are negative.  EKGs/Labs/Other Studies Reviewed:    The following studies were reviewed today:   EKG:  01/27/2024: Normal sinus rhythm, rate 68, no ST abnormalities  Recent Labs: 07/17/2023: BUN 10; Creatinine, Ser 0.85; Hemoglobin 16.7; Platelets 229; Potassium 4.3; Sodium 131  Recent Lipid Panel    Component Value Date/Time   CHOL 144 06/29/2023 1413   TRIG 99.0 06/29/2023 1413   HDL 55.80 06/29/2023 1413   CHOLHDL 3 06/29/2023 1413   VLDL 19.8 06/29/2023 1413   LDLCALC 68 06/29/2023 1413   LDLDIRECT 116.6 05/20/2007 0846    Physical Exam:    VS:  BP 112/60 (BP Location: Left Arm, Patient Position: Sitting, Cuff Size: Normal)   Pulse 68   Ht 5\' 10"  (1.778 m)   Wt 211 lb 12.8 oz (96.1 kg)   SpO2 96%   BMI 30.39 kg/m     Wt Readings from Last 3 Encounters:  01/27/24 211 lb 12.8 oz (96.1 kg)  01/21/24 212 lb (96.2 kg)  12/28/23 211 lb (95.7 kg)     GEN:  Well nourished, well developed in no acute distress HEENT: Normal NECK: No JVD; No carotid bruits LYMPHATICS: No lymphadenopathy CARDIAC: RRR, no murmurs, rubs, gallops RESPIRATORY:  Clear to auscultation without rales, wheezing or rhonchi  ABDOMEN: Soft, non-tender, non-distended MUSCULOSKELETAL:  No edema; No deformity  SKIN: Warm and  dry NEUROLOGIC:  Alert and oriented x 3 PSYCHIATRIC:  Normal affect   ASSESSMENT:    1. Coronary artery disease involving native coronary artery of native heart without angina pectoris   2. SOB (shortness of breath)   3. Frequent PVCs   4. Essential hypertension   5. Hyperlipidemia, unspecified hyperlipidemia type   6. Tobacco use    PLAN:    CAD: Calcium score 11/09/2019 was 245 (90th percentile).  He is denying chest pain but reporting dyspnea on exertion, which could  represent anginal equivalent. - Recommend coronary CTA to evaluate for obstructive CAD.  Will give Lopressor 25 mg prior to study - Check echocardiogram to evaluate for structural heart disease  PVCs: In bigeminy at ED visit 07/2023.  No PVCs on EKG today.  Check Zio patch x 3 days to quantify PVC burden  Hypertension: On amlodipine -valsartan  10-320 mg daily.  Appears controlled.  Lower extremity edema: On Lasix  20 mg daily as needed but reports rarely needs to take.  Check echocardiogram as above  Hyperlipidemia: On lovastatin  20 mg daily.  LDL 68 on 06/29/23  T2DM: On insulin .  Has lost 60 lbs since starting Ozempic .  A1c 6.3% on 12/28/23  Carotid stenosis: Carotid duplex 12/2019 showed mild bilateral stenosis (1 to 39%).  Tobacco use: smokes 2 packs/day, has smoked for 40 years.  Counseled on the risk of tobacco use and cessation strongly encouraged  RTC in 3 months   Medication Adjustments/Labs and Tests Ordered: Current medicines are reviewed at length with the patient today.  Concerns regarding medicines are outlined above.  Orders Placed This Encounter  Procedures   CT CORONARY MORPH W/CTA COR W/SCORE W/CA W/CM &/OR WO/CM   Basic Metabolic Panel (BMET)   Magnesium   TSH   LONG TERM MONITOR (3-14 DAYS)   EKG 12-Lead   ECHOCARDIOGRAM COMPLETE   Meds ordered this encounter  Medications   metoprolol tartrate (LOPRESSOR) 25 MG tablet    Sig: Take 2 hours before CT scan.    Dispense:  1 tablet     Refill:  0    Patient Instructions  Medication Instructions:  Continue current medications *If you need a refill on your cardiac medications before your next appointment, please call your pharmacy*  Lab Work: BMP. MG , TSH TODAY If you have labs (blood work) drawn today and your tests are completely normal, you will receive your results only by: MyChart Message (if you have MyChart) OR A paper copy in the mail If you have any lab test that is abnormal or we need to change your treatment, we will call you to review the results.  Testing/Procedures: ZIO  ZIO XT- Long Term Monitor Instructions  Your physician has requested you wear a ZIO patch monitor for 3 days.  This is a single patch monitor. [employer] supplies one patch monitor per enrollment. Additional stickers are not available. Please do not apply patch if you will be having a Nuclear Stress Test,  Echocardiogram, Cardiac CT, MRI, or Chest Xray during the period you would be wearing the  monitor. The patch cannot be worn during these tests. You cannot remove and re-apply the  ZIO XT patch monitor.  Your ZIO patch monitor will be mailed 3 day USPS to your address on file. It may take 3-5 days  to receive your monitor after you have been enrolled.  Once you have received your monitor, please review the enclosed instructions. Your monitor  has already been registered assigning a specific monitor serial # to you.  Billing and Patient Assistance Program Information  We have supplied [employer] with any of your insurance information on file for billing purposes. [employer] offers a sliding scale Patient Assistance Program for patients that do not have  insurance, or whose insurance does not completely cover the cost of the ZIO monitor.  You must apply for the Patient Assistance Program to qualify for this discounted rate.  To apply, please call [employer] at [phone], select option 4, select option 2, ask to apply for  Patient  Assistance Program. Sanna Crystal will ask your household income, and how many people  are in your household. They will quote your out-of-pocket cost based on that information.  [employer] will also be able to set up a 27-month, interest-free payment plan if needed.  Applying the monitor   Shave hair from upper left chest.  Hold abrader disc by orange tab. Rub abrader in 40 strokes over the upper left chest as  indicated in your monitor instructions.  Clean area with 4 enclosed alcohol pads. Let dry.  Apply patch as indicated in monitor instructions. Patch will be placed under collarbone on left  side of chest with arrow pointing upward.  Rub patch adhesive wings for 2 minutes. Remove white label marked "1". Remove the white  label marked "2". Rub patch adhesive wings for 2 additional minutes.  While looking in a mirror, press and release button in center of patch. A small green light will  flash 3-4 times. This will be your only indicator that the monitor has been turned on.  Do not shower for the first 24 hours. You may shower after the first 24 hours.  Press the button if you feel a symptom. You will hear a small click. Record Date, Time and  Symptom in the Patient Logbook.  When you are ready to remove the patch, follow instructions on the last 2 pages of Patient  Logbook. Stick patch monitor onto the last page of Patient Logbook.  Place Patient Logbook in the blue and white box. Use locking tab on box and tape box closed  securely. The blue and white box has prepaid postage on it. Please place it in the mailbox as  soon as possible. Your physician should have your test results approximately 7 days after the  monitor has been mailed back to [facility].  Call [facility] Customer Care at [phone] if you have questions regarding  your ZIO XT patch monitor. Call them immediately if you see an orange light blinking on your  monitor.  If your monitor falls off in less than 4 days,  contact our Monitor department at [phone].  If your monitor becomes loose or falls off after 4 days call [employer] at [phone] for  suggestions on securing your monitor    And    Echo  Your physician has requested that you have an echocardiogram. Echocardiography is a painless test that uses sound waves to create images of your heart. It provides your doctor with information about the size and shape of your heart and how well your heart's chambers and valves are working. This procedure takes approximately one hour. There are no restrictions for this procedure. Please do NOT wear cologne, perfume, aftershave, or lotions (deodorant is allowed). Please arrive 15 minutes prior to your appointment time.  Please note: We ask at that you not bring children with you during ultrasound (echo/ vascular) testing. Due to room size and safety concerns, children are not allowed in the ultrasound rooms during exams. Our front office staff cannot provide observation of children in our lobby area while testing is being conducted. An adult accompanying a patient to their appointment will only be allowed in the ultrasound room at the discretion of the ultrasound technician under special circumstances. We apologize for any inconvenience.   Follow-Up: At [facility], you and your health needs are our priority.  As part of our continuing mission to provide you with exceptional heart care, our providers are all part of one team.  This team includes your primary Cardiologist (physician) and Advanced Practice Providers or APPs (Physician Assistants and Nurse Practitioners) who all work together to provide you with the care you need, when you need it.  Your next appointment:   3 month(s)  Provider:   Dr. Alda Amas  We recommend signing up for the patient portal called "MyChart".  Sign up information is provided on this After Visit Summary.  MyChart is used to connect with patients for Virtual Visits  (Telemedicine).  Patients are able to view lab/test results, encounter notes, upcoming appointments, etc.  Non-urgent messages can be sent to your provider as well.   To learn more about what you can do with MyChart, go to [email or web address].   Other Instructions   Your cardiac CT will be scheduled at one of the below locations:    [facility] and Vascular Tower [address]  [place], [place] [address] Opening December 28, 2023  If scheduled at [facility], please arrive at the [facility] and Children's Entrance (Entrance C2) of [facility] 30 minutes prior to test start time. You can use the FREE valet parking offered at entrance C (encouraged to control the heart rate for the test)  Proceed to the [facility] Radiology Department (first floor) to check-in and test prep.   All radiology patients and guests should use entrance C2 at [facility], accessed from [facility], even though the hospital's physical address listed is [address].    If scheduled at the Heart and Vascular Tower at [address], please enter the parking lot using the [address] entrance and use the FREE valet service at the patient drop-off area. Enter the buidling and check-in with registration on the main floor.   arrive 30 minutes early for check-in and test prep.  Please follow these instructions carefully (unless otherwise directed):  An IV will be required for this test and Nitroglycerin will be given.  Hold all erectile dysfunction medications at least 3 days (72 hrs) prior to test. (Ie viagra, cialis, sildenafil, tadalafil, etc)   On the Night Before the Test: Be sure to Drink plenty of water. Do not consume any caffeinated/decaffeinated beverages or chocolate 12 hours prior to your test. Do not take any antihistamines 12 hours prior to your test.  On the Day of the Test: Drink plenty of water until 1 hour prior to the test. Do not  eat any food 1 hour prior to test. You may take your regular medications prior to the test.  Take metoprolol (Lopressor) 25 mg two hours prior to test. If you take Furosemide /Hydrochlorothiazide/Spironolactone/Chlorthalidone, please HOLD on the morning of the test. Patients who wear a continuous glucose monitor MUST remove the device prior to scanning.   *For Clinical Staff only. Please instruct patient the following:*  After the Test: Drink plenty of water. After receiving IV contrast, you may experience a mild flushed feeling. This is normal. On occasion, you may experience a mild rash up to 24 hours after the test. This is not dangerous. If this occurs, you can take Benadryl 25 mg, Zyrtec, Claritin, or Allegra and increase your fluid intake. (Patients taking Tikosyn should avoid Benadryl, and may take Zyrtec, Claritin, or Allegra) If you experience trouble breathing, this can be serious. If it is severe call [phone] IMMEDIATELY. If it is mild, please call our office.  We will call to schedule your test 2-4 weeks out understanding that some insurance companies will need an authorization  prior to the service being performed.   For more information and frequently asked questions, please visit our website : [email or web address]  For non-scheduling related questions, please contact the cardiac imaging nurse navigator should you have any questions/concerns: Cardiac Imaging Nurse Navigators Direct Office Dial: [phone]   For scheduling needs, including cancellations and rescheduling, please call [place], [phone].        Signed, Wendie Hamburg, MD  01/27/2024 5:51 PM    [facility]

## 2024-01-27 ENCOUNTER — Encounter: Payer: Self-pay | Admitting: Cardiology

## 2024-01-27 ENCOUNTER — Ambulatory Visit: Attending: Cardiology | Admitting: Cardiology

## 2024-01-27 ENCOUNTER — Ambulatory Visit: Attending: Cardiology

## 2024-01-27 VITALS — BP 112/60 | HR 68 | Ht 70.0 in | Wt 211.8 lb

## 2024-01-27 DIAGNOSIS — I10 Essential (primary) hypertension: Secondary | ICD-10-CM | POA: Diagnosis not present

## 2024-01-27 DIAGNOSIS — I493 Ventricular premature depolarization: Secondary | ICD-10-CM | POA: Diagnosis not present

## 2024-01-27 DIAGNOSIS — Z720 Tobacco use: Secondary | ICD-10-CM

## 2024-01-27 DIAGNOSIS — R0602 Shortness of breath: Secondary | ICD-10-CM | POA: Diagnosis not present

## 2024-01-27 DIAGNOSIS — I2510 Atherosclerotic heart disease of native coronary artery without angina pectoris: Secondary | ICD-10-CM | POA: Diagnosis not present

## 2024-01-27 DIAGNOSIS — E785 Hyperlipidemia, unspecified: Secondary | ICD-10-CM

## 2024-01-27 MED ORDER — METOPROLOL TARTRATE 25 MG PO TABS: ORAL_TABLET | ORAL | 0 refills | Status: AC

## 2024-01-27 NOTE — Progress Notes (Unsigned)
 Enrolled for [employer] to mail a ZIO XT long term holter monitor to the patients address on file.

## 2024-01-27 NOTE — Patient Instructions (Addendum)
 Medication Instructions:  Continue current medications *If you need a refill on your cardiac medications before your next appointment, please call your pharmacy*  Lab Work: BMP. MG , TSH TODAY If you have labs (blood work) drawn today and your tests are completely normal, you will receive your results only by: MyChart Message (if you have MyChart) OR A paper copy in the mail If you have any lab test that is abnormal or we need to change your treatment, we will call you to review the results.  Testing/Procedures: ZIO  ZIO XT- Long Term Monitor Instructions  Your physician has requested you wear a ZIO patch monitor for 3 days.  This is a single patch monitor. [employer] supplies one patch monitor per enrollment. Additional stickers are not available. Please do not apply patch if you will be having a Nuclear Stress Test,  Echocardiogram, Cardiac CT, MRI, or Chest Xray during the period you would be wearing the  monitor. The patch cannot be worn during these tests. You cannot remove and re-apply the  ZIO XT patch monitor.  Your ZIO patch monitor will be mailed 3 day USPS to your address on file. It may take 3-5 days  to receive your monitor after you have been enrolled.  Once you have received your monitor, please review the enclosed instructions. Your monitor  has already been registered assigning a specific monitor serial # to you.  Billing and Patient Assistance Program Information  We have supplied [employer] with any of your insurance information on file for billing purposes. [employer] offers a sliding scale Patient Assistance Program for patients that do not have  insurance, or whose insurance does not completely cover the cost of the ZIO monitor.  You must apply for the Patient Assistance Program to qualify for this discounted rate.  To apply, please call [employer] at [phone], select option 4, select option 2, ask to apply for  Patient Assistance Program. Sanna Crystal will ask your  household income, and how many people  are in your household. They will quote your out-of-pocket cost based on that information.  [employer] will also be able to set up a 71-month, interest-free payment plan if needed.  Applying the monitor   Shave hair from upper left chest.  Hold abrader disc by orange tab. Rub abrader in 40 strokes over the upper left chest as  indicated in your monitor instructions.  Clean area with 4 enclosed alcohol pads. Let dry.  Apply patch as indicated in monitor instructions. Patch will be placed under collarbone on left  side of chest with arrow pointing upward.  Rub patch adhesive wings for 2 minutes. Remove white label marked "1". Remove the white  label marked "2". Rub patch adhesive wings for 2 additional minutes.  While looking in a mirror, press and release button in center of patch. A small green light will  flash 3-4 times. This will be your only indicator that the monitor has been turned on.  Do not shower for the first 24 hours. You may shower after the first 24 hours.  Press the button if you feel a symptom. You will hear a small click. Record Date, Time and  Symptom in the Patient Logbook.  When you are ready to remove the patch, follow instructions on the last 2 pages of Patient  Logbook. Stick patch monitor onto the last page of Patient Logbook.  Place Patient Logbook in the blue and white box. Use locking tab on box and tape box closed  securely. The  blue and white box has prepaid postage on it. Please place it in the mailbox as  soon as possible. Your physician should have your test results approximately 7 days after the  monitor has been mailed back to [facility].  Call [facility] Customer Care at [phone] if you have questions regarding  your ZIO XT patch monitor. Call them immediately if you see an orange light blinking on your  monitor.  If your monitor falls off in less than 4 days, contact our Monitor department at [phone].   If your monitor becomes loose or falls off after 4 days call [employer] at [phone] for  suggestions on securing your monitor    And    Echo  Your physician has requested that you have an echocardiogram. Echocardiography is a painless test that uses sound waves to create images of your heart. It provides your doctor with information about the size and shape of your heart and how well your heart's chambers and valves are working. This procedure takes approximately one hour. There are no restrictions for this procedure. Please do NOT wear cologne, perfume, aftershave, or lotions (deodorant is allowed). Please arrive 15 minutes prior to your appointment time.  Please note: We ask at that you not bring children with you during ultrasound (echo/ vascular) testing. Due to room size and safety concerns, children are not allowed in the ultrasound rooms during exams. Our front office staff cannot provide observation of children in our lobby area while testing is being conducted. An adult accompanying a patient to their appointment will only be allowed in the ultrasound room at the discretion of the ultrasound technician under special circumstances. We apologize for any inconvenience.   Follow-Up: At [facility], you and your health needs are our priority.  As part of our continuing mission to provide you with exceptional heart care, our providers are all part of one team.  This team includes your primary Cardiologist (physician) and Advanced Practice Providers or APPs (Physician Assistants and Nurse Practitioners) who all work together to provide you with the care you need, when you need it.  Your next appointment:   3 month(s)  Provider:   Dr. Alda Amas  We recommend signing up for the patient portal called "MyChart".  Sign up information is provided on this After Visit Summary.  MyChart is used to connect with patients for Virtual Visits (Telemedicine).  Patients are able to view  lab/test results, encounter notes, upcoming appointments, etc.  Non-urgent messages can be sent to your provider as well.   To learn more about what you can do with MyChart, go to [email or web address].   Other Instructions   Your cardiac CT will be scheduled at one of the below locations:    Jeralene Mom. [facility] and Vascular Tower [address]  [place], [place] [address] Opening December 28, 2023  If scheduled at [facility], please arrive at the [facility] and Children's Entrance (Entrance C2) of [facility] 30 minutes prior to test start time. You can use the FREE valet parking offered at entrance C (encouraged to control the heart rate for the test)  Proceed to the [facility] Radiology Department (first floor) to check-in and test prep.   All radiology patients and guests should use entrance C2 at [facility], accessed from [facility], even though the hospital's physical address listed is [address].    If scheduled at the Heart and Vascular Tower at [address], please enter the  parking lot using the [address] entrance and use the FREE valet service at the patient drop-off area. Enter the buidling and check-in with registration on the main floor.   arrive 30 minutes early for check-in and test prep.  Please follow these instructions carefully (unless otherwise directed):  An IV will be required for this test and Nitroglycerin will be given.  Hold all erectile dysfunction medications at least 3 days (72 hrs) prior to test. (Ie viagra, cialis, sildenafil, tadalafil, etc)   On the Night Before the Test: Be sure to Drink plenty of water. Do not consume any caffeinated/decaffeinated beverages or chocolate 12 hours prior to your test. Do not take any antihistamines 12 hours prior to your test.  On the Day of the Test: Drink plenty of water until 1 hour prior to the test. Do not eat any food 1 hour prior to test. You may  take your regular medications prior to the test.  Take metoprolol (Lopressor) 25 mg two hours prior to test. If you take Furosemide /Hydrochlorothiazide/Spironolactone/Chlorthalidone, please HOLD on the morning of the test. Patients who wear a continuous glucose monitor MUST remove the device prior to scanning.   *For Clinical Staff only. Please instruct patient the following:*  After the Test: Drink plenty of water. After receiving IV contrast, you may experience a mild flushed feeling. This is normal. On occasion, you may experience a mild rash up to 24 hours after the test. This is not dangerous. If this occurs, you can take Benadryl 25 mg, Zyrtec, Claritin, or Allegra and increase your fluid intake. (Patients taking Tikosyn should avoid Benadryl, and may take Zyrtec, Claritin, or Allegra) If you experience trouble breathing, this can be serious. If it is severe call 911 IMMEDIATELY. If it is mild, please call our office.  We will call to schedule your test 2-4 weeks out understanding that some insurance companies will need an authorization prior to the service being performed.   For more information and frequently asked questions, please visit our website : [email or web address]  For non-scheduling related questions, please contact the cardiac imaging nurse navigator should you have any questions/concerns: Cardiac Imaging Nurse Navigators Direct Office Dial: [phone]   For scheduling needs, including cancellations and rescheduling, please call [place], [phone].

## 2024-01-28 ENCOUNTER — Ambulatory Visit: Payer: Self-pay | Admitting: Cardiology

## 2024-01-28 LAB — BASIC METABOLIC PANEL WITH GFR
BUN/Creatinine Ratio: 11 (ref 9–20)
BUN: 11 mg/dL (ref 6–24)
CO2: 24 mmol/L (ref 20–29)
Calcium: 9.5 mg/dL (ref 8.7–10.2)
Chloride: 95 mmol/L — ABNORMAL LOW (ref 96–106)
Creatinine, Ser: 0.96 mg/dL (ref 0.76–1.27)
Glucose: 91 mg/dL (ref 70–99)
Potassium: 4.5 mmol/L (ref 3.5–5.2)
Sodium: 135 mmol/L (ref 134–144)
eGFR: 91 mL/min/{1.73_m2} (ref >59)

## 2024-01-28 LAB — MAGNESIUM: Magnesium: 2.1 mg/dL (ref 1.6–2.3)

## 2024-01-28 LAB — TSH: TSH: 4.41 u[IU]/mL (ref 0.450–4.500)

## 2024-02-16 DIAGNOSIS — I493 Ventricular premature depolarization: Secondary | ICD-10-CM | POA: Diagnosis not present

## 2024-02-20 ENCOUNTER — Other Ambulatory Visit: Payer: Self-pay | Admitting: Internal Medicine

## 2024-02-23 ENCOUNTER — Ambulatory Visit: Admitting: Gastroenterology

## 2024-02-23 ENCOUNTER — Encounter: Payer: Self-pay | Admitting: Gastroenterology

## 2024-02-23 VITALS — BP 150/80 | HR 69 | Ht 70.0 in | Wt 216.0 lb

## 2024-02-23 DIAGNOSIS — Z1211 Encounter for screening for malignant neoplasm of colon: Secondary | ICD-10-CM

## 2024-02-23 DIAGNOSIS — R1310 Dysphagia, unspecified: Secondary | ICD-10-CM | POA: Diagnosis not present

## 2024-02-23 DIAGNOSIS — R1319 Other dysphagia: Secondary | ICD-10-CM

## 2024-02-23 MED ORDER — METOCLOPRAMIDE HCL 5 MG PO TABS: 5.0000 mg | ORAL_TABLET | Freq: Two times a day (BID) | ORAL | 0 refills | Status: AC | PRN

## 2024-02-23 MED ORDER — PEG 3350-KCL-NA BICARB-NACL 420 G PO SOLR: 4000.0000 mL | Freq: Once | ORAL | 0 refills | Status: AC

## 2024-02-23 NOTE — Patient Instructions (Signed)
 We have sent the following medications to your pharmacy for you to pick up at your convenience: Reglan - take 1 tablet 30 minutes before drinking colonoscopy prep.    You have been scheduled for an endoscopy and colonoscopy. Please follow the written instructions given to you at your visit today.  If you use inhalers (even only as needed), please bring them with you on the day of your procedure.  DO NOT TAKE 7 DAYS PRIOR TO TEST- Trulicity (dulaglutide) Ozempic , Wegovy  (semaglutide ) Mounjaro (tirzepatide) Bydureon Bcise (exanatide extended release)  DO NOT TAKE 1 DAY PRIOR TO YOUR TEST Rybelsus  (semaglutide ) Adlyxin (lixisenatide) Victoza (liraglutide) Byetta (exanatide) ___________________________________________________________________________   Thank you for trusting me with your gastrointestinal care!    Dr. Victory Legrand [facility]

## 2024-02-23 NOTE — Progress Notes (Signed)
 [facility] Consult Note:  History: [name] [name] 02/23/2024  Referring provider: Garald Karlynn GAILS, MD  Reason for consult/chief complaint: Dysphagia (Occ gets choked on food or meds. Onset x a year or so. )   Subjective  Prior history:  Saw Dr. Teressa for endoscopic testing in 2007 for abdominal pain.  Colonoscopy report reviewed, unrevealing exam.  He references a previously normal EGD report. Patient was on chronic OxyContin at the time, leading Dr. Teressa to suspect maybe opioid induced dysmotility.  History of Present Illness   [name] [name] is a pleasant 58 year old man accompanied today by his wife and here to see me today for intermittent dysphagia.  He describes feelings of food and sometimes pills feeling stuck in the neck but that the problem comes and goes.  His appetite has remained good and his weight stable.  He denies nausea or vomiting.  Typically has a BM every day or 2 and lately has started taking some stool softeners since he began a GLP-1 agonist. He denies rectal bleeding and has no known family history of colorectal cancer.  He thought he had probably had his last colonoscopy about 10 years ago, and was surprised to learn that it was as far back as 2007 as noted above.  ROS:  Review of Systems  Constitutional:  Negative for appetite change and unexpected weight change.  HENT:  Negative for mouth sores and voice change.   Eyes:  Negative for pain and redness.  Respiratory:  Negative for cough and shortness of breath.   Cardiovascular:  Negative for chest pain and palpitations.  Genitourinary:  Negative for dysuria and hematuria.  Musculoskeletal:  Positive for arthralgias and myalgias.  Skin:  Negative for pallor and rash.  Neurological:  Negative for weakness and headaches.  Hematological:  Negative for adenopathy.     Past Medical History: Past Medical History:  Diagnosis Date   Arrhythmia    Arthritis    COPD (chronic  obstructive pulmonary disease) (HCC)    Diabetes mellitus    GERD (gastroesophageal reflux disease)    Hyperlipidemia    Hypertension    Lower back pain    Nephrolithiasis    Dr Watt   Obesity    Cardiology office note 01/27/2024 reviewed.  History of coronary artery disease, patient reporting dyspnea on exertion Plans for cardiac CTA and echocardiogram (currently scheduled for June 26 and July 9, respectively) Had a Zio patch monitor and sent back the test (no result yet)  This testing was being done because the patient complained of shortness of breath without chest pain, and cardiology wants to be sure it is not an anginal equivalent.  Past Surgical History: Past Surgical History:  Procedure Laterality Date   colonscopy     KNEE SURGERY Left    scope     Family History: Family History  Problem Relation Age of Onset   Hypertension Mother    Diabetes Mother    CAD Mother    Vision loss Sister    Stomach cancer Maternal Grandfather    Heart disease Other        CAD male relative <60   Esophageal cancer Neg Hx    Colon cancer Neg Hx     Social History: Social History   Socioeconomic History   Marital status: Married    Spouse name: Barnie   Number of children: 2   Years of education: Not on file   Highest education level: Not on file  Occupational History  Not on file  Tobacco Use   Smoking status: Every Day    Current packs/day: 2.00    Types: Cigarettes   Smokeless tobacco: Never  Vaping Use   Vaping status: Never Used  Substance and Sexual Activity   Alcohol use: Never   Drug use: No   Sexual activity: Yes  Other Topics Concern   Not on file  Social History Narrative   No regular exercise   Social Drivers of Health   Financial Resource Strain: Not on file  Food Insecurity: Not on file  Transportation Needs: Not on file  Physical Activity: Not on file  Stress: Not on file  Social Connections: Not on file    Allergies: Allergies   Allergen Reactions   Belviq [Lorcaserin  Hcl]     wierd   Benazepril Hcl     REACTION: cough   Nsaids     Can take short term Upset stomach    Outpatient Meds: Current Outpatient Medications  Medication Sig Dispense Refill   amLODipine -valsartan  (EXFORGE ) 10-320 MG tablet Take 1 tablet by mouth daily. 90 tablet 2   ANORO ELLIPTA  62.5-25 MCG/ACT AEPB INHALE ONE PUFF BY MOUTH DAILY 60 each 5   aspirin EC 81 MG tablet Take 81 mg by mouth daily.     Cholecalciferol (VITAMIN D3) 50 MCG (2000 UT) capsule Take 1 capsule (2,000 Units total) by mouth daily. 100 capsule 3   Cyanocobalamin  (VITAMIN B-12) 1000 MCG SUBL Place 1 tablet (1,000 mcg total) under the tongue daily. 100 tablet 3   empagliflozin  (JARDIANCE ) 25 MG TABS tablet Take 1 tablet (25 mg total) by mouth daily before breakfast. 90 tablet 3   fluticasone  (FLONASE ) 50 MCG/ACT nasal spray SPRAY 2 SPRAYS INTO EACH NOSTRIL EVERY DAY 48 mL 1   furosemide  (LASIX ) 20 MG tablet TAKE 1 OR 2 TABLETS BY MOUTH DAILY AS NEEDED 60 tablet 3   insulin  glargine, 2 Unit Dial, (TOUJEO  MAX SOLOSTAR) 300 UNIT/ML Solostar Pen Inject 40 Units into the skin every morning. And pen needles 1/day. 15 mL 3   lovastatin  (MEVACOR ) 20 MG tablet Take 1 tablet (20 mg total) by mouth daily. 90 tablet 3   metFORMIN  (GLUCOPHAGE -XR) 500 MG 24 hr tablet Take 1 tablet (500 mg total) by mouth daily in the afternoon. 90 tablet 3   metoprolol  tartrate (LOPRESSOR ) 25 MG tablet Take 2 hours before CT scan. 1 tablet 0   morphine  (MSIR) 15 MG tablet Take 1 tablet (15 mg total) by mouth 2 (two) times daily as needed for severe pain (pain score 7-10). 60 tablet 0   Morphine  Sulfate ER 200 MG TBCR Take 200 mg by mouth 3 (three) times daily. 90 tablet 0   naloxone  (NARCAN ) nasal spray 4 mg/0.1 mL 1 actuation in one nostril once. May repeat in 2-3 min 1 each 2   ONETOUCH ULTRA test strip USE TO TEST BLOOD SUGAR TWICE DAILY 100 strip 2   pantoprazole  (PROTONIX ) 40 MG tablet Take 1  tablet (40 mg total) by mouth 2 (two) times daily. 60 tablet 11   polyethylene glycol-electrolytes (NULYTELY) 420 g solution Take 4,000 mLs by mouth once for 1 dose. 4000 mL 0   Semaglutide , 2 MG/DOSE, 8 MG/3ML SOPN Inject 2 mg as directed once a week. 9 mL 3   No current facility-administered medications for this visit.      ___________________________________________________________________ Objective   Exam:  BP (!) 150/80   Pulse 69   Ht 5' 10 (1.778 m)  Wt 216 lb (98 kg)   BMI 30.99 kg/m  Wt Readings from Last 3 Encounters:  02/23/24 216 lb (98 kg)  01/27/24 211 lb 12.8 oz (96.1 kg)  01/21/24 212 lb (96.2 kg)    General: Smells strongly of cigarettes. Ambulatory, gets on exam table without difficulty, breathing comfortably on room air, no dysphonia Eyes: sclera anicteric, no redness ENT: oral mucosa moist without lesions, no cervical or supraclavicular lymphadenopathy.  Upper dentures.  Bottom teeth in poor repair, nothing loose CV: Regular without appreciable murmur, no JVD, no peripheral edema Resp: clear to auscultation bilaterally, normal RR and effort noted GI: soft, mild wheezing tenderness, with active bowel sounds. No guarding or palpable organomegaly noted. Skin; warm and dry, no rash or jaundice noted Neuro: awake, alert and oriented x 3. Normal gross motor function and fluent speech   Labs:     Latest Ref Rng & Units 07/17/2023    5:38 PM 01/21/2022    3:57 PM 05/02/2021    2:43 PM  CBC  WBC 4.0 - 10.5 K/uL 9.7  11.0  8.9   Hemoglobin 13.0 - 17.0 g/dL 83.2  85.5  84.8   Hematocrit 39.0 - 52.0 % 48.1  43.2  45.0   Platelets 150 - 400 K/uL 229  186.0  261.0       Latest Ref Rng & Units 01/27/2024    3:03 PM 07/17/2023    5:38 PM 06/29/2023    2:13 PM  CMP  Glucose 70 - 99 mg/dL 91  857  861   BUN 6 - 24 mg/dL 11  10  14    Creatinine 0.76 - 1.27 mg/dL 9.03  9.14  9.16   Sodium 134 - 144 mmol/L 135  131  132   Potassium 3.5 - 5.2 mmol/L 4.5  4.3   4.0   Chloride 96 - 106 mmol/L 95  96  94   CO2 20 - 29 mmol/L 24  26  27    Calcium 8.7 - 10.2 mg/dL 9.5  9.5  9.9       Encounter Diagnoses  Name Primary?   Esophageal dysphagia Yes   Special screening for malignant neoplasms, colon     Assessment & Plan   His dysphagia sounds most like cricopharyngeal dysfunction given its description and intermittent nature.  Less likely web, stricture or mass or other fixed mechanical cause. Smoking cessation recommended  Overdue for colorectal cancer screening, average risk.  Recommended EGD and colonoscopy for evaluation of dysphagia and for colorectal cancer screening.  He was agreeable after discussion of procedure and risks.  The benefits and risks of the planned procedure(s) were described in detail with the patient or (when appropriate) their health care proxy.  Risks were outlined as including, but not limited to, bleeding, infection, perforation, adverse medication reaction leading to cardiac or pulmonary decompensation, pancreatitis (if ERCP).  The limitation of incomplete mucosal visualization was also discussed.  No guarantees or warranties were given.  Patient at increased risk for cardiopulmonary complications of procedure due to medical comorbidities.  Split dose GoLytely bowel preparation because of his constipation and 2 doses of Reglan 5 mg to help him tolerate the prep. Hold GLP-1 agonist at least 1 week before the procedure.  We have scheduled the procedures 4 to 6 weeks from now to allow time for his completed cardiac testing.    Thank you for the courtesy of this consult.  Please call me with any questions or concerns.  Victory LITTIE Brand III  CC: Referring provider noted above

## 2024-02-24 ENCOUNTER — Telehealth (HOSPITAL_COMMUNITY): Payer: Self-pay | Admitting: Emergency Medicine

## 2024-02-24 NOTE — Telephone Encounter (Signed)
 Reaching out to patient to offer assistance regarding upcoming cardiac imaging study; pt verbalizes understanding of appt date/time, parking situation and where to check in, pre-test NPO status and medications ordered, and verified current allergies; name and call back number provided for further questions should they arise Rockwell Alexandria RN Navigator Cardiac Imaging [facility] [phone] office [phone] cell

## 2024-02-25 ENCOUNTER — Ambulatory Visit (HOSPITAL_COMMUNITY)
Admission: RE | Admit: 2024-02-25 | Discharge: 2024-02-25 | Disposition: A | Discharge status: Home / Self Care | Source: Ambulatory Visit | Attending: Cardiology | Admitting: Cardiology

## 2024-02-25 DIAGNOSIS — I3481 Nonrheumatic mitral (valve) annulus calcification: Secondary | ICD-10-CM | POA: Insufficient documentation

## 2024-02-25 DIAGNOSIS — R079 Chest pain, unspecified: Secondary | ICD-10-CM | POA: Insufficient documentation

## 2024-02-25 DIAGNOSIS — R0602 Shortness of breath: Secondary | ICD-10-CM | POA: Insufficient documentation

## 2024-02-25 DIAGNOSIS — I2510 Atherosclerotic heart disease of native coronary artery without angina pectoris: Secondary | ICD-10-CM | POA: Insufficient documentation

## 2024-02-25 DIAGNOSIS — I700 Atherosclerosis of aorta: Secondary | ICD-10-CM | POA: Diagnosis not present

## 2024-02-25 MED ORDER — IOHEXOL 350 MG/ML SOLN
100.0000 mL | Freq: Once | INTRAVENOUS | Status: AC | PRN
  Administered 2024-02-25: 100 mL via INTRAVENOUS

## 2024-02-25 MED ORDER — NITROGLYCERIN 0.4 MG SL SUBL
0.8000 mg | SUBLINGUAL_TABLET | Freq: Once | SUBLINGUAL | Status: AC
  Administered 2024-02-25: 0.8 mg via SUBLINGUAL

## 2024-03-09 ENCOUNTER — Ambulatory Visit (HOSPITAL_COMMUNITY)
Admission: RE | Admit: 2024-03-09 | Discharge: 2024-03-09 | Disposition: A | Discharge status: Home / Self Care | Source: Ambulatory Visit | Attending: Cardiovascular Disease | Admitting: Cardiovascular Disease

## 2024-03-09 DIAGNOSIS — R0602 Shortness of breath: Secondary | ICD-10-CM | POA: Insufficient documentation

## 2024-03-09 LAB — ECHOCARDIOGRAM COMPLETE
Area-P 1/2: 2.39 cm2
S' Lateral: 3.83 cm

## 2024-03-11 ENCOUNTER — Telehealth: Payer: Self-pay | Admitting: Cardiology

## 2024-03-11 NOTE — Telephone Encounter (Signed)
 Patient returned RN's call regarding results and stated can also leave message with wife.

## 2024-03-11 NOTE — Telephone Encounter (Signed)
 Left message for patient with results from his echocardiogram. Advised to call back with any questions or concerns  Lonni LITTIE Nanas, MD 03/10/2024  8:33 AM EDT     No significant abnormalities

## 2024-03-26 DIAGNOSIS — I493 Ventricular premature depolarization: Secondary | ICD-10-CM

## 2024-03-28 MED ORDER — METOPROLOL TARTRATE 25 MG PO TABS: 25.0000 mg | ORAL_TABLET | Freq: Two times a day (BID) | ORAL | 3 refills | Status: AC

## 2024-03-30 ENCOUNTER — Encounter: Payer: Self-pay | Admitting: Gastroenterology

## 2024-04-07 ENCOUNTER — Encounter: Admitting: Gastroenterology

## 2024-04-07 ENCOUNTER — Telehealth: Payer: Self-pay | Admitting: Gastroenterology

## 2024-04-07 NOTE — Telephone Encounter (Signed)
 No show fee for wasted EGD/colonoscopy slot.  One chance to reschedule  - HD

## 2024-04-07 NOTE — Telephone Encounter (Signed)
 Good Morning Dr. Legrand,  I called this patient this morning at 9:50 am. The cell phone number rang and went to the busy signal.  I called the home phone and left a message to call us  if he was running late or needed to reschedule.   I will NO SHOW patient.  [employer]

## 2024-04-20 ENCOUNTER — Ambulatory Visit: Admitting: Internal Medicine

## 2024-04-20 ENCOUNTER — Encounter: Payer: Self-pay | Admitting: Internal Medicine

## 2024-04-20 VITALS — BP 136/83 | HR 43 | Temp 97.5°F | Ht 70.0 in | Wt 220.0 lb

## 2024-04-20 DIAGNOSIS — I2583 Coronary atherosclerosis due to lipid rich plaque: Secondary | ICD-10-CM

## 2024-04-20 DIAGNOSIS — M545 Low back pain, unspecified: Secondary | ICD-10-CM

## 2024-04-20 DIAGNOSIS — E1142 Type 2 diabetes mellitus with diabetic polyneuropathy: Secondary | ICD-10-CM

## 2024-04-20 DIAGNOSIS — M79605 Pain in left leg: Secondary | ICD-10-CM

## 2024-04-20 DIAGNOSIS — I10 Essential (primary) hypertension: Secondary | ICD-10-CM | POA: Diagnosis not present

## 2024-04-20 DIAGNOSIS — E538 Deficiency of other specified B group vitamins: Secondary | ICD-10-CM | POA: Diagnosis not present

## 2024-04-20 DIAGNOSIS — I2510 Atherosclerotic heart disease of native coronary artery without angina pectoris: Secondary | ICD-10-CM

## 2024-04-20 DIAGNOSIS — M79604 Pain in right leg: Secondary | ICD-10-CM

## 2024-04-20 DIAGNOSIS — Z794 Long term (current) use of insulin: Secondary | ICD-10-CM

## 2024-04-20 MED ORDER — MORPHINE SULFATE 15 MG PO TABS: 15.0000 mg | ORAL_TABLET | Freq: Two times a day (BID) | ORAL | 0 refills | Status: DC | PRN

## 2024-04-20 MED ORDER — MORPHINE SULFATE ER 200 MG PO TBCR: 200.0000 mg | EXTENDED_RELEASE_TABLET | Freq: Three times a day (TID) | ORAL | 0 refills | Status: DC

## 2024-04-20 NOTE — Progress Notes (Unsigned)
 Subjective:  Patient ID: [name] [name], male    DOB: July 22, 1966  Age: 58 y.o. MRN: [id number]  CC: Medical Management of Chronic Issues (3 mnth f/u )   HPILBP [name] [name] presents for LBP, DM, HTN  Outpatient Medications Prior to Visit  Medication Sig Dispense Refill  . amLODipine -valsartan  (EXFORGE ) 10-320 MG tablet Take 1 tablet by mouth daily. 90 tablet 2  . ANORO ELLIPTA  62.5-25 MCG/ACT AEPB INHALE ONE PUFF BY MOUTH DAILY 60 each 5  . aspirin EC 81 MG tablet Take 81 mg by mouth daily.    . Cholecalciferol (VITAMIN D3) 50 MCG (2000 UT) capsule Take 1 capsule (2,000 Units total) by mouth daily. 100 capsule 3  . Cyanocobalamin  (VITAMIN B-12) 1000 MCG SUBL Place 1 tablet (1,000 mcg total) under the tongue daily. 100 tablet 3  . empagliflozin  (JARDIANCE ) 25 MG TABS tablet Take 1 tablet (25 mg total) by mouth daily before breakfast. 90 tablet 3  . fluticasone  (FLONASE ) 50 MCG/ACT nasal spray SPRAY 2 SPRAYS INTO EACH NOSTRIL EVERY DAY 48 mL 1  . furosemide  (LASIX ) 20 MG tablet TAKE 1 OR 2 TABLETS BY MOUTH DAILY AS NEEDED 60 tablet 3  . insulin  glargine, 2 Unit Dial, (TOUJEO  MAX SOLOSTAR) 300 UNIT/ML Solostar Pen Inject 40 Units into the skin every morning. And pen needles 1/day. 15 mL 3  . lovastatin  (MEVACOR ) 20 MG tablet Take 1 tablet (20 mg total) by mouth daily. 90 tablet 3  . metFORMIN  (GLUCOPHAGE -XR) 500 MG 24 hr tablet Take 1 tablet (500 mg total) by mouth daily in the afternoon. 90 tablet 3  . metoCLOPramide  (REGLAN ) 5 MG tablet Take 1 tablet (5 mg total) by mouth every 12 (twelve) hours as needed for up to 2 doses for nausea. Take 30-45 minutes before evening and AM doses of bowel preparation solution. 2 tablet 0  . metoprolol  tartrate (LOPRESSOR ) 25 MG tablet Take 2 hours before CT scan. 1 tablet 0  . metoprolol  tartrate (LOPRESSOR ) 25 MG tablet Take 1 tablet (25 mg total) by mouth 2 (two) times daily. 180 tablet 3  . naloxone  (NARCAN ) nasal spray 4 mg/0.1 mL 1 actuation in  one nostril once. May repeat in 2-3 min 1 each 2  . ONETOUCH ULTRA test strip USE TO TEST BLOOD SUGAR TWICE DAILY 100 strip 2  . pantoprazole  (PROTONIX ) 40 MG tablet Take 1 tablet (40 mg total) by mouth 2 (two) times daily. 60 tablet 11  . Semaglutide , 2 MG/DOSE, 8 MG/3ML SOPN Inject 2 mg as directed once a week. 9 mL 3  . morphine  (MSIR) 15 MG tablet Take 1 tablet (15 mg total) by mouth 2 (two) times daily as needed for severe pain (pain score 7-10). 60 tablet 0  . Morphine  Sulfate ER 200 MG TBCR Take 200 mg by mouth 3 (three) times daily. 90 tablet 0   No facility-administered medications prior to visit.    ROS: Review of Systems  Constitutional:  Positive for fatigue. Negative for appetite change and unexpected weight change.  HENT:  Negative for congestion, nosebleeds, sneezing, sore throat and trouble swallowing.   Eyes:  Negative for itching and visual disturbance.  Respiratory:  Negative for cough.   Cardiovascular:  Negative for chest pain, palpitations and leg swelling.  Gastrointestinal:  Negative for abdominal distention, blood in stool, diarrhea and nausea.  Genitourinary:  Negative for frequency and hematuria.  Musculoskeletal:  Positive for arthralgias, back pain, gait problem and neck stiffness. Negative for joint swelling and neck  pain.  Skin:  Negative for rash.  Neurological:  Negative for dizziness, tremors, speech difficulty and weakness.  Psychiatric/Behavioral:  Negative for agitation, dysphoric mood and sleep disturbance. The patient is not nervous/anxious.     Objective:  BP 136/83   Pulse (!) 43   Temp (!) 97.5 F (36.4 C) (Oral)   Ht 5' 10 (1.778 m)   Wt 220 lb (99.8 kg)   SpO2 98%   BMI 31.57 kg/m   BP Readings from Last 3 Encounters:  04/20/24 136/83  02/25/24 (!) 141/62  02/23/24 (!) 150/80    Wt Readings from Last 3 Encounters:  04/20/24 220 lb (99.8 kg)  02/23/24 216 lb (98 kg)  01/27/24 211 lb 12.8 oz (96.1 kg)    Physical  Exam Constitutional:      General: He is not in acute distress.    Appearance: Normal appearance. He is well-developed.     Comments: NAD  Eyes:     Conjunctiva/sclera: Conjunctivae normal.     Pupils: Pupils are equal, round, and reactive to light.  Neck:     Thyroid : No thyromegaly.     Vascular: No JVD.  Cardiovascular:     Rate and Rhythm: Normal rate and regular rhythm.     Heart sounds: Normal heart sounds. No murmur heard.    No friction rub. No gallop.  Pulmonary:     Effort: Pulmonary effort is normal. No respiratory distress.     Breath sounds: Normal breath sounds. No wheezing or rales.  Chest:     Chest wall: No tenderness.  Abdominal:     General: Bowel sounds are normal. There is no distension.     Palpations: Abdomen is soft. There is no mass.     Tenderness: There is no abdominal tenderness. There is no guarding or rebound.  Musculoskeletal:        General: Tenderness present. Normal range of motion.     Cervical back: Normal range of motion.     Right lower leg: No edema.     Left lower leg: No edema.  Lymphadenopathy:     Cervical: No cervical adenopathy.  Skin:    General: Skin is warm and dry.     Findings: No rash.  Neurological:     Mental Status: He is alert and oriented to person, place, and time.     Cranial Nerves: No cranial nerve deficit.     Motor: No abnormal muscle tone.     Coordination: Coordination normal.     Gait: Gait normal.     Deep Tendon Reflexes: Reflexes are normal and symmetric.  Psychiatric:        Behavior: Behavior normal.        Thought Content: Thought content normal.        Judgment: Judgment normal.   Stiff LS spine, arthritic gait  Lab Results  Component Value Date   WBC 9.7 07/17/2023   HGB 16.7 07/17/2023   HCT 48.1 07/17/2023   PLT 229 07/17/2023   GLUCOSE 91 01/27/2024   CHOL 144 06/29/2023   TRIG 99.0 06/29/2023   HDL 55.80 06/29/2023   LDLDIRECT 116.6 05/20/2007   LDLCALC 68 06/29/2023   ALT 25  01/21/2022   AST 22 01/21/2022   NA 135 01/27/2024   K 4.5 01/27/2024   CL 95 (L) 01/27/2024   CREATININE 0.96 01/27/2024   BUN 11 01/27/2024   CO2 24 01/27/2024   TSH 4.410 01/27/2024   PSA 0.20 04/23/2018  INR 1.1 05/10/2008   HGBA1C 6.3 (A) 12/28/2023    ECHOCARDIOGRAM COMPLETE Result Date: 03/09/2024    ECHOCARDIOGRAM REPORT   Patient Name:   [name] [name] Date of Exam: 03/09/2024 Medical Rec #:  [id number]        Height:       70.0 in Accession #:    [id number]       Weight:       216.0 lb Date of Birth:  19-Jan-1966       BSA:          2.157 m Patient Age:    57 years         BP:           112/60 mmHg Patient Gender: M                HR:           64 bpm. Exam Location:  [address] Procedure: 2D Echo (Both Spectral and Color Flow Doppler were utilized during            procedure). Indications:    R06.02 SOB; I33.9 SBE  History:        Patient has prior history of Echocardiogram examinations, most                 recent 03/18/2018. CAD, Abnormal ECG, COPD, Arrythmias:PVC,                 Signs/Symptoms:Chest Pain and Edema; Risk Factors:Hypertension,                 Diabetes, Dyslipidemia and Current Smoker. Shortness of breath                 with exertion. Asthmatic bronchitis.  Sonographer:    Jon Hacker RCS Referring Phys: [id number] CHRISTOPHER L SCHUMANN IMPRESSIONS  1. Left ventricular ejection fraction, by estimation, is 55 to 60%. The left ventricle has normal function. The left ventricle has no regional wall motion abnormalities. Left ventricular diastolic parameters were normal.  2. Right ventricular systolic function is normal. The right ventricular size is normal. Tricuspid regurgitation signal is inadequate for assessing PA pressure.  3. The mitral valve is degenerative. There is mild calcification of the mitral valve leaflet(s) extending into the chordae tendinae.     No evidence of mitral valve regurgitation. No evidence of mitral stenosis.  4. The aortic valve is tricuspid.  Aortic valve regurgitation is not visualized. Aortic valve sclerosis/calcification is present, without any evidence of aortic stenosis.  5. Aortic dilatation noted. There is mild dilatation of the ascending aorta, measuring 38 mm.  6. The inferior vena cava is dilated in size with >50% respiratory variability, suggesting right atrial pressure of 8 mmHg. FINDINGS  Left Ventricle: Left ventricular ejection fraction, by estimation, is 55 to 60%. The left ventricle has normal function. The left ventricle has no regional wall motion abnormalities. The left ventricular internal cavity size was normal in size. There is  no left ventricular hypertrophy. Left ventricular diastolic parameters were normal. Normal left ventricular filling pressure. Right Ventricle: The right ventricular size is normal. No increase in right ventricular wall thickness. Right ventricular systolic function is normal. Tricuspid regurgitation signal is inadequate for assessing PA pressure. Left Atrium: Left atrial size was normal in size. Right Atrium: Right atrial size was normal in size. Pericardium: There is no evidence of pericardial effusion. Mitral Valve: The mitral valve is degenerative in appearance. There is mild calcification of the mitral  valve leaflet(s). No evidence of mitral valve regurgitation. No evidence of mitral valve stenosis. Tricuspid Valve: The tricuspid valve is normal in structure. Tricuspid valve regurgitation is not demonstrated. No evidence of tricuspid stenosis. Aortic Valve: The aortic valve is tricuspid. Aortic valve regurgitation is not visualized. Aortic valve sclerosis/calcification is present, without any evidence of aortic stenosis. Pulmonic Valve: The pulmonic valve was normal in structure. Pulmonic valve regurgitation is not visualized. No evidence of pulmonic stenosis. Aorta: Aortic dilatation noted. There is mild dilatation of the ascending aorta, measuring 38 mm. Venous: The inferior vena cava is dilated in  size with greater than 50% respiratory variability, suggesting right atrial pressure of 8 mmHg. IAS/Shunts: No atrial level shunt detected by color flow Doppler.  LEFT VENTRICLE PLAX 2D LVIDd:         4.94 cm   Diastology LVIDs:         3.83 cm   LV e' medial:    11.50 cm/s LV PW:         1.02 cm   LV E/e' medial:  10.2 LV IVS:        0.87 cm   LV e' lateral:   14.90 cm/s LVOT diam:     2.10 cm   LV E/e' lateral: 7.9 LV SV:         92 LV SV Index:   43 LVOT Area:     3.46 cm  RIGHT VENTRICLE RV Basal diam:  3.06 cm RV S prime:     18.70 cm/s TAPSE (M-mode): 2.8 cm LEFT ATRIUM             Index        RIGHT ATRIUM           Index LA diam:        3.60 cm 1.67 cm/m   RA Area:     17.50 cm LA Vol (A2C):   58.0 ml 26.89 ml/m  RA Volume:   44.60 ml  20.68 ml/m LA Vol (A4C):   38.4 ml 17.80 ml/m LA Biplane Vol: 46.4 ml 21.51 ml/m  AORTIC VALVE LVOT Vmax:   123.00 cm/s LVOT Vmean:  74.700 cm/s LVOT VTI:    0.267 m  AORTA Ao Root diam: 3.70 cm Ao Asc diam:  3.80 cm MITRAL VALVE MV Area (PHT): 2.39 cm     SHUNTS MV Decel Time: 318 msec     Systemic VTI:  0.27 m MV E velocity: 117.00 cm/s  Systemic Diam: 2.10 cm MV A velocity: 124.00 cm/s MV E/A ratio:  0.94 Wilbert Bihari MD Electronically signed by Wilbert Bihari MD Signature Date/Time: 03/09/2024/3:10:04 PM    Final     Assessment & Plan:   Problem List Items Addressed This Visit   None     Meds ordered this encounter  Medications  . morphine  (MSIR) 15 MG tablet    Sig: Take 1 tablet (15 mg total) by mouth 2 (two) times daily as needed for severe pain (pain score 7-10).    Dispense:  60 tablet    Refill:  0    Fill on or after 04/20/24. Dx: M54.5  . Morphine  Sulfate ER 200 MG TBCR    Sig: Take 200 mg by mouth 3 (three) times daily.    Dispense:  90 tablet    Refill:  0    Fill on or after 04/20/24. Dx: M54.5      Follow-up: Return in about 3 months (around 07/21/2024) for a follow-up visit.  Marolyn Noel, MD

## 2024-04-21 MED ORDER — MORPHINE SULFATE ER 200 MG PO TBCR: 200.0000 mg | EXTENDED_RELEASE_TABLET | Freq: Two times a day (BID) | ORAL | 0 refills | Status: DC

## 2024-04-21 MED ORDER — MORPHINE SULFATE 15 MG PO TABS: 15.0000 mg | ORAL_TABLET | Freq: Two times a day (BID) | ORAL | 0 refills | Status: DC | PRN

## 2024-04-21 MED ORDER — MORPHINE SULFATE 15 MG PO TABS
15.0000 mg | ORAL_TABLET | Freq: Two times a day (BID) | ORAL | 0 refills | Status: DC | PRN
Start: 2024-04-21 — End: 2024-05-19

## 2024-04-21 MED ORDER — MORPHINE SULFATE ER 200 MG PO TBCR
200.0000 mg | EXTENDED_RELEASE_TABLET | Freq: Two times a day (BID) | ORAL | 0 refills | Status: DC
Start: 2024-04-21 — End: 2024-05-19

## 2024-04-21 NOTE — Assessment & Plan Note (Signed)
 Using Lovastatin  F/u w/Dr Pietro

## 2024-04-21 NOTE — Assessment & Plan Note (Signed)
 Chronic severe LBP, on opioids Continue with MSIR, MSContin   Potential benefits of a long term opioids  use as well as potential risks  and complications were explained to the patient and were aknowledged.

## 2024-04-21 NOTE — Assessment & Plan Note (Signed)
Cont w/Amlodipine-Valsartan NAS diet 

## 2024-04-21 NOTE — Assessment & Plan Note (Signed)
On B12 SL

## 2024-04-21 NOTE — Assessment & Plan Note (Signed)
 F/u w/Dr [facility] Better w/wt loss Continue with Jardiance , Toujeo , Ozempic , Metformin .  He is losing wt. On Ozempic 

## 2024-04-26 ENCOUNTER — Other Ambulatory Visit: Payer: Self-pay | Admitting: Internal Medicine

## 2024-04-26 DIAGNOSIS — E1165 Type 2 diabetes mellitus with hyperglycemia: Secondary | ICD-10-CM

## 2024-05-05 NOTE — Progress Notes (Deleted)
 Cardiology Office Note:    Date:  05/05/2024   ID:  [name] [name], DOB 1966/01/12, MRN [id number]  PCP:  [name] Karlynn GAILS, MD  Cardiologist:  None  Electrophysiologist:  None   Referring MD: [name] Karlynn GAILS, MD   No chief complaint on file.   History of Present Illness:    [name] [name] is a 58 y.o. male with a hx of COPD, hypertension, hyperlipidemia, tobacco use, diabetes who presents for follow-up.  He states in 12/2023.  Presented to ED with chest pain in 07/2023.  Workup unremarkable.  He had previously followed with Dr. Pietro, last seen in 2019.  Echocardiogram 03/2018 showed EF 60 to 65%, normal RV function, no significant valvular disease.  Lexiscan  Myoview  03/2018 showed normal perfusion, LVEF 62%.  Calcium score 11/09/2019 was 245 (90th percentile).  Carotid duplex 12/2019 showed mild bilateral stenosis (1 to 39%).  Coronary CTA 01/2024 showed mild stenosis RCA/LAD/LCx, calcium score 1110 (97th percentile).  Echocardiogram 03/2024 showed normal biventricular function, no significant valvular disease.  Zio patch x 3 days 01/2024 showed frequent PVCs (33% of beats).  Since last clinic visit,  He reports no recent chest pain, states that when he went to the ED he had a superficial lump in his chest that was likely causing his pain, resolved and pain went away.  Does report that he is short of breath with exertion.  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  He denies any lightheadedness, syncope, edema, or palpitations.  Smokes 2 packs/day, smoked for 40 years.  No known family history of heart disease.   Past Medical History:  Diagnosis Date   Arrhythmia    Arthritis    COPD (chronic obstructive pulmonary disease) (HCC)    Diabetes mellitus    GERD (gastroesophageal reflux disease)    Hyperlipidemia    Hypertension    Lower back pain    Nephrolithiasis    Dr Watt   Obesity      Past Surgical History:  Procedure Laterality Date   colonscopy     KNEE SURGERY Left    scope    Current Medications: No outpatient medications have been marked as taking for the 05/06/24 encounter (Appointment) with [name] Lonni CROME, MD.     Allergies:   Belviq [lorcaserin  hcl], Benazepril hcl, and Nsaids   Social History   Socioeconomic History   Marital status: Married    Spouse name: [name] [name]   Number of children: 2   Years of education: Not on file   Highest education level: Not on file  Occupational History   Not on file  Tobacco Use   Smoking status: Every Day    Current packs/day: 2.00    Types: Cigarettes   Smokeless tobacco: Never  Vaping Use   Vaping status: Never Used  Substance and Sexual Activity   Alcohol use: Never   Drug use: No   Sexual activity: Yes  Other Topics Concern   Not on file  Social History Narrative   No regular exercise   Social Drivers of Health   Financial Resource Strain: Not on file  Food Insecurity: Not on file  Transportation Needs: Not on file  Physical Activity: Not on file  Stress: Not on file  Social Connections: Not on file     Family History: The patient's family history includes CAD in his mother; Diabetes in his mother; Heart disease  in an other family member; Hypertension in his mother; Stomach cancer in his maternal grandfather; Vision loss in his sister. There is no history of Esophageal cancer or Colon cancer.  ROS:   Please see the history of present illness.     All other systems reviewed and are negative.  EKGs/Labs/Other Studies Reviewed:    The following studies were reviewed today:   EKG:  01/27/2024: Normal sinus rhythm, rate 68, no ST abnormalities  Recent Labs: 07/17/2023: Hemoglobin 16.7; Platelets 229 01/27/2024: BUN 11; Creatinine, Ser 0.96; Magnesium 2.1; Potassium 4.5; Sodium 135; TSH 4.410  Recent Lipid Panel    Component Value Date/Time   CHOL 144 06/29/2023 1413   TRIG 99.0  06/29/2023 1413   HDL 55.80 06/29/2023 1413   CHOLHDL 3 06/29/2023 1413   VLDL 19.8 06/29/2023 1413   LDLCALC 68 06/29/2023 1413   LDLDIRECT 116.6 05/20/2007 0846    Physical Exam:    VS:  There were no vitals taken for this visit.    Wt Readings from Last 3 Encounters:  04/20/24 220 lb (99.8 kg)  02/23/24 216 lb (98 kg)  01/27/24 211 lb 12.8 oz (96.1 kg)     GEN:  Well nourished, well developed in no acute distress HEENT: Normal NECK: No JVD; No carotid bruits LYMPHATICS: No lymphadenopathy CARDIAC: RRR, no murmurs, rubs, gallops RESPIRATORY:  Clear to auscultation without rales, wheezing or rhonchi  ABDOMEN: Soft, non-tender, non-distended MUSCULOSKELETAL:  No edema; No deformity  SKIN: Warm and dry NEUROLOGIC:  Alert and oriented x 3 PSYCHIATRIC:  Normal affect   ASSESSMENT:    No diagnosis found.  PLAN:    CAD: Calcium score 11/09/2019 was 245 (90th percentile).  He is denying chest pain but reporting dyspnea on exertion.  Coronary CTA 01/2024 showed mild stenosis RCA/LAD/LCx, calcium score 1110 (97th percentile).  Echocardiogram 03/2024 showed normal biventricular function, no significant valvular disease.   - Continue aspirin 81 mg daily - Continue lovastatin  20 mg daily  PVCs: Zio patch x 3 days 01/2024 showed frequent PVCs (33% of beats).  Continue metoprolol , will consolidate to Toprol -XL 50 mg daily  Hypertension: On amlodipine -valsartan  10-320 mg daily and metoprolol .  Appears controlled.  Lower extremity edema: On Lasix  20 mg daily as needed but reports rarely needs to take.    Hyperlipidemia: On lovastatin  20 mg daily.  LDL 68 on 06/29/23  T2DM: On insulin .  Has lost 60 lbs since starting Ozempic .  A1c 6.3% on 12/28/23  Carotid stenosis: Carotid duplex 12/2019 showed mild bilateral stenosis (1 to 39%).  Tobacco use: smokes 2 packs/day, has smoked for 40 years.  Counseled on the risk of tobacco use and cessation strongly encouraged***  RTC in 3  months***   Medication Adjustments/Labs and Tests Ordered: Current medicines are reviewed at length with the patient today.  Concerns regarding medicines are outlined above.  No orders of the defined types were placed in this encounter.  No orders of the defined types were placed in this encounter.   There are no Patient Instructions on file for this visit.   Signed, Lonni LITTIE Nanas, MD  05/05/2024 10:04 PM    [facility]

## 2024-05-06 ENCOUNTER — Ambulatory Visit: Attending: Cardiology | Admitting: Cardiology

## 2024-05-19 ENCOUNTER — Telehealth: Payer: Self-pay | Admitting: Radiology

## 2024-05-19 MED ORDER — MORPHINE SULFATE ER 200 MG PO TBCR: 200.0000 mg | EXTENDED_RELEASE_TABLET | Freq: Two times a day (BID) | ORAL | 0 refills | Status: DC

## 2024-05-19 MED ORDER — MORPHINE SULFATE 15 MG PO TABS: 15.0000 mg | ORAL_TABLET | Freq: Two times a day (BID) | ORAL | 0 refills | Status: DC | PRN

## 2024-05-19 NOTE — Telephone Encounter (Signed)
 Copied from CRM [id number]. Topic: Clinical - Prescription Issue >> May 19, 2024 10:13 AM Charolett L wrote: Reason for CRM: [facility] that Morphine  Sulfate ER 200 MG TBCR has a note to Do not refill until  09/29 but patient will be out of medication 09/19. Pharmacy needs a call back to verify the refill date Cb# [phone]

## 2024-05-19 NOTE — Telephone Encounter (Signed)
Corrected. thx

## 2024-05-19 NOTE — Addendum Note (Signed)
 Addended by: Akio Hudnall V on: 05/19/2024 05:23 PM   Modules accepted: Orders

## 2024-06-02 NOTE — Telephone Encounter (Unsigned)
 Copied from CRM [id number]. Topic: Clinical - Prescription Issue >> Jun 02, 2024  3:24 PM Tysheama G wrote: Reason for CRM: Patient wife is calling regarding medication Morphine  Sulfate ER 200 MG TBCR. They picked up the prescription on 9/18 and went on vacation and husband didn't look at the bottle at the time till he got home. He's supposed to take it 3x a day but it was listed as 2x a day. He received a 60 day supply instead of the 90 day supply so they will need that corrected. Callback number [phone]

## 2024-06-06 ENCOUNTER — Telehealth: Payer: Self-pay

## 2024-06-06 NOTE — Telephone Encounter (Signed)
 Copied from CRM [id number]. Topic: Clinical - Medication Question >> Jun 06, 2024  1:17 PM Viola F wrote: Reason for CRM: Patient says the morphine  (MSIR) 15 MG tablet is suppose 90 instead of 60 tablets. He requested that Dr. Garald update the tablets to 90 at the pharmacy on file

## 2024-06-08 ENCOUNTER — Telehealth: Payer: Self-pay

## 2024-06-08 NOTE — Telephone Encounter (Signed)
 Copied from CRM [id number]. Topic: Clinical - Prescription Issue >> Jun 08, 2024 10:32 AM Rea ORN wrote: Reason for CRM: pt wife Barnie called to follow up on multiple CRM's regarding pts' Morphine . Barnie stated the pt takes it three times a day but a rx for 2 times a day was sent to the pharmacy. Please call back.

## 2024-06-08 NOTE — Telephone Encounter (Signed)
 Being addressed in separate note. Awaiting PCP response

## 2024-06-09 ENCOUNTER — Telehealth: Payer: Self-pay | Admitting: Internal Medicine

## 2024-06-09 ENCOUNTER — Other Ambulatory Visit: Payer: Self-pay | Admitting: Internal Medicine

## 2024-06-09 MED ORDER — MORPHINE SULFATE ER 200 MG PO TBCR: 200.0000 mg | EXTENDED_RELEASE_TABLET | Freq: Three times a day (TID) | ORAL | 0 refills | Status: DC

## 2024-06-09 NOTE — Telephone Encounter (Unsigned)
 Copied from CRM [id number]. Topic: Clinical - Prescription Issue >> Jun 09, 2024 10:01 AM Alfonso HERO wrote: Reason for CRM: Morphine  Sulfate ER 200 MG TBCR - Rx sent today says fill 10/19 when it is actually due to be refilled today but the original Rx was sent in incorrectly. Can you please call and give a verbal for the pharmacy to go ahead and fill today.

## 2024-06-09 NOTE — Progress Notes (Signed)
 Subjective:  Patient ID: [name] [name], male    DOB: 1966-02-23  Age: 58 y.o. MRN: [id number]  CC: No chief complaint on file.   HPI TADD HOLTMEYER presents for depression, wants STD tests C/o urinary frequency   Outpatient Medications Prior to Visit  Medication Sig Dispense Refill   amLODipine -valsartan  (EXFORGE ) 10-320 MG tablet Take 1 tablet by mouth daily. 90 tablet 2   ANORO ELLIPTA  62.5-25 MCG/ACT AEPB INHALE ONE PUFF BY MOUTH DAILY 60 each 5   aspirin EC 81 MG tablet Take 81 mg by mouth daily.     Cholecalciferol (VITAMIN D3) 50 MCG (2000 UT) capsule Take 1 capsule (2,000 Units total) by mouth daily. 100 capsule 3   Cyanocobalamin  (VITAMIN B-12) 1000 MCG SUBL Place 1 tablet (1,000 mcg total) under the tongue daily. 100 tablet 3   empagliflozin  (JARDIANCE ) 25 MG TABS tablet Take 1 tablet (25 mg total) by mouth daily before breakfast. 90 tablet 3   fluticasone  (FLONASE ) 50 MCG/ACT nasal spray SPRAY 2 SPRAYS INTO EACH NOSTRIL EVERY DAY 48 mL 1   furosemide  (LASIX ) 20 MG tablet TAKE 1 OR 2 TABLETS BY MOUTH DAILY AS NEEDED 60 tablet 3   insulin  glargine, 2 Unit Dial, (TOUJEO  MAX SOLOSTAR) 300 UNIT/ML Solostar Pen Inject 40 Units into the skin every morning. And pen needles 1/day. 15 mL 3   lovastatin  (MEVACOR ) 20 MG tablet Take 1 tablet (20 mg total) by mouth daily. 90 tablet 3   metFORMIN  (GLUCOPHAGE -XR) 500 MG 24 hr tablet Take 1 tablet (500 mg total) by mouth daily in the afternoon. 90 tablet 3   metoCLOPramide  (REGLAN ) 5 MG tablet Take 1 tablet (5 mg total) by mouth every 12 (twelve) hours as needed for up to 2 doses for nausea. Take 30-45 minutes before evening and AM doses of bowel preparation solution. 2 tablet 0   metoprolol  tartrate (LOPRESSOR ) 25 MG tablet Take 2 hours before CT scan. 1 tablet 0   metoprolol  tartrate (LOPRESSOR ) 25 MG tablet Take 1 tablet (25 mg total) by mouth 2 (two) times daily. 180 tablet 3   morphine  (MSIR) 15 MG tablet Take 1 tablet (15 mg total)  by mouth 2 (two) times daily as needed for severe pain (pain score 7-10). 60 tablet 0   morphine  (MSIR) 15 MG tablet Take 1 tablet (15 mg total) by mouth 2 (two) times daily as needed for severe pain (pain score 7-10). 60 tablet 0   morphine  (MSIR) 15 MG tablet Take 1 tablet (15 mg total) by mouth 2 (two) times daily as needed for severe pain (pain score 7-10). 60 tablet 0   Morphine  Sulfate ER 200 MG TBCR Take 200 mg by mouth 3 (three) times daily. 90 tablet 0   Morphine  Sulfate ER 200 MG TBCR Take 200 mg by mouth 2 (two) times daily. 60 tablet 0   naloxone  (NARCAN ) nasal spray 4 mg/0.1 mL 1 actuation in one nostril once. May repeat in 2-3 min 1 each 2   ONETOUCH ULTRA test strip USE TO TEST BLOOD SUGAR TWICE DAILY 100 strip 2   pantoprazole  (PROTONIX ) 40 MG tablet Take 1 tablet (40 mg total) by mouth 2 (two) times daily. 60 tablet 11   Semaglutide , 2 MG/DOSE, 8 MG/3ML SOPN Inject 2 mg as directed once a week. 9 mL 3   Morphine  Sulfate ER 200 MG TBCR Take 200 mg by mouth 2 (two) times daily. 60 tablet 0   No facility-administered medications prior to visit.  ROS: Review of Systems  Constitutional:  Negative for appetite change, fatigue and unexpected weight change.  HENT:  Negative for congestion, nosebleeds, sneezing, sore throat and trouble swallowing.   Eyes:  Negative for itching and visual disturbance.  Respiratory:  Negative for cough.   Cardiovascular:  Negative for chest pain, palpitations and leg swelling.  Gastrointestinal:  Negative for abdominal distention, blood in stool, diarrhea and nausea.  Genitourinary:  Negative for frequency and hematuria.  Musculoskeletal:  Negative for back pain, gait problem, joint swelling and neck pain.  Skin:  Negative for rash.  Neurological:  Negative for dizziness, tremors, speech difficulty and weakness.  Psychiatric/Behavioral:  Positive for dysphoric mood and sleep disturbance. Negative for agitation and suicidal ideas. The patient is  nervous/anxious.     Objective:  There were no vitals taken for this visit.  BP Readings from Last 3 Encounters:  04/20/24 136/83  02/25/24 (!) 141/62  02/23/24 (!) 150/80    Wt Readings from Last 3 Encounters:  04/20/24 220 lb (99.8 kg)  02/23/24 216 lb (98 kg)  01/27/24 211 lb 12.8 oz (96.1 kg)    Physical Exam Constitutional:      General: He is not in acute distress.    Appearance: Normal appearance. He is well-developed.     Comments: NAD  Eyes:     Conjunctiva/sclera: Conjunctivae normal.     Pupils: Pupils are equal, round, and reactive to light.  Neck:     Thyroid : No thyromegaly.     Vascular: No JVD.  Cardiovascular:     Rate and Rhythm: Normal rate and regular rhythm.     Heart sounds: Normal heart sounds. No murmur heard.    No friction rub. No gallop.  Pulmonary:     Effort: Pulmonary effort is normal. No respiratory distress.     Breath sounds: Normal breath sounds. No wheezing or rales.  Chest:     Chest wall: No tenderness.  Abdominal:     General: Bowel sounds are normal. There is no distension.     Palpations: Abdomen is soft. There is no mass.     Tenderness: There is no abdominal tenderness. There is no guarding or rebound.  Musculoskeletal:        General: No tenderness. Normal range of motion.     Cervical back: Normal range of motion.  Lymphadenopathy:     Cervical: No cervical adenopathy.  Skin:    General: Skin is warm and dry.     Findings: No rash.  Neurological:     Mental Status: He is alert and oriented to person, place, and time.     Cranial Nerves: No cranial nerve deficit.     Motor: No abnormal muscle tone.     Coordination: Coordination normal.     Gait: Gait normal.     Deep Tendon Reflexes: Reflexes are normal and symmetric.  Psychiatric:        Behavior: Behavior normal.        Thought Content: Thought content normal.        Judgment: Judgment normal.     Lab Results  Component Value Date   WBC 9.7 07/17/2023    HGB 16.7 07/17/2023   HCT 48.1 07/17/2023   PLT 229 07/17/2023   GLUCOSE 91 01/27/2024   CHOL 144 06/29/2023   TRIG 99.0 06/29/2023   HDL 55.80 06/29/2023   LDLDIRECT 116.6 05/20/2007   LDLCALC 68 06/29/2023   ALT 25 01/21/2022   AST 22 01/21/2022  NA 135 01/27/2024   K 4.5 01/27/2024   CL 95 (L) 01/27/2024   CREATININE 0.96 01/27/2024   BUN 11 01/27/2024   CO2 24 01/27/2024   TSH 4.410 01/27/2024   PSA 0.20 04/23/2018   INR 1.1 05/10/2008   HGBA1C 6.3 (A) 12/28/2023    ECHOCARDIOGRAM COMPLETE Result Date: 03/09/2024    ECHOCARDIOGRAM REPORT   Patient Name:   BRENYN PETREY Date of Exam: 03/09/2024 Medical Rec #:  [id number]        Height:       70.0 in Accession #:    [id number]       Weight:       216.0 lb Date of Birth:  Dec 28, 1965       BSA:          2.157 m Patient Age:    57 years         BP:           112/60 mmHg Patient Gender: M                HR:           64 bpm. Exam Location:  [address] Procedure: 2D Echo (Both Spectral and Color Flow Doppler were utilized during            procedure). Indications:    R06.02 SOB; I33.9 SBE  History:        Patient has prior history of Echocardiogram examinations, most                 recent 03/18/2018. CAD, Abnormal ECG, COPD, Arrythmias:PVC,                 Signs/Symptoms:Chest Pain and Edema; Risk Factors:Hypertension,                 Diabetes, Dyslipidemia and Current Smoker. Shortness of breath                 with exertion. Asthmatic bronchitis.  Sonographer:    Jon Hacker RCS Referring Phys: [id number] CHRISTOPHER L SCHUMANN IMPRESSIONS  1. Left ventricular ejection fraction, by estimation, is 55 to 60%. The left ventricle has normal function. The left ventricle has no regional wall motion abnormalities. Left ventricular diastolic parameters were normal.  2. Right ventricular systolic function is normal. The right ventricular size is normal. Tricuspid regurgitation signal is inadequate for assessing PA pressure.  3. The mitral valve is  degenerative. There is mild calcification of the mitral valve leaflet(s) extending into the chordae tendinae.     No evidence of mitral valve regurgitation. No evidence of mitral stenosis.  4. The aortic valve is tricuspid. Aortic valve regurgitation is not visualized. Aortic valve sclerosis/calcification is present, without any evidence of aortic stenosis.  5. Aortic dilatation noted. There is mild dilatation of the ascending aorta, measuring 38 mm.  6. The inferior vena cava is dilated in size with >50% respiratory variability, suggesting right atrial pressure of 8 mmHg. FINDINGS  Left Ventricle: Left ventricular ejection fraction, by estimation, is 55 to 60%. The left ventricle has normal function. The left ventricle has no regional wall motion abnormalities. The left ventricular internal cavity size was normal in size. There is  no left ventricular hypertrophy. Left ventricular diastolic parameters were normal. Normal left ventricular filling pressure. Right Ventricle: The right ventricular size is normal. No increase in right ventricular wall thickness. Right ventricular systolic function is normal. Tricuspid regurgitation signal is inadequate for assessing PA pressure. Left  Atrium: Left atrial size was normal in size. Right Atrium: Right atrial size was normal in size. Pericardium: There is no evidence of pericardial effusion. Mitral Valve: The mitral valve is degenerative in appearance. There is mild calcification of the mitral valve leaflet(s). No evidence of mitral valve regurgitation. No evidence of mitral valve stenosis. Tricuspid Valve: The tricuspid valve is normal in structure. Tricuspid valve regurgitation is not demonstrated. No evidence of tricuspid stenosis. Aortic Valve: The aortic valve is tricuspid. Aortic valve regurgitation is not visualized. Aortic valve sclerosis/calcification is present, without any evidence of aortic stenosis. Pulmonic Valve: The pulmonic valve was normal in structure.  Pulmonic valve regurgitation is not visualized. No evidence of pulmonic stenosis. Aorta: Aortic dilatation noted. There is mild dilatation of the ascending aorta, measuring 38 mm. Venous: The inferior vena cava is dilated in size with greater than 50% respiratory variability, suggesting right atrial pressure of 8 mmHg. IAS/Shunts: No atrial level shunt detected by color flow Doppler.  LEFT VENTRICLE PLAX 2D LVIDd:         4.94 cm   Diastology LVIDs:         3.83 cm   LV e' medial:    11.50 cm/s LV PW:         1.02 cm   LV E/e' medial:  10.2 LV IVS:        0.87 cm   LV e' lateral:   14.90 cm/s LVOT diam:     2.10 cm   LV E/e' lateral: 7.9 LV SV:         92 LV SV Index:   43 LVOT Area:     3.46 cm  RIGHT VENTRICLE RV Basal diam:  3.06 cm RV S prime:     18.70 cm/s TAPSE (M-mode): 2.8 cm LEFT ATRIUM             Index        RIGHT ATRIUM           Index LA diam:        3.60 cm 1.67 cm/m   RA Area:     17.50 cm LA Vol (A2C):   58.0 ml 26.89 ml/m  RA Volume:   44.60 ml  20.68 ml/m LA Vol (A4C):   38.4 ml 17.80 ml/m LA Biplane Vol: 46.4 ml 21.51 ml/m  AORTIC VALVE LVOT Vmax:   123.00 cm/s LVOT Vmean:  74.700 cm/s LVOT VTI:    0.267 m  AORTA Ao Root diam: 3.70 cm Ao Asc diam:  3.80 cm MITRAL VALVE MV Area (PHT): 2.39 cm     SHUNTS MV Decel Time: 318 msec     Systemic VTI:  0.27 m MV E velocity: 117.00 cm/s  Systemic Diam: 2.10 cm MV A velocity: 124.00 cm/s MV E/A ratio:  0.94 Wilbert Bihari MD Electronically signed by Wilbert Bihari MD Signature Date/Time: 03/09/2024/3:10:04 PM    Final     Assessment & Plan:   Problem List Items Addressed This Visit   None     Meds ordered this encounter  Medications   Morphine  Sulfate ER 200 MG TBCR    Sig: Take 200 mg by mouth in the morning, at noon, and at bedtime.    Dispense:  90 tablet    Refill:  0    Fill on or after 06/19/24. Dx: M54.5      Follow-up: No follow-ups on file.  Marolyn Noel, MD

## 2024-06-10 NOTE — Telephone Encounter (Signed)
 Matter has been addressed in separate telephone note

## 2024-06-10 NOTE — Telephone Encounter (Signed)
I took care of it. Thank you. 

## 2024-06-28 ENCOUNTER — Ambulatory Visit: Admitting: Internal Medicine

## 2024-06-28 ENCOUNTER — Encounter: Payer: Self-pay | Admitting: Internal Medicine

## 2024-06-28 ENCOUNTER — Other Ambulatory Visit

## 2024-06-28 VITALS — BP 120/80 | HR 51 | Ht 70.0 in | Wt 226.0 lb

## 2024-06-28 DIAGNOSIS — E785 Hyperlipidemia, unspecified: Secondary | ICD-10-CM | POA: Diagnosis not present

## 2024-06-28 DIAGNOSIS — E1142 Type 2 diabetes mellitus with diabetic polyneuropathy: Secondary | ICD-10-CM | POA: Diagnosis not present

## 2024-06-28 DIAGNOSIS — Z794 Long term (current) use of insulin: Secondary | ICD-10-CM | POA: Diagnosis not present

## 2024-06-28 DIAGNOSIS — E1159 Type 2 diabetes mellitus with other circulatory complications: Secondary | ICD-10-CM | POA: Diagnosis not present

## 2024-06-28 LAB — POCT GLYCOSYLATED HEMOGLOBIN (HGB A1C): Hemoglobin A1C: 6.7 % — AB (ref 4.0–5.6)

## 2024-06-28 LAB — POCT GLUCOSE (DEVICE FOR HOME USE): POC Glucose: 121 mg/dL — AB (ref 70–99)

## 2024-06-28 MED ORDER — SEMAGLUTIDE (1 MG/DOSE) 4 MG/3ML ~~LOC~~ SOPN
1.0000 mg | PEN_INJECTOR | SUBCUTANEOUS | 3 refills | Status: AC
Start: 2024-06-28 — End: ?

## 2024-06-28 MED ORDER — EMPAGLIFLOZIN 25 MG PO TABS: 25.0000 mg | ORAL_TABLET | Freq: Every day | ORAL | 3 refills | Status: AC

## 2024-06-28 MED ORDER — LOVASTATIN 20 MG PO TABS: 20.0000 mg | ORAL_TABLET | Freq: Every day | ORAL | 3 refills | Status: AC

## 2024-06-28 MED ORDER — METFORMIN HCL ER 500 MG PO TB24: 500.0000 mg | ORAL_TABLET | Freq: Every day | ORAL | 3 refills | Status: AC

## 2024-06-28 MED ORDER — TOUJEO MAX SOLOSTAR 300 UNIT/ML ~~LOC~~ SOPN: 40.0000 [IU] | PEN_INJECTOR | SUBCUTANEOUS | 3 refills | Status: AC

## 2024-06-28 NOTE — Patient Instructions (Addendum)
 Decrease  Ozempic  1 mg weekly (in the meantime, you may use Ozempic   2 mg pen,  take 18 clicks once weekly for 2 weeks, then increase to 36 clicks to get you to the 1 mg dose) Continue  Toujeo  to 40 units daily  Continue  Jardiance  25 mg, half  tablet daily  Continue Metformin  500 mg, 1 tablet daily      HOW TO TREAT LOW BLOOD SUGARS (Blood sugar LESS THAN 70 MG/DL) Please follow the RULE OF 15 for the treatment of hypoglycemia treatment (when your (blood sugars are less than 70 mg/dL)   STEP 1: Take 15 grams of carbohydrates when your blood sugar is low, which includes:  3-4 GLUCOSE TABS  OR 3-4 OZ OF JUICE OR REGULAR SODA OR ONE TUBE OF GLUCOSE GEL    STEP 2: RECHECK blood sugar in 15 MINUTES STEP 3: If your blood sugar is still low at the 15 minute recheck --> then, go back to STEP 1 and treat AGAIN with another 15 grams of carbohydrates.

## 2024-06-28 NOTE — Progress Notes (Signed)
 Name: [name] [name]  Age/ Sex: 58 y.o., male   MRN/ DOB: [id number], Jun 02, 1966     PCP: Garald Karlynn GAILS, MD   Reason for Endocrinology Evaluation: Type 2 Diabetes Mellitus  Initial Endocrine Consultative Visit: 09/17/2012    PATIENT IDENTIFIER: [name] [name] is a 58 y.o. male with a past medical history of HTN, CAD, DM, COPD. The patient has followed with Endocrinology clinic since 09/17/2012 for consultative assistance with management of his diabetes.  DIABETIC HISTORY:  [name] [name] was diagnosed with DM 2005, and started insulin  therapy in 2007. His hemoglobin A1c has ranged from 10.6% in 2020, peaking at 7.5% in 2022.  He was followed by Dr. Kassie from 2014 until March 2023   Stopped Acabose 07/2023  Decreased Jardiance  due to urinary incontinence 06/2023  SUBJECTIVE:   During the last visit (12/28/2023):A1c 6.3%     Today (06/28/2024): [name] [name] is here for follow-up on diabetes management.. He has not used the Dexcom in months, he was having difficulty with the sensor.Checks glucose 1x daily, patient does endorse hypoglycemic episodes in the upper 60s.  The patient has gained 15 LB since his last visit here, he has not been using Ozempic  for a few weeks due to constipation Urine incontinence has improved  No nausea or vomiting   HOME DIABETES REGIMEN:  Jardiance  25 mg, half a tablet daily  Metformin  500 mg XR daily  Ozempic  2 mg weekly -not taking Toujeo  40 units daily     Statin: yes ACE-I/ARB: yes    METER DOWNLOAD SUMMARY: n/a    DIABETIC COMPLICATIONS: Microvascular complications:  Neuropathy Denies: CKD Last Eye Exam: Completed 2024  Macrovascular complications:  CAD Denies:  CVA, PVD   HISTORY:  Past Medical History:  Past Medical History:  Diagnosis Date   Arrhythmia    Arthritis    COPD (chronic obstructive pulmonary disease) (HCC)    Diabetes mellitus    GERD (gastroesophageal reflux disease)    Hyperlipidemia     Hypertension    Lower back pain    Nephrolithiasis    Dr Watt   Obesity    Past Surgical History:  Past Surgical History:  Procedure Laterality Date   colonscopy     KNEE SURGERY Left    scope   Social History:  reports that he has been smoking cigarettes. He has never used smokeless tobacco. He reports that he does not drink alcohol and does not use drugs. Family History:  Family History  Problem Relation Age of Onset   Hypertension Mother    Diabetes Mother    CAD Mother    Vision loss Sister    Stomach cancer Maternal Grandfather    Heart disease Other        CAD male relative <60   Esophageal cancer Neg Hx    Colon cancer Neg Hx      HOME MEDICATIONS: Allergies as of 06/28/2024       Reactions   Belviq [lorcaserin  Hcl]    wierd   Benazepril Hcl    REACTION: cough   Nsaids    Can take short term Upset stomach        Medication List        Accurate as of June 28, 2024  2:04 PM. If you have any questions, ask your nurse or doctor.          amLODipine -valsartan  10-320 MG tablet Commonly known as: EXFORGE  Take 1 tablet by mouth daily.   Anoro Ellipta   62.5-25 MCG/ACT Aepb Generic drug: umeclidinium-vilanterol INHALE ONE PUFF BY MOUTH DAILY   aspirin EC 81 MG tablet Take 81 mg by mouth daily.   empagliflozin  25 MG Tabs tablet Commonly known as: Jardiance  Take 1 tablet (25 mg total) by mouth daily before breakfast. What changed: how much to take   fluticasone  50 MCG/ACT nasal spray Commonly known as: FLONASE  SPRAY 2 SPRAYS INTO EACH NOSTRIL EVERY DAY   furosemide  20 MG tablet Commonly known as: LASIX  TAKE 1 OR 2 TABLETS BY MOUTH DAILY AS NEEDED   lovastatin  20 MG tablet Commonly known as: MEVACOR  Take 1 tablet (20 mg total) by mouth daily.   metFORMIN  500 MG 24 hr tablet Commonly known as: GLUCOPHAGE -XR Take 1 tablet (500 mg total) by mouth daily in the afternoon.   metoCLOPramide  5 MG tablet Commonly known as: Reglan  Take 1  tablet (5 mg total) by mouth every 12 (twelve) hours as needed for up to 2 doses for nausea. Take 30-45 minutes before evening and AM doses of bowel preparation solution.   metoprolol  tartrate 25 MG tablet Commonly known as: LOPRESSOR  Take 2 hours before CT scan.   metoprolol  tartrate 25 MG tablet Commonly known as: LOPRESSOR  Take 1 tablet (25 mg total) by mouth 2 (two) times daily.   morphine  15 MG tablet Commonly known as: MSIR Take 1 tablet (15 mg total) by mouth 2 (two) times daily as needed for severe pain (pain score 7-10).   Morphine  Sulfate ER 200 MG Tbcr Take 200 mg by mouth 3 (three) times daily.   Morphine  Sulfate ER 200 MG Tbcr Take 200 mg by mouth 2 (two) times daily.   morphine  15 MG tablet Commonly known as: MSIR Take 1 tablet (15 mg total) by mouth 2 (two) times daily as needed for severe pain (pain score 7-10).   morphine  15 MG tablet Commonly known as: MSIR Take 1 tablet (15 mg total) by mouth 2 (two) times daily as needed for severe pain (pain score 7-10).   Morphine  Sulfate ER 200 MG Tbcr Take 200 mg by mouth in the morning, at noon, and at bedtime.   naloxone  4 MG/0.1ML Liqd nasal spray kit Commonly known as: NARCAN  1 actuation in one nostril once. May repeat in 2-3 min   OneTouch Ultra test strip Generic drug: glucose blood USE TO TEST BLOOD SUGAR TWICE DAILY   pantoprazole  40 MG tablet Commonly known as: PROTONIX  Take 1 tablet (40 mg total) by mouth 2 (two) times daily.   Semaglutide  (2 MG/DOSE) 8 MG/3ML Sopn Inject 2 mg as directed once a week.   Toujeo  Max SoloStar 300 UNIT/ML Solostar Pen Generic drug: insulin  glargine (2 Unit Dial) Inject 40 Units into the skin every morning. And pen needles 1/day.   Vitamin B-12 1000 MCG Subl Place 1 tablet (1,000 mcg total) under the tongue daily.   Vitamin D3 50 MCG (2000 UT) capsule Take 1 capsule (2,000 Units total) by mouth daily.         OBJECTIVE:   Vital Signs: BP 120/80 (BP Location:  Left Arm, Patient Position: Sitting, Cuff Size: Normal)   Pulse (!) 51   Ht 5' 10 (1.778 m)   Wt 226 lb (102.5 kg)   SpO2 99%   BMI 32.43 kg/m   Wt Readings from Last 3 Encounters:  06/28/24 226 lb (102.5 kg)  04/20/24 220 lb (99.8 kg)  02/23/24 216 lb (98 kg)     Exam: General: Pt appears well and is in NAD  Lungs: Clear with good BS bilat   Heart: RRR   Extremities: Trace  pretibial edema.   Neuro: MS is good with appropriate affect, pt is alert and Ox3    DM foot exam: 06/28/2024  The skin of the feet is intact without sores or ulcerations. The pedal pulses are 1+ on right and 1+ on left. The sensation is decreased to a screening 5.07, 10 gram monofilament on the left, intact on the right     DATA REVIEWED:  Lab Results  Component Value Date   HGBA1C 6.3 (A) 12/28/2023   HGBA1C 7.5 (A) 06/29/2023   HGBA1C 7.9 (A) 12/24/2022    Latest Reference Range & Units 01/27/24 15:03  Sodium 134 - 144 mmol/L 135  Potassium 3.5 - 5.2 mmol/L 4.5  Chloride 96 - 106 mmol/L 95 (L)  CO2 20 - 29 mmol/L 24  Glucose 70 - 99 mg/dL 91  BUN 6 - 24 mg/dL 11  Creatinine 9.23 - 8.72 mg/dL 9.03  Calcium 8.7 - 89.7 mg/dL 9.5  BUN/Creatinine Ratio 9 - 20  11  eGFR >59 mL/min/1.73 91  Magnesium 1.6 - 2.3 mg/dL 2.1    Latest Reference Range & Units 01/27/24 15:04  TSH 0.450 - 4.500 uIU/mL 4.410     In office BG 121 mg/DL  ASSESSMENT / PLAN / RECOMMENDATIONS:   1) Type 2 Diabetes Mellitus, OPtimally controlled, With Neuropathic and macrovascular  complications - Most recent A1c of 6.7 %. Goal A1c < 7.0 %.    -A1c remains at goal -He does not recall being on higher doses of metformin , no change at this time -He discontinued acarbose  due to hypoglycemia - Intolerant to higher doses of Jardiance  due to urinary incontinence, but he is willing to try going back up to 1 tablet and seeing if he is able to tolerated -Patient has discontinued 2 mg of Ozempic  due to severe constipation, I  will restart him on a smaller dose of Ozempic  1 mg, encourage hydration and fiber intake   MEDICATIONS: Continue Jardiance  25 mg, half a tablet daily Continue metformin  500 mg daily Continue Toujeo  40 units daily Decrease Ozempic  1 mg weekly   EDUCATION / INSTRUCTIONS: BG monitoring instructions: Patient is instructed to check his blood sugars 1 times a day, fasting . Call [facility] if: BG persistently < 70  I reviewed the Rule of 15 for the treatment of hypoglycemia in detail with the patient. Literature supplied.    2) Diabetic complications:  Eye: Does not have known diabetic retinopathy.  Neuro/ Feet: Does  have known diabetic peripheral neuropathy .  Renal: Patient does not have known baseline CKD. He   is  on an ACEI/ARB at present.   3) Dyslipidemia:  - Lipid panel has been at goal -Patient on lovastatin  20 mg daily     F/U in 6 months   Signed electronically by: Stefano Redgie Butts, MD  [facility]  [facility] [address] [place] [place], [place] [address] Phone: [phone] FAX: [phone]   CC: Garald Karlynn GAILS, MD [address] [place] [address] Phone: [phone]  Fax: [phone]  Return to Endocrinology clinic as below: Future Appointments  Date Time Provider Department Center  07/21/2024  3:20 PM Plotnikov, Karlynn GAILS, MD [id number]

## 2024-06-29 LAB — MICROALBUMIN / CREATININE URINE RATIO
Creatinine, Urine: 89 mg/dL (ref 20–320)
Microalb Creat Ratio: 131 mg/g{creat} — ABNORMAL HIGH (ref <30)
Microalb, Ur: 11.7 mg/dL

## 2024-07-05 ENCOUNTER — Ambulatory Visit: Payer: Self-pay | Admitting: Internal Medicine

## 2024-07-21 ENCOUNTER — Encounter: Payer: Self-pay | Admitting: Internal Medicine

## 2024-07-21 ENCOUNTER — Ambulatory Visit: Admitting: Internal Medicine

## 2024-07-21 VITALS — BP 144/70 | HR 52 | Temp 98.3°F | Ht 70.0 in | Wt 219.8 lb

## 2024-07-21 DIAGNOSIS — I2510 Atherosclerotic heart disease of native coronary artery without angina pectoris: Secondary | ICD-10-CM

## 2024-07-21 DIAGNOSIS — M545 Low back pain, unspecified: Secondary | ICD-10-CM

## 2024-07-21 DIAGNOSIS — I2583 Coronary atherosclerosis due to lipid rich plaque: Secondary | ICD-10-CM

## 2024-07-21 DIAGNOSIS — M79605 Pain in left leg: Secondary | ICD-10-CM

## 2024-07-21 DIAGNOSIS — M79604 Pain in right leg: Secondary | ICD-10-CM

## 2024-07-21 DIAGNOSIS — E538 Deficiency of other specified B group vitamins: Secondary | ICD-10-CM | POA: Diagnosis not present

## 2024-07-21 DIAGNOSIS — I10 Essential (primary) hypertension: Secondary | ICD-10-CM | POA: Diagnosis not present

## 2024-07-21 DIAGNOSIS — Z23 Encounter for immunization: Secondary | ICD-10-CM | POA: Diagnosis not present

## 2024-07-21 MED ORDER — MORPHINE SULFATE ER 200 MG PO TBCR: 200.0000 mg | EXTENDED_RELEASE_TABLET | Freq: Two times a day (BID) | ORAL | 0 refills | Status: DC

## 2024-07-21 MED ORDER — MORPHINE SULFATE 15 MG PO TABS: 15.0000 mg | ORAL_TABLET | Freq: Two times a day (BID) | ORAL | 0 refills | Status: AC | PRN

## 2024-07-21 MED ORDER — MORPHINE SULFATE 15 MG PO TABS: 15.0000 mg | ORAL_TABLET | Freq: Two times a day (BID) | ORAL | 0 refills | Status: DC | PRN

## 2024-07-21 MED ORDER — MORPHINE SULFATE ER 200 MG PO TBCR: 200.0000 mg | EXTENDED_RELEASE_TABLET | Freq: Three times a day (TID) | ORAL | 0 refills | Status: AC

## 2024-07-21 MED ORDER — MORPHINE SULFATE ER 200 MG PO TBCR: 200.0000 mg | EXTENDED_RELEASE_TABLET | Freq: Three times a day (TID) | ORAL | 0 refills | Status: DC

## 2024-07-21 NOTE — Assessment & Plan Note (Signed)
 Using Lovastatin  F/u w/Dr Pietro

## 2024-07-21 NOTE — Progress Notes (Addendum)
 Subjective:  Patient ID: [name] [name], male    DOB: 01-05-1966  Age: 58 y.o. MRN: [id number]  CC: Medical Management of Chronic Issues (3 Month follow up)   HPI [name] [name] presents for chronic pain, DM, HTN, B12 def  Outpatient Medications Prior to Visit  Medication Sig Dispense Refill   amLODipine -valsartan  (EXFORGE ) 10-320 MG tablet Take 1 tablet by mouth daily. 90 tablet 2   ANORO ELLIPTA  62.5-25 MCG/ACT AEPB INHALE ONE PUFF BY MOUTH DAILY 60 each 5   aspirin EC 81 MG tablet Take 81 mg by mouth daily.     Cholecalciferol (VITAMIN D3) 50 MCG (2000 UT) capsule Take 1 capsule (2,000 Units total) by mouth daily. 100 capsule 3   Cyanocobalamin  (VITAMIN B-12) 1000 MCG SUBL Place 1 tablet (1,000 mcg total) under the tongue daily. 100 tablet 3   empagliflozin  (JARDIANCE ) 25 MG TABS tablet Take 1 tablet (25 mg total) by mouth daily before breakfast. 90 tablet 3   fluticasone  (FLONASE ) 50 MCG/ACT nasal spray SPRAY 2 SPRAYS INTO EACH NOSTRIL EVERY DAY 48 mL 1   furosemide  (LASIX ) 20 MG tablet TAKE 1 OR 2 TABLETS BY MOUTH DAILY AS NEEDED 60 tablet 3   insulin  glargine, 2 Unit Dial, (TOUJEO  MAX SOLOSTAR) 300 UNIT/ML Solostar Pen Inject 40 Units into the skin every morning. And pen needles 1/day. 15 mL 3   lovastatin  (MEVACOR ) 20 MG tablet Take 1 tablet (20 mg total) by mouth daily. 90 tablet 3   metFORMIN  (GLUCOPHAGE -XR) 500 MG 24 hr tablet Take 1 tablet (500 mg total) by mouth daily in the afternoon. 90 tablet 3   metoCLOPramide  (REGLAN ) 5 MG tablet Take 1 tablet (5 mg total) by mouth every 12 (twelve) hours as needed for up to 2 doses for nausea. Take 30-45 minutes before evening and AM doses of bowel preparation solution. 2 tablet 0   metoprolol  tartrate (LOPRESSOR ) 25 MG tablet Take 2 hours before CT scan. 1 tablet 0   metoprolol  tartrate (LOPRESSOR ) 25 MG tablet Take 1 tablet (25 mg total) by mouth 2 (two) times daily. 180 tablet 3   naloxone  (NARCAN ) nasal spray 4 mg/0.1 mL 1  actuation in one nostril once. May repeat in 2-3 min 1 each 2   ONETOUCH ULTRA test strip USE TO TEST BLOOD SUGAR TWICE DAILY 100 strip 2   pantoprazole  (PROTONIX ) 40 MG tablet Take 1 tablet (40 mg total) by mouth 2 (two) times daily. 60 tablet 11   Semaglutide , 1 MG/DOSE, 4 MG/3ML SOPN Inject 1 mg as directed once a week. 9 mL 3   morphine  (MSIR) 15 MG tablet Take 1 tablet (15 mg total) by mouth 2 (two) times daily as needed for severe pain (pain score 7-10). 60 tablet 0   morphine  (MSIR) 15 MG tablet Take 1 tablet (15 mg total) by mouth 2 (two) times daily as needed for severe pain (pain score 7-10). 60 tablet 0   morphine  (MSIR) 15 MG tablet Take 1 tablet (15 mg total) by mouth 2 (two) times daily as needed for severe pain (pain score 7-10). 60 tablet 0   Morphine  Sulfate ER 200 MG TBCR Take 200 mg by mouth 3 (three) times daily. 90 tablet 0   Morphine  Sulfate ER 200 MG TBCR Take 200 mg by mouth 2 (two) times daily. 60 tablet 0   Morphine  Sulfate ER 200 MG TBCR Take 200 mg by mouth in the morning, at noon, and at bedtime. 90 tablet 0   No  facility-administered medications prior to visit.    ROS: Review of Systems  Constitutional:  Negative for appetite change, fatigue and unexpected weight change.  HENT:  Negative for congestion, nosebleeds, sneezing, sore throat and trouble swallowing.   Eyes:  Negative for itching and visual disturbance.  Respiratory:  Negative for cough.   Cardiovascular:  Negative for chest pain, palpitations and leg swelling.  Gastrointestinal:  Negative for abdominal distention, blood in stool, diarrhea and nausea.  Genitourinary:  Negative for frequency and hematuria.  Musculoskeletal:  Positive for arthralgias, back pain and gait problem. Negative for joint swelling and neck pain.  Skin:  Negative for rash.  Neurological:  Negative for dizziness, tremors, speech difficulty and weakness.  Psychiatric/Behavioral:  Negative for agitation, dysphoric mood and sleep  disturbance. The patient is not nervous/anxious.     Objective:  BP (!) 144/70   Pulse (!) 52   Temp 98.3 F (36.8 C)   Ht 5' 10 (1.778 m)   Wt 219 lb 12.8 oz (99.7 kg)   SpO2 99%   BMI 31.54 kg/m   BP Readings from Last 3 Encounters:  07/21/24 (!) 144/70  06/28/24 120/80  04/20/24 136/83    Wt Readings from Last 3 Encounters:  07/21/24 219 lb 12.8 oz (99.7 kg)  06/28/24 226 lb (102.5 kg)  04/20/24 220 lb (99.8 kg)    Physical Exam Constitutional:      General: He is not in acute distress.    Appearance: He is well-developed. He is obese.     Comments: NAD  Eyes:     Conjunctiva/sclera: Conjunctivae normal.     Pupils: Pupils are equal, round, and reactive to light.  Neck:     Thyroid : No thyromegaly.     Vascular: No JVD.  Cardiovascular:     Rate and Rhythm: Normal rate and regular rhythm.     Heart sounds: Normal heart sounds. No murmur heard.    No friction rub. No gallop.  Pulmonary:     Effort: Pulmonary effort is normal. No respiratory distress.     Breath sounds: Normal breath sounds. No wheezing or rales.  Chest:     Chest wall: No tenderness.  Abdominal:     General: Bowel sounds are normal. There is no distension.     Palpations: Abdomen is soft. There is no mass.     Tenderness: There is no abdominal tenderness. There is no guarding or rebound.  Musculoskeletal:        General: No tenderness. Normal range of motion.     Cervical back: Normal range of motion.  Lymphadenopathy:     Cervical: No cervical adenopathy.  Skin:    General: Skin is warm and dry.     Findings: No rash.  Neurological:     Mental Status: He is alert and oriented to person, place, and time.     Cranial Nerves: No cranial nerve deficit.     Motor: No abnormal muscle tone.     Coordination: Coordination normal.     Gait: Gait normal.     Deep Tendon Reflexes: Reflexes are normal and symmetric.  Psychiatric:        Behavior: Behavior normal.        Thought Content:  Thought content normal.        Judgment: Judgment normal.    LS is stiff w/pain   Lab Results  Component Value Date   WBC 9.7 07/17/2023   HGB 16.7 07/17/2023   HCT 48.1 07/17/2023  PLT 229 07/17/2023   GLUCOSE 91 01/27/2024   CHOL 144 06/29/2023   TRIG 99.0 06/29/2023   HDL 55.80 06/29/2023   LDLDIRECT 116.6 05/20/2007   LDLCALC 68 06/29/2023   ALT 25 01/21/2022   AST 22 01/21/2022   NA 135 01/27/2024   K 4.5 01/27/2024   CL 95 (L) 01/27/2024   CREATININE 0.96 01/27/2024   BUN 11 01/27/2024   CO2 24 01/27/2024   TSH 4.410 01/27/2024   PSA 0.20 04/23/2018   INR 1.1 05/10/2008   HGBA1C 6.7 (A) 06/28/2024   MICROALBUR 11.7 06/28/2024    ECHOCARDIOGRAM COMPLETE Result Date: 03/09/2024    ECHOCARDIOGRAM REPORT   Patient Name:   KEIGEN CADDELL Date of Exam: 03/09/2024 Medical Rec #:  [id number]        Height:       70.0 in Accession #:    [id number]       Weight:       216.0 lb Date of Birth:  November 01, 1965       BSA:          2.157 m Patient Age:    57 years         BP:           112/60 mmHg Patient Gender: M                HR:           64 bpm. Exam Location:  [address] Procedure: 2D Echo (Both Spectral and Color Flow Doppler were utilized during            procedure). Indications:    R06.02 SOB; I33.9 SBE  History:        Patient has prior history of Echocardiogram examinations, most                 recent 03/18/2018. CAD, Abnormal ECG, COPD, Arrythmias:PVC,                 Signs/Symptoms:Chest Pain and Edema; Risk Factors:Hypertension,                 Diabetes, Dyslipidemia and Current Smoker. Shortness of breath                 with exertion. Asthmatic bronchitis.  Sonographer:    Jon Hacker RCS Referring Phys: [id number] CHRISTOPHER L SCHUMANN IMPRESSIONS  1. Left ventricular ejection fraction, by estimation, is 55 to 60%. The left ventricle has normal function. The left ventricle has no regional wall motion abnormalities. Left ventricular diastolic parameters were normal.  2.  Right ventricular systolic function is normal. The right ventricular size is normal. Tricuspid regurgitation signal is inadequate for assessing PA pressure.  3. The mitral valve is degenerative. There is mild calcification of the mitral valve leaflet(s) extending into the chordae tendinae.     No evidence of mitral valve regurgitation. No evidence of mitral stenosis.  4. The aortic valve is tricuspid. Aortic valve regurgitation is not visualized. Aortic valve sclerosis/calcification is present, without any evidence of aortic stenosis.  5. Aortic dilatation noted. There is mild dilatation of the ascending aorta, measuring 38 mm.  6. The inferior vena cava is dilated in size with >50% respiratory variability, suggesting right atrial pressure of 8 mmHg. FINDINGS  Left Ventricle: Left ventricular ejection fraction, by estimation, is 55 to 60%. The left ventricle has normal function. The left ventricle has no regional wall motion abnormalities. The left ventricular internal cavity size was normal in  size. There is  no left ventricular hypertrophy. Left ventricular diastolic parameters were normal. Normal left ventricular filling pressure. Right Ventricle: The right ventricular size is normal. No increase in right ventricular wall thickness. Right ventricular systolic function is normal. Tricuspid regurgitation signal is inadequate for assessing PA pressure. Left Atrium: Left atrial size was normal in size. Right Atrium: Right atrial size was normal in size. Pericardium: There is no evidence of pericardial effusion. Mitral Valve: The mitral valve is degenerative in appearance. There is mild calcification of the mitral valve leaflet(s). No evidence of mitral valve regurgitation. No evidence of mitral valve stenosis. Tricuspid Valve: The tricuspid valve is normal in structure. Tricuspid valve regurgitation is not demonstrated. No evidence of tricuspid stenosis. Aortic Valve: The aortic valve is tricuspid. Aortic valve  regurgitation is not visualized. Aortic valve sclerosis/calcification is present, without any evidence of aortic stenosis. Pulmonic Valve: The pulmonic valve was normal in structure. Pulmonic valve regurgitation is not visualized. No evidence of pulmonic stenosis. Aorta: Aortic dilatation noted. There is mild dilatation of the ascending aorta, measuring 38 mm. Venous: The inferior vena cava is dilated in size with greater than 50% respiratory variability, suggesting right atrial pressure of 8 mmHg. IAS/Shunts: No atrial level shunt detected by color flow Doppler.  LEFT VENTRICLE PLAX 2D LVIDd:         4.94 cm   Diastology LVIDs:         3.83 cm   LV e' medial:    11.50 cm/s LV PW:         1.02 cm   LV E/e' medial:  10.2 LV IVS:        0.87 cm   LV e' lateral:   14.90 cm/s LVOT diam:     2.10 cm   LV E/e' lateral: 7.9 LV SV:         92 LV SV Index:   43 LVOT Area:     3.46 cm  RIGHT VENTRICLE RV Basal diam:  3.06 cm RV S prime:     18.70 cm/s TAPSE (M-mode): 2.8 cm LEFT ATRIUM             Index        RIGHT ATRIUM           Index LA diam:        3.60 cm 1.67 cm/m   RA Area:     17.50 cm LA Vol (A2C):   58.0 ml 26.89 ml/m  RA Volume:   44.60 ml  20.68 ml/m LA Vol (A4C):   38.4 ml 17.80 ml/m LA Biplane Vol: 46.4 ml 21.51 ml/m  AORTIC VALVE LVOT Vmax:   123.00 cm/s LVOT Vmean:  74.700 cm/s LVOT VTI:    0.267 m  AORTA Ao Root diam: 3.70 cm Ao Asc diam:  3.80 cm MITRAL VALVE MV Area (PHT): 2.39 cm     SHUNTS MV Decel Time: 318 msec     Systemic VTI:  0.27 m MV E velocity: 117.00 cm/s  Systemic Diam: 2.10 cm MV A velocity: 124.00 cm/s MV E/A ratio:  0.94 Wilbert Bihari MD Electronically signed by Wilbert Bihari MD Signature Date/Time: 03/09/2024/3:10:04 PM    Final     Assessment & Plan:   Problem List Items Addressed This Visit     B12 deficiency - Primary   On B12 SL.      CAD (coronary artery disease)   Using Lovastatin  F/u w/Dr Pietro      Essential hypertension  Cont w/Amlodipine -Valsartan  NAS  diet      Low back pain radiating to both legs   Chronic severe LBP,  on opioids Continue with MSIR, MSContin   Potential benefits of a long term opioids  use as well as potential risks  and complications were explained to the patient and were aknowledged.       Relevant Medications   morphine  (MSIR) 15 MG tablet   morphine  (MSIR) 15 MG tablet   morphine  (MSIR) 15 MG tablet   Morphine  Sulfate ER 200 MG TBCR   Morphine  Sulfate ER 200 MG TBCR   Morphine  Sulfate ER 200 MG TBCR      Meds ordered this encounter  Medications   morphine  (MSIR) 15 MG tablet    Sig: Take 1 tablet (15 mg total) by mouth 2 (two) times daily as needed for severe pain (pain score 7-10).    Dispense:  60 tablet    Refill:  0    Fill on or after 07/22/24. Dx: M54.5   morphine  (MSIR) 15 MG tablet    Sig: Take 1 tablet (15 mg total) by mouth 2 (two) times daily as needed for severe pain (pain score 7-10).    Dispense:  60 tablet    Refill:  0    Fill on or after 08/21/24. Dx: M54.5   morphine  (MSIR) 15 MG tablet    Sig: Take 1 tablet (15 mg total) by mouth 2 (two) times daily as needed for severe pain (pain score 7-10).    Dispense:  60 tablet    Refill:  0    Fill on or after 09/20/24. Dx: M54.5   Morphine  Sulfate ER 200 MG TBCR    Sig: Take 200 mg by mouth 3 (three) times daily.    Dispense:  90 tablet    Refill:  0    Fill on or after 08/21/24. Dx: M54.5   DISCONTD: Morphine  Sulfate ER 200 MG TBCR    Sig: Take 200 mg by mouth 2 (two) times daily.    Dispense:  60 tablet    Refill:  0    Fill on or after 09/20/24. Dx: M54.5   DISCONTD: Morphine  Sulfate ER 200 MG TBCR    Sig: Take 200 mg by mouth in the morning, at noon, and at bedtime.    Dispense:  90 tablet    Refill:  0    Fill on or after 07/22/24. Dx: M54.5   Morphine  Sulfate ER 200 MG TBCR    Sig: Take 200 mg by mouth in the morning, at noon, and at bedtime.    Dispense:  90 tablet    Refill:  0    Fill on or after 09/20/24. Dx: M54.5    Morphine  Sulfate ER 200 MG TBCR    Sig: Take 200 mg by mouth in the morning, at noon, and at bedtime.    Dispense:  90 tablet    Refill:  0    Fill on or after 07/22/24. Dx: M54.5      Follow-up: Return in about 3 months (around 10/21/2024) for a follow-up visit.  Marolyn Noel, MD

## 2024-07-21 NOTE — Assessment & Plan Note (Signed)
Cont w/Amlodipine-Valsartan NAS diet 

## 2024-07-21 NOTE — Assessment & Plan Note (Signed)
On B12 SL

## 2024-07-21 NOTE — Assessment & Plan Note (Signed)
 Chronic severe LBP, on opioids Continue with MSIR, MSContin   Potential benefits of a long term opioids  use as well as potential risks  and complications were explained to the patient and were aknowledged.

## 2024-07-21 NOTE — Addendum Note (Signed)
 Addended by: [place] V on: 07/21/2024 04:02 PM   Modules accepted: Orders

## 2024-07-21 NOTE — Addendum Note (Signed)
 Addended byBETHA LUCETTA CLEATRICE LELON on: 07/21/2024 04:07 PM   Modules accepted: Orders

## 2024-08-22 ENCOUNTER — Telehealth: Payer: Self-pay

## 2024-08-22 ENCOUNTER — Other Ambulatory Visit (HOSPITAL_COMMUNITY): Payer: Self-pay

## 2024-08-22 NOTE — Telephone Encounter (Signed)
 Pharmacy Patient Advocate Encounter   Received notification from Onbase that prior authorization for Morphine  Sulfate 15MG  tablets is required/requested.   Insurance verification completed.   The patient is insured through [facility].   Per test claim: PA required; PA submitted to above mentioned insurance via Latent Key/confirmation #/[id number] [facility] Status is pending

## 2024-08-22 NOTE — Telephone Encounter (Signed)
 Prior Authorization form/request asks a question that requires your assistance. Please see the question below and advise accordingly. The PA will not be submitted until the necessary information is received.  PLEASE BE ADVISE  we will need the provider to call  [phone] to explain  PLAN AND why they are writing for so much Morphine  BEFORE PA WILL BE ABLE TO PROCESS

## 2024-08-24 NOTE — Telephone Encounter (Unsigned)
 Copied from CRM #[id number]. Topic: Clinical - Prescription Issue >> Aug 23, 2024  4:43 PM Viola F wrote: Reason for CRM: Patient called to follow up on the prior authorization for the Morphine  Sulfate 15MG  tablets. Call back number  [phone]

## 2024-08-24 NOTE — Telephone Encounter (Signed)
 Pt called in to update his insurance to [employer]. He realized his new insurance started on 12/01 and was not sure if we had it updated. He also wanted to see if there was any update on his PA. I informed of the message I seen and told him that someone would reach out once it has been completed or if there were any further issues.

## 2024-08-24 NOTE — Telephone Encounter (Signed)
 Okay. Thank you.

## 2024-08-26 NOTE — Telephone Encounter (Signed)
 Called and spoke with [name] [name] and informed her of the current situation. She expressed understanding

## 2024-08-26 NOTE — Telephone Encounter (Unsigned)
 Copied from CRM #[id number]. Topic: Clinical - Medication Prior Auth >> Aug 26, 2024  3:14 PM Deaijah H wrote: Reason for CRM: Patients wife called in for an update on PA for Morphine  Sulfate 15MG  tablets advised 2 questions are needed to be answered before a decision but would like for someone to reach out with more information. Please call

## 2024-08-30 NOTE — Telephone Encounter (Signed)
" °  The plan is to continue on the current dose of pain meds -pain control has been good for many years. The patient has been stable on this dose of morphine  for many years.  It was started by the pain clinic originally Thanks "

## 2024-09-02 ENCOUNTER — Other Ambulatory Visit (HOSPITAL_COMMUNITY): Payer: Self-pay

## 2024-09-02 NOTE — Telephone Encounter (Unsigned)
 Copied from CRM #[id number]. Topic: Clinical - Medication Prior Auth >> Aug 26, 2024  3:14 PM Deaijah H wrote: Reason for CRM: Patients wife called in for an update on PA for Morphine  Sulfate 15MG  tablets advised 2 questions are needed to be answered before a decision but would like for someone to reach out with more information. Please call >> Aug 31, 2024  3:50 PM Drema MATSU wrote: Patient wife is requesting a callback regarding PA. Pharmacy is still waiting for PA. Please call pt

## 2024-09-02 NOTE — Telephone Encounter (Signed)
 The plan is to continue on the current dose of pain meds -pain control has been good for many years. The patient has been stable on this dose of morphine  for many years.  It was started by the pain clinic originally Thanks   The above is per pts PCP.

## 2024-09-05 NOTE — Telephone Encounter (Signed)
 Noted. Thanks.

## 2024-09-06 ENCOUNTER — Telehealth: Payer: Self-pay

## 2024-09-06 NOTE — Telephone Encounter (Signed)
 Copied from CRM #[id number]. Topic: Clinical - Medication Prior Auth >> Aug 26, 2024  3:14 PM Deaijah H wrote: Reason for CRM: Patients wife called in for an update on PA for Morphine  Sulfate 15MG  tablets advised 2 questions are needed to be answered before a decision but would like for someone to reach out with more information. Please call >> Sep 06, 2024  9:33 AM Amy B wrote: 4th ATTEMPT:  Patient's wife called again asking for update on prior authorization.  Please call her at [phone].  She still has not heard from anyone.   >> Aug 31, 2024  3:50 PM Drema MATSU wrote: Patient wife is requesting a callback regarding PA. Pharmacy is still waiting for PA. Please call pt

## 2024-09-06 NOTE — Telephone Encounter (Unsigned)
 Copied from CRM [id number]. Topic: General - Other >> Sep 06, 2024  3:51 PM Macario HERO wrote: Reason for CRM: Patient spouse called, said she received a call from the office and would like clarity on what is going on with the prior authorization.

## 2024-09-06 NOTE — Telephone Encounter (Signed)
 Tried to reach pts wife and inform her of the following The prior authorization states the medication is on the plan's list and a prior authorization is not required at this time. The test claim has a max of 7 day supply in 7 days. Most plans are now requiring the patient to fill for the 1st 7 days and then they will cover the script.  Pcp has been given the above information as well. Pcp will send in the first 7 day supply and then he will send in the remaining to follow

## 2024-09-07 NOTE — Telephone Encounter (Signed)
 Two questions were answered a while ago.  There was no interruption in chronic pain management with opioids.  7-day prescriptions are needed for new opioid start only. Thanks

## 2024-09-08 NOTE — Telephone Encounter (Signed)
 Copied from CRM [id number]. Topic: General - Other >> Sep 08, 2024  3:48 PM Carlyon D wrote: Reason for CRM: Pt wife Barnie calling to speak to miss Hadassah in regards to a phone call that was missed from her. Called CAL she is gone for the day. Please call pt back at earliest convenience

## 2024-09-08 NOTE — Telephone Encounter (Signed)
 Tried to call pts wife to inform her of the following per PCP response as follows Two questions were answered a while ago.  There was no interruption in chronic pain management with opioids.  7-day prescriptions are needed for new opioid start only. Thanks

## 2024-09-09 NOTE — Telephone Encounter (Unsigned)
 Copied from CRM #[id number]. Topic: Clinical - Medication Question >> Sep 09, 2024  1:57 PM Ivette P wrote: Reason for CRM: Pt mom Barnie called in due to being on phone with Nurse Hadassah, said she wanted to speak about the medication and the call dropped.   Attempted to reach [facility] not available. Please reach back to to mom before end of day.

## 2024-09-09 NOTE — Telephone Encounter (Signed)
 Pts is needing the 7 day supply sent in ASAP as he has nt gotten any of his medications.

## 2024-09-12 ENCOUNTER — Telehealth: Payer: Self-pay | Admitting: Internal Medicine

## 2024-09-12 MED ORDER — MORPHINE SULFATE 15 MG PO TABS: 15.0000 mg | ORAL_TABLET | Freq: Two times a day (BID) | ORAL | 0 refills | Status: AC | PRN

## 2024-09-12 MED ORDER — MORPHINE SULFATE ER 200 MG PO TBCR: 200.0000 mg | EXTENDED_RELEASE_TABLET | Freq: Three times a day (TID) | ORAL | 0 refills | Status: AC

## 2024-09-12 NOTE — Telephone Encounter (Signed)
OK. Done Thx 

## 2024-09-12 NOTE — Addendum Note (Signed)
 Addended by: Naasia Weilbacher V on: 09/12/2024 08:18 AM   Modules accepted: Orders

## 2024-09-12 NOTE — Telephone Encounter (Signed)
 Copied from CRM #[id number]. Topic: Clinical - Prescription Issue >> Sep 12, 2024  3:01 PM Roselie BROCKS wrote: Reason for CRM: Patients wife called to inform there was a mistake on the refill for Morphine  Sulfate ER 200 MG TBCR [[id number]]. For the 200 MG , the patient takes 2 tablets 3 times a day for a total of 6 pills a day. The last refill 09-12-24 was wrote for 1 tablet 3 times a day. And the patient states that is incorrect. And needs the remaining tablets sent to  [facility] [place], [place] - [address]

## 2024-09-15 ENCOUNTER — Telehealth: Payer: Self-pay

## 2024-09-15 NOTE — Telephone Encounter (Signed)
 Copied from CRM #[id number]. Topic: Clinical - Prescription Issue >> Sep 12, 2024  3:01 PM Roselie BROCKS wrote: Reason for CRM: Patients wife called to inform there was a mistake on the refill for Morphine  Sulfate ER 200 MG TBCR [[id number]]. For the 200 MG , the patient takes 2 tablets 3 times a day for a total of 6 pills a day. The last refill 09-12-24 was wrote for 1 tablet 3 times a day. And the patient states that is incorrect. And needs the remaining tablets sent to  [facility] [place], [place] - [address] >> Sep 15, 2024  9:18 AM Deleta RAMAN wrote: Patient wife is calling to state the prescription is not correct. Spoke with pharmacy notified the 30 days supply will be the same as the 7 day supply. Please contact the patient. Aware of call back time. [phone]

## 2024-09-19 ENCOUNTER — Telehealth: Payer: Self-pay | Admitting: Internal Medicine

## 2024-09-19 ENCOUNTER — Other Ambulatory Visit: Payer: Self-pay | Admitting: Internal Medicine

## 2024-09-19 DIAGNOSIS — E1165 Type 2 diabetes mellitus with hyperglycemia: Secondary | ICD-10-CM

## 2024-09-19 NOTE — Telephone Encounter (Signed)
 MS Contin  200 mg 3 times a day is correct.  Thanks

## 2024-09-19 NOTE — Telephone Encounter (Signed)
 Copied from CRM #[id number]. Topic: Clinical - Prescription Issue >> Sep 19, 2024  4:57 PM Wess RAMAN wrote: Reason for CRM: Patient's wife, Barnie, stated she has been calling for 4 weeks for a refill of Morphine  Sulfate ER 200 MG TBCR. It should be 2 tablets, 3 times per day. Pharmacy has not received prescription  Callback #: [phone]  Pharmacy: [facility] [place], [place] - [address] [place] [address] Phone: [phone] Fax: [phone] Hours: Not open 24 hours

## 2024-09-19 NOTE — Telephone Encounter (Signed)
 Morphine  sulfate 200 mg 3 times a day is correct.  Thanks

## 2024-09-20 NOTE — Telephone Encounter (Signed)
 I spoke with the pts wife and was able to inform her that a PA is required for the pts  Morphine  Sulfate ER 200 MG TBCR ... As the pharmacy only submitted the PA for his 15mg  IR Morphine .

## 2024-09-21 ENCOUNTER — Other Ambulatory Visit (HOSPITAL_COMMUNITY): Payer: Self-pay

## 2024-09-21 ENCOUNTER — Telehealth: Payer: Self-pay

## 2024-09-21 NOTE — Telephone Encounter (Signed)
 Copied from CRM #[id number]. Topic: Clinical - Prescription Issue >> Sep 19, 2024  4:57 PM Wess RAMAN wrote: Reason for CRM: Patient's wife, Barnie, stated she has been calling for 4 weeks for a refill of Morphine  Sulfate ER 200 MG TBCR. It should be 2 tablets, 3 times per day. Pharmacy has not received prescription  Callback #: [phone]  Pharmacy: [facility] [place], [place] - [address] [place] [address] Phone: [phone] Fax: [phone] Hours: Not open 24 hours >> Sep 20, 2024  4:43 PM Winona R wrote: Pt wife requesting a call from [place].

## 2024-09-21 NOTE — Telephone Encounter (Addendum)
 I was able to speak with the pts wife and was able to inform her that a PA for the correct medication has been started as pt has two different dosage for the same medication.. the PA for the 15mg  dosage was done by pharmacy and they kept submitting the PA for that one and not the Morphine  Sulfate ER 200 MG TBCR. It should be 2 tablets, 3 times per day... I have sent a HP message to our PA team in regards to the 200 mg dosage needing a PA.

## 2024-09-21 NOTE — Telephone Encounter (Signed)
 Pt has been on the Morphine  Sulfate ER 200 MG TBCR for years as this PA is for continuation of care with medication and not a new start of medication. Pt has been out of meds and had to pay OOP for a 7 day supply until PA is done for pts regular 30 supply at 90 tablets due to taking medicaiton 3x a day for  Chronic pain disorder G89.  And  Low back pain radiating to both legs M54.50, M79.604, M79.605

## 2024-09-22 ENCOUNTER — Telehealth: Payer: Self-pay | Admitting: Pharmacy Technician

## 2024-09-22 ENCOUNTER — Other Ambulatory Visit (HOSPITAL_COMMUNITY): Payer: Self-pay

## 2024-09-22 NOTE — Telephone Encounter (Signed)
"  A user error has taken place: orders placed in error, not carried out on this patient.    "

## 2024-09-22 NOTE — Telephone Encounter (Signed)
 Pharmacy Patient Advocate Encounter  Received notification from [facility] that Prior Authorization for Morphine  Sulfate ER 200MG  er tablets has been DENIED.  Full denial letter will be uploaded to the media tab. See denial reason below.   PA #/Case ID/Reference #: [id number]

## 2024-09-23 NOTE — Telephone Encounter (Signed)
 Noted. Thanks.

## 2024-09-28 NOTE — Telephone Encounter (Signed)
 Addressed in other encounter.

## 2024-10-18 ENCOUNTER — Ambulatory Visit: Admitting: Internal Medicine

## 2024-12-27 ENCOUNTER — Ambulatory Visit: Admitting: Internal Medicine
